# Patient Record
Sex: Female | Born: 1940 | Race: White | Hispanic: No | Marital: Single | Smoking: Former smoker
Health system: Southern US, Community
[De-identification: ages and names within clinical notes are randomized; demographics above are authoritative.]

## PROBLEM LIST (undated history)

## (undated) DIAGNOSIS — I10 Essential (primary) hypertension: Secondary | ICD-10-CM

## (undated) DIAGNOSIS — E039 Hypothyroidism, unspecified: Secondary | ICD-10-CM

## (undated) DIAGNOSIS — J449 Chronic obstructive pulmonary disease, unspecified: Secondary | ICD-10-CM

## (undated) DIAGNOSIS — E119 Type 2 diabetes mellitus without complications: Secondary | ICD-10-CM

## (undated) DIAGNOSIS — I480 Paroxysmal atrial fibrillation: Secondary | ICD-10-CM

## (undated) DIAGNOSIS — I5032 Chronic diastolic (congestive) heart failure: Secondary | ICD-10-CM

## (undated) DIAGNOSIS — E785 Hyperlipidemia, unspecified: Secondary | ICD-10-CM

## (undated) DIAGNOSIS — I272 Pulmonary hypertension, unspecified: Secondary | ICD-10-CM

## (undated) HISTORY — PX: CHOLECYSTECTOMY: SHX55

## (undated) HISTORY — PX: APPENDECTOMY: SHX54

---

## 2006-04-21 ENCOUNTER — Inpatient Hospital Stay: Payer: Self-pay | Admitting: Internal Medicine

## 2006-04-21 ENCOUNTER — Other Ambulatory Visit: Payer: Self-pay

## 2006-04-22 ENCOUNTER — Other Ambulatory Visit: Payer: Self-pay

## 2007-07-22 ENCOUNTER — Other Ambulatory Visit: Payer: Self-pay

## 2007-07-22 ENCOUNTER — Inpatient Hospital Stay: Payer: Self-pay | Admitting: Internal Medicine

## 2007-08-03 ENCOUNTER — Ambulatory Visit: Payer: Self-pay | Admitting: Family Medicine

## 2010-09-10 ENCOUNTER — Emergency Department: Payer: Self-pay | Admitting: Emergency Medicine

## 2011-11-03 ENCOUNTER — Inpatient Hospital Stay: Payer: Self-pay | Admitting: Specialist

## 2011-11-03 LAB — T4, FREE: Free Thyroxine: 1.24 ng/dL (ref 0.76–1.46)

## 2011-11-03 LAB — COMPREHENSIVE METABOLIC PANEL
Albumin: 3 g/dL — ABNORMAL LOW (ref 3.4–5.0)
Anion Gap: 7 (ref 7–16)
Calcium, Total: 8.2 mg/dL — ABNORMAL LOW (ref 8.5–10.1)
Co2: 33 mmol/L — ABNORMAL HIGH (ref 21–32)
Creatinine: 0.86 mg/dL (ref 0.60–1.30)
EGFR (African American): >60
Glucose: 122 mg/dL — ABNORMAL HIGH (ref 65–99)
SGOT(AST): 19 U/L (ref 15–37)
SGPT (ALT): 14 U/L

## 2011-11-03 LAB — CBC
MCH: 28.5 pg (ref 26.0–34.0)
MCHC: 31.6 g/dL — ABNORMAL LOW (ref 32.0–36.0)
RBC: 5.21 10*6/uL — ABNORMAL HIGH (ref 3.80–5.20)
RDW: 15 % — ABNORMAL HIGH (ref 11.5–14.5)
WBC: 5.8 10*3/uL (ref 3.6–11.0)

## 2011-11-03 LAB — PRO B NATRIURETIC PEPTIDE: B-Type Natriuretic Peptide: 2663 pg/mL — ABNORMAL HIGH (ref 0–125)

## 2011-11-03 LAB — TROPONIN I: Troponin-I: <0.02 ng/mL

## 2011-11-03 LAB — TSH: Thyroid Stimulating Horm: 0.209 u[IU]/mL — ABNORMAL LOW

## 2011-11-04 LAB — BASIC METABOLIC PANEL
Anion Gap: 4 — ABNORMAL LOW (ref 7–16)
Calcium, Total: 8.3 mg/dL — ABNORMAL LOW (ref 8.5–10.1)
EGFR (African American): >60
EGFR (Non-African Amer.): >60
Glucose: 136 mg/dL — ABNORMAL HIGH (ref 65–99)
Osmolality: 284 (ref 275–301)
Potassium: 4.3 mmol/L (ref 3.5–5.1)
Sodium: 142 mmol/L (ref 136–145)

## 2011-11-04 LAB — CK TOTAL AND CKMB (NOT AT ARMC)
CK, Total: 33 U/L (ref 21–215)
CK, Total: 41 U/L (ref 21–215)
CK-MB: 1.5 ng/mL (ref 0.5–3.6)
CK-MB: 1.5 ng/mL (ref 0.5–3.6)

## 2011-11-04 LAB — TROPONIN I: Troponin-I: <0.02 ng/mL

## 2011-11-05 LAB — BASIC METABOLIC PANEL
BUN: 15 mg/dL (ref 7–18)
Calcium, Total: 8.4 mg/dL — ABNORMAL LOW (ref 8.5–10.1)
Chloride: 98 mmol/L (ref 98–107)
Co2: 39 mmol/L — ABNORMAL HIGH (ref 21–32)
EGFR (Non-African Amer.): >60

## 2011-11-09 LAB — CULTURE, BLOOD (SINGLE)

## 2012-09-28 ENCOUNTER — Ambulatory Visit: Payer: Self-pay | Admitting: Family Medicine

## 2013-07-24 ENCOUNTER — Inpatient Hospital Stay: Payer: Self-pay | Admitting: Orthopedic Surgery

## 2013-07-24 LAB — CBC
HCT: 38.6 % (ref 35.0–47.0)
HGB: 11.9 g/dL — ABNORMAL LOW (ref 12.0–16.0)
MCH: 26.6 pg (ref 26.0–34.0)
MCHC: 31 g/dL — ABNORMAL LOW (ref 32.0–36.0)
MCV: 86 fL (ref 80–100)
Platelet: 274 10*3/uL (ref 150–440)
RBC: 4.49 10*6/uL (ref 3.80–5.20)
RDW: 14.1 % (ref 11.5–14.5)
WBC: 10.3 10*3/uL (ref 3.6–11.0)

## 2013-07-24 LAB — COMPREHENSIVE METABOLIC PANEL
ALT: 15 U/L (ref 12–78)
ANION GAP: 2 — AB (ref 7–16)
AST: 22 U/L (ref 15–37)
Albumin: 2.8 g/dL — ABNORMAL LOW (ref 3.4–5.0)
Alkaline Phosphatase: 69 U/L
BILIRUBIN TOTAL: 0.6 mg/dL (ref 0.2–1.0)
BUN: 8 mg/dL (ref 7–18)
CHLORIDE: 104 mmol/L (ref 98–107)
CO2: 34 mmol/L — AB (ref 21–32)
Calcium, Total: 8.3 mg/dL — ABNORMAL LOW (ref 8.5–10.1)
Creatinine: 0.76 mg/dL (ref 0.60–1.30)
EGFR (Non-African Amer.): >60
GLUCOSE: 138 mg/dL — AB (ref 65–99)
OSMOLALITY: 280 (ref 275–301)
Potassium: 3.7 mmol/L (ref 3.5–5.1)
Sodium: 140 mmol/L (ref 136–145)
TOTAL PROTEIN: 6.3 g/dL — AB (ref 6.4–8.2)

## 2013-07-24 LAB — URINALYSIS, COMPLETE
Bacteria: NONE SEEN
Bilirubin,UR: NEGATIVE
Blood: NEGATIVE
Glucose,UR: NEGATIVE mg/dL (ref 0–75)
Hyaline Cast: 6
LEUKOCYTE ESTERASE: NEGATIVE
Nitrite: NEGATIVE
Ph: 6 (ref 4.5–8.0)
Protein: 30
RBC,UR: <1 /HPF (ref 0–5)
SPECIFIC GRAVITY: 1.01 (ref 1.003–1.030)
Squamous Epithelial: NONE SEEN
WBC UR: <1 /HPF (ref 0–5)

## 2013-07-24 LAB — PROTIME-INR
INR: 1.2
PROTHROMBIN TIME: 15.1 s — AB (ref 11.5–14.7)

## 2013-07-24 LAB — APTT: ACTIVATED PTT: 28.1 s (ref 23.6–35.9)

## 2013-07-26 LAB — CBC WITH DIFFERENTIAL/PLATELET
BASOS ABS: 0 10*3/uL (ref 0.0–0.1)
Basophil %: 0.1 %
EOS ABS: 0 10*3/uL (ref 0.0–0.7)
EOS PCT: 0 %
HCT: 26.4 % — AB (ref 35.0–47.0)
HGB: 8.1 g/dL — AB (ref 12.0–16.0)
LYMPHS PCT: 9.5 %
Lymphocyte #: 0.5 10*3/uL — ABNORMAL LOW (ref 1.0–3.6)
MCH: 26.6 pg (ref 26.0–34.0)
MCHC: 30.7 g/dL — ABNORMAL LOW (ref 32.0–36.0)
MCV: 87 fL (ref 80–100)
MONO ABS: 0.3 x10 3/mm (ref 0.2–0.9)
Monocyte %: 4.7 %
NEUTROS ABS: 4.9 10*3/uL (ref 1.4–6.5)
NEUTROS PCT: 85.7 %
Platelet: 167 10*3/uL (ref 150–440)
RBC: 3.05 10*6/uL — ABNORMAL LOW (ref 3.80–5.20)
RDW: 13.9 % (ref 11.5–14.5)
WBC: 5.7 10*3/uL (ref 3.6–11.0)

## 2013-07-26 LAB — HEMOGLOBIN: HGB: 8.9 g/dL — ABNORMAL LOW (ref 12.0–16.0)

## 2013-07-26 LAB — BASIC METABOLIC PANEL
Anion Gap: 3 — ABNORMAL LOW (ref 7–16)
BUN: 11 mg/dL (ref 7–18)
CO2: 30 mmol/L (ref 21–32)
Calcium, Total: 7.3 mg/dL — ABNORMAL LOW (ref 8.5–10.1)
Chloride: 112 mmol/L — ABNORMAL HIGH (ref 98–107)
Creatinine: 0.59 mg/dL — ABNORMAL LOW (ref 0.60–1.30)
EGFR (African American): >60
EGFR (Non-African Amer.): >60
GLUCOSE: 158 mg/dL — AB (ref 65–99)
OSMOLALITY: 291 (ref 275–301)
Potassium: 4.9 mmol/L (ref 3.5–5.1)
Sodium: 145 mmol/L (ref 136–145)

## 2013-07-27 LAB — BASIC METABOLIC PANEL
Anion Gap: 1 — ABNORMAL LOW (ref 7–16)
BUN: 15 mg/dL (ref 7–18)
CALCIUM: 7.4 mg/dL — AB (ref 8.5–10.1)
CHLORIDE: 114 mmol/L — AB (ref 98–107)
Co2: 29 mmol/L (ref 21–32)
Creatinine: 0.59 mg/dL — ABNORMAL LOW (ref 0.60–1.30)
EGFR (Non-African Amer.): >60
Glucose: 89 mg/dL (ref 65–99)
OSMOLALITY: 287 (ref 275–301)
POTASSIUM: 4.8 mmol/L (ref 3.5–5.1)
SODIUM: 144 mmol/L (ref 136–145)

## 2013-07-27 LAB — CBC WITH DIFFERENTIAL/PLATELET
BASOS ABS: 0 10*3/uL (ref 0.0–0.1)
Basophil %: 0.2 %
EOS PCT: 0.1 %
Eosinophil #: 0 10*3/uL (ref 0.0–0.7)
HCT: 24.7 % — AB (ref 35.0–47.0)
HGB: 7.9 g/dL — ABNORMAL LOW (ref 12.0–16.0)
LYMPHS PCT: 16.9 %
Lymphocyte #: 1 10*3/uL (ref 1.0–3.6)
MCH: 27.2 pg (ref 26.0–34.0)
MCHC: 31.9 g/dL — AB (ref 32.0–36.0)
MCV: 85 fL (ref 80–100)
Monocyte #: 0.5 x10 3/mm (ref 0.2–0.9)
Monocyte %: 8 %
Neutrophil #: 4.5 10*3/uL (ref 1.4–6.5)
Neutrophil %: 74.8 %
PLATELETS: 159 10*3/uL (ref 150–440)
RBC: 2.9 10*6/uL — ABNORMAL LOW (ref 3.80–5.20)
RDW: 14.2 % (ref 11.5–14.5)
WBC: 6 10*3/uL (ref 3.6–11.0)

## 2013-07-28 LAB — CBC WITH DIFFERENTIAL/PLATELET
BASOS PCT: 0.2 %
Basophil #: 0 10*3/uL (ref 0.0–0.1)
EOS PCT: 0.1 %
Eosinophil #: 0 10*3/uL (ref 0.0–0.7)
HCT: 29.3 % — ABNORMAL LOW (ref 35.0–47.0)
HGB: 9.4 g/dL — ABNORMAL LOW (ref 12.0–16.0)
LYMPHS PCT: 14.5 %
Lymphocyte #: 0.9 10*3/uL — ABNORMAL LOW (ref 1.0–3.6)
MCH: 27.7 pg (ref 26.0–34.0)
MCHC: 32.1 g/dL (ref 32.0–36.0)
MCV: 86 fL (ref 80–100)
MONOS PCT: 8.9 %
Monocyte #: 0.5 x10 3/mm (ref 0.2–0.9)
NEUTROS PCT: 76.3 %
Neutrophil #: 4.7 10*3/uL (ref 1.4–6.5)
Platelet: 180 10*3/uL (ref 150–440)
RBC: 3.39 10*6/uL — ABNORMAL LOW (ref 3.80–5.20)
RDW: 14.3 % (ref 11.5–14.5)
WBC: 6.1 10*3/uL (ref 3.6–11.0)

## 2013-07-29 LAB — EXPECTORATED SPUTUM ASSESSMENT W REFEX TO RESP CULTURE

## 2013-12-07 ENCOUNTER — Ambulatory Visit: Payer: Self-pay | Admitting: Family Medicine

## 2014-01-23 ENCOUNTER — Ambulatory Visit: Payer: Self-pay | Admitting: Otolaryngology

## 2014-01-23 LAB — BASIC METABOLIC PANEL
Anion Gap: 3 — ABNORMAL LOW (ref 7–16)
BUN: 13 mg/dL (ref 7–18)
CALCIUM: 8.8 mg/dL (ref 8.5–10.1)
CHLORIDE: 105 mmol/L (ref 98–107)
CO2: 33 mmol/L — AB (ref 21–32)
CREATININE: 0.64 mg/dL (ref 0.60–1.30)
EGFR (African American): >60
Glucose: 146 mg/dL — ABNORMAL HIGH (ref 65–99)
Osmolality: 284 (ref 275–301)
Potassium: 3.7 mmol/L (ref 3.5–5.1)
SODIUM: 141 mmol/L (ref 136–145)

## 2014-02-05 ENCOUNTER — Ambulatory Visit: Payer: Self-pay | Admitting: Otolaryngology

## 2014-02-05 LAB — CBC WITH DIFFERENTIAL/PLATELET
Basophil #: 0 10*3/uL (ref 0.0–0.1)
Basophil %: 0.4 %
EOS ABS: 0.1 10*3/uL (ref 0.0–0.7)
EOS PCT: 0.7 %
HCT: 33.5 % — ABNORMAL LOW (ref 35.0–47.0)
HGB: 10.7 g/dL — ABNORMAL LOW (ref 12.0–16.0)
LYMPHS ABS: 2.1 10*3/uL (ref 1.0–3.6)
LYMPHS PCT: 23.8 %
MCH: 27.9 pg (ref 26.0–34.0)
MCHC: 32 g/dL (ref 32.0–36.0)
MCV: 87 fL (ref 80–100)
Monocyte #: 0.5 x10 3/mm (ref 0.2–0.9)
Monocyte %: 6 %
Neutrophil #: 6.1 10*3/uL (ref 1.4–6.5)
Neutrophil %: 69.1 %
Platelet: 203 10*3/uL (ref 150–440)
RBC: 3.85 10*6/uL (ref 3.80–5.20)
RDW: 12.5 % (ref 11.5–14.5)
WBC: 8.8 10*3/uL (ref 3.6–11.0)

## 2014-02-05 LAB — BASIC METABOLIC PANEL
ANION GAP: 4 — AB (ref 7–16)
BUN: 18 mg/dL (ref 7–18)
CALCIUM: 7.9 mg/dL — AB (ref 8.5–10.1)
Chloride: 113 mmol/L — ABNORMAL HIGH (ref 98–107)
Co2: 29 mmol/L (ref 21–32)
Creatinine: 0.77 mg/dL (ref 0.60–1.30)
EGFR (Non-African Amer.): >60
Glucose: 157 mg/dL — ABNORMAL HIGH (ref 65–99)
Osmolality: 296 (ref 275–301)
Potassium: 4.4 mmol/L (ref 3.5–5.1)
Sodium: 146 mmol/L — ABNORMAL HIGH (ref 136–145)

## 2014-02-06 LAB — CBC WITH DIFFERENTIAL/PLATELET
Basophil #: 0 10*3/uL (ref 0.0–0.1)
Basophil %: 0.6 %
Eosinophil #: 0 10*3/uL (ref 0.0–0.7)
Eosinophil %: 0.5 %
HCT: 30 % — ABNORMAL LOW (ref 35.0–47.0)
HGB: 9.8 g/dL — ABNORMAL LOW (ref 12.0–16.0)
Lymphocyte #: 1.4 10*3/uL (ref 1.0–3.6)
Lymphocyte %: 18.1 %
MCH: 28.3 pg (ref 26.0–34.0)
MCHC: 32.6 g/dL (ref 32.0–36.0)
MCV: 87 fL (ref 80–100)
Monocyte #: 0.5 x10 3/mm (ref 0.2–0.9)
Monocyte %: 7.1 %
Neutrophil #: 5.5 10*3/uL (ref 1.4–6.5)
Neutrophil %: 73.7 %
Platelet: 196 10*3/uL (ref 150–440)
RBC: 3.45 10*6/uL — ABNORMAL LOW (ref 3.80–5.20)
RDW: 12.2 % (ref 11.5–14.5)
WBC: 7.5 10*3/uL (ref 3.6–11.0)

## 2014-02-06 LAB — CALCIUM: CALCIUM: 7.1 mg/dL — AB (ref 8.5–10.1)

## 2014-04-03 LAB — PATHOLOGY REPORT

## 2014-10-13 NOTE — Discharge Summary (Signed)
PATIENT NAME:  Sheila Hayes, Sheila Hayes MR#:  [id number] DATE OF BIRTH:  1941-04-07  DATE OF ADMISSION:  07/24/2013 DATE OF DISCHARGE: 07/31/2013   ADMITTING DIAGNOSIS: Right supracondylar femur fracture.   DISCHARGE DIAGNOSIS: Right supracondylar femur fracture  OPERATION: On 07/25/2013, she underwent retrograde nail fixation of the right supracondylar distal femur fracture.   SURGEON: Murlean HarkShalini Ramasunder, M.D.   ANESTHESIA: Spinal, and then general anesthesia.   COMPLICATIONS: None.   IMPLANTS: Biomet Trauma Phoenix retrograde nail 10.5 mm x 340 mm.   DISPOSITION: The patient was intubated during the last 1 hour of the case due to her extensive emphysema. She was unable to be immediately extubated and was transferred to the CCU for postoperative care.   HISTORY: Sheila Hayes is a 74 year old female, who on 07/24/2013, fell while walking out of the office for her rental home. She had severe pain. She presented to [facility] the same day where x-rays confirmed a right supracondylar femur fracture. Orthopedics and medicine were consulted. Medicine cleared the patient for surgery.   PHYSICAL EXAMINATION:  GENERAL: Well-developed in no acute distress.  HEENT: Pink conjunctivae. Hearing intact to voice, dry oral mucosa. Poor dentition.  NECK: Supple.  RESPIRATORY: Normal respiratory effort. Clear breath sounds.  CARDIOVASCULAR: Regular rate and rhythm. Lower extremity edema present.  ABDOMEN: No tenderness. No flank tenderness. Soft. Hyperactive bowel sounds.  GENITOURINARY: Clear yellow urine draining.  LYMPHATICS: Negative neck.  EXTREMITIES: Negative cyanosis and clubbing. Positive edema. Right knee with effusion and deformity of the distal femur. An abrasion on the anterior lateral proximal tibia.  SKIN: Normal to palpation. No rashes. Skin turgor poor. Abrasion on the proximal tibia.  NEUROLOGIC: Follows commands. Sensation intact to SPN, DPN, tibial nerve intact. DP bounding. PT +1.   PSYCHIATRIC: Alert and oriented to time, place, person. Good insight.   HOSPITAL COURSE: After initial admission on 07/24/2013, the patient was cleared by medicine and had surgery on 07/25/2013. She was unable to be extubated and was transported to the CCU for recovery. On postoperative day 1, 07/26/2013, she was hypotensive and in the CCU intubated. She was given 2 L normal saline. T-max 99.0. Hemoglobin 8.1 and hematocrit 26.4. Physical therapy was on hold due to patient's critical condition. On postoperative day 2,  07/27/2013, she continued to be in the CCU, hypotensive and intubated. She was given 1 unit of PRBC. Hemoglobin 8.9 and hematocrit 27.4. She had a low urine output. On postoperative day 3, 07/28/2013, she was extubated the previous day. She was on 4 Ls nasal cannula, but still in the CCU. Hemoglobin and hematocrit 9.4 and 29.3. Urine output was improved. On postoperative day 4, 07/29/2013, she was transferred to the floor. She was on 3 L nasal cannula. T-max 99.0. She had a bowel movement. Oxygen saturation was 91% to 99%. Physical therapy was resumed, although she did not tolerate this. On 07/30/2013, postoperative day 5, she was on 2.5 L nasal cannula. She continued physical therapy and had no acute events and she had slow progress with physical therapy. On postoperative day 6, 07/31/2013, she is stable and ready for discharge. Medicine has cleared her for discharge.   CONDITION AT DISCHARGE: Stable.   DISPOSITION: The patient was sent to rehab.   DISCHARGE INSTRUCTIONS: The patient will follow up [facility] orthopedics in 2 weeks for staple removal. She will do physical therapy and it should be nonweightbearing on the right lower extremity. She will have a diabetic diet. TED hose knee-high left lower extremity. She has  a postoperative hinged knee brace, which to be locked in extension at all times. This may be taken off for dressing changes. Her dressing can be changed once daily  on as-needed basis. She also has Polar Care and that should be on the knee at all times. Please see discharge instructions for a complete list of discharge medications.   ____________________________ Shunda Rabadi M. Haskel Khan, NP amb:aw D: 07/31/2013 08:33:57 ET T: 07/31/2013 08:49:21 ET JOB#: [id number]  cc: Lance Huaracha M. Haskel Khan, NP, <Dictator> Burt Ek Makale Pindell FNP ELECTRONICALLY SIGNED 08/21/2013 8:36

## 2014-10-13 NOTE — H&P (Signed)
Subjective/Chief Complaint Right leg pain   History of Present Illness Patient was walking out of the office for her rental home and slipped.  Felt severe pain before she landed on ground.  Presented to ED with severe pain.  Denies other injury.  Denies loss of consciousness.  Denies numbness or tingling.  Smokes 1/2 ppd   Past Med/Surgical Hx:  Diabetes:   Emphysema:   Tonsillectomy and Adenoidectomy:   Cholecystectomy:   Cataract Extraction:   Appendectomy:   ALLERGIES:  No Known Allergies:   HOME MEDICATIONS: Medication Instructions Status  aspirin 81 mg oral tablet 1 tab(s) orally once a day Active  Advair Diskus  inhaled  Active   Family and Social History:  Family History Non-Contributory   Social History lives alone with first floor set up.  Independent ambulator without walker or cane.   Place of Living Home   Review of Systems:  Subjective/Chief Complaint right leg pain   Fever/Chills No   Cough No   Sputum No   Abdominal Pain No   Diarrhea No   Constipation No   Nausea/Vomiting No   SOB/DOE No   Chest Pain No   Dysuria No   Medications/Allergies Reviewed Medications/Allergies reviewed   Physical Exam:  GEN well developed, no acute distress   HEENT pink conjunctivae, hearing intact to voice, dry oral mucosa, poor dentition   NECK supple   RESP normal resp effort  clear BS   CARD regular rate  no murmur  LE edema present   ABD denies tenderness  denies Flank Tenderness  soft  hyperactive BS   GU clear yellow urine draining   LYMPH negative neck   EXTR negative cyanosis/clubbing, positive edema, right knee with effusion, deformity distal femur. Abrasion antero-lateral proximal tibia   SKIN normal to palpation, No rashes, skin turgor poor, abrasion prox tibia   NEURO follows commands, Sensation SPN, DPN, tib nerve intact.  DP bounding.  PT 1+.   PSYCH alert, A+O to time, place, person, good insight   Lab Results:  Hepatic:   02-Feb-15 11:52   Bilirubin, Total 0.6  Alkaline Phosphatase 69 (45-117 NOTE: New Reference Range 05/12/13)  SGPT (ALT) 15  SGOT (AST) 22  Total Protein, Serum  6.3  Albumin, Serum  2.8  Routine BB:  02-Feb-15 11:52   ABO Group + Rh Type -  Antibody Screen -    12:13   ABO Group + Rh Type A Negative  Antibody Screen NEGATIVE (Result(s) reported on 24 Jul 2013 at 01:32PM.)  Routine Chem:  02-Feb-15 11:52   Glucose, Serum  138  BUN 8  Creatinine (comp) 0.76  Sodium, Serum 140  Potassium, Serum 3.7  Chloride, Serum 104  CO2, Serum  34  Calcium (Total), Serum  8.3  Osmolality (calc) 280  eGFR (African American) >60  eGFR (Non-African American) >60 (eGFR values <22mL/min/1.73 m2 may be an indication of chronic kidney disease (CKD). Calculated eGFR is useful in patients with stable renal function. The eGFR calculation will not be reliable in acutely ill patients when serum creatinine is changing rapidly. It is not useful in  patients on dialysis. The eGFR calculation may not be applicable to patients at the low and high extremes of body sizes, pregnant women, and vegetarians.)  Anion Gap  2  Result Comment TYPE AND SCREEN - NO SPECIMEN RECEIVED. REORDERED. MPG  Result(s) reported on 24 Jul 2013 at 12:10PM.  Routine UA:  02-Feb-15 11:52   Color (UA) Yellow  Clarity (  UA) Clear  Glucose (UA) Negative  Bilirubin (UA) Negative  Ketones (UA) Trace  Specific Gravity (UA) 1.010  Blood (UA) Negative  pH (UA) 6.0  Protein (UA) 30 mg/dL  Nitrite (UA) Negative  Leukocyte Esterase (UA) Negative (Result(s) reported on 24 Jul 2013 at 01:14PM.)  RBC (UA) <1 /HPF  WBC (UA) <1 /HPF  Bacteria (UA) NONE SEEN  Epithelial Cells (UA) NONE SEEN  Hyaline Cast (UA) 6 /LPF (Result(s) reported on 24 Jul 2013 at 01:14PM.)  Routine Coag:  02-Feb-15 11:52   Prothrombin  15.1  INR 1.2 (INR reference interval applies to patients on anticoagulant therapy. A single INR therapeutic range for  coumarins is not optimal for all indications; however, the suggested range for most indications is 2.0 - 3.0. Exceptions to the INR Reference Range may include: Prosthetic heart valves, acute myocardial infarction, prevention of myocardial infarction, and combinations of aspirin and anticoagulant. The need for a higher or lower target INR must be assessed individually. Reference: The Pharmacology and Management of the Vitamin K  antagonists: the seventh ACCP Conference on Antithrombotic and Thrombolytic Therapy. TYYPE.9611 Sept:126 (3suppl): [id number]. A HCT value >55% may artifactually increase the PT.  In one study,  the increase was an average of 25%. Reference:  "Effect on Routine and Special Coagulation Testing Values of Citrate Anticoagulant Adjustment in Patients with High HCT Values." American Journal of Clinical Pathology 2006;126:400-405.)  Activated PTT (APTT) 28.1 (A HCT value >55% may artifactually increase the APTT. In one study, the increase was an average of 19%. Reference: "Effect on Routine and Special Coagulation Testing Values of Citrate Anticoagulant Adjustment in Patients with High HCT Values." American Journal of Clinical Pathology 2006;126:400-405.)  Routine Hem:  02-Feb-15 11:52   WBC (CBC) 10.3  RBC (CBC) 4.49  Hemoglobin (CBC)  11.9  Hematocrit (CBC) 38.6  Platelet Count (CBC) 274 (Result(s) reported on 24 Jul 2013 at 12:27PM.)  MCV 86  MCH 26.6  MCHC  31.0  RDW 14.1   Radiology Results:  XRay:    02-Feb-15 10:50, Knee Right Complete  Knee Right Complete  REASON FOR EXAM:    fall, dislocation or fracture  COMMENTS:       PROCEDURE: DXR - DXR KNEE RT COMP WITH OBLIQUES  - Jul 24 2013 10:50AM     CLINICAL DATA:  Status post fall.  Knee pain.    EXAM:  RIGHT KNEE - COMPLETE 4+ VIEW    COMPARISON:  None.    FINDINGS:  Markedly comminuted fracture of the distal femur noted, mainly  involving the distal diaphysis and metaphysis. No definite  extension  of fracture lines to the weight-bearing articular surfaces of thea  femoral condyles is identified. Thefracture line does appear to  extend to the anterior cortex of the distal femur, in the  patellofemoral compartment.    Chronic degenerative changes are seen in the knee with loss of joint  space in the medial compartment.     IMPRESSION:  Markedly comminuted distal femur fracture involving the distal  diaphysis and metaphysis. Fracture line does appear to extend to the  articular surface in the patellofemoral compartment.      Electronically Signed    By: Misty Stanley M.D.    On: 07/24/2013 11:27         Verified By: ERIC A. MANSELL, M.D.,    02-Feb-15 11:26, Chest Portable Single View  Chest Portable Single View  REASON FOR EXAM:    preop  COMMENTS:  PROCEDURE: DXR - DXR PORTABLE CHEST SINGLE VIEW  - Jul 24 2013 11:26AM     CLINICAL DATA:  Right lower leg pain after falling.    EXAM:  PORTABLE CHEST - 1 VIEW    COMPARISON:  11/03/2011.    FINDINGS:  1107hr. The positioning is more lordotic. Allowing for this, the  heart size and mediastinal contours are stable. The heart is mildly  enlarged. The lungs are clear. There is no pleural effusion or  pneumothorax. No acute osseous findings are seen.     IMPRESSION:  No active cardiopulmonary process.      Electronically Signed    By: Camie Patience M.D.    On: 07/24/2013 11:34         Verified By: Vivia Ewing, M.D.,    02-Feb-15 11:26, Femur Right  Femur Right  REASON FOR EXAM:    fall, twisted knee, non weight bearing  COMMENTS:       PROCEDURE: DXR - DXR FEMUR RIGHT  - Jul 24 2013 11:26AM     CLINICAL DATA:  Fall with pain.    EXAM:  RIGHT FEMUR - 2 VIEW    COMPARISON:  None.    FINDINGS:  Two view exam of the right femur shows a markedly comminuted distal  femur fracture involving the distal diaphysis with extension into  the metaphysis. There is apex posterior angulation.  Bones are  diffusely demineralized. Degenerative changes are noted at the knee.     IMPRESSION:  Comminuted distal femur fracture with apex posterior angulation.      Electronically Signed    By: Misty Stanley M.D.    On: 07/24/2013 11:32         Verified By: ERIC A. MANSELL, M.D.,    Assessment/Admission Diagnosis Distal femur fracture, severely comminuted and displaced.   Plan CT scan knee pending. Based on whether there is an articular component, will decide on IM nail vs. plate. Risks of surgery explained in detail to patient. NWB NPO after midnight. Plan for surgery tomorrow.   Electronic Signatures: Dawayne Patricia (MD)  (Signed [phone] 14:15)  Authored: CHIEF COMPLAINT and HISTORY, PAST MEDICAL/SURGIAL HISTORY, ALLERGIES, HOME MEDICATIONS, FAMILY AND SOCIAL HISTORY, REVIEW OF SYSTEMS, PHYSICAL EXAM, LABS, Radiology, ASSESSMENT AND PLAN   Last Updated: 02-Feb-15 14:15 by Dawayne Patricia (MD)

## 2014-10-13 NOTE — Op Note (Signed)
PATIENT NAME:  Sheila ModenaWHITFIELD, Harvest F MR#:  [id number] DATE OF BIRTH:  Nov 07, 1940  DATE OF PROCEDURE:  07/25/2013   PREOPERATIVE DIAGNOSIS: Right distal femur fracture, supracondylar, severely comminuted.   POSTOPERATIVE DIAGNOSIS:  Right distal femur fracture, supracondylar, severely comminuted.  PROCEDURE PERFORMED: Retrograde nail fixation, right supracondylar distal femur fracture.   SURGEON: Murlean HarkShalini Havana Baldwin, M.D.   ASSISTANT: None.   ANESTHESIA: Spinal anesthesia followed by general anesthesia.  SURGICAL FINDINGS: The patient is found to have severely osteoporotic bone. Supracondylar fracture was very severely comminuted. Lateral cortex was extensively comminuted with minimal bone integrity. 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   COMPLICATIONS: None.  IMPLANTS USED: Biomet Trauma Phoenix retrograde nail 10.5 mm x 340 mm.   DISPOSITION: The patient was intubated during the last 1 hour of the case.  Due to her extensive emphysema, she was unable to be immediately extubated and was therefore returned to the PCU for postoperative care.   INDICATIONS FOR PROCEDURE: Ms. Sheila Hayes is a 74 year old female. She lives independently. However, she states that she does not use any assistive device for walking. Per discussion with her family, they state that she was advised to use a walker, in the past, however, has not done so.  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 She states that she felt her leg break before she even fell to the ground. She was brought to the Emergency Room where she was admitted to the orthopedic service for care. Severely comminuted supracondylar distal femur fracture was noted. Decision was made to proceed with surgical intervention.  The patient was identified in the preoperative holding area. Spinal anesthesia was administered. Right lower extremity was marked as the operative site. The patient was brought  in the operating room and placed on the table in supine position. The right lower extremity was prepared and draped in the usual sterile fashion. With fluoroscopic assistance, the fracture was reduced. There is extensive comminution. Using a radiolucent triangle and a towel bump, adequate alignment of the fracture was able to be achieved. An anterior incision was made. Deep dissection is carried down using a standard medial parapatellar approach. The patellar tendon was retracted medially. This was protected throughout the case using a soft tissue protector. With fluoroscopic guidance, starting pin was inserted in the midline of the distal femur in line with Blumensaat's line. This was then overdrilled with an opening reamer using a soft tissue protector. Ball-tipped guidewire was inserted past the fracture line. A blocking screw was inserted in a distal fragment anterior to expected placement of the nail in order to prevent the distal fragment from falling into extension. The patient was found to have minimal lateral bone available. Hope was to be able to remove this blocking screw as there was minimal cortical fixation after nail placement was carried out and locked.  The femur was sequentially reamed to 12 mm. Reaming was carried out across the fracture site under direct fluoroscopic visualization to ensure that the canal is being centrally reamed given the severe instability of the fracture. At this time the femoral canal was measured to be 355 mm and, therefore, a decision was made to use a 340 mm nail. A 340 mm x 10.5 mm Phoenix retrograde nail was inserted. Using the distal screw insertion guide, 4 distal interlocking screws were placed in the distal fragment of the fracture to ensure as much stability as possible. With these 4 screws in place,  the blocking screw was able to be removed. Fracture alignment remained preserved with blocking screw removed. Decision was made to remove the blocking screw given  the lack of adequate cortical bite.   Attention was now turned to the proximal aspect of the femur. Using a standard perfect circle technique, an anterior to posterior proximal femoral interlocking screw was placed. This was confirmed on orthogonal imaging. At this time, final films were taken throughout the lower extremity. Adequate alignment was found. Leg lengths were clinically equal. Nail was well seated inside the distal femur and not prominent on palpation or on motion. All incisions were copiously irrigated. The retinaculum was approximated using 0 Vicryl suture. Subcutaneous tissue was closed using 2-0 Vicryl suture.  Skin was closed using staples. The patient was placed in a hinged knee brace locked in extension. She will be kept in extension for 2 weeks and then gentle range of motion in the brace will be started. The patient will be nonweightbearing.    ____________________________ Murlean Hark, MD sr:ce D: 07/28/2013 15:47:46 ET T: 07/28/2013 18:35:17 ET JOB#: [id number]  cc: Murlean Hark, MD, <Dictator> Murlean Hark MD ELECTRONICALLY SIGNED 08/24/2013 8:49

## 2014-10-13 NOTE — Op Note (Signed)
PATIENT NAME:  Sheila Hayes, Sheila Hayes MR#:  [id number] DATE OF BIRTH:  December 02, 1940  DATE OF PROCEDURE:  02/05/2014  PREOPERATIVE DIAGNOSES: 1.  Multinodular goiter. 2.  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   POSTOPERATIVE DIAGNOSES:  1.  Multinodular goiter. 2.  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   PROCEDURE PERFORMED: Total thyroidectomy with recurrent laryngeal nerve monitoring throughout the procedure.   SURGEON: Vernie Murders, MD  ASSISTANT: Linus Salmons, MD   ANESTHESIA: General.   COMPLICATIONS: None.   TOTAL ESTIMATED BLOOD LOSS: 750 mL.   DESCRIPTION OF PROCEDURE: The patient was given general anesthesia by oral endotracheal intubation. Several electrodes were placed on the oral endotracheal tube for monitoring of the recurrent laryngeal nerve. The electrodes were placed under direct visualization to give good contact on the vocal cords. Once she was intubated, a shoulder roll was placed and her neck was extended. She had an extremely large gland that was pushing out anteriorly in the neck, very wide. The left gland crossed midline and was approximately 7 inches in size on the CT scan. There were multiple nodules throughout and calcified areas throughout. Her neck was marked and then 1% Xylocaine with epinephrine 1:100,000 was infiltrated in the subcutaneous tissues; approximately 8 mL was used. She was prepped and draped in sterile fashion.   Incision was created through the skin and subcutaneous and down through the platysmal layer. There were 2 large anterior jugular veins that were crossclamped and tied near the midline. Strap muscles were divided in the midline and elevated and there was 1 large mass that looked like in the middle of the neck, however, this was part of the left thyroid gland. The strap muscles were pulled up, but I could not get around the gland well because  it was so large. The strap muscles were already stretched. The left strap muscles were divided to provide better visualization around the bottom of the large nodule. With finger dissection the capsule of the thyroid gland was freed up inferiorly as it went below the clavicle and then laterally and superiorly. Almost the entire left gland was this large nodule. There was very little normal gland left superiorly. The strap muscle was pulled on the right side to get around the front edge of the thyroid gland and we were able to deliver the entire large mass out the neck wound still with its attachments into the bottom of the neck. Inferiorly the vessels were cut with the Harmonic scalpel. This freed up the mass at the bottom of the trachea. Blunt dissection was used and the recurrent laryngeal nerve was found on the left side. Directly overlying it was a large parathyroid gland that appeared to be about 1 gram in size. I did not see another parathyroid gland on this side. The nerve was verified by stimulation and was tracked superiorly. Once this was freed up and I could see it lying in the bed, Berry's ligament was cut across and then the remaining vascular attachments superiorly and a small amount of normal thyroid gland was removed. The vessels were crossclamped and tied. There was a very large vessel coming in medially overlying the larynx and this was crossclamped and tied as well.   The right side was then visualized. There were several nodules that were overlying this and freed up. The recurrent laryngeal nerve was then found as the gland was rotated medially. It was deep in the bed,  appeared to be a parathyroid inferiorly, that was saved on this side as well to save both inferior parathyroid glands. The recurrent laryngeal nerve was traced up into the larynx and Berry's ligament and vessels were cut above this and the remaining attachments to the trachea were freed up from the gland. The gland and large mass  of nodules was tagged in the right superior pole for orientation in pathology.    The wound was copiously irrigated. On both sides there were small areas of bleeding that were cauterized. There was no significant bleeding then noted on either side. The trachea was now lying in the midline as it was no longer being pushed way off to the right side. Then two 7 mm Jackson-Pratt drains were placed, one on each side, crisscrossing in the midline. These were sutured to the skin with 3-0 nylon suture. The strap muscles were then repaired on the right side and then they were sutured in the midline to help close the deep layer. This was all done with interrupted 4-0 Vicryls. The platysmal layer was then closed with interrupted 4-0 Vicryls as well. The subcu was then closed with 4-0 Vicryls and the skin edges were held in apposition with a 6-0 nylon suture. Drains were placed to low continuous bulb suction. There was no significant bleeding anymore. There was some significant bleeding during the procedure while the large mass was being delivered and there were still multiple blood supply attachments. This total amount of blood loss was 750 mL, but the wound was nice and dry at the end of the procedure. She had 2 parathyroids that were left intact and the recurrent laryngeal nerves were stimulated at the end of case and had good function bilaterally.   The patient tolerated the procedure well. She was awakened and taken to the recovery room in satisfactory condition. There were no operative complications. This was an extremely large gland with a very large wound to be able to deliver it whole and have good visualization.  ____________________________ Sheila CopaPaul H. Jaquavius Hudler, MD phj:sb D: 02/05/2014 10:36:54 ET T: 02/05/2014 11:31:22 ET JOB#: [id number]  cc: Sheila CopaPaul H. Kella Splinter, MD, <Dictator> Sheila CopaPAUL H Ghazal Pevey MD ELECTRONICALLY SIGNED 02/08/2014 12:10

## 2014-10-13 NOTE — Consult Note (Signed)
PATIENT NAME:  Sheila Hayes, Sheila Hayes MR#:  [id number] DATE OF BIRTH:  [birth year]  DATE OF CONSULTATION:  07/24/2013  REFERRING PHYSICIAN:  Murlean HarkShalini Ramasunder, MD CONSULTING PHYSICIAN:  Shaune PollackQing Christne Platts, MD  PRIMARY CARE PHYSICIAN: Dr. Wallene Huharew.  REASON FOR CONSULTATION: Medical management.   HISTORY OF PRESENT ILLNESS: This is a 74 year old Caucasian female with a history of hypertension, obesity, CHF, diabetes, COPD, who was sent to ED due to fall today. The patient fell by accident at home and then developed right hip severe pain, unable to stand or move right lower extremity. The patient was diagnosed with right hip fracture. The patient denies any headache or dizziness. Denies any other symptoms. Denies any syncope, loss of consciousness.   PAST MEDICAL HISTORY: Hypertension, diabetes, congestive heart failure, obesity, COPD.   PAST SURGICAL HISTORY: Appendectomy, cholecystectomy, tonsillectomy,   FAMILY HISTORY: Both parents died of a heart attack.   REVIEW OF SYSTEMS:    CONSTITUTIONAL: The patient denies any fever or chills. No headache or dizziness. No weakness.  EYES: No double vision or blurry vision.  EARS, NOSE, THROAT: No postnasal drip, slurred speech, or dysphagia.  CARDIOVASCULAR: No chest pain, palpitations, orthopnea, or nocturnal dyspnea. No leg edema.  PULMONARY: No cough, sputum, shortness of breath or hemoptysis.  GASTROINTESTINAL: No abdominal pain, nausea, vomiting or diarrhea. No melena or bloody stool.  GENITOURINARY: No dysuria, hematuria or incontinence.  SKIN: No rash or jaundice.  NEUROLOGIC: No syncope, loss of consciousness or seizure.  HEMATOLOGIC: No easy bruising or bleeding.  ENDOCRINE: No polyuria, polydipsia, heat or cold tolerance.  MUSCULOSKELETAL: No edema, but has right hip pain.   ALLERGIES: No.  HOME MEDICATIONS:  1.  Aspirin 81 mg p.o. daily.  2.  Advair Diskus inhaled.   PHYSICAL EXAMINATION: VITAL SIGNS: Temperature 98.1, blood pressure 164/74,  pulse 99, O2 saturation 90% on oxygen.  GENERAL: The patient is alert, awake, mildly confused, possibly due to pain medication just given.  HEENT: Pupils round, equal, reactive to light and accommodation.  NECK: Supple. No JVD or carotid bruit. No lymphadenopathy. No thyromegaly.  CARDIOVASCULAR: S1, S2, regular rate and rhythm. No murmurs or gallops.  PULMONARY: Bilateral air entry. No wheezing or rales. No use of accessory muscles to breathe.  ABDOMEN: Soft. No distention or tenderness. No organomegaly. Bowel sounds present.  EXTREMITIES: No edema, clubbing or cyanosis. No calf tenderness. Right lower extremity deformity, on fixation.  SKIN: No rash or jaundice.  NEUROLOGIC: The patient is alert, awake, confused, unable to examine at this time.   LABORATORY AND RADIOLOGICAL DATA: CT of the right knee without contrast showed severe comminuted fracture of the distal femoral diaphysis.   Glucose 138, BUN 8, creatinine 0.76. Electrolytes are normal. WBC 10.3, hemoglobin 11.9, platelets 274. INR 1.2. Urinalysis is negative.   Chest x-ray: No acute cardiopulmonary process.   Right femur x-ray shows distal femur fracture.   IMPRESSIONS: 1.  Right hip fracture.  2.  Hypertension.  3.  Diabetes.  4.  Chronic obstructive pulmonary disease, emphysema.  5.  History of congestive heart failure.  RECOMMENDATIONS:  1.  The patient has moderate risk for hip fracture.  2.  We will recommend to hold aspirin for hip surgery, and then DVT prophylaxis after surgery.  3.  The patient has a history of COPD, emphysema. Needs nebulizer and spirometry.  4.  For diabetes, need to hold metformin and start sliding scale.  5.  Give IV fluid support.  6.  Follow up CBC.  7.  I discussed the patient's condition and plan of treatment with the patient.  CODE STATUS: The patient is a full code.   TIME SPENT: About 52 minutes.   ____________________________ Shaune Pollack, MD qc:jcm D: 07/24/2013 17:37:33  ET T: 07/24/2013 18:55:18 ET JOB#: [id number]  cc: Shaune Pollack, MD, <Dictator> Shaune Pollack MD ELECTRONICALLY SIGNED 07/26/2013 15:12

## 2014-10-14 NOTE — Consult Note (Signed)
Brief Consult Note: Diagnosis: SOB, likely combination of COPD/CHF and possible infectious process.   Comments: Patient with SOB, coughing, wheezing, PND, increased pedal edema. Had "cold" prior to symptom onset and h/o smoking. She has improved with diurectics, antibiotics. She is on Coreg, ACE-I, Lasix at this time. There has not been any CP or acute EKG changes and will await echo results and make further recommendations.  Electronic Signatures: Radene KneeKhan, Shaukat Ali (MD)   (Signed 16-May-13 09:00)  Co-Signer: Brief Consult Note Maia PlanManzi, Kaenan Jake A (PA-C)   (Signed 15-May-13 12:25)  Authored: Brief Consult Note  Last Updated: 16-May-13 09:00 by Radene KneeKhan, Shaukat Ali (MD)

## 2014-10-14 NOTE — Discharge Summary (Signed)
PATIENT NAME:  Sheila Hayes, Sheila Hayes MR#:  [id number] DATE OF BIRTH:  1941/03/13  DATE OF ADMISSION:  11/03/2011 DATE OF DISCHARGE:  11/05/2011  HISTORY AND PHYSICAL: For a detailed note, please take a look at the History and Physical done on admission by Dr. Enedina Finner.   DISCHARGE DIAGNOSES:  1. Acute respiratory failure likely secondary to a combination of chronic obstructive pulmonary disease and congestive heart failure.  2. Chronic obstructive pulmonary disease exacerbation secondary to ongoing tobacco abuse and acute bronchitis. 3. Congestive heart failure, acute on chronic diastolic dysfunction.  4. Diabetes.  5. Obesity.  6. Hypertension.  7. Medical noncompliance.   DIET: The patient is being discharged on a low-sodium, American Diabetic Association low-fat diet.   ACTIVITY: As tolerated.   FOLLOWUP:  1. Follow up with Dr. Adrian Blackwater in the next 1 to 2 weeks.  2. Follow up with Dr. Elizabeth Sauer in the next 1 to 2 weeks.    DISCHARGE MEDICATIONS:  1. Aspirin 81 mg daily.  2. Coreg 3.125 mg p.o. b.i.d.  3. Lisinopril 5 mg daily.  4. Lasix 20 mg b.i.d.  5. Potassium 20 mEq daily.  6. Prednisone taper starting at 50 mg, down to 10 mg over the next 5 days.  7. Levaquin 5 mg daily x5 days.  8. Metformin 500 mg b.i.d.  CONSULTANT: Dr. Adrian Blackwater from Cardiology.   PERTINENT HOSPITAL LABORATORY, DIAGNOSTIC AND RADIOLOGICAL DATA: Chest x-ray done on admission showing heterogeneous left lower lobe opacities, may represent infection. Small left pleural effusion.  Two-dimensional echocardiogram showing dilated left atrium, rest of the chambers normal size, mild MR and mild pulmonary hypertension. Ejection fraction 55%.   HOSPITAL COURSE: The patient is a 74 year old female with medical problems as mentioned above, presented to the hospital on 11/03/2011 secondary to acute respiratory failure and shortness of breath.   1. Acute respiratory failure: This was acute hypoxic  hypercarbic respiratory failure. This was likely secondary to a combination of underlying chronic obstructive pulmonary disease and congestive heart failure. The patient was started on IV Solu-Medrol, empiric antibiotics, also started on IV diuretics. After receiving two days of IV diuretic therapy and IV steroids, the patient's clinical symptoms have improved. The patient is about 8 liters negative since admission. She was ambulated on room air and did need home oxygen and was discharged on that.  2. Chronic obstructive pulmonary disease exacerbation: This was also the cause of the patient's respiratory failure. The patient was treated with IV steroids, around-the-clock nebulizer treatments, and empiric Levaquin. She currently is being discharged on a prednisone taper and Levaquin for a few days as stated. She was strongly advised to quit smoking and was maintained on a nicotine patch while she was here.  3. Congestive heart failure: This was likely acute on chronic diastolic dysfunction. The patient's echocardiogram showed an ejection fraction of 55%. She has underlying smoking and chronic obstructive pulmonary disease, therefore, has mild pulmonary hypertension on the echocardiogram too. She was diuresed well with IV Lasix. She is about 8 liters negative since admission. She clinically feels much better. Presently she is being discharged on p.o. beta blockers, ACE inhibitors, and low-dose diuretics and potassium supplements as mentioned.  4. Diabetes: The patient was noncompliant with her meds. Hemoglobin A1c was as high as 6.5. She was started on some metformin and placed on sliding scale insulin. For now, she is being discharged on just metformin with further titrations to be done as an outpatient.  5. Chronic obstructive pulmonary  disease: As mentioned, the patient did have a mild exacerbation due to acute bronchitis which was treated with IV steroids and empiric antibiotics. She did qualify for home  oxygen, which is being arranged for her.   CODE STATUS: The patient is a FULL CODE.  DISPOSITION: She is being discharged home with Home Health nursing and physical therapy services.   TIME SPENT: 40 minutes.  ____________________________ Rolly PancakeVivek J. Cherlynn KaiserSainani, MD vjs:cbb D: 11/05/2011 14:42:56 ET T: 11/06/2011 10:22:45 ET JOB#: [id number]  cc: Rolly PancakeVivek J. Cherlynn KaiserSainani, MD, <Dictator> Laurier NancyShaukat A. Khan, MD Duanne Limerickeanna C. Jones, MD Houston SirenVIVEK J Mishawn Didion MD ELECTRONICALLY SIGNED 11/12/2011 15:12

## 2014-10-14 NOTE — Consult Note (Signed)
PATIENT NAME:  Sheila ModenaWHITFIELD, Ala F MR#:  [id number] DATE OF BIRTH:  [birth year]  DATE OF CONSULTATION:  11/04/2011  REFERRING PHYSICIAN:  Enedina FinnerSona Patel, MD   CONSULTING PHYSICIAN:  Adrian BlackwaterShaukat Khan, MD/Isel Skufca Eloy EndA. Alley Neils, PA-C  PRIMARY CARE PHYSICIAN: Elizabeth Sauereanna Jones, MD   REASON FOR CONSULTATION: Shortness of breath, possible congestive heart failure.   HISTORY OF PRESENT ILLNESS: Ms. Velora MediateSarah Broden is an obese 74 year old white female who presented to the Emergency Department with increased shortness of breath. Prior to her admission, she had complained of cough and cold that she had "just got over." She did not have any associated fevers but had shortness of breath with minimal exertion, productive cough with yellow phlegm, wheezing and chest congestion. She does not have any exertional chest pain or chest pressure, however, she does have some discomfort and increased pressure sensation after eating or drinking something and denies any history of gastroesophageal reflux disease. She has one-pillow orthopnea, and over the last couple of weeks she has had paroxysmal nocturnal dyspnea. She has also noticed increased pedal edema of her legs and lower extremities over the last week. She denies any associated palpitations.   Of note, the patient was admitted in 2007 with congestive heart failure.   PAST MEDICAL HISTORY:  1. Diabetes mellitus, type 2, diet controlled.  2. History of congestive heart failure in 2007.  3. Emphysema with ongoing tobacco abuse.  4. History of goiter.   PAST SURGICAL HISTORY:  1. Appendectomy. 2. Cholecystectomy. 3. Tonsillectomy.   ALLERGIES: No known drug allergies.   MEDICATIONS: No home medications.  SOCIAL HISTORY: The patient smokes 1/2 pack per day and is trying to quit. She denies any  alcohol or illicit drug use. She drinks about four cups of coffee per day.   FAMILY HISTORY: Both her parents died of heart attack, hypertension in her brothers, diabetes mellitus in  her brother.   REVIEW OF SYSTEMS: The patient complains of fatigue, weakness. Denies any blurry vision. Symptoms positive for coughing, wheezing, shortness of breath. No chest pain, no nausea, vomiting, diarrhea, or abdominal pain.   PHYSICAL EXAMINATION:  GENERAL: This is an obese female who is not in any acute distress. She is on nasal cannula. Currently she is alert and oriented x3.   VITAL SIGNS: Temperature is 98.7 degrees Fahrenheit, heart rate 74, respiratory rate 18, blood pressure  130/74, oxygen saturation is 96% on 4 L/min nasal cannula.   HEENT: Head: Atraumatic, normocephalic. Eyes: Pupils are round and equal, bilaterally reactive to light. No scleral icterus. Conjunctivae are pale pink. Ears and nose: Normal to external inspection. Mouth: Moist mucous membranes.   NECK: Large goiter present. Trachea is midline.   NECK: Supple.   LUNGS: Decreased breath sounds in the bases, scattered wheezing. No audible crackles heard.   CARDIOVASCULAR: Regular rate and rhythm. No murmurs, rubs, or gallops appreciated.   ABDOMEN: Nondistended. Bowel sounds are present in all four quadrants. There is no rebound, tenderness, guarding or peritoneal signs.   EXTREMITIES: 1+ pedal edema bilaterally.    ASSESSMENT/PLAN:  1. Respiratory failure: Likely secondary to obstructive pulmonary disease/emphysema and congestive heart failure. The patient denies any chest pain at this time, and EKG was with no acute abnormalities. She is improving with diuresis. She is also on carvedilol and ACE inhibitor. Echocardiogram has been ordered, and we will make further recommendations based on those results.  2. Chronic obstructive pulmonary disease exacerbation: The patient has had recent cold and is being treated with empiric antibiotics for  possible infectious process. She had been weaned from BiPAP to nasal cannula.  3. Bilateral lower extremity edema with mild erythema: The patient is already on antibiotics.   4. Goiter with subclinical hyperthyroidism: Being followed by hospitalist.  5. Diabetes mellitus, type 2: The patient is on sliding scale insulin.  6. We will continue to follow this patient with you.   Thank you very much for this consultation and allowing [place] to participate in this patient's care.   ____________________________ Verta Ellen, PA-C mam:cbb D: 11/04/2011 12:37:52 ET T: 11/04/2011 13:11:48 ET JOB#: [id number]  cc: Verta Ellen, PA-C, <Dictator> Duanne Limerick, MD Mazi Brailsford A Lillyahna Hemberger PA ELECTRONICALLY SIGNED 11/05/2011 8:05

## 2014-10-14 NOTE — H&P (Signed)
PATIENT NAME:  Sheila Hayes, Sheila Hayes MR#:  [id number] DATE OF BIRTH:  09/09/1940  DATE OF ADMISSION:  11/03/2011  PRIMARY CARE PHYSICIAN: Dr. Elizabeth Sauereanna Jones  CHIEF COMPLAINT: Increasing shortness of breath for 1 to 2 days and bilateral lower extremity swelling for about a week.   HISTORY OF PRESENT ILLNESS: [name] is a pleasant 74 year old Caucasian female with past medical history of diabetes and emphysema, not on medication, comes to the Emergency Room with complaints of increasing shortness of breath for last couple of days along with bilateral lower extremity swelling for about a week. Patient denies any chest pain. She came in via EMS with sats 94% on 5 liters nasal cannula oxygen. She has got bilateral lower extremity edema and work-up in the ER is suggestive of congestive heart failure. Patient had congestive heart failure one time in the remote past. Patient currently is not on any medications. She is being admitted for further evaluation and management on respiratory failure secondary to suspected congestive heart failure with mild chronic obstructive pulmonary disease exacerbation.   PAST MEDICAL HISTORY:  1. Congestive heart failure in 2007.  2. Type 2 diabetes, diet controlled, not on any medication.  3. Emphysema with ongoing tobacco abuse.   PAST SURGICAL HISTORY:  1. Appendectomy. 2. Cholecystectomy.  3. Tonsillectomy.   ALLERGIES: No known drug allergies.   MEDICATIONS: None.   FAMILY HISTORY: Both parents died of heart attack.    REVIEW OF SYSTEMS: CONSTITUTIONAL: Positive for fatigue, weakness. No fever. EYES: No blurred or double vision. No glaucoma. ENT: No tinnitus, ear pain, hearing loss or epistaxis. RESPIRATORY: Positive for productive cough, wheeze and hemoptysis. CARDIOVASCULAR: Positive for increasing shortness of breath along with chronic obstructive pulmonary disease. GASTROINTESTINAL: No nausea, vomiting, diarrhea, abdominal pain or gastroesophageal reflux  disease. GENITOURINARY: No dysuria, hematuria. ENDOCRINE: No polyuria, nocturia, or thyroid problems. HEMATOLOGY: No anemia or easy bruising. SKIN: No acne, rash. MUSCULOSKELETAL: Positive for arthritis. NEUROLOGIC: No cerebrovascular accident, transient ischemic attack. PSYCH: No anxiety or depression. All other systems reviewed and negative.   PHYSICAL EXAMINATION:  GENERAL: Patient is morbidly obese, awake, alert, oriented x3, currently on BiPAP.   VITAL SIGNS: Temperature 98, pulse 99, blood pressure 159/85, sats 94% on current BiPAP setting.   HEENT: Atraumatic, normocephalic. Pupils are equal, round, and reactive to light and accommodation. Extraocular movements intact. Oral mucosa is dry.   NECK: Supple. No JVD. No carotid bruit.   RESPIRATORY: Decreased breath sounds in the bases. Patient does have some scattered wheezing. No use of accessory muscles or respiratory distress. Decreased breath sounds in both the bases. No audible crackles heard.   CARDIOVASCULAR: Both the heart sounds are normal. Rhythm is regular, rate is mild tachycardic. No murmur heard. PMI not lateralized. Chest nontender.   EXTREMITIES: Patient has pitting edema bilaterally in both the lower extremities with some mild erythema in her feet.    SKIN: Warm and dry. Patient does have erythematous patches in both her bilateral feet. It is somewhat mild some warm to touch.   NEUROLOGICAL: Grossly intact cranial nerves II through XII. No motor or sensory deficits.   PSYCH: Patient is awake.   LABORATORY, DIAGNOSTIC AND RADIOLOGICAL DATA: pCO2 74. Please the pCO2 is a venous sample. Likely an error. Chest x-ray consistent with heterogenous left lower opacity may represent infection. There is small left pleural effusion. Underlying parenchymal abnormality is not excluded. Prominence of superior mediastinum may be related to patient's rotation to the left. B-type natriuretic peptide 2663. Cardiac enzymes,  first set,  negative. CBC within normal limits. Glucose 122, BUN 7. Rest of the electrolytes normal. CO2 33, serum calcium 8.2.    Albumin 3.0. Troponin is less than 0.02. TSH 0.209. Free thyroxine 1.24.   ASSESSMENT: 74 year old Ms. Helvey who presents with:  1. Acute hypoxic hypercarbic respiratory failure due to chronic obstructive pulmonary disease flare and congestive heart failure, new onset. Patient received IV Lasix 40 mg and one dose of IV Solu-Medrol along with a dose of IV Levaquin. Will continue diuresis with Lasix 20 mg t.i.d. and change it to p.o. as and when appropriate. Cycle cardiac enzymes x3. Check echo to assess LV function. Will start patient on lisinopril and Coreg. Cardiology consultation with Dr. Adrian Blackwater for new onset congestive heart failure. Importance of low sodium diet was explained to patient. Place patient on 2 grams sodium, ADA 1800 calorie diet.  2. Acute bronchitis. Patient had some cough mild productive with no fever, however, chest x-ray consistent with some heterogenous opacities in the left lower lobe, could be infection versus edema from congestive heart failure. Will continue IV Levaquin.  3. Chronic obstructive pulmonary disease exacerbation. Will start patient on IV Solu-Medrol, SVNs and oral inhalers. Continue empiric IV Levaquin. Follow up blood cultures and sputum cultures.  4. Bilateral lower extremity erythema from patient's shoe irritation due to swollen legs versus cellulitis. Empiric Levaquin is already started, cover any infection/inflammation in the lower extremity.  5. Ongoing tobacco abuse. Counseled cessation for about three minutes.  6. Questionable subclinical hyperthyroidism Free T3 and T4 are pending.  7. Deep vein thrombosis prophylaxis with heparin.  8. Type 2 diabetes. Will start patient on sliding scale insulin. Will also start patient on some p.o. metformin and check hemoglobin A1c.  9. Will continue noninvasive BiPAP for now and then wean to  nasal cannula oxygen as patient tolerates.  10. Physical therapy for ambulation and safety. Patient has been feeling unsteady at home.  11. Care management for discharge planning.  12. Further work-up according to patient's clinical course. Hospital admission plan was discussed with patient and family members who is agreeable to it.     TIME SPENT: 50 minutes.    ____________________________ Wylie Hail Allena Katz, MD sap:cms D: 11/03/2011 18:41:26 ET T: 11/04/2011 05:45:42 ET JOB#: [id number]  cc: Willisha Sligar A. Allena Katz, MD, <Dictator> Duanne Limerick, MD Willow Ora MD ELECTRONICALLY SIGNED 11/07/2011 16:55

## 2015-01-07 ENCOUNTER — Other Ambulatory Visit: Payer: Self-pay | Admitting: Family Medicine

## 2015-01-07 DIAGNOSIS — Z780 Asymptomatic menopausal state: Secondary | ICD-10-CM

## 2015-04-01 ENCOUNTER — Other Ambulatory Visit: Payer: Self-pay | Admitting: Family Medicine

## 2015-04-01 DIAGNOSIS — R1084 Generalized abdominal pain: Secondary | ICD-10-CM

## 2015-04-03 ENCOUNTER — Ambulatory Visit
Admission: RE | Admit: 2015-04-03 | Discharge: 2015-04-03 | Disposition: A | Discharge status: Home / Self Care | Payer: Medicare Other | Source: Ambulatory Visit | Attending: Family Medicine | Admitting: Family Medicine

## 2015-04-03 DIAGNOSIS — R109 Unspecified abdominal pain: Secondary | ICD-10-CM | POA: Insufficient documentation

## 2015-04-03 DIAGNOSIS — N281 Cyst of kidney, acquired: Secondary | ICD-10-CM | POA: Diagnosis not present

## 2015-04-03 DIAGNOSIS — R1084 Generalized abdominal pain: Secondary | ICD-10-CM

## 2015-04-03 DIAGNOSIS — K5730 Diverticulosis of large intestine without perforation or abscess without bleeding: Secondary | ICD-10-CM | POA: Insufficient documentation

## 2015-04-03 HISTORY — DX: Type 2 diabetes mellitus without complications: E11.9

## 2015-04-03 HISTORY — DX: Essential (primary) hypertension: I10

## 2015-04-03 MED ORDER — IOHEXOL 300 MG/ML  SOLN
100.0000 mL | Freq: Once | INTRAMUSCULAR | Status: AC | PRN
  Administered 2015-04-03: 100 mL via INTRAVENOUS

## 2016-01-15 ENCOUNTER — Other Ambulatory Visit: Payer: Self-pay | Admitting: Family Medicine

## 2016-01-15 DIAGNOSIS — Z780 Asymptomatic menopausal state: Secondary | ICD-10-CM

## 2016-03-26 ENCOUNTER — Other Ambulatory Visit: Payer: Self-pay | Admitting: Family Medicine

## 2016-03-26 DIAGNOSIS — N281 Cyst of kidney, acquired: Secondary | ICD-10-CM

## 2016-04-06 ENCOUNTER — Ambulatory Visit
Admission: RE | Admit: 2016-04-06 | Discharge: 2016-04-06 | Disposition: A | Discharge status: Home / Self Care | Payer: Medicare Other | Source: Ambulatory Visit | Attending: Family Medicine | Admitting: Family Medicine

## 2016-04-06 DIAGNOSIS — N281 Cyst of kidney, acquired: Secondary | ICD-10-CM | POA: Diagnosis present

## 2016-05-13 ENCOUNTER — Inpatient Hospital Stay: Payer: Medicare Other

## 2016-05-13 ENCOUNTER — Inpatient Hospital Stay
Admission: EM | Admit: 2016-05-13 | Discharge: 2016-05-20 | DRG: 208 | Disposition: A | Discharge status: Skilled Nursing Facility | Payer: Medicare Other | Attending: Internal Medicine | Admitting: Internal Medicine

## 2016-05-13 ENCOUNTER — Encounter: Payer: Self-pay | Admitting: Emergency Medicine

## 2016-05-13 ENCOUNTER — Inpatient Hospital Stay
Admit: 2016-05-13 | Discharge: 2016-05-13 | Disposition: A | Discharge status: Home / Self Care | Payer: Medicare Other | Attending: Critical Care Medicine | Admitting: Critical Care Medicine

## 2016-05-13 ENCOUNTER — Emergency Department: Payer: Medicare Other

## 2016-05-13 DIAGNOSIS — R609 Edema, unspecified: Secondary | ICD-10-CM

## 2016-05-13 DIAGNOSIS — N179 Acute kidney failure, unspecified: Secondary | ICD-10-CM | POA: Diagnosis present

## 2016-05-13 DIAGNOSIS — E119 Type 2 diabetes mellitus without complications: Secondary | ICD-10-CM | POA: Diagnosis present

## 2016-05-13 DIAGNOSIS — J440 Chronic obstructive pulmonary disease with acute lower respiratory infection: Secondary | ICD-10-CM | POA: Diagnosis present

## 2016-05-13 DIAGNOSIS — Z6836 Body mass index (BMI) 36.0-36.9, adult: Secondary | ICD-10-CM

## 2016-05-13 DIAGNOSIS — J9621 Acute and chronic respiratory failure with hypoxia: Secondary | ICD-10-CM | POA: Diagnosis present

## 2016-05-13 DIAGNOSIS — J189 Pneumonia, unspecified organism: Principal | ICD-10-CM | POA: Diagnosis present

## 2016-05-13 DIAGNOSIS — J9602 Acute respiratory failure with hypercapnia: Secondary | ICD-10-CM

## 2016-05-13 DIAGNOSIS — E039 Hypothyroidism, unspecified: Secondary | ICD-10-CM | POA: Diagnosis present

## 2016-05-13 DIAGNOSIS — I959 Hypotension, unspecified: Secondary | ICD-10-CM | POA: Diagnosis present

## 2016-05-13 DIAGNOSIS — Z9911 Dependence on respirator [ventilator] status: Secondary | ICD-10-CM | POA: Diagnosis not present

## 2016-05-13 DIAGNOSIS — Z4659 Encounter for fitting and adjustment of other gastrointestinal appliance and device: Secondary | ICD-10-CM

## 2016-05-13 DIAGNOSIS — J9622 Acute and chronic respiratory failure with hypercapnia: Secondary | ICD-10-CM | POA: Diagnosis present

## 2016-05-13 DIAGNOSIS — I272 Pulmonary hypertension, unspecified: Secondary | ICD-10-CM | POA: Diagnosis present

## 2016-05-13 DIAGNOSIS — J441 Chronic obstructive pulmonary disease with (acute) exacerbation: Secondary | ICD-10-CM | POA: Diagnosis present

## 2016-05-13 DIAGNOSIS — I5033 Acute on chronic diastolic (congestive) heart failure: Secondary | ICD-10-CM | POA: Diagnosis present

## 2016-05-13 DIAGNOSIS — R0600 Dyspnea, unspecified: Secondary | ICD-10-CM

## 2016-05-13 DIAGNOSIS — E785 Hyperlipidemia, unspecified: Secondary | ICD-10-CM | POA: Diagnosis present

## 2016-05-13 DIAGNOSIS — J810 Acute pulmonary edema: Secondary | ICD-10-CM | POA: Diagnosis not present

## 2016-05-13 DIAGNOSIS — F329 Major depressive disorder, single episode, unspecified: Secondary | ICD-10-CM | POA: Diagnosis present

## 2016-05-13 DIAGNOSIS — E669 Obesity, unspecified: Secondary | ICD-10-CM | POA: Diagnosis present

## 2016-05-13 DIAGNOSIS — I071 Rheumatic tricuspid insufficiency: Secondary | ICD-10-CM | POA: Diagnosis present

## 2016-05-13 DIAGNOSIS — J9601 Acute respiratory failure with hypoxia: Secondary | ICD-10-CM

## 2016-05-13 DIAGNOSIS — J9600 Acute respiratory failure, unspecified whether with hypoxia or hypercapnia: Secondary | ICD-10-CM

## 2016-05-13 DIAGNOSIS — I110 Hypertensive heart disease with heart failure: Secondary | ICD-10-CM | POA: Diagnosis present

## 2016-05-13 DIAGNOSIS — M6281 Muscle weakness (generalized): Secondary | ICD-10-CM

## 2016-05-13 DIAGNOSIS — R262 Difficulty in walking, not elsewhere classified: Secondary | ICD-10-CM

## 2016-05-13 HISTORY — DX: Chronic diastolic (congestive) heart failure: I50.32

## 2016-05-13 HISTORY — DX: Hyperlipidemia, unspecified: E78.5

## 2016-05-13 HISTORY — DX: Hypothyroidism, unspecified: E03.9

## 2016-05-13 HISTORY — DX: Pulmonary hypertension, unspecified: I27.20

## 2016-05-13 HISTORY — DX: Chronic obstructive pulmonary disease, unspecified: J44.9

## 2016-05-13 LAB — BLOOD GAS, ARTERIAL
ALLENS TEST (PASS/FAIL): POSITIVE — AB
Acid-base deficit: 0.2 mmol/L (ref 0.0–2.0)
Acid-base deficit: 2.1 mmol/L — ABNORMAL HIGH (ref 0.0–2.0)
Bicarbonate: 29.5 mmol/L — ABNORMAL HIGH (ref 20.0–28.0)
Bicarbonate: 26.6 mmol/L (ref 20.0–28.0)
FIO2: 0.35
FIO2: 0.35
Mechanical Rate: 12
O2 SAT: 87.7 %
O2 SAT: 86.1 %
PATIENT TEMPERATURE: 37
PCO2 ART: 72 mmHg — AB (ref 32.0–48.0)
PCO2 ART: 62 mmHg — AB (ref 32.0–48.0)
PEEP/CPAP: 5 cmH2O
PEEP: 5 cmH2O
PH ART: 7.22 — AB (ref 7.350–7.450)
PO2 ART: 61 mmHg — AB (ref 83.0–108.0)
Patient temperature: 37
RATE: 16 resp/min
RATE: 12 resp/min
VT: 550 mL
VT: 550 mL
pH, Arterial: 7.24 — ABNORMAL LOW (ref 7.350–7.450)
pO2, Arterial: 65 mmHg — ABNORMAL LOW (ref 83.0–108.0)

## 2016-05-13 LAB — COMPREHENSIVE METABOLIC PANEL
ALBUMIN: 3.8 g/dL (ref 3.5–5.0)
ALK PHOS: 83 U/L (ref 38–126)
ALT: 34 U/L (ref 14–54)
AST: 33 U/L (ref 15–41)
Anion gap: 8 (ref 5–15)
BUN: 43 mg/dL — ABNORMAL HIGH (ref 6–20)
CALCIUM: 8.8 mg/dL — AB (ref 8.9–10.3)
CO2: 31 mmol/L (ref 22–32)
CREATININE: 1.4 mg/dL — AB (ref 0.44–1.00)
Chloride: 98 mmol/L — ABNORMAL LOW (ref 101–111)
GFR calc Af Amer: 42 mL/min — ABNORMAL LOW (ref >60)
GFR calc non Af Amer: 36 mL/min — ABNORMAL LOW (ref >60)
GLUCOSE: 215 mg/dL — AB (ref 65–99)
Potassium: 5 mmol/L (ref 3.5–5.1)
SODIUM: 137 mmol/L (ref 135–145)
Total Bilirubin: 0.9 mg/dL (ref 0.3–1.2)
Total Protein: 7 g/dL (ref 6.5–8.1)

## 2016-05-13 LAB — MRSA PCR SCREENING: MRSA BY PCR: POSITIVE — AB

## 2016-05-13 LAB — CBC WITH DIFFERENTIAL/PLATELET
Basophils Absolute: 0.1 10*3/uL (ref 0–0.1)
Basophils Relative: 1 %
EOS ABS: 0 10*3/uL (ref 0–0.7)
Eosinophils Relative: 0 %
HEMATOCRIT: 47.3 % — AB (ref 35.0–47.0)
HEMOGLOBIN: 14.8 g/dL (ref 12.0–16.0)
LYMPHS ABS: 3.7 10*3/uL — AB (ref 1.0–3.6)
LYMPHS PCT: 29 %
MCH: 28.3 pg (ref 26.0–34.0)
MCHC: 31.3 g/dL — AB (ref 32.0–36.0)
MCV: 90.4 fL (ref 80.0–100.0)
MONOS PCT: 7 %
Monocytes Absolute: 0.9 10*3/uL (ref 0.2–0.9)
NEUTROS PCT: 63 %
Neutro Abs: 8.3 10*3/uL — ABNORMAL HIGH (ref 1.4–6.5)
Platelets: 324 10*3/uL (ref 150–440)
RBC: 5.23 MIL/uL — AB (ref 3.80–5.20)
RDW: 14.7 % — ABNORMAL HIGH (ref 11.5–14.5)
WBC: 13 10*3/uL — AB (ref 3.6–11.0)

## 2016-05-13 LAB — TROPONIN I
Troponin I: <0.03 ng/mL (ref <0.03)
Troponin I: <0.03 ng/mL (ref <0.03)

## 2016-05-13 LAB — ECHOCARDIOGRAM COMPLETE
Height: 67 in
WEIGHTICAEL: 4800 [oz_av]

## 2016-05-13 LAB — BRAIN NATRIURETIC PEPTIDE: B Natriuretic Peptide: 713 pg/mL — ABNORMAL HIGH (ref 0.0–100.0)

## 2016-05-13 LAB — GLUCOSE, CAPILLARY
GLUCOSE-CAPILLARY: 204 mg/dL — AB (ref 65–99)
Glucose-Capillary: 166 mg/dL — ABNORMAL HIGH (ref 65–99)
Glucose-Capillary: 260 mg/dL — ABNORMAL HIGH (ref 65–99)

## 2016-05-13 LAB — PROCALCITONIN: Procalcitonin: 0.12 ng/mL

## 2016-05-13 MED ORDER — FENTANYL CITRATE (PF) 100 MCG/2ML IJ SOLN
50.0000 ug | Freq: Once | INTRAMUSCULAR | Status: AC
  Administered 2016-05-13: 50 ug via INTRAVENOUS
  Filled 2016-05-13: qty 2

## 2016-05-13 MED ORDER — PROPOFOL 1000 MG/100ML IV EMUL
5.0000 ug/kg/min | Freq: Once | INTRAVENOUS | Status: AC
  Administered 2016-05-13: 50 ug/kg/min via INTRAVENOUS

## 2016-05-13 MED ORDER — IPRATROPIUM-ALBUTEROL 0.5-2.5 (3) MG/3ML IN SOLN
3.0000 mL | Freq: Once | RESPIRATORY_TRACT | Status: AC
  Administered 2016-05-13: 3 mL via RESPIRATORY_TRACT

## 2016-05-13 MED ORDER — HEPARIN SODIUM (PORCINE) 5000 UNIT/ML IJ SOLN
5000.0000 [IU] | Freq: Three times a day (TID) | INTRAMUSCULAR | Status: DC
  Administered 2016-05-13: 5000 [IU] via SUBCUTANEOUS
  Filled 2016-05-13: qty 1

## 2016-05-13 MED ORDER — SODIUM CHLORIDE 0.9 % IV SOLN: 250.0000 mL | INTRAVENOUS | Status: DC | PRN

## 2016-05-13 MED ORDER — FUROSEMIDE 10 MG/ML IJ SOLN
40.0000 mg | Freq: Once | INTRAMUSCULAR | Status: DC
  Filled 2016-05-13: qty 4

## 2016-05-13 MED ORDER — METHYLPREDNISOLONE SODIUM SUCC 40 MG IJ SOLR
40.0000 mg | Freq: Two times a day (BID) | INTRAMUSCULAR | Status: DC
  Administered 2016-05-13: 40 mg via INTRAVENOUS
  Filled 2016-05-13: qty 1

## 2016-05-13 MED ORDER — SODIUM CHLORIDE 0.9 % IV BOLUS (SEPSIS)
250.0000 mL | Freq: Once | INTRAVENOUS | Status: AC
  Administered 2016-05-13: 250 mL via INTRAVENOUS

## 2016-05-13 MED ORDER — MIDAZOLAM HCL 5 MG/5ML IJ SOLN: 5.0000 mg | Freq: Once | INTRAMUSCULAR | Status: AC

## 2016-05-13 MED ORDER — ASPIRIN 300 MG RE SUPP
300.0000 mg | RECTAL | Status: AC
  Administered 2016-05-13: 300 mg via RECTAL
  Filled 2016-05-13: qty 1

## 2016-05-13 MED ORDER — MIDAZOLAM HCL 5 MG/5ML IJ SOLN
5.0000 mg | Freq: Once | INTRAMUSCULAR | Status: AC
  Administered 2016-05-13: 5 mg via INTRAVENOUS

## 2016-05-13 MED ORDER — IPRATROPIUM-ALBUTEROL 0.5-2.5 (3) MG/3ML IN SOLN
3.0000 mL | RESPIRATORY_TRACT | Status: DC
  Administered 2016-05-13 – 2016-05-20 (×42): 3 mL via RESPIRATORY_TRACT
  Filled 2016-05-13 (×45): qty 3

## 2016-05-13 MED ORDER — MIDAZOLAM HCL 2 MG/2ML IJ SOLN: 1.0000 mg | INTRAMUSCULAR | Status: DC | PRN

## 2016-05-13 MED ORDER — FAMOTIDINE IN NACL 20-0.9 MG/50ML-% IV SOLN
20.0000 mg | Freq: Two times a day (BID) | INTRAVENOUS | Status: DC
  Administered 2016-05-13 – 2016-05-16 (×8): 20 mg via INTRAVENOUS
  Filled 2016-05-13 (×8): qty 50

## 2016-05-13 MED ORDER — FUROSEMIDE 10 MG/ML IJ SOLN
40.0000 mg | Freq: Once | INTRAMUSCULAR | Status: AC
  Administered 2016-05-13: 40 mg via INTRAVENOUS
  Filled 2016-05-13: qty 4

## 2016-05-13 MED ORDER — FENTANYL CITRATE (PF) 100 MCG/2ML IJ SOLN
50.0000 ug | Freq: Once | INTRAMUSCULAR | Status: AC
  Administered 2016-05-13: 50 ug via INTRAVENOUS

## 2016-05-13 MED ORDER — IPRATROPIUM-ALBUTEROL 0.5-2.5 (3) MG/3ML IN SOLN
3.0000 mL | RESPIRATORY_TRACT | Status: DC | PRN
  Administered 2016-05-17: 3 mL via RESPIRATORY_TRACT

## 2016-05-13 MED ORDER — DEXTROSE 5 % IV SOLN: 500.0000 mg | INTRAVENOUS | Status: DC

## 2016-05-13 MED ORDER — FENTANYL 2500MCG IN NS 250ML (10MCG/ML) PREMIX INFUSION
25.0000 ug/h | INTRAVENOUS | Status: DC
  Administered 2016-05-13: 250 ug/h via INTRAVENOUS
  Administered 2016-05-14 – 2016-05-16 (×3): 100 ug/h via INTRAVENOUS
  Filled 2016-05-13 (×4): qty 250

## 2016-05-13 MED ORDER — DEXTROSE 5 % IV SOLN
500.0000 mg | INTRAVENOUS | Status: DC
  Administered 2016-05-14 – 2016-05-15 (×2): 500 mg via INTRAVENOUS
  Filled 2016-05-13 (×3): qty 500

## 2016-05-13 MED ORDER — DEXTROSE 5 % IV SOLN
500.0000 mg | Freq: Once | INTRAVENOUS | Status: AC
  Administered 2016-05-13: 500 mg via INTRAVENOUS
  Filled 2016-05-13: qty 500

## 2016-05-13 MED ORDER — DEXTROSE 5 % IV SOLN: 1.0000 g | Freq: Once | INTRAVENOUS | Status: DC

## 2016-05-13 MED ORDER — CEFTRIAXONE SODIUM-DEXTROSE 1-3.74 GM-% IV SOLR
1.0000 g | Freq: Once | INTRAVENOUS | Status: AC
  Administered 2016-05-13: 1 g via INTRAVENOUS
  Filled 2016-05-13: qty 50

## 2016-05-13 MED ORDER — ASPIRIN 81 MG PO CHEW: 324.0000 mg | CHEWABLE_TABLET | ORAL | Status: AC

## 2016-05-13 MED ORDER — CHLORHEXIDINE GLUCONATE CLOTH 2 % EX PADS
6.0000 | MEDICATED_PAD | Freq: Every day | CUTANEOUS | Status: AC
  Administered 2016-05-14 – 2016-05-18 (×5): 6 via TOPICAL

## 2016-05-13 MED ORDER — MUPIROCIN 2 % EX OINT
1.0000 "application " | TOPICAL_OINTMENT | Freq: Two times a day (BID) | CUTANEOUS | Status: AC
  Administered 2016-05-13 – 2016-05-18 (×10): 1 via NASAL
  Filled 2016-05-13: qty 22

## 2016-05-13 MED ORDER — CITALOPRAM HYDROBROMIDE 10 MG/5ML PO SOLN: 20.0000 mg | Freq: Every day | ORAL | Status: DC

## 2016-05-13 MED ORDER — CHLORHEXIDINE GLUCONATE 0.12% ORAL RINSE (MEDLINE KIT)
15.0000 mL | Freq: Two times a day (BID) | OROMUCOSAL | Status: DC
  Administered 2016-05-13 – 2016-05-17 (×8): 15 mL via OROMUCOSAL

## 2016-05-13 MED ORDER — ORAL CARE MOUTH RINSE
15.0000 mL | Freq: Four times a day (QID) | OROMUCOSAL | Status: DC
  Administered 2016-05-13 – 2016-05-17 (×15): 15 mL via OROMUCOSAL

## 2016-05-13 MED ORDER — METHYLPREDNISOLONE SODIUM SUCC 125 MG IJ SOLR
125.0000 mg | Freq: Once | INTRAMUSCULAR | Status: AC
  Administered 2016-05-13: 125 mg via INTRAVENOUS
  Filled 2016-05-13: qty 2

## 2016-05-13 MED ORDER — FENTANYL BOLUS VIA INFUSION
25.0000 ug | INTRAVENOUS | Status: DC | PRN
  Filled 2016-05-13: qty 25

## 2016-05-13 MED ORDER — CITALOPRAM HYDROBROMIDE 20 MG PO TABS
20.0000 mg | ORAL_TABLET | Freq: Every day | ORAL | Status: DC
  Administered 2016-05-13 – 2016-05-20 (×8): 20 mg via ORAL
  Filled 2016-05-13 (×8): qty 1

## 2016-05-13 MED ORDER — ENOXAPARIN SODIUM 40 MG/0.4ML ~~LOC~~ SOLN
40.0000 mg | Freq: Two times a day (BID) | SUBCUTANEOUS | Status: DC
  Administered 2016-05-13 – 2016-05-15 (×4): 40 mg via SUBCUTANEOUS
  Filled 2016-05-13 (×4): qty 0.4

## 2016-05-13 MED ORDER — FUROSEMIDE 10 MG/ML IJ SOLN
20.0000 mg | Freq: Two times a day (BID) | INTRAMUSCULAR | Status: DC
  Administered 2016-05-14 – 2016-05-19 (×12): 20 mg via INTRAVENOUS
  Filled 2016-05-13 (×12): qty 2

## 2016-05-13 MED ORDER — INSULIN ASPART 100 UNIT/ML ~~LOC~~ SOLN
0.0000 [IU] | SUBCUTANEOUS | Status: DC
  Administered 2016-05-13: 7 [IU] via SUBCUTANEOUS
  Administered 2016-05-13: 11 [IU] via SUBCUTANEOUS
  Administered 2016-05-14 (×2): 4 [IU] via SUBCUTANEOUS
  Administered 2016-05-14: 3 [IU] via SUBCUTANEOUS
  Administered 2016-05-14 (×2): 7 [IU] via SUBCUTANEOUS
  Administered 2016-05-14 – 2016-05-15 (×2): 4 [IU] via SUBCUTANEOUS
  Administered 2016-05-15 (×2): 7 [IU] via SUBCUTANEOUS
  Administered 2016-05-15: 11 [IU] via SUBCUTANEOUS
  Administered 2016-05-15: 4 [IU] via SUBCUTANEOUS
  Administered 2016-05-16: 7 [IU] via SUBCUTANEOUS
  Administered 2016-05-16 (×4): 11 [IU] via SUBCUTANEOUS
  Administered 2016-05-16: 7 [IU] via SUBCUTANEOUS
  Administered 2016-05-17 (×2): 11 [IU] via SUBCUTANEOUS
  Administered 2016-05-17: 4 [IU] via SUBCUTANEOUS
  Administered 2016-05-17: 11 [IU] via SUBCUTANEOUS
  Administered 2016-05-17: 4 [IU] via SUBCUTANEOUS
  Administered 2016-05-17: 11 [IU] via SUBCUTANEOUS
  Administered 2016-05-18: 4 [IU] via SUBCUTANEOUS
  Administered 2016-05-18: 3 [IU] via SUBCUTANEOUS
  Administered 2016-05-18: 4 [IU] via SUBCUTANEOUS
  Administered 2016-05-18: 3 [IU] via SUBCUTANEOUS
  Administered 2016-05-18 (×2): 4 [IU] via SUBCUTANEOUS
  Administered 2016-05-19 (×3): 3 [IU] via SUBCUTANEOUS
  Administered 2016-05-19: 7 [IU] via SUBCUTANEOUS
  Administered 2016-05-19 – 2016-05-20 (×2): 3 [IU] via SUBCUTANEOUS
  Filled 2016-05-13 (×2): qty 4
  Filled 2016-05-13: qty 7
  Filled 2016-05-13: qty 4
  Filled 2016-05-13: qty 7
  Filled 2016-05-13: qty 11
  Filled 2016-05-13: qty 7
  Filled 2016-05-13: qty 3
  Filled 2016-05-13: qty 4
  Filled 2016-05-13: qty 11
  Filled 2016-05-13 (×2): qty 4
  Filled 2016-05-13: qty 11
  Filled 2016-05-13: qty 7
  Filled 2016-05-13: qty 3
  Filled 2016-05-13: qty 11
  Filled 2016-05-13: qty 3
  Filled 2016-05-13: qty 11
  Filled 2016-05-13: qty 4
  Filled 2016-05-13: qty 11
  Filled 2016-05-13: qty 7
  Filled 2016-05-13: qty 3
  Filled 2016-05-13: qty 4
  Filled 2016-05-13: qty 11
  Filled 2016-05-13 (×2): qty 3
  Filled 2016-05-13: qty 7
  Filled 2016-05-13: qty 11
  Filled 2016-05-13: qty 4
  Filled 2016-05-13: qty 11
  Filled 2016-05-13: qty 3
  Filled 2016-05-13: qty 4
  Filled 2016-05-13: qty 7
  Filled 2016-05-13: qty 11
  Filled 2016-05-13: qty 3
  Filled 2016-05-13: qty 4

## 2016-05-13 NOTE — Progress Notes (Signed)
Initial Nutrition Assessment  DOCUMENTATION CODES:   Morbid obesity  INTERVENTION:  -If unable to extubate within 24-48 hours, recommend initiation of Adult Tube Feeding Protocol   NUTRITION DIAGNOSIS:   Inadequate oral intake related to acute illness as evidenced by NPO status.  GOAL:   Patient will meet greater than or equal to 90% of their needs  MONITOR:   Labs, Weight trends, Vent status, TF tolerance  REASON FOR ASSESSMENT:   Ventilator    ASSESSMENT:     75 yo female admitted with acute on chronic respiratory failure secondary to AECOPD vs pulmonary edema   Pt currently sedated on vent Labs: reviewed Meds: solumedrol, ss novolog  Diet Order:  Diet NPO time specified  Skin:  Reviewed, no issues  Last BM:  no documented BM  Height:   Ht Readings from Last 1 Encounters:  05/13/16 5\' 7"  (1.702 m)    Weight:   Wt Readings from Last 1 Encounters:  05/13/16 300 lb (136.1 kg)   BMI:  Body mass index is 46.99 kg/m.  Estimated Nutritional Needs:   Kcal:  [id number] kcals  Protein:  >/= 122 g  Fluid:  >/= 1.5 L  EDUCATION NEEDS:   No education needs identified at this time  Romelle StarcherCate Nayzeth Altman MS, RD, LDN [phone] Pager  [phone] Weekend/On-Call Pager

## 2016-05-13 NOTE — ED Notes (Signed)
Pt tolerating tube comfortably at this time.  Continue to monitor.

## 2016-05-13 NOTE — ED Notes (Signed)
Report to BuckmanFelicia, RN for ccu 7

## 2016-05-13 NOTE — ED Provider Notes (Signed)
[facility] Emergency Department Provider Note  ____________________________________________  Time seen: Approximately 9:27 AM  I have reviewed the triage vital signs and the nursing notes.  Level 5 caveat:  Portions of the history and physical were unable to be obtained due to obtunded   HISTORY  Chief Complaint Shortness of Breath   HPI ANDREIKA VANDAGRIFF is a 75 y.o. female with a history of COPD, CHF (EF 50% on ECHO in 2015), hypertension and diabetes who presents for evaluation of shortness of breath. Patient arrives obtunded and unable to provide any history. Per EMS she was alert and oriented when they arrived at the PCPs office with end tidal in the 60s and lower extremity edema. Patient's mental status deteriorated in route to the ED. Patient unable to provide history.   PCP note from today says: "Patient complains of dyspnea. Symptoms began 3 weeks ago. She does have swelling of the lower extremities. Has had shortness of breath along with wheezing. She has not been using her albuterol inhaler. She has oxygen at home, but has not been using it this week. It is unclear if she is using symbicort, but has been using spiriva. She has had a non productive cough along with wheezing. She has not had weight gain, but has had lower extremity edema. She does not weight herself daily. Symptoms have been unchanged since that time.The cough is nonproductive and is aggravated by walking or minimal activity. Associated symptoms include: shortness of breath and wheezing. Patient does not have new pets. Patient does not have a history of asthma. . Patient has not traveled recently. Patient does have a history of smoking in the past. Patient has not had a previous chest x-ray. "    Past Medical History:  Diagnosis Date  . Diabetes mellitus without complication (HCC)   . Hypertension     There are no active problems to display for this patient.   History reviewed. No  pertinent surgical history.  Prior to Admission medications   Not on File    Allergies Patient has no known allergies.  No family history on file.  Social History Social History  Substance Use Topics  . Smoking status: Not on file  . Smokeless tobacco: Not on file  . Alcohol use Not on file    Review of Systems  Constitutional: Negative for fever. Eyes: Negative for visual changes. ENT: Negative for sore throat. Neck: No neck pain  Cardiovascular: Negative for chest pain. Respiratory: + shortness of breath, wheezing and cough Gastrointestinal: Negative for abdominal pain, vomiting or diarrhea. Genitourinary: Negative for dysuria. Musculoskeletal: Negative for back pain. + bl leg swelling Skin: Negative for rash. Neurological: Negative for headaches, weakness or numbness. Psych: No SI or HI  ____________________________________________   PHYSICAL EXAM:  VITAL SIGNS: ED Triage Vitals  Enc Vitals Group     BP 05/13/16 0908 (!) 167/92     Pulse Rate 05/13/16 0908 96     Resp --      Temp 05/13/16 0908 97.8 F (36.6 C)     Temp Source 05/13/16 0908 Oral     SpO2 05/13/16 0908 96 %     Weight 05/13/16 0909 300 lb (136.1 kg)     Height 05/13/16 0909 5\' 7"  (1.702 m)     Head Circumference --      Peak Flow --      Pain Score --      Pain Loc --      Pain  Edu? --      Excl. in GC? --     Constitutional: Obtunded, not answering questions HEENT:      Head: Normocephalic and atraumatic.         Eyes: Conjunctivae are normal. Sclera is non-icteric. EOMI. PERRL      Mouth/Throat: Mucous membranes are moist.       Neck: Supple with no signs of meningismus. Cardiovascular: Regular rate and rhythm. No murmurs, gallops, or rubs. 2+ symmetrical distal pulses are present in all extremities. No JVD. Respiratory: Hypoxic, tachypneic, severely diminished breath sounds b/l.  Gastrointestinal: Soft, non tender, Musculoskeletal: 1+ pitting edema b/l LE Neurologic: Opens  eyes spontaneously, mumbling, does not follow commands, localizes to pain Skin: Skin is warm, dry and intact. No rash noted.   ____________________________________________   LABS (all labs ordered are listed, but only abnormal results are displayed)  Labs Reviewed  CBC WITH DIFFERENTIAL/PLATELET - Abnormal; Notable for the following:       Result Value   WBC 13.0 (*)    RBC 5.23 (*)    HCT 47.3 (*)    MCHC 31.3 (*)    RDW 14.7 (*)    Neutro Abs 8.3 (*)    Lymphs Abs 3.7 (*)    All other components within normal limits  COMPREHENSIVE METABOLIC PANEL - Abnormal; Notable for the following:    Chloride 98 (*)    Glucose, Bld 215 (*)    BUN 43 (*)    Creatinine, Ser 1.40 (*)    Calcium 8.8 (*)    GFR calc non Af Amer 36 (*)    GFR calc Af Amer 42 (*)    All other components within normal limits  BLOOD GAS, ARTERIAL - Abnormal; Notable for the following:    pH, Arterial 7.22 (*)    pCO2 arterial 72 (*)    pO2, Arterial 65 (*)    Bicarbonate 29.5 (*)    All other components within normal limits  TROPONIN I  BRAIN NATRIURETIC PEPTIDE   ____________________________________________  EKG   ED ECG REPORT I, Nita Sicklearolina Jazmarie Biever, the attending physician, personally viewed and interpreted this ECG.  Normal sinus rhythm, rate of 77, right bundle branch block, normal QTc interval, right axis deviation, no ST elevations or depressions. RBBB is new when compared to EKG from 2015 ____________________________________________  RADIOLOGY  CXR:  Right mainstem intubation with left lower lobe atelectasis. Recommend retracting endotracheal tube 3-4 cm.  ____________________________________________   PROCEDURES  Procedure(s) performed:yes Procedure Name: Intubation Date/Time: 05/13/2016 10:00 AM Performed by: Nita SickleVERONESE, [place] Oxygen Delivery Method: Ambu bag Preoxygenation: Pre-oxygenation with 100% oxygen Intubation Type: Rapid sequence Ventilation: Mask ventilation without  difficulty Laryngoscope Size: Glidescope and 4 Tube type: ETT. Tube size: 7.5 mm Number of attempts: 1 Airway Equipment and Method: Video-laryngoscopy Placement Confirmation: ETT inserted through vocal cords under direct vision,  Positive ETCO2 and Breath sounds checked- equal and bilateral Secured at: 23 cm Tube secured with: ETT holder Difficulty Due To: Difficult Airway- due to large tongue       CRITICAL CARE: Yes  CRITICAL CARE Performed by: Nita Sicklearolina Jamas [place]  ?  Total critical care time: 40 min  Critical care time was exclusive of separately billable procedures and treating other patients.  Critical care was necessary to treat or prevent imminent or life-threatening deterioration.  Critical care was time spent personally by me on the following activities: development of treatment plan with patient and/or surrogate as well as nursing, discussions with consultants, evaluation of patient's  response to treatment, examination of patient, obtaining history from patient or surrogate, ordering and performing treatments and interventions, ordering and review of laboratory studies, ordering and review of radiographic studies, pulse oximetry and re-evaluation of patient's condition.  ____________________________________________   INITIAL IMPRESSION / ASSESSMENT AND PLAN / ED COURSE  75 y.o. female with a history of COPD, CHF (EF 50% on ECHO in 2015), hypertension and diabetes who presents for evaluation of shortness of breath x 3 weeks associated with wheezing, cough, b/l LE edema, and medication non compliance. Patient arrives obtunded to the emergency department and was intubated with succinylcholine and etomidate for airway protection and hypoxic/hypercarbic respiratory failure. Please see procedure note above. Patient was started on continuous albuterol and ipratropium, she was given IV Solu-Medrol, IV ceftriaxone and azithromycin for CA-PNA. Patient was started on propofol for  sedation. BP became soft in the 90s on propofol so lasix was withheld for now. EKG with new RBBB when compared to 2015, but no ischemic changes. Patient will be admitted to ICU.  Clinical Course as of May 13 1004  Wed May 13, 2016  [id number] CXR showing R main intubation. ETT tube was pulled back 3 cm from 23 to 20 at the lip with + b/l breath sounds.   [CV]    Clinical Course User Index [CV] Nita Sicklearolina Mauriah Mcmillen, MD    Pertinent labs & imaging results that were available during my care of the patient were reviewed by me and considered in my medical decision making (see chart for details).    ____________________________________________   FINAL CLINICAL IMPRESSION(S) / ED DIAGNOSES  Final diagnoses:  Acute respiratory failure with hypoxia and hypercapnia (HCC)  COPD exacerbation (HCC)      NEW MEDICATIONS STARTED DURING THIS VISIT:  New Prescriptions   No medications on file     Note:  This document was prepared using Dragon voice recognition software and may include unintentional dictation errors.    Nita Sicklearolina Aprel Egelhoff, MD 05/13/16 [id number]

## 2016-05-13 NOTE — Progress Notes (Signed)
ET tube was withdrawn from 22cm to 20 cm at the lip, per Dr Don PerkingVeronese request. Bil Breath Sounds ausculted with  saturation and tidal volumes remaining stable, will continue to monitor.

## 2016-05-13 NOTE — ED Notes (Signed)
Dr. Don PerkingVeronese at bedside, RT

## 2016-05-13 NOTE — Progress Notes (Signed)
Pt blood pressure dropped after receiving Lasix, NP aware, was expected. Changed propofol drip to fentanyl in order to give diuretic. Family member came in to assist with admission information. Pt has not been taking her medications except for the thyroid pill. Pt does not wear home O2 or take home inhalers. Has a walker and cane but does not use them and fell recently. Has been sick at home and missed MD appointment last Friday but came in to be seen this AM and ended up in ED intubated. Foley patent and draining after lasix received.

## 2016-05-13 NOTE — Progress Notes (Signed)
Spoke with NP after giving patient NS bolus for low blood pressure. Slight improvement with blood pressure. MAP is now 58. Per NP notify if MAP drops below 55. Will continue to monitor and assess. Also per NP hold 0200 lasix order if the systolic blood pressure is below 90.

## 2016-05-13 NOTE — ED Notes (Signed)
ICU MD at bedside

## 2016-05-13 NOTE — Progress Notes (Signed)
Saturations are 100% on 50% FIO2. Decreased to 35% FIO2 and will continue to monitor.

## 2016-05-13 NOTE — H&P (Signed)
PULMONARY / CRITICAL CARE MEDICINE   Name: Sheila ModenaSarah F Chalupa MRN: [id number] DOB: 09-01-1940    ADMISSION DATE:  05/13/2016 CONSULTATION DATE:  05/13/2016  REFERRING MD:  Dr. Don PerkingVeronese  CHIEF COMPLAINT:  Shortness of Breath  HISTORY OF PRESENT ILLNESS:   This is a 75 yo female with PMH of Hypertension, Type II Diabetes Mellitus, COPD, Chronic diastolic CHF (EF 47%50% in 2015), Hypothyroidism, Depression, Hyperlipidemia, Moderate tricuspid valve insufficiency, Tachycardia, and Pulmonary hypertension.  [facility] ER [facility]/22 obtunded and unable to provide history per ER notes.  The pt went to her PCP today 11/22 with complaints of dyspnea, nonproductive cough, and wheezing onset 3 weeks ago she also had bilateral lower extremity edema although she denied weight gain. At PCP she stated she had not been using her albuterol inhaler or home O2 (x1 week).  Her PCP called EMS due to pts respiratory status, upon EMS arrival she was alert and oriented with an end tidal in the 60's, however her mental status continued to deteriorate en route to the ED and she became obtunded requiring intubation in the ER.  PCCM contacted to admit pt due to acute on chronic hypercapnic hypoxic respiratory failure secondary to AECOPD vs Pulmonary Edema requiring mechanical ventilation.    PAST MEDICAL HISTORY :  She  has a past medical history of Diabetes mellitus without complication (HCC) and Hypertension.  PAST SURGICAL HISTORY: She  has no past surgical history on file.  No Known Allergies  No current facility-administered medications on file prior to encounter.    No current outpatient prescriptions on file prior to encounter.    FAMILY HISTORY:  Her has no family status information on file.    SOCIAL HISTORY: She    REVIEW OF SYSTEMS:   Unable to assess pt currently intubated  SUBJECTIVE:  Unable to assess pt currently intubated  VITAL SIGNS: BP (!) 87/58   Pulse 84   Temp 97.8 F (36.6  C) (Oral)   Resp 20   Ht 5\' 7"  (1.702 m)   Wt 300 lb (136.1 kg)   SpO2 100%   BMI 46.99 kg/m   HEMODYNAMICS:    VENTILATOR SETTINGS: Vent Mode: AC FiO2 (%):  [50 %] 50 % Set Rate:  [12 bmp] 12 bmp Vt Set:  [550 mL] 550 mL PEEP:  [5 cmH20] 5 cmH20  INTAKE / OUTPUT: No intake/output data recorded.  PHYSICAL EXAMINATION: General:  Acutely ill appearing obese female Neuro:  Sedated unable to follow commands, PERRL HEENT:  Supple, no JVD Cardiovascular:  s1s2, rrr, no M/R/G, 3+ pitting bilateral lower extremity edema Lungs:  Inspiratory wheezes and diminished throughout, even, non labored intubated Abdomen:  Obese, hypoactive BS x4, soft Musculoskeletal:  Normal tone  Skin: scattered ecchymosis no rashes or lesions   LABS:  BMET  Recent Labs Lab 05/13/16 0916  NA 137  K 5.0  CL 98*  CO2 31  BUN 43*  CREATININE 1.40*  GLUCOSE 215*    Electrolytes  Recent Labs Lab 05/13/16 0916  CALCIUM 8.8*    CBC  Recent Labs Lab 05/13/16 0916  WBC 13.0*  HGB 14.8  HCT 47.3*  PLT 324    Coag's No results for input(s): APTT, INR in the last 168 hours.  Sepsis Markers No results for input(s): LATICACIDVEN, PROCALCITON, O2SATVEN in the last 168 hours.  ABG  Recent Labs Lab 05/13/16 0923  PHART 7.22*  PCO2ART 72*  PO2ART 65*    Liver Enzymes  Recent Labs  Lab 05/13/16 0916  AST 33  ALT 34  ALKPHOS 83  BILITOT 0.9  ALBUMIN 3.8    Cardiac Enzymes  Recent Labs Lab 05/13/16 0916  TROPONINI <0.03    Glucose No results for input(s): GLUCAP in the last 168 hours.  Imaging Dg Chest Portable 1 View  Result Date: 05/13/2016 CLINICAL DATA:  Intubation EXAM: PORTABLE CHEST 1 VIEW COMPARISON:  01/23/2014 FINDINGS: Endotracheal tube is within the right mainstem bronchus and should be retracted 3-4 cm. NG tube enters the stomach. Left lower lobe atelectasis. Mild cardiomegaly and vascular congestion. No visible effusions. IMPRESSION: Right mainstem  intubation with left lower lobe atelectasis. Recommend retracting endotracheal tube 3-4 cm. These results were called by telephone at the time of interpretation on 05/13/2016 at 9:44 am to Dr. Nita SickleAROLINA VERONESE , who verbally acknowledged these results. Electronically Signed   By: Charlett NoseKevin  Dover M.D.   On: 05/13/2016 09:46    STUDIES:  None  CULTURES: Blood 11/22>> Sputum 11/22>>  ANTIBIOTICS: Azithromycin 11/22>> Ceftriaxone 11/22>>x1 dose  SIGNIFICANT EVENTS: 11/22-Pt admitted to ICU due to acute on chronic hypercapnic hypoxic respiratory failure secondary to AECOPD vs. Pulmonary Edema requiring mechanical ventilation  LINES/TUBES: None  ASSESSMENT / PLAN:  PULMONARY A: Acute on chronic hypercapnic hypoxic respiratory failure secondary to AECOPD vs. Pulmonary Edema Hx: Pulmonary Hypertension and Chronic Diastolic CHF P:   Full vent support wean as tolerated VAP bundle IV steroids IV lasix as blood pressure tolerates Continue bronchodilators Hold outpatient symbicort and spiriva Repeat ABG today 11/22 CXR in am   CARDIOVASCULAR A: Hypotension Hx: Chronic diastolic CHF (EF 82%50% in 2015), Hyperlipidemia, and Hypertension P:  Maintain map >65 Levophed as needed to maintain map goal Hold outpatient metoprolol Continue aspirin Continuous telemetry monitoring Trend troponin's Echo pending  RENAL A:   Acute renal failure likely secondary to hypotension P:   Trend BMP's Replace electrolytes as indicated Monitor uop  GASTROINTESTINAL A:   No acute issues P:   Keep NPO for now Pepcid for PUD prophylaxis  HEMATOLOGIC A:   No acute issues  P:  Subq heparin for VTE prophylaxis Monitor for s/sx of bleeding Trend CBC's Transfuse for hgb <7  INFECTIOUS A:   Mild leukocytosis P:   Trend WBC's and monitor fever curve Trend PCT's Continue abx as listed above Follow cultures  ENDOCRINE A:   Type II DM Hx: Hypothyroidism  P:   SSI  Hyper/hypoglycemic  protocol  NEUROLOGIC A:   Hx: Depression P:   RASS goal: 0 to -1 Fentanyl gtt, prn fentanyl and versed to maintain RASS goal Prn Fentanyl for pain management Continue outpatient Citalopram  WUA daily Lights on during the day     FAMILY  - Updates: No family at bedside to update 05/13/2016  - Inter-disciplinary family meet or Palliative Care meeting due by: 05/20/2016

## 2016-05-13 NOTE — ED Notes (Signed)
amidate 30mg  given, succinylcholine 150mg  given.  7.5 tube, +color change, 22 at the lip

## 2016-05-13 NOTE — ED Notes (Signed)
Propofol decreased to 3940mcg/kg due to bp.  Versed given to help relax pt.

## 2016-05-13 NOTE — ED Notes (Signed)
Pants, shirt, blue jacket, cell phone and eye glasses placed in pt belonging bag at pt bedside.

## 2016-05-13 NOTE — Progress Notes (Signed)
Patient intubated with a #7.5 oral ET tube by Dr Don PerkingVeronese using the Glidescope. Tube was seen passing through the cords, there was positive color change on the Co2 detector, Bilateral breath sounds were auscultated, and condensation was present in the tube with manual ventilation. Tube was secured with 22cm being at the lower lip and patient was placed on the Trilogy ventilator at the following settings: Mode: AC Vt: 550 RR: 12 FIO2: 50% PEEP =5 cmh20 Patients saturation is 100% and she is tol well at this time, will continue to monitor.

## 2016-05-13 NOTE — ED Triage Notes (Signed)
Arrived via EMS , reports going to MD office for edema in legs and abd and increased sob.  EMS states that pt was a&o and walking to stretcher then on arrival pt became less alert and mumbling.

## 2016-05-13 NOTE — Progress Notes (Signed)
*  PRELIMINARY RESULTS* Echocardiogram 2D Echocardiogram has been performed.  Sheila Hayes, Sheila Hayes 05/13/2016, 3:10 PM

## 2016-05-13 NOTE — Progress Notes (Signed)
Patient transported from ER to ICU room 7 on the trilogy transport ventilator with no complications noted. Patient was placed on the SERO-I vent once in ICU at same settings of AC of 12, 550, 35%, and +5 PEEP. Patient tolerating well and will continue to monitor.

## 2016-05-13 NOTE — Progress Notes (Signed)
Spoke with NP on the floor about patient's low blood pressure, systolic blood pressure in 80's and MAP less than 65. Patient is resting in bed comfortably, intubated, heart rate is at baseline for patient. Received new order, will continue to monitor the patient and assess during new intervention and post intervention.

## 2016-05-14 ENCOUNTER — Inpatient Hospital Stay: Payer: Medicare Other

## 2016-05-14 ENCOUNTER — Encounter: Payer: Self-pay | Admitting: *Deleted

## 2016-05-14 DIAGNOSIS — J810 Acute pulmonary edema: Secondary | ICD-10-CM

## 2016-05-14 DIAGNOSIS — Z9911 Dependence on respirator [ventilator] status: Secondary | ICD-10-CM

## 2016-05-14 DIAGNOSIS — J441 Chronic obstructive pulmonary disease with (acute) exacerbation: Secondary | ICD-10-CM

## 2016-05-14 LAB — BASIC METABOLIC PANEL
Anion gap: 7 (ref 5–15)
BUN: 38 mg/dL — AB (ref 6–20)
CALCIUM: 7.5 mg/dL — AB (ref 8.9–10.3)
CO2: 29 mmol/L (ref 22–32)
CREATININE: 1.34 mg/dL — AB (ref 0.44–1.00)
Chloride: 102 mmol/L (ref 101–111)
GFR calc Af Amer: 44 mL/min — ABNORMAL LOW (ref >60)
GFR, EST NON AFRICAN AMERICAN: 38 mL/min — AB (ref >60)
GLUCOSE: 224 mg/dL — AB (ref 65–99)
Potassium: 3.9 mmol/L (ref 3.5–5.1)
Sodium: 138 mmol/L (ref 135–145)

## 2016-05-14 LAB — BLOOD GAS, ARTERIAL
Acid-Base Excess: 2.1 mmol/L — ABNORMAL HIGH (ref 0.0–2.0)
Allens test (pass/fail): POSITIVE — AB
Bicarbonate: 30.3 mmol/L — ABNORMAL HIGH (ref 20.0–28.0)
DRAWN BY: 382351
FIO2: 35
MECHVT: 500 mL
O2 SAT: 89.8 %
PEEP: 5 cmH2O
PH ART: 7.29 — AB (ref 7.350–7.450)
Patient temperature: 37
RATE: 15 resp/min
pCO2 arterial: 63 mmHg — ABNORMAL HIGH (ref 32.0–48.0)
pO2, Arterial: 65 mmHg — ABNORMAL LOW (ref 83.0–108.0)

## 2016-05-14 LAB — GLUCOSE, CAPILLARY
GLUCOSE-CAPILLARY: 115 mg/dL — AB (ref 65–99)
GLUCOSE-CAPILLARY: 123 mg/dL — AB (ref 65–99)
GLUCOSE-CAPILLARY: 209 mg/dL — AB (ref 65–99)
Glucose-Capillary: 195 mg/dL — ABNORMAL HIGH (ref 65–99)
Glucose-Capillary: 200 mg/dL — ABNORMAL HIGH (ref 65–99)
Glucose-Capillary: 224 mg/dL — ABNORMAL HIGH (ref 65–99)
Glucose-Capillary: 156 mg/dL — ABNORMAL HIGH (ref 65–99)

## 2016-05-14 LAB — CBC
HCT: 39.8 % (ref 35.0–47.0)
Hemoglobin: 12.6 g/dL (ref 12.0–16.0)
MCH: 28.5 pg (ref 26.0–34.0)
MCHC: 31.8 g/dL — ABNORMAL LOW (ref 32.0–36.0)
MCV: 89.6 fL (ref 80.0–100.0)
PLATELETS: 200 10*3/uL (ref 150–440)
RBC: 4.44 MIL/uL (ref 3.80–5.20)
RDW: 14.3 % (ref 11.5–14.5)
WBC: 7.2 10*3/uL (ref 3.6–11.0)

## 2016-05-14 LAB — TROPONIN I: Troponin I: <0.03 ng/mL (ref <0.03)

## 2016-05-14 LAB — PHOSPHORUS: PHOSPHORUS: 3.5 mg/dL (ref 2.5–4.6)

## 2016-05-14 LAB — PROCALCITONIN: Procalcitonin: 0.27 ng/mL

## 2016-05-14 LAB — MAGNESIUM: MAGNESIUM: 1.7 mg/dL (ref 1.7–2.4)

## 2016-05-14 MED ORDER — METHYLPREDNISOLONE SODIUM SUCC 40 MG IJ SOLR
40.0000 mg | INTRAMUSCULAR | Status: DC
  Administered 2016-05-14: 40 mg via INTRAVENOUS
  Filled 2016-05-14: qty 1

## 2016-05-14 NOTE — Progress Notes (Signed)
DURING ASSESSMENT, PATIENT'S ARMS WERE NOTICED SHAKING. PATIENT DENIED FEELING COOL. NP ON DUTY WAS NOTIFIED. ALSO, OG/NG TUBE WAS 52 AT THE LIPS. BUT IT WAS PREVIOUSLY CHARTED THAT IT WAS 55 AT THE LIPS. STAT X-RAY WAS ORDERED TO VERIFY PLACEMENT.

## 2016-05-14 NOTE — Progress Notes (Signed)
PULMONARY / CRITICAL CARE MEDICINE   Name: Sheila ModenaSarah F Hayes MRN: [id number] DOB: [birth year]    ADMISSION DATE:  05/13/2016   Discussion: 75 yo Female with Hx of COPD, Pulmonary HTN, DM,HTN and CHF presented with acute on chronic  hypercapnic/hypoxic respiratory failure secondary to CHF vs AECOPD.  Patient is intubated and mechanically ventilated.    ASSESSMENT / PLAN:  PULMONARY A: Acute on chronic hypercapnic hypoxic respiratory failure secondary to AECOPD vs. Pulmonary Edema. Hx: Pulmonary Hypertension and Chronic Diastolic CHF PRVC/18/500/5/35% CXR image reviewed 11/23; pulm edema; ETT slightly low but not main stem.  P:   Weaning trial today.  IV steroids-- decrease.  IV lasix as blood pressure tolerates Continue bronchodilators Routine ABG  Routine CXRC  CARDIOVASCULAR A: Hypotension, improved.  Pulmonary edema, with elevated BNP.  P:  Maintain MAP>60 Gentle diuresis.  Hold outpatient metoprolol Continue aspirin Continuous telemetry monitoring  RENAL A:   Acute renal failure likely secondary to hypotension P:   Trend BMP's Replace electrolytes as indicated Monitor closely I/O Renal function improving  GASTROINTESTINAL A:   No acute issues P:   Keep NPO for now Pepcid for PUD prophylaxis  HEMATOLOGIC A:   No acute issues  P:  Subq heparin for VTE prophylaxis Monitor for s/sx of bleeding Trend CBC's Transfuse for hgb <7  INFECTIOUS A:   Mild leukocytosis P:   Trend WBC's and monitor fever curve Trend PCT's Continue abx as listed above Follow cultures  CULTURES: Blood 11/22>>NTD Sputum 11/22>>negative thus far.  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   ANTIBIOTICS: Azithromycin 11/22>> Ceftriaxone 11/22>>x1 dose  ENDOCRINE A:   Type II DM Hx: Hypothyroidism  P:   SSI  Hyper/hypoglycemic protocol  NEUROLOGIC A:   Hx: Depression P:   RASS goal: 0 to -1 Fentanyl gtt, prn fentanyl and versed to maintain RASS goal WUA daily Lights on  during the day   LINES/TUBES: None  SIGNIFICANT EVENTS: 11/22-Pt admitted to ICU due to acute on chronic hypercapnic hypoxic respiratory failure secondary to AECOPD vs. Pulmonary Edema requiring mechanical ventilation   STUDIES:  05/13/16 ECHO>>The estimated ejection fraction was in the range of 55% to 65%.  --------------------------------------------------------  SUBJECTIVE:  Patient remains  Lightly sedated and on mechanical ventilation. Has diuresed well.. Afebrile overnight. Had an uneventful night.  VITAL SIGNS: BP (!) 92/43   Pulse 86   Temp 99.1 F (37.3 C) (Axillary)   Resp 18   Ht 5\' 7"  (1.702 m)   Wt 118.8 kg (261 lb 14.5 oz)   SpO2 92%   BMI 41.02 kg/m   HEMODYNAMICS:    VENTILATOR SETTINGS: Vent Mode: PRVC FiO2 (%):  [35 %-50 %] 35 % Set Rate:  [12 bmp-18 bmp] 18 bmp Vt Set:  [500 mL-550 mL] 500 mL PEEP:  [5 cmH20] 5 cmH20 Plateau Pressure:  [10 cmH20-20 cmH20] 20 cmH20  INTAKE / OUTPUT: I/O last 3 completed shifts: In: 554.5 [I.V.:254.5; IV Piggyback:300] Out: 1240 [Urine:1240]  PHYSICAL EXAMINATION: General:  Acutely ill appearing obese female Neuro:  Sedated nods appropriately, PERRL HEENT:  Supple, no JVD Cardiovascular:  S1S2, RRR, no M/R/G, 3+ pitting bilateral lower extremity edema Lungs:  Scattered Rhonchi, no wheezes, noted Abdomen:  Obese, hypoactive BS x4, soft Musculoskeletal:  Normal tone  Skin: scattered ecchymosis no rashes or lesions   LABS:  BMET  Recent Labs Lab 05/13/16 0916 05/14/16 0350  NA 137 138  K 5.0 3.9  CL 98* 102  CO2 31 29  BUN 43* 38*  CREATININE 1.40*  1.34*  GLUCOSE 215* 224*    Electrolytes  Recent Labs Lab 05/13/16 0916 05/14/16 0350  CALCIUM 8.8* 7.5*  MG  --  1.7  PHOS  --  3.5    CBC  Recent Labs Lab 05/13/16 0916 05/14/16 0350  WBC 13.0* 7.2  HGB 14.8 12.6  HCT 47.3* 39.8  PLT 324 200    Coag's No results for input(s): APTT, INR in the last 168 hours.  Sepsis  Markers  Recent Labs Lab 05/13/16 0916 05/14/16 0350  PROCALCITON 0.12 0.27    ABG  Recent Labs Lab 05/13/16 0923 05/13/16 1407 05/14/16 0418  PHART 7.22* 7.24* 7.29*  PCO2ART 72* 62* 63*  PO2ART 65* 61* 65*    Liver Enzymes  Recent Labs Lab 05/13/16 0916  AST 33  ALT 34  ALKPHOS 83  BILITOT 0.9  ALBUMIN 3.8    Cardiac Enzymes  Recent Labs Lab 05/13/16 1512 05/13/16 2146 05/14/16 0350  TROPONINI <0.03 <0.03 <0.03    Glucose  Recent Labs Lab 05/13/16 1318 05/13/16 1702 05/13/16 2009 05/13/16 2358 05/14/16 0356  GLUCAP 166* 204* 260* 209* 195*    Imaging Dg Chest Portable 1 View  Result Date: 05/13/2016 CLINICAL DATA:  Intubation EXAM: PORTABLE CHEST 1 VIEW COMPARISON:  01/23/2014 FINDINGS: Endotracheal tube is within the right mainstem bronchus and should be retracted 3-4 cm. NG tube enters the stomach. Left lower lobe atelectasis. Mild cardiomegaly and vascular congestion. No visible effusions. IMPRESSION: Right mainstem intubation with left lower lobe atelectasis. Recommend retracting endotracheal tube 3-4 cm. These results were called by telephone at the time of interpretation on 05/13/2016 at 9:44 am to Dr. Nita SickleAROLINA VERONESE , who verbally acknowledged these results. Electronically Signed   By: Charlett NoseKevin  Dover M.D.   On: 05/13/2016 09:46    Deep Nicholos Johnsamachandran, M.D.  05/14/2016  Critical Care Attestation.  I have personally obtained a history, examined the patient, evaluated laboratory and imaging results, formulated the assessment and plan and placed orders. The Patient requires high complexity decision making for assessment and support, frequent evaluation and titration of therapies, application of advanced monitoring technologies and extensive interpretation of multiple databases. The patient has critical illness that could lead imminently to failure of 1 or more organ systems and requires the highest level of physician preparedness to intervene.   Critical Care Time devoted to patient care services described in this note is 35 minutes and is exclusive of time spent in procedures.

## 2016-05-14 NOTE — Progress Notes (Signed)
Patient comfortable with no display of pain on minimal fentanyl dose via infusion.  Denies pain.  Follows commands.  NSR per cardiac monitor.  No respiratory distress on vent.  Failed vent wean trial this morning by dropping o2 sats into 70's.  Sisters and niece called to check on patient during shift.

## 2016-05-15 ENCOUNTER — Inpatient Hospital Stay: Payer: Medicare Other

## 2016-05-15 DIAGNOSIS — J9601 Acute respiratory failure with hypoxia: Secondary | ICD-10-CM

## 2016-05-15 LAB — BLOOD GAS, ARTERIAL
ACID-BASE EXCESS: 6.3 mmol/L — AB (ref 0.0–2.0)
ALLENS TEST (PASS/FAIL): POSITIVE — AB
Bicarbonate: 33.1 mmol/L — ABNORMAL HIGH (ref 20.0–28.0)
FIO2: 35
O2 SAT: 84.2 %
PCO2 ART: 56 mmHg — AB (ref 32.0–48.0)
PEEP: 5 cmH2O
PH ART: 7.38 (ref 7.350–7.450)
Patient temperature: 37
RATE: 18 resp/min
VT: 500 mL
pO2, Arterial: 50 mmHg — ABNORMAL LOW (ref 83.0–108.0)

## 2016-05-15 LAB — BASIC METABOLIC PANEL
Anion gap: 9 (ref 5–15)
BUN: 34 mg/dL — AB (ref 6–20)
CALCIUM: 7.8 mg/dL — AB (ref 8.9–10.3)
CO2: 30 mmol/L (ref 22–32)
Chloride: 101 mmol/L (ref 101–111)
Creatinine, Ser: 1.21 mg/dL — ABNORMAL HIGH (ref 0.44–1.00)
GFR calc Af Amer: 50 mL/min — ABNORMAL LOW (ref >60)
GFR, EST NON AFRICAN AMERICAN: 43 mL/min — AB (ref >60)
GLUCOSE: 189 mg/dL — AB (ref 65–99)
Potassium: 3.8 mmol/L (ref 3.5–5.1)
Sodium: 140 mmol/L (ref 135–145)

## 2016-05-15 LAB — GLUCOSE, CAPILLARY
GLUCOSE-CAPILLARY: 174 mg/dL — AB (ref 65–99)
GLUCOSE-CAPILLARY: 200 mg/dL — AB (ref 65–99)
GLUCOSE-CAPILLARY: 259 mg/dL — AB (ref 65–99)
Glucose-Capillary: 201 mg/dL — ABNORMAL HIGH (ref 65–99)
Glucose-Capillary: 226 mg/dL — ABNORMAL HIGH (ref 65–99)
Glucose-Capillary: 235 mg/dL — ABNORMAL HIGH (ref 65–99)

## 2016-05-15 LAB — CBC
HEMATOCRIT: 40 % (ref 35.0–47.0)
Hemoglobin: 12.7 g/dL (ref 12.0–16.0)
MCH: 28.2 pg (ref 26.0–34.0)
MCHC: 31.9 g/dL — ABNORMAL LOW (ref 32.0–36.0)
MCV: 88.5 fL (ref 80.0–100.0)
PLATELETS: 203 10*3/uL (ref 150–440)
RBC: 4.51 MIL/uL (ref 3.80–5.20)
RDW: 14.6 % — AB (ref 11.5–14.5)
WBC: 12.5 10*3/uL — ABNORMAL HIGH (ref 3.6–11.0)

## 2016-05-15 LAB — PROCALCITONIN: Procalcitonin: 0.14 ng/mL

## 2016-05-15 MED ORDER — METHYLPREDNISOLONE SODIUM SUCC 40 MG IJ SOLR
40.0000 mg | Freq: Two times a day (BID) | INTRAMUSCULAR | Status: DC
  Administered 2016-05-15 – 2016-05-19 (×9): 40 mg via INTRAVENOUS
  Filled 2016-05-15 (×9): qty 1

## 2016-05-15 MED ORDER — DEXTROSE 5 % IV SOLN
500.0000 mg | INTRAVENOUS | Status: AC
  Administered 2016-05-16: 500 mg via INTRAVENOUS
  Filled 2016-05-15: qty 500

## 2016-05-15 MED ORDER — VITAL HIGH PROTEIN PO LIQD
1000.0000 mL | ORAL | Status: DC
  Administered 2016-05-15 – 2016-05-16 (×3): 1000 mL

## 2016-05-15 MED ORDER — SENNOSIDES 8.8 MG/5ML PO SYRP
5.0000 mL | ORAL_SOLUTION | Freq: Two times a day (BID) | ORAL | Status: DC
  Administered 2016-05-15 – 2016-05-16 (×2): 5 mL
  Filled 2016-05-15 (×4): qty 5

## 2016-05-15 MED ORDER — DOCUSATE SODIUM 50 MG/5ML PO LIQD
100.0000 mg | Freq: Two times a day (BID) | ORAL | Status: DC
  Administered 2016-05-15 – 2016-05-16 (×2): 100 mg
  Filled 2016-05-15 (×4): qty 10

## 2016-05-15 MED ORDER — ENOXAPARIN SODIUM 40 MG/0.4ML ~~LOC~~ SOLN
40.0000 mg | SUBCUTANEOUS | Status: DC
  Administered 2016-05-16 – 2016-05-17 (×2): 40 mg via SUBCUTANEOUS
  Filled 2016-05-15 (×3): qty 0.4

## 2016-05-15 MED ORDER — INSULIN ASPART 100 UNIT/ML ~~LOC~~ SOLN
3.0000 [IU] | SUBCUTANEOUS | Status: DC
  Administered 2016-05-15 – 2016-05-18 (×16): 3 [IU] via SUBCUTANEOUS
  Filled 2016-05-15 (×13): qty 3
  Filled 2016-05-15: qty 7
  Filled 2016-05-15 (×2): qty 3

## 2016-05-15 MED ORDER — LEVOTHYROXINE SODIUM 100 MCG IV SOLR
65.0000 ug | Freq: Every day | INTRAVENOUS | Status: DC
  Administered 2016-05-15 – 2016-05-16 (×2): 65 ug via INTRAVENOUS
  Filled 2016-05-15 (×3): qty 5

## 2016-05-15 NOTE — Progress Notes (Signed)
Pt failed weaning trial early this am, Fentanyl restarted at 50 % dose, and pt rested well, but became agitated when mouth care or turning occurred, so dose increased to hour ( 100 mcg) . Urine output about 1L , though lower extreamities remain very swollen andLEFT foot seems more swollen than RT, awaiting ultrsound of leg. No family has been in to visit, though there have been a few phone calls from relatives. Resting comfortably at this time

## 2016-05-15 NOTE — Progress Notes (Signed)
Nutrition Follow-up  DOCUMENTATION CODES:   Morbid obesity  INTERVENTION:  -Received verbal order from MD Ramachandran to start TF. Recommend starting Vital High Protein at goal of 65 ml/hr providing 1560 kcals, 137 g of protein and 1310 mL of free water. PEPuP initiated. Continue to assess   NUTRITION DIAGNOSIS:   Inadequate oral intake related to acute illness as evidenced by NPO status.  GOAL:   Provide needs based on ASPEN/SCCM guidelines  MONITOR:   Labs, Weight trends, Vent status, TF tolerance  REASON FOR ASSESSMENT:   Ventilator    ASSESSMENT:     75 yo female admitted with acute on chronic respiratory failure secondary to AECOPD vs pulmonary edema  Pt remains on vent support, NPO day 3 OG tube in stomach Labs: reviewed Meds: fentanyl/versed, solumedrol, ss novolog, lasix  Diet Order:  Diet NPO time specified  Skin:  Reviewed, no issues  Last BM:  no documented BM  Height:   Ht Readings from Last 1 Encounters:  05/13/16 5\' 7"  (1.702 m)    Weight:   Wt Readings from Last 1 Encounters:  05/15/16 245 lb 13 oz (111.5 kg)    BMI:  Body mass index is 38.5 kg/m.  Estimated Nutritional Needs:   Kcal:  [id number] kcals  Protein:  >/= 122 g  Fluid:  >/= 1.5 L  EDUCATION NEEDS:   No education needs identified at this time  Romelle StarcherCate Tasheem Elms MS, RD, LDN [phone] Pager  [phone] Weekend/On-Call Pager

## 2016-05-15 NOTE — Progress Notes (Signed)
PULMONARY / CRITICAL CARE MEDICINE   Name: Sheila Hayes MRN: [id number] DOB: 03-16-1941    ADMISSION DATE:  05/13/2016   Discussion: 75 yo Female with Hx of COPD, Pulmonary HTN, DM,HTN and CHF presented with acute on chronic  hypercapnic/hypoxic respiratory failure secondary to CHF vs AECOPD.  Patient is intubated and mechanically ventilated.  ASSESSMENT / PLAN:  PULMONARY A: Acute on chronic hypercapnic hypoxic respiratory failure secondary to AECOPD vs. Pulmonary Edema. Hx: Pulmonary Hypertension and Chronic Diastolic CHF PRVC/18/500/5/35% CXR image reviewed 11/23; pulm edema; ETT slightly low but not main stem.  P:   Weaning trial today IV steroids-- decreased IV lasix as blood pressure tolerates Continue bronchodilators Routine ABG Routine CXR  CARDIOVASCULAR A: Hypotension-resolved Sinus tachycardia Pulmonary edema, with elevated BNP P:  Maintain MAP>60 Gentle diuresis.  Hold outpatient metoprolol Continue aspirin Continuous telemetry monitoring  RENAL A:   Acute renal failure likely secondary to hypotension-improving P:   Trend BMP's Replace electrolytes as indicated Monitor closely I/O  GASTROINTESTINAL A:   No acute issues P:   Start tube feedings today  Pepcid for PUD prophylaxis  HEMATOLOGIC A:   No acute issues  P:  Subq heparin for VTE prophylaxis Monitor for s/sx of bleeding Trend CBC's Transfuse for hgb <7  INFECTIOUS A:   Mild leukocytosis P:   Trend WBC's and monitor fever curve Trend PCT's Continue abx as listed above Follow cultures  CULTURES: Blood 11/22>>NTD Sputum 11/22>>negative thus far.  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  ANTIBIOTICS: Azithromycin 11/22>> Ceftriaxone 11/22>>x1 dose  ENDOCRINE A:   Type II DM Hx: Hypothyroidism  P:   SSI  Hyper/hypoglycemic protocol Start synthroid   NEUROLOGIC A:   Hx: Depression P:   RASS goal: 0 to -1 Fentanyl gtt, prn fentanyl and versed to maintain RASS goal WUA  daily Lights on during the day   LINES/TUBES: None  SIGNIFICANT EVENTS: 11/22-Pt admitted to ICU due to acute on chronic hypercapnic hypoxic respiratory failure secondary to AECOPD vs. Pulmonary Edema requiring mechanical ventilation  STUDIES:  05/13/16 ECHO>>The estimated ejection fraction was in the range of 55% to 65%.  --------------------------------------------------------  SUBJECTIVE:  Patient remains lightly sedated and on mechanical ventilation. Failed spontaneous breathing trial this am became tachypneic immediately and O2 sats decreased, therefore placed back on full vent support  VITAL SIGNS: BP (!) 123/56   Pulse 99   Temp 98.9 F (37.2 C)   Resp 18   Ht 5\' 7"  (1.702 m)   Wt 245 lb 13 oz (111.5 kg)   SpO2 90%   BMI 38.50 kg/m   HEMODYNAMICS:    VENTILATOR SETTINGS: Vent Mode: PRVC FiO2 (%):  [35 %-50 %] 35 % Set Rate:  [18 bmp] 18 bmp Vt Set:  [500 mL] 500 mL PEEP:  [5 cmH20] 5 cmH20 Plateau Pressure:  [13 cmH20-19 cmH20] 18 cmH20  INTAKE / OUTPUT: I/O last 3 completed shifts: In: 1571.9 [I.V.:631.9; NG/GT:40; IV Piggyback:900] Out: 2710 [Urine:2710]  PHYSICAL EXAMINATION: General:  Acutely ill appearing obese female Neuro:  Sedated follows commands, PERRL HEENT:  Supple, no JVD Cardiovascular:  Sinus tach, s1s2, RRR, no M/R/G, 3+ pitting left lower extremity edema, 2+ generalized edema Lungs:  Scattered rhonchi and diminished throughout, no wheezes noted Abdomen:  Obese, hypoactive BS x4, soft Musculoskeletal: moves all extremities, normal tone Skin: scattered ecchymosis no rashes or lesions   LABS:  BMET  Recent Labs Lab 05/13/16 0916 05/14/16 0350 05/15/16 0530  NA 137 138 140  K 5.0 3.9 3.8  CL 98* 102 101  CO2 31 29 30   BUN 43* 38* 34*  CREATININE 1.40* 1.34* 1.21*  GLUCOSE 215* 224* 189*    Electrolytes  Recent Labs Lab 05/13/16 0916 05/14/16 0350 05/15/16 0530  CALCIUM 8.8* 7.5* 7.8*  MG  --  1.7  --   PHOS  --  3.5   --     CBC  Recent Labs Lab 05/13/16 0916 05/14/16 0350 05/15/16 0530  WBC 13.0* 7.2 12.5*  HGB 14.8 12.6 12.7  HCT 47.3* 39.8 40.0  PLT 324 200 203    Coag's No results for input(s): APTT, INR in the last 168 hours.  Sepsis Markers  Recent Labs Lab 05/13/16 0916 05/14/16 0350 05/15/16 0530  PROCALCITON 0.12 0.27 0.14    ABG  Recent Labs Lab 05/13/16 1407 05/14/16 0418 05/15/16 0511  PHART 7.24* 7.29* 7.38  PCO2ART 62* 63* 56*  PO2ART 61* 65* 50*    Liver Enzymes  Recent Labs Lab 05/13/16 0916  AST 33  ALT 34  ALKPHOS 83  BILITOT 0.9  ALBUMIN 3.8    Cardiac Enzymes  Recent Labs Lab 05/13/16 1512 05/13/16 2146 05/14/16 0350  TROPONINI <0.03 <0.03 <0.03    Glucose  Recent Labs Lab 05/14/16 1205 05/14/16 1614 05/14/16 1949 05/14/16 2324 05/15/16 0401 05/15/16 0827  GLUCAP 224* 156* 123* 115* 174* 200*    Imaging Dg Chest 1 View  Result Date: 05/15/2016 CLINICAL DATA:  Dyspnea. EXAM: CHEST 1 VIEW COMPARISON:  05/14/2016 FINDINGS: Endotracheal tube tip measures 4 cm above the carina. Enteric tube tip is off the field of view but below the left hemidiaphragm. Cardiac enlargement with pulmonary vascular congestion. Left perihilar and basilar infiltration is increasing since previous study and could represent asymmetrical edema, pneumonia, or layering of pleural effusion. Small left pleural effusion is present. No pneumothorax. IMPRESSION: Increasing infiltration on the left may indicate asymmetric edema, pneumonia, or layering of left pleural effusion. Cardiac enlargement with pulmonary vascular congestion. Appliances in satisfactory position. Electronically Signed   By: Burman NievesWilliam  Stevens M.D.   On: 05/15/2016 06:11   Dg Abd 1 View  Result Date: 05/14/2016 CLINICAL DATA:  Evaluate OG tube placement. EXAM: ABDOMEN - 1 VIEW COMPARISON:  None. FINDINGS: Evaluation of the OG tube is limited due to poor penetration. However, it terminates in  the left abdomen. The side port is not clearly visualized but the tip appears to be in the left upper quadrant. Left pleural effusion and underlying atelectasis. IMPRESSION: Poor visualization of the OG tube. The side port is not well seen. The distal tip is in the left upper quadrant of the abdomen. Electronically Signed   By: Gerome Samavid  Williams III M.D   On: 05/14/2016 20:48   Update: No family at bedside to update at this time 05/15/2106  Sonda Rumbleana Blakeney, AGNP  Pulmonary/Critical Care Pager [phone] (please enter 7 digits) PCCM Consult Pager [phone] (please enter 7 digits)   Patient seen and examined with NP, agree with assessment and plan as amended above.  Continue acute respiratory failure, with decreased air entry bilaterally. Failed weaning trial today, will continue to wean as tolerated.   Wells Guiles-Deep Nate Perri, M.D.  05/15/2016   Critical Care Attestation.  I have personally obtained a history, examined the patient, evaluated laboratory and imaging results, formulated the assessment and plan and placed orders. The Patient requires high complexity decision making for assessment and support, frequent evaluation and titration of therapies, application of advanced monitoring technologies and extensive interpretation of multiple databases. The patient  has critical illness that could lead imminently to failure of 1 or more organ systems and requires the highest level of physician preparedness to intervene.  Critical Care Time devoted to patient care services described in this note is 35 minutes and is exclusive of time spent in procedures.

## 2016-05-16 ENCOUNTER — Inpatient Hospital Stay: Payer: Medicare Other

## 2016-05-16 DIAGNOSIS — J189 Pneumonia, unspecified organism: Principal | ICD-10-CM

## 2016-05-16 DIAGNOSIS — J9602 Acute respiratory failure with hypercapnia: Secondary | ICD-10-CM

## 2016-05-16 LAB — BASIC METABOLIC PANEL
Anion gap: 7 (ref 5–15)
BUN: 28 mg/dL — AB (ref 6–20)
CALCIUM: 7.7 mg/dL — AB (ref 8.9–10.3)
CO2: 32 mmol/L (ref 22–32)
CREATININE: 0.89 mg/dL (ref 0.44–1.00)
Chloride: 101 mmol/L (ref 101–111)
GFR calc Af Amer: >60 mL/min (ref >60)
GLUCOSE: 255 mg/dL — AB (ref 65–99)
Potassium: 3.9 mmol/L (ref 3.5–5.1)
SODIUM: 140 mmol/L (ref 135–145)

## 2016-05-16 LAB — EXPECTORATED SPUTUM ASSESSMENT W GRAM STAIN, RFLX TO RESP C

## 2016-05-16 LAB — GLUCOSE, CAPILLARY
GLUCOSE-CAPILLARY: 264 mg/dL — AB (ref 65–99)
GLUCOSE-CAPILLARY: 287 mg/dL — AB (ref 65–99)
Glucose-Capillary: 198 mg/dL — ABNORMAL HIGH (ref 65–99)
Glucose-Capillary: 249 mg/dL — ABNORMAL HIGH (ref 65–99)
Glucose-Capillary: 265 mg/dL — ABNORMAL HIGH (ref 65–99)
Glucose-Capillary: 288 mg/dL — ABNORMAL HIGH (ref 65–99)

## 2016-05-16 LAB — CBC WITH DIFFERENTIAL/PLATELET
Basophils Absolute: 0 10*3/uL (ref 0–0.1)
Basophils Relative: 0 %
EOS ABS: 0 10*3/uL (ref 0–0.7)
EOS PCT: 0 %
HCT: 41 % (ref 35.0–47.0)
Hemoglobin: 13.1 g/dL (ref 12.0–16.0)
LYMPHS ABS: 0.3 10*3/uL — AB (ref 1.0–3.6)
LYMPHS PCT: 4 %
MCH: 28.7 pg (ref 26.0–34.0)
MCHC: 32 g/dL (ref 32.0–36.0)
MCV: 89.8 fL (ref 80.0–100.0)
MONO ABS: 0.1 10*3/uL — AB (ref 0.2–0.9)
Monocytes Relative: 2 %
Neutro Abs: 7.8 10*3/uL — ABNORMAL HIGH (ref 1.4–6.5)
Neutrophils Relative %: 94 %
PLATELETS: 196 10*3/uL (ref 150–440)
RBC: 4.56 MIL/uL (ref 3.80–5.20)
RDW: 14.5 % (ref 11.5–14.5)
WBC: 8.2 10*3/uL (ref 3.6–11.0)

## 2016-05-16 LAB — MAGNESIUM: Magnesium: 1.7 mg/dL (ref 1.7–2.4)

## 2016-05-16 LAB — PHOSPHORUS: PHOSPHORUS: 2.3 mg/dL — AB (ref 2.5–4.6)

## 2016-05-16 LAB — EXPECTORATED SPUTUM ASSESSMENT W REFEX TO RESP CULTURE

## 2016-05-16 MED ORDER — VANCOMYCIN HCL IN DEXTROSE 1-5 GM/200ML-% IV SOLN
1000.0000 mg | Freq: Once | INTRAVENOUS | Status: AC
  Administered 2016-05-16: 1000 mg via INTRAVENOUS
  Filled 2016-05-16: qty 200

## 2016-05-16 MED ORDER — VANCOMYCIN HCL IN DEXTROSE 1-5 GM/200ML-% IV SOLN
1000.0000 mg | Freq: Two times a day (BID) | INTRAVENOUS | Status: DC
  Administered 2016-05-16 – 2016-05-20 (×8): 1000 mg via INTRAVENOUS
  Filled 2016-05-16 (×10): qty 200

## 2016-05-16 NOTE — Progress Notes (Signed)
Pharmacy Antibiotic Note  Sheila Hayes is a 75 y.o. female admitted on 05/13/2016 with COPD exacerbation. Now bing treated for PNA.  Pharmacy has been consulted for Vancomycin dosing.  Patient also receiving Azithromycin.  Plan: DW: 81kg   CrCl: 3970ml/min   KE: 0.063  T1/2: 11  Ke: 0.063   Vd: 57  Will start the patient on Vancomycin 1gm IV every 12 hours with 6 hours stack dosing.  Trough ordered prior to 5th dose. Calculated trough at Css 16.    Height: 5\' 7"  (170.2 cm) Weight: 245 lb 6 oz (111.3 kg) IBW/kg (Calculated) : 61.6  Temp (24hrs), Avg:98.7 F (37.1 C), Min:97.8 F (36.6 C), Max:99.3 F (37.4 C)   Recent Labs Lab 05/13/16 0916 05/14/16 0350 05/15/16 0530 05/16/16 0455  WBC 13.0* 7.2 12.5* 8.2  CREATININE 1.40* 1.34* 1.21* 0.89    Estimated Creatinine Clearance: 71.4 mL/min (by C-G formula based on SCr of 0.89 mg/dL).    No Known Allergies  Antimicrobials this admission: 11/23 azithromycin >>  11/25 Vancomycin  >>  Dose adjustments this admission:   Microbiology results:  11/22 MRSA PCR: positive  Thank you for allowing pharmacy to be a part of this patient's care.  Sheila Hayes, PharmD Clinical Pharmacist  05/16/2016 12:32 PM

## 2016-05-16 NOTE — Progress Notes (Signed)
Pt failed weaning trial today as sats decreased rapidly.  Vent setting restored.Remains comfortable on 100 cgs of Fentanyl , OG tube in place, Tube feeds in place, foley catheter in place, and goof diuresis noted. Legs remain edematous. No family in to see, x 1 phone call and family member updated

## 2016-05-16 NOTE — Progress Notes (Signed)
PULMONARY / CRITICAL CARE MEDICINE   Name: Sheila Hayes MRN: [id number] DOB: September 12, 1940    ADMISSION DATE:  05/13/2016   Discussion: 75 yo Female with Hx of COPD, Pulmonary HTN, DM,HTN and CHF presented with acute on chronic  hypercapnic/hypoxic respiratory failure secondary to  Pneumonia/AECOPD.  Patient is intubated and mechanically ventilated.    ASSESSMENT / PLAN:  PULMONARY A: Acute on chronic hypercapnic hypoxic respiratory failure secondary to AECOPD vs. Pulmonary Edema. Hx: Pulmonary Hypertension and Chronic Diastolic CHF PRVC/18/500/5/35% CXR image reviewed 11/23; pulm edema; ETT slightly low but not main stem.  P:   Weaning trial today.  IV steroids-- decrease.  IV lasix as blood pressure tolerates Continue bronchodilators Routine ABG  Routine CXR  CARDIOVASCULAR A: Hypotension, improved.  Pulmonary edema, with elevated BNP.  P:  Maintain MAP>60 Gentle diuresis.  Hold outpatient metoprolol Continue aspirin Continuous telemetry monitoring  RENAL A:   Acute renal failure likely secondary to hypotension P:   Trend BMP's Replace electrolytes as indicated Monitor closely I/O Renal function improving  GASTROINTESTINAL A:   No acute issues P:   Keep NPO for now Pepcid for PUD prophylaxis  HEMATOLOGIC A:   No acute issues  P:  Subq heparin for VTE prophylaxis Monitor for s/sx of bleeding Trend CBC's Transfuse for hgb <7  INFECTIOUS A:   Mild leukocytosis P:   Trend WBC's and monitor fever curve Trend PCT's Continue abx as listed above Follow cultures  CULTURES: Blood 11/22>>NTD Sputum 11/22>>ordered.  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   ANTIBIOTICS: Azithromycin 11/22>> Ceftriaxone 11/22>>x1 dose Vancomycin 11/25>>  ENDOCRINE A:   Type II DM Hx: Hypothyroidism  P:   SSI  Hyper/hypoglycemic protocol  NEUROLOGIC A:   Hx: Depression P:   RASS goal: 0 to -1 Fentanyl gtt, prn fentanyl and versed to maintain RASS  goal   LINES/TUBES: None  SIGNIFICANT EVENTS: 11/22-Pt admitted to ICU due to acute on chronic hypercapnic hypoxic respiratory failure secondary to AECOPD vs. Pulmonary Edema requiring mechanical ventilation   STUDIES:  05/13/16 ECHO>>The estimated ejection fraction was in the range of 55% to 65%.  --------------------------------------------------------  SUBJECTIVE:  Patient remains  Lightly sedated and on mechanical ventilation. Has diuresed well.. Afebrile overnight. Had an uneventful night.  VITAL SIGNS: BP 136/75   Pulse 95   Temp 98.6 F (37 C)   Resp 12   Ht 5\' 7"  (1.702 m)   Wt 111.3 kg (245 lb 6 oz)   SpO2 95%   BMI 38.43 kg/m   HEMODYNAMICS:    VENTILATOR SETTINGS: Vent Mode: PRVC FiO2 (%):  [35 %] 35 % Set Rate:  [18 bmp] 18 bmp Vt Set:  [500 mL] 500 mL PEEP:  [5 cmH20] 5 cmH20 Plateau Pressure:  [16 cmH20] 16 cmH20  INTAKE / OUTPUT: I/O last 3 completed shifts: In: 1773.4 [I.V.:297.9; NG/GT:1325.5; IV Piggyback:150] Out: 4115 [Urine:4115]  PHYSICAL EXAMINATION: General:  Acutely ill appearing obese female Neuro:  Sedated nods appropriately, PERRL HEENT:  Supple, no JVD Cardiovascular:  S1S2, RRR, no M/R/G, 3+ pitting bilateral lower extremity edema Lungs:  Scattered Rhonchi, no wheezes, noted Abdomen:  Obese, hypoactive BS x4, soft Musculoskeletal:  Normal tone  Skin: scattered ecchymosis no rashes or lesions   LABS:  BMET  Recent Labs Lab 05/14/16 0350 05/15/16 0530 05/16/16 0455  NA 138 140 140  K 3.9 3.8 3.9  CL 102 101 101  CO2 29 30 32  BUN 38* 34* 28*  CREATININE 1.34* 1.21* 0.89  GLUCOSE 224* 189*  255*    Electrolytes  Recent Labs Lab 05/14/16 0350 05/15/16 0530 05/16/16 0455  CALCIUM 7.5* 7.8* 7.7*  MG 1.7  --  1.7  PHOS 3.5  --  2.3*    CBC  Recent Labs Lab 05/14/16 0350 05/15/16 0530 05/16/16 0455  WBC 7.2 12.5* 8.2  HGB 12.6 12.7 13.1  HCT 39.8 40.0 41.0  PLT 200 203 196    Coag's No results for  input(s): APTT, INR in the last 168 hours.  Sepsis Markers  Recent Labs Lab 05/13/16 0916 05/14/16 0350 05/15/16 0530  PROCALCITON 0.12 0.27 0.14    ABG  Recent Labs Lab 05/13/16 1407 05/14/16 0418 05/15/16 0511  PHART 7.24* 7.29* 7.38  PCO2ART 62* 63* 56*  PO2ART 61* 65* 50*    Liver Enzymes  Recent Labs Lab 05/13/16 0916  AST 33  ALT 34  ALKPHOS 83  BILITOT 0.9  ALBUMIN 3.8    Cardiac Enzymes  Recent Labs Lab 05/13/16 1512 05/13/16 2146 05/14/16 0350  TROPONINI <0.03 <0.03 <0.03    Glucose  Recent Labs Lab 05/15/16 1624 05/15/16 1928 05/15/16 2351 05/16/16 0402 05/16/16 0754 05/16/16 1133  GLUCAP 235* 259* 226* 264* 249* 288*    Imaging Dg Chest Port 1 View  Result Date: 05/16/2016 CLINICAL DATA:  Acute respiratory failure EXAM: PORTABLE CHEST 1 VIEW COMPARISON:  05/15/2016 FINDINGS: Cardiac shadow is mildly enlarged but stable. An endotracheal tube and nasogastric catheter are again seen and stable. Right lung remains clear. Persistent left effusion and left retrocardiac consolidation is seen. No bony abnormality is noted. IMPRESSION: Persistent left basilar changes.  No new acute abnormality is noted. Electronically Signed   By: Alcide CleverMark  Lukens M.D.   On: 05/16/2016 07:20    Deep Nicholos Johnsamachandran, M.D.  05/16/2016  Critical Care Attestation.  I have personally obtained a history, examined the patient, evaluated laboratory and imaging results, formulated the assessment and plan and placed orders. The Patient requires high complexity decision making for assessment and support, frequent evaluation and titration of therapies, application of advanced monitoring technologies and extensive interpretation of multiple databases. The patient has critical illness that could lead imminently to failure of 1 or more organ systems and requires the highest level of physician preparedness to intervene.  Critical Care Time devoted to patient care services described  in this note is 35 minutes and is exclusive of time spent in procedures.

## 2016-05-17 ENCOUNTER — Inpatient Hospital Stay: Payer: Medicare Other

## 2016-05-17 LAB — CBC
HCT: 45.9 % (ref 35.0–47.0)
HEMOGLOBIN: 14.6 g/dL (ref 12.0–16.0)
MCH: 28.1 pg (ref 26.0–34.0)
MCHC: 31.9 g/dL — ABNORMAL LOW (ref 32.0–36.0)
MCV: 88.1 fL (ref 80.0–100.0)
Platelets: 205 10*3/uL (ref 150–440)
RBC: 5.21 MIL/uL — AB (ref 3.80–5.20)
RDW: 14.5 % (ref 11.5–14.5)
WBC: 9.8 10*3/uL (ref 3.6–11.0)

## 2016-05-17 LAB — BLOOD GAS, ARTERIAL
Acid-Base Excess: 17.2 mmol/L — ABNORMAL HIGH (ref 0.0–2.0)
Allens test (pass/fail): POSITIVE — AB
BICARBONATE: 47.4 mmol/L — AB (ref 20.0–28.0)
FIO2: 0.35
Mode: POSITIVE
O2 SAT: 92.1 %
PATIENT TEMPERATURE: 37
PEEP/CPAP: 5 cmH2O
PH ART: 7.37 (ref 7.350–7.450)
PRESSURE SUPPORT: 5 cmH2O
pCO2 arterial: 82 mmHg (ref 32.0–48.0)
pO2, Arterial: 66 mmHg — ABNORMAL LOW (ref 83.0–108.0)

## 2016-05-17 LAB — BASIC METABOLIC PANEL
Anion gap: 6 (ref 5–15)
BUN: 28 mg/dL — AB (ref 6–20)
CHLORIDE: 95 mmol/L — AB (ref 101–111)
CO2: 39 mmol/L — ABNORMAL HIGH (ref 22–32)
Calcium: 8.3 mg/dL — ABNORMAL LOW (ref 8.9–10.3)
Creatinine, Ser: 0.93 mg/dL (ref 0.44–1.00)
GFR calc Af Amer: >60 mL/min (ref >60)
GFR calc non Af Amer: 59 mL/min — ABNORMAL LOW (ref >60)
GLUCOSE: 245 mg/dL — AB (ref 65–99)
POTASSIUM: 4.1 mmol/L (ref 3.5–5.1)
Sodium: 140 mmol/L (ref 135–145)

## 2016-05-17 LAB — GLUCOSE, CAPILLARY
GLUCOSE-CAPILLARY: 172 mg/dL — AB (ref 65–99)
GLUCOSE-CAPILLARY: 263 mg/dL — AB (ref 65–99)
Glucose-Capillary: 265 mg/dL — ABNORMAL HIGH (ref 65–99)
Glucose-Capillary: 155 mg/dL — ABNORMAL HIGH (ref 65–99)
Glucose-Capillary: 256 mg/dL — ABNORMAL HIGH (ref 65–99)
Glucose-Capillary: 252 mg/dL — ABNORMAL HIGH (ref 65–99)

## 2016-05-17 LAB — MAGNESIUM: MAGNESIUM: 1.7 mg/dL (ref 1.7–2.4)

## 2016-05-17 LAB — PHOSPHORUS: Phosphorus: 2.2 mg/dL — ABNORMAL LOW (ref 2.5–4.6)

## 2016-05-17 MED ORDER — SENNA 8.6 MG PO TABS
1.0000 | ORAL_TABLET | Freq: Every day | ORAL | Status: DC
  Administered 2016-05-17 – 2016-05-19 (×3): 8.6 mg via ORAL
  Filled 2016-05-17 (×3): qty 1

## 2016-05-17 MED ORDER — ORAL CARE MOUTH RINSE
15.0000 mL | Freq: Two times a day (BID) | OROMUCOSAL | Status: DC
  Administered 2016-05-18 – 2016-05-20 (×5): 15 mL via OROMUCOSAL

## 2016-05-17 MED ORDER — METOPROLOL TARTRATE 25 MG PO TABS
12.5000 mg | ORAL_TABLET | Freq: Two times a day (BID) | ORAL | Status: DC
  Administered 2016-05-17 – 2016-05-20 (×6): 12.5 mg via ORAL
  Filled 2016-05-17 (×7): qty 1

## 2016-05-17 MED ORDER — FAMOTIDINE 20 MG PO TABS
20.0000 mg | ORAL_TABLET | Freq: Every day | ORAL | Status: DC
  Administered 2016-05-17 – 2016-05-20 (×4): 20 mg via ORAL
  Filled 2016-05-17 (×4): qty 1

## 2016-05-17 MED ORDER — LEVOTHYROXINE SODIUM 25 MCG PO TABS
137.0000 ug | ORAL_TABLET | Freq: Every day | ORAL | Status: DC
  Administered 2016-05-18 – 2016-05-20 (×3): 137 ug via ORAL
  Filled 2016-05-17 (×3): qty 1

## 2016-05-17 MED ORDER — ACETAMINOPHEN 325 MG PO TABS
650.0000 mg | ORAL_TABLET | ORAL | Status: DC | PRN
  Administered 2016-05-17: 650 mg via ORAL
  Filled 2016-05-17: qty 2

## 2016-05-17 MED ORDER — DOCUSATE SODIUM 50 MG PO CAPS
50.0000 mg | ORAL_CAPSULE | Freq: Every day | ORAL | Status: DC
  Filled 2016-05-17: qty 1

## 2016-05-17 MED ORDER — FUROSEMIDE 10 MG/ML IJ SOLN
40.0000 mg | Freq: Once | INTRAMUSCULAR | Status: AC
  Administered 2016-05-17: 40 mg via INTRAVENOUS
  Filled 2016-05-17: qty 4

## 2016-05-17 MED ORDER — DOCUSATE SODIUM 100 MG PO CAPS
100.0000 mg | ORAL_CAPSULE | Freq: Every day | ORAL | Status: DC
  Administered 2016-05-17 – 2016-05-19 (×3): 100 mg via ORAL
  Filled 2016-05-17 (×3): qty 1

## 2016-05-17 NOTE — Progress Notes (Signed)
PULMONARY / CRITICAL CARE MEDICINE   Name: Melina ModenaSarah F Brinley MRN: [id number] DOB: Apr 29, 1941    ADMISSION DATE:  05/13/2016   Discussion: 75 yo Female with Hx of COPD, Pulmonary HTN, DM,HTN and CHF presented with acute on chronic  hypercapnic/hypoxic respiratory failure secondary to  Pneumonia/AECOPD.  Patient is intubated and mechanically ventilated.   ASSESSMENT / PLAN:  PULMONARY A: Acute on chronic hypercapnic hypoxic respiratory failure secondary to AECOPD vs. Pulmonary Edema. Hx: Pulmonary Hypertension and Chronic Diastolic CHF PRVC/18/500/5/35% CXR image reviewed 11/26; Increased LLL atelectasis vs. Effusion.    Weaning trial today.  IV steroids-- decrease.  IV lasix as blood pressure tolerates Continue bronchodilators Routine ABG  Routine CXR  CARDIOVASCULAR A: Hypotension, improved.  Pulmonary edema, with elevated BNP.  P:  Maintain MAP>60 Gentle diuresis.  Hold outpatient metoprolol Continue aspirin Continuous telemetry monitoring  RENAL A:   Acute renal failure likely secondary to hypotension P:   Trend BMP's Replace electrolytes as indicated Monitor closely I/O Renal function improving  GASTROINTESTINAL A:   No acute issues P:   Pepcid for PUD prophylaxis  HEMATOLOGIC A:   No acute issues  P:  Subq heparin for VTE prophylaxis Monitor for s/sx of bleeding Trend CBC's Transfuse for hgb <7  INFECTIOUS A:   Mild leukocytosis P:   Trend WBC's and monitor fever curve Trend PCT's Continue abx as listed above Follow cultures  CULTURES: Blood 11/22>>pending Sputum 11/22>> pending.   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   ANTIBIOTICS: Azithromycin 11/22>> Ceftriaxone 11/22>>x1 dose Vancomycin 11/25>>  ENDOCRINE A:   Type II DM Hx: Hypothyroidism  P:   SSI  Hyper/hypoglycemic protocol  NEUROLOGIC A:   Hx: Depression P:   RASS goal: 0 to -1 Fentanyl gtt, prn fentanyl and versed to maintain RASS  goal   LINES/TUBES: None  SIGNIFICANT EVENTS: 11/22-Pt admitted to ICU due to acute on chronic hypercapnic hypoxic respiratory failure secondary to AECOPD vs. Pulmonary Edema requiring mechanical ventilation   STUDIES:  05/13/16 ECHO>>The estimated ejection fraction was in the range of 55% to 65%.  --------------------------------------------------------  SUBJECTIVE:  Patient remains  Lightly sedated and on mechanical ventilation. Has diuresed well.. Afebrile overnight. Had an uneventful night.  VITAL SIGNS: BP 117/68 (BP Location: Right Arm)   Pulse 86   Temp 98.6 F (37 C) (Axillary)   Resp 18   Ht 5\' 7"  (1.702 m)   Wt 112.3 kg (247 lb 9.2 oz)   SpO2 95%   BMI 38.78 kg/m   HEMODYNAMICS:    VENTILATOR SETTINGS: Vent Mode: PSV FiO2 (%):  [35 %-50 %] 35 % Set Rate:  [18 bmp] 18 bmp Vt Set:  [500 mL] 500 mL PEEP:  [5 cmH20] 5 cmH20 Pressure Support:  [5 cmH20] 5 cmH20  INTAKE / OUTPUT: I/O last 3 completed shifts: In: 3137.5 [I.V.:332.3; Other:60; NG/GT:2345.2; IV Piggyback:400] Out: 5780 [Urine:5780]  PHYSICAL EXAMINATION: General:  Acutely ill appearing obese female Neuro:  Sedated nods appropriately, PERRL HEENT:  Supple, no JVD Cardiovascular:  S1S2, RRR, no M/R/G, 3+ pitting bilateral lower extremity edema Lungs:  Scattered Rhonchi, no wheezes, noted Abdomen:  Obese, hypoactive BS x4, soft Musculoskeletal:  Normal tone  Skin: scattered ecchymosis no rashes or lesions   LABS:  BMET  Recent Labs Lab 05/15/16 0530 05/16/16 0455 05/17/16 0637  NA 140 140 140  K 3.8 3.9 4.1  CL 101 101 95*  CO2 30 32 39*  BUN 34* 28* 28*  CREATININE 1.21* 0.89 0.93  GLUCOSE 189* 255* 245*  Electrolytes  Recent Labs Lab 05/14/16 0350 05/15/16 0530 05/16/16 0455 05/17/16 0637  CALCIUM 7.5* 7.8* 7.7* 8.3*  MG 1.7  --  1.7 1.7  PHOS 3.5  --  2.3* 2.2*    CBC  Recent Labs Lab 05/15/16 0530 05/16/16 0455 05/17/16 0637  WBC 12.5* 8.2 9.8  HGB  12.7 13.1 14.6  HCT 40.0 41.0 45.9  PLT 203 196 205    Coag's No results for input(s): APTT, INR in the last 168 hours.  Sepsis Markers  Recent Labs Lab 05/13/16 0916 05/14/16 0350 05/15/16 0530  PROCALCITON 0.12 0.27 0.14    ABG  Recent Labs Lab 05/13/16 1407 05/14/16 0418 05/15/16 0511  PHART 7.24* 7.29* 7.38  PCO2ART 62* 63* 56*  PO2ART 61* 65* 50*    Liver Enzymes  Recent Labs Lab 05/13/16 0916  AST 33  ALT 34  ALKPHOS 83  BILITOT 0.9  ALBUMIN 3.8    Cardiac Enzymes  Recent Labs Lab 05/13/16 1512 05/13/16 2146 05/14/16 0350  TROPONINI <0.03 <0.03 <0.03    Glucose  Recent Labs Lab 05/16/16 1133 05/16/16 1558 05/16/16 1909 05/16/16 2359 05/17/16 0400 05/17/16 0730  GLUCAP 288* 287* 265* 198* 265* 263*    Imaging Dg Chest 1 View  Result Date: 05/17/2016 CLINICAL DATA:  Dyspnea EXAM: CHEST 1 VIEW COMPARISON:  05/16/2016 FINDINGS: Cardiac shadow is mildly enlarged but stable. Endotracheal tube and nasogastric catheter are seen in satisfactory position. The right lung is clear. Left retrocardiac consolidation is noted with pleural effusion as well. The overall appearance is stable from the prior exam. IMPRESSION: No change in left basilar infiltrate and effusion. Electronically Signed   By: Alcide CleverMark  Lukens M.D.   On: 05/17/2016 08:06   [place] Venous Img Lower Unilateral Left  Result Date: 05/16/2016 CLINICAL DATA:  75 year old female with a history of edema EXAM: LEFT LOWER EXTREMITY VENOUS DOPPLER ULTRASOUND TECHNIQUE: Gray-scale sonography with graded compression, as well as color Doppler and duplex ultrasound were performed to evaluate the lower extremity deep venous systems from the level of the common femoral vein and including the common femoral, femoral, profunda femoral, popliteal and calf veins including the posterior tibial, peroneal and gastrocnemius veins when visible. The superficial great saphenous vein was also interrogated. Spectral  Doppler was utilized to evaluate flow at rest and with distal augmentation maneuvers in the common femoral, femoral and popliteal veins. COMPARISON:  None. FINDINGS: Contralateral Common Femoral Vein: Respiratory phasicity is normal and symmetric with the symptomatic side. No evidence of thrombus. Normal compressibility. Common Femoral Vein: No evidence of thrombus. Normal compressibility, respiratory phasicity and response to augmentation. Saphenofemoral Junction: No evidence of thrombus. Normal compressibility and flow on color Doppler imaging. Profunda Femoral Vein: No evidence of thrombus. Normal compressibility and flow on color Doppler imaging. Femoral Vein: No evidence of thrombus. Normal compressibility, respiratory phasicity and response to augmentation. Popliteal Vein: No evidence of thrombus. Normal compressibility, respiratory phasicity and response to augmentation. Calf Veins: No evidence of thrombus. Normal compressibility and flow on color Doppler imaging. Superficial Great Saphenous Vein: No evidence of thrombus. Normal compressibility and flow on color Doppler imaging. Other Findings: Anechoic fluid structure in the left popliteal region measures 6.0 cm x 1.8 cm x 4.7 cm, most compatible with Baker's cyst. IMPRESSION: Sonographic survey of the left lower extremity negative for DVT. Left-sided Baker's cyst. Signed, Yvone NeuJaime S. Loreta AveWagner, DO [facility] Electronically Signed   By: Gilmer MorJaime  Wagner D.O.   On: 05/16/2016 12:15  Wells Guiles, M.D.  05/17/2016  Critical Care Attestation.  I have personally obtained a history, examined the patient, evaluated laboratory and imaging results, formulated the assessment and plan and placed orders. The Patient requires high complexity decision making for assessment and support, frequent evaluation and titration of therapies, application of advanced monitoring technologies and extensive  interpretation of multiple databases. The patient has critical illness that could lead imminently to failure of 1 or more organ systems and requires the highest level of physician preparedness to intervene.  Critical Care Time devoted to patient care services described in this note is 35 minutes and is exclusive of time spent in procedures.

## 2016-05-17 NOTE — Progress Notes (Signed)
Patient is placed on high fowlers position, cuff deflated, suctioned orallya nd endotracheally and then extubated to Bipap 14/7 with 35% fio2

## 2016-05-17 NOTE — Progress Notes (Signed)
eLink Physician-Brief Progress Note Patient Name: Sheila ModenaSarah F Ryall DOB: 07-31-1940 MRN: [id number]   Date of Service  05/17/2016  HPI/Events of Note  headache  eICU Interventions  lft wnl tylena     Intervention Category Minor Interventions: Routine modifications to care plan (e.g. PRN medications for pain, fever)  Nelda BucksFEINSTEIN,Jenai Scaletta J. 05/17/2016, 11:21 PM

## 2016-05-17 NOTE — Progress Notes (Signed)
Wake up assessment not performed. Patient left on lower dose of Fentanyl during spontaneous breathing trial to help with comfort. Patient extubated, fentanyl turned off. Will continue to monitor patient.

## 2016-05-18 LAB — BASIC METABOLIC PANEL
Anion gap: 5 (ref 5–15)
BUN: 25 mg/dL — AB (ref 6–20)
CO2: 43 mmol/L — ABNORMAL HIGH (ref 22–32)
CREATININE: 0.75 mg/dL (ref 0.44–1.00)
Calcium: 8.5 mg/dL — ABNORMAL LOW (ref 8.9–10.3)
Chloride: 88 mmol/L — ABNORMAL LOW (ref 101–111)
GFR calc Af Amer: >60 mL/min (ref >60)
Glucose, Bld: 170 mg/dL — ABNORMAL HIGH (ref 65–99)
Potassium: 4.7 mmol/L (ref 3.5–5.1)
SODIUM: 136 mmol/L (ref 135–145)

## 2016-05-18 LAB — CBC
HCT: 46.8 % (ref 35.0–47.0)
Hemoglobin: 14.8 g/dL (ref 12.0–16.0)
MCH: 28.5 pg (ref 26.0–34.0)
MCHC: 31.6 g/dL — AB (ref 32.0–36.0)
MCV: 90 fL (ref 80.0–100.0)
PLATELETS: 205 10*3/uL (ref 150–440)
RBC: 5.2 MIL/uL (ref 3.80–5.20)
RDW: 14.6 % — AB (ref 11.5–14.5)
WBC: 9.5 10*3/uL (ref 3.6–11.0)

## 2016-05-18 LAB — GLUCOSE, CAPILLARY
GLUCOSE-CAPILLARY: 160 mg/dL — AB (ref 65–99)
GLUCOSE-CAPILLARY: 120 mg/dL — AB (ref 65–99)
Glucose-Capillary: 147 mg/dL — ABNORMAL HIGH (ref 65–99)
Glucose-Capillary: 169 mg/dL — ABNORMAL HIGH (ref 65–99)
Glucose-Capillary: 155 mg/dL — ABNORMAL HIGH (ref 65–99)
Glucose-Capillary: 143 mg/dL — ABNORMAL HIGH (ref 65–99)

## 2016-05-18 LAB — BLOOD GAS, ARTERIAL
Acid-Base Excess: 17.3 mmol/L — ABNORMAL HIGH (ref 0.0–2.0)
BICARBONATE: 45.2 mmol/L — AB (ref 20.0–28.0)
FIO2: 0.36
O2 SAT: 99.7 %
PATIENT TEMPERATURE: 37
PO2 ART: 189 mmHg — AB (ref 83.0–108.0)
pCO2 arterial: 65 mmHg — ABNORMAL HIGH (ref 32.0–48.0)
pH, Arterial: 7.45 (ref 7.350–7.450)

## 2016-05-18 LAB — VANCOMYCIN, TROUGH: Vancomycin Tr: 15 ug/mL (ref 15–20)

## 2016-05-18 LAB — CULTURE, BLOOD (ROUTINE X 2): CULTURE: NO GROWTH

## 2016-05-18 MED ORDER — ENOXAPARIN SODIUM 40 MG/0.4ML ~~LOC~~ SOLN
40.0000 mg | Freq: Two times a day (BID) | SUBCUTANEOUS | Status: DC
  Administered 2016-05-18 – 2016-05-19 (×3): 40 mg via SUBCUTANEOUS
  Filled 2016-05-18 (×3): qty 0.4

## 2016-05-18 MED ORDER — INSULIN GLARGINE 100 UNIT/ML ~~LOC~~ SOLN
10.0000 [IU] | Freq: Every day | SUBCUTANEOUS | Status: DC
  Administered 2016-05-18 – 2016-05-20 (×3): 10 [IU] via SUBCUTANEOUS
  Filled 2016-05-18 (×4): qty 0.1

## 2016-05-18 NOTE — Progress Notes (Signed)
Dr. Belia HemanKasa called, Pt is with Acute COPD exacerbation require bipap. Now improving and stable for hospitalist service from tomorrow.

## 2016-05-18 NOTE — Progress Notes (Signed)
Nutrition Follow-up  DOCUMENTATION CODES:   Morbid obesity  INTERVENTION:  -Pt may benefit from downgrading diet to Dysphagia III or chopped meats only due to pt being edentulous; pt currently able to make selections at meal periods and can choose appropriate foods. Will continue current diet order as is at present   NUTRITION DIAGNOSIS:   Inadequate oral intake related to acute illness as evidenced by NPO status.  Improving as diet advanced post extubation, pt tolerating with good appetite  GOAL:   Patient will meet greater than or equal to 90% of their needs   MONITOR:   PO intake, Labs, Weight trends  REASON FOR ASSESSMENT:   Ventilator    ASSESSMENT:     75 yo female admitted with acute on chronic respiratory failure secondary to AECOPD vs pulmonary edema  Extubated 11/26; currently on 4L [place]. Diet advanced to Heart Healthy/Carb Modified. Pt with good appetite, eating meals well. Pt is edentulous and does not wear dentures  Labs: reviewed Meds: reviewed  Diet Order:  Diet heart healthy/carb modified Room service appropriate? Yes; Fluid consistency: Thin  Skin:  Reviewed, no issues  Last BM:  no documented BM  Height:   Ht Readings from Last 1 Encounters:  05/18/16 5\' 7"  (1.702 m)    Weight:   Wt Readings from Last 1 Encounters:  05/18/16 287 lb 4.2 oz (130.3 kg)    BMI:  Body mass index is 44.99 kg/m.  Estimated Nutritional Needs:   Kcal:  1700-2000 kcals  Protein:  90-110 g  Fluid:  >/= 1.7 L  EDUCATION NEEDS:   No education needs identified at this time  Romelle StarcherCate Raven Furnas MS, RD, LDN [phone] Pager  [phone] Weekend/On-Call Pager

## 2016-05-18 NOTE — Progress Notes (Signed)
75 y/o F with AECOPD on Lovenox 40 mg daily for DVT prophylaxis. Will increase Lovenox to 40 mg bid due to weight > 100 kg, BMI > 40, and CrCl > 30 ml/min.   Luisa HartScott Seairra Otani, PharmD Clinical Pharmacist

## 2016-05-18 NOTE — Progress Notes (Signed)
Pharmacy Antibiotic Note  Sheila ModenaSarah F Hayes is a 75 y.o. female admitted on 05/13/2016 with COPD exacerbation. Now being treated for PNA.  Pharmacy has been consulted for Vancomycin dosing.  Patient also received azithromycin 500 mg IV x 4 doses and ceftriaxone x 1 dose  Plan: Per rounds, continue vancomycin 1gm IV every 12 hours. 11/27 0745 VT = 15 - will monitor for change in renal function to determine when/if additional trough should be drawn.  Height: 5\' 7"  (170.2 cm) Weight: 287 lb 4.2 oz (130.3 kg) IBW/kg (Calculated) : 61.6  Temp (24hrs), Avg:98.3 F (36.8 C), Min:98.1 F (36.7 C), Max:98.5 F (36.9 C)   Recent Labs Lab 05/14/16 0350 05/15/16 0530 05/16/16 0455 05/17/16 0637 05/18/16 0745  WBC 7.2 12.5* 8.2 9.8 9.5  CREATININE 1.34* 1.21* 0.89 0.93 0.75  VANCOTROUGH  --   --   --   --  15    Estimated Creatinine Clearance: 86.8 mL/min (by C-G formula based on SCr of 0.75 mg/dL).    No Known Allergies  Antimicrobials this admission: 11/23 azithromycin >> 11/25 11/25 Vancomycin  >>  Dose adjustments this admission:  Microbiology results: 11/25 SpCx: no growth x 1 day 11/22 BCx: NG Final 11/22 MRSA PCR: positive  Thank you for allowing pharmacy to be a part of this patient's care.  Horris LatinoHolly Gilliam, PharmD Pharmacy Resident 05/18/2016 3:13 PM

## 2016-05-18 NOTE — Progress Notes (Signed)
Palliative Medicine consult noted. Due to high referral volume, there may be a delay seeing this patient. Please call the Palliative Medicine Team office at [phone] if recommendations are needed in the interim.  Thank you for inviting us to see this patient.  Margret ChanceMelanie G. Artemisia Auvil, RN, BSN, [facility] 05/18/2016 3:11 PM Cell [phone] 8:00-4:00 Monday-Friday Office [phone]

## 2016-05-18 NOTE — Progress Notes (Signed)
Inpatient Diabetes Program Recommendations  AACE/ADA: New Consensus Statement on Inpatient Glycemic Control (2015)  Target Ranges:  Prepandial:   less than 140 mg/dL      Peak postprandial:   less than 180 mg/dL (1-2 hours)      Critically ill patients:  140 - 180 mg/dL   Results for Sheila Hayes, Sheila Hayes (MRN [id number]) as of 05/18/2016 08:24  Ref. Range 05/16/2016 23:59 05/17/2016 04:00 05/17/2016 07:30 05/17/2016 11:28 05/17/2016 15:56 05/17/2016 19:51  Glucose-Capillary Latest Ref Range: 65 - 99 mg/dL 132198 (H) 440265 (H) 102263 (H) 155 (H) 256 (H) 252 (H)   Results for Sheila Hayes, Sheila Hayes (MRN [id number]) as of 05/18/2016 08:24  Ref. Range 05/17/2016 23:57 05/18/2016 04:10 05/18/2016 07:36  Glucose-Capillary Latest Ref Range: 65 - 99 mg/dL 347172 (H) 425147 (H) 956169 (H)    Admit with: SOB  History: DM, CHF  Home DM Meds: Metformin 500 mg BID  Current Insulin Orders: Novolog Resistant Correction Scale/ SSI (0-20 units) Q4 hours      Novolog 3 units Q4 hours (tube feed coverage)      MD- Note patient Extubated yesterday and Tube feeds discontinued.  Please consider the following in-hospital insulin adjustments:  1. Discontinue Novolog 3 units Q4 hours (tube feed coverage)  2. Change Novolog Resistant Correction Scale/ SSI (0-20 units) to TID AC + HS (currently ordered Q4 hours)      --Will follow patient during hospitalization--  Ambrose FinlandJeannine Johnston Merton Wadlow RN, MSN, CDE Diabetes Coordinator Inpatient Glycemic Control Team Team Pager: [phone] (8a-5p)

## 2016-05-18 NOTE — Care Management (Addendum)
O2 is ACUTE. Spoke with patient's niece Sheila Hayes [phone]. She is supposed to use a cane or walker but doesn't. Dr. Greggory StallionGeorge with [facility] prescribes medications but "she doesn't take them- including insulin". Patient "fibs to Dr. Greggory StallionGeorge". She does not feel that patient has dementia- she forgets but no dementia. Patient "don't even bathe". Sheila Hayes is patient's niece and stays with patient. She won't even take a bath.  Patient also has a shower chair. She has medicaid and medicare so her meds are covered. Patient is able to drive. 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 RNCM will follow- patient may benefit [facility].

## 2016-05-19 LAB — CULTURE, RESPIRATORY W GRAM STAIN: Culture: NO GROWTH

## 2016-05-19 LAB — GLUCOSE, CAPILLARY
GLUCOSE-CAPILLARY: 128 mg/dL — AB (ref 65–99)
GLUCOSE-CAPILLARY: 137 mg/dL — AB (ref 65–99)
GLUCOSE-CAPILLARY: 136 mg/dL — AB (ref 65–99)
GLUCOSE-CAPILLARY: 212 mg/dL — AB (ref 65–99)
GLUCOSE-CAPILLARY: 144 mg/dL — AB (ref 65–99)
Glucose-Capillary: 134 mg/dL — ABNORMAL HIGH (ref 65–99)

## 2016-05-19 MED ORDER — BUDESONIDE 0.5 MG/2ML IN SUSP
0.5000 mg | Freq: Two times a day (BID) | RESPIRATORY_TRACT | Status: DC
  Administered 2016-05-19 – 2016-05-20 (×2): 0.5 mg via RESPIRATORY_TRACT
  Filled 2016-05-19 (×2): qty 2

## 2016-05-19 MED ORDER — LEVOFLOXACIN 750 MG PO TABS
750.0000 mg | ORAL_TABLET | Freq: Every day | ORAL | Status: DC
  Administered 2016-05-19 – 2016-05-20 (×2): 750 mg via ORAL
  Filled 2016-05-19 (×2): qty 1

## 2016-05-19 MED ORDER — MOMETASONE FURO-FORMOTEROL FUM 100-5 MCG/ACT IN AERO
2.0000 | INHALATION_SPRAY | Freq: Two times a day (BID) | RESPIRATORY_TRACT | Status: DC
  Administered 2016-05-19 – 2016-05-20 (×3): 2 via RESPIRATORY_TRACT
  Filled 2016-05-19: qty 8.8

## 2016-05-19 MED ORDER — SENNA 8.6 MG PO TABS
1.0000 | ORAL_TABLET | Freq: Two times a day (BID) | ORAL | Status: DC
  Administered 2016-05-19 – 2016-05-20 (×2): 8.6 mg via ORAL
  Filled 2016-05-19 (×2): qty 1

## 2016-05-19 MED ORDER — METHYLPREDNISOLONE SODIUM SUCC 40 MG IJ SOLR
40.0000 mg | Freq: Every day | INTRAMUSCULAR | Status: DC
  Administered 2016-05-20: 08:00:00 40 mg via INTRAVENOUS
  Filled 2016-05-19: qty 1

## 2016-05-19 MED ORDER — TIOTROPIUM BROMIDE MONOHYDRATE 18 MCG IN CAPS
18.0000 ug | ORAL_CAPSULE | Freq: Every day | RESPIRATORY_TRACT | Status: DC
  Administered 2016-05-19 – 2016-05-20 (×2): 18 ug via RESPIRATORY_TRACT
  Filled 2016-05-19: qty 5

## 2016-05-19 MED ORDER — SODIUM CHLORIDE 0.9 % IV SOLN
Freq: Once | INTRAVENOUS | Status: AC
  Administered 2016-05-19: 17:00:00 via INTRAVENOUS

## 2016-05-19 MED ORDER — DOCUSATE SODIUM 100 MG PO CAPS
100.0000 mg | ORAL_CAPSULE | Freq: Two times a day (BID) | ORAL | Status: DC
  Administered 2016-05-19 – 2016-05-20 (×2): 100 mg via ORAL
  Filled 2016-05-19 (×2): qty 1

## 2016-05-19 MED ORDER — ENOXAPARIN SODIUM 40 MG/0.4ML ~~LOC~~ SOLN
40.0000 mg | SUBCUTANEOUS | Status: DC
  Administered 2016-05-20: 40 mg via SUBCUTANEOUS
  Filled 2016-05-19: qty 0.4

## 2016-05-19 NOTE — Progress Notes (Signed)
[facility] [place] [facility]   PATIENT NAME: Sheila MediateSarah Casalino    MR#:  [id number]  DATE OF BIRTH:  1940-10-10  SUBJECTIVE:    Patient reports less short of 7 breath and some mild wheezing   REVIEW OF SYSTEMS:    Review of Systems  Constitutional: Positive for malaise/fatigue. Negative for chills and fever.  HENT: Negative.  Negative for ear discharge, ear pain, hearing loss, nosebleeds and sore throat.   Eyes: Negative.  Negative for blurred vision and pain.  Respiratory: Positive for cough and shortness of breath. Negative for hemoptysis and wheezing.   Cardiovascular: Negative.  Negative for chest pain, palpitations and leg swelling.  Gastrointestinal: Negative.  Negative for abdominal pain, blood in stool, diarrhea, nausea and vomiting.  Genitourinary: Negative.  Negative for dysuria.  Musculoskeletal: Negative.  Negative for back pain.  Skin: Negative.   Neurological: Negative for dizziness, tremors, speech change, focal weakness, seizures and headaches.  Endo/Heme/Allergies: Negative.  Does not bruise/bleed easily.  Psychiatric/Behavioral: Negative.  Negative for depression, hallucinations and suicidal ideas.    Tolerating Diet: yes      DRUG ALLERGIES:  No Known Allergies  VITALS:  Blood pressure 99/62, pulse 61, temperature 98.5 F (36.9 C), temperature source Oral, resp. rate 20, height 5\' 7"  (1.702 m), weight 130.3 kg (287 lb 4.2 oz), SpO2 97 %.  PHYSICAL EXAMINATION:   Physical Exam  Constitutional: She is oriented to person, place, and time and well-developed, well-nourished, and in no distress. No distress.  HENT:  Head: Normocephalic.  Eyes: No scleral icterus.  Neck: Normal range of motion. Neck supple. No JVD present. No tracheal deviation present.  Cardiovascular: Normal rate, regular rhythm and normal heart sounds.  Exam reveals no gallop and no friction rub.   No murmur heard. Pulmonary/Chest: Effort normal. No respiratory  distress. She has wheezes. She has no rales. She exhibits no tenderness.  Abdominal: Soft. Bowel sounds are normal. She exhibits no distension and no mass. There is no tenderness. There is no rebound and no guarding.  Musculoskeletal: Normal range of motion. She exhibits no edema.  Neurological: She is alert and oriented to person, place, and time.  Skin: Skin is warm. No rash noted. No erythema.  Psychiatric: Affect and judgment normal.      LABORATORY PANEL:   CBC  Recent Labs Lab 05/18/16 0745  WBC 9.5  HGB 14.8  HCT 46.8  PLT 205   ------------------------------------------------------------------------------------------------------------------  Chemistries   Recent Labs Lab 05/13/16 0916  05/17/16 0637 05/18/16 0745  NA 137  < > 140 136  K 5.0  < > 4.1 4.7  CL 98*  < > 95* 88*  CO2 31  < > 39* 43*  GLUCOSE 215*  < > 245* 170*  BUN 43*  < > 28* 25*  CREATININE 1.40*  < > 0.93 0.75  CALCIUM 8.8*  < > 8.3* 8.5*  MG  --   < > 1.7  --   AST 33  --   --   --   ALT 34  --   --   --   ALKPHOS 83  --   --   --   BILITOT 0.9  --   --   --   < > = values in this interval not displayed. ------------------------------------------------------------------------------------------------------------------  Cardiac Enzymes  Recent Labs Lab 05/13/16 1512 05/13/16 2146 05/14/16 0350  TROPONINI <0.03 <0.03 <0.03   ------------------------------------------------------------------------------------------------------------------  RADIOLOGY:  No results found.   ASSESSMENT  AND PLAN:   75 year old female with a history of COPD not on oxygen and pulmonary hypertension who presented to the ICU with acute on chronic hypercapnic/hypoxic respiratory failure due to COPD exacerbation.  1. Acute on chronic hypercapnic/hypoxic respiratory failure in the setting of COPD exacerbation/CAP and Acute on chronic diastolic heart failure: Patient was initially intubated and extubated.  She is transitioned to nasal cannula well. Continue to taper steroids Continue inhalers and nebulizer treatments Continue vancomycin,add Levaquin Continue Lasix  2. Acute on chronic diastolic heart failure with preserved ejection fraction/pulmonary hypertension: Continue IV Lasix BMP for a.m. Consider transitioning to oral tomorrow  3. CAP: MRSA PCR is positive  Continue vancomycin and add Levaquin,  4. Diabetes: Continue sliding scale and send  5. Hypothyroid: Continue Synthroid  6. Hypertension on metoprolol    Management plans discussed with the patient and she is in agreement.  CODE STATUS: full  TOTAL TIME TAKING CARE OF THIS PATIENT: 33 minutes.     POSSIBLE D/C 1-2 days, DEPENDING ON CLINICAL CONDITION.   Blue Ruggerio M.D on 05/19/2016 at 12:10 PM  Between 7am to 6pm - Pager - [phone] After 6pm go to [email or web address] - password [email or web address]  [facility]  Office  [phone]  CC: Primary care physician; Rayetta HumphreyGeorge, Sionne A, MD  Note: This dictation was prepared with Dragon dictation along with smaller phrase technology. Any transcriptional errors that result from this process are unintentional.

## 2016-05-19 NOTE — Progress Notes (Signed)
Pharmacy Antibiotic Note  Sheila Hayes is a 75 y.o. female admitted on 05/13/2016 with COPD exacerbation. Now being treated for PNA.  Pharmacy has been consulted for Vancomycin and levofloxacin dosing.  Patient also received azithromycin 500 mg IV x 4 doses and ceftriaxone x 1 dose  Plan: Per rounds, continue vancomycin 1gm IV every 12 hours for 5 days total. Stop date entered for 11/29 11/27 0745 VT = 15   Patient will also be initiated on levofloxacin 750 mg PO q 24 hours today  Height: 5\' 7"  (170.2 cm) Weight: 287 lb 4.2 oz (130.3 kg) IBW/kg (Calculated) : 61.6  Temp (24hrs), Avg:98.6 F (37 C), Min:98.4 F (36.9 C), Max:98.8 F (37.1 C)   Recent Labs Lab 05/14/16 0350 05/15/16 0530 05/16/16 0455 05/17/16 0637 05/18/16 0745  WBC 7.2 12.5* 8.2 9.8 9.5  CREATININE 1.34* 1.21* 0.89 0.93 0.75  VANCOTROUGH  --   --   --   --  15    Estimated Creatinine Clearance: 86.8 mL/min (by C-G formula based on SCr of 0.75 mg/dL).    No Known Allergies  Antimicrobials this admission: 11/23 azithromycin >> 11/25 11/25 Vancomycin  >> 11/29 11/28 Levofloxacin >>   Dose adjustments this admission:   Microbiology results: 11/25 SpCx: no growth x 2 days 11/22 BCx: NG Final 11/22 MRSA PCR: positive  Thank you for allowing pharmacy to be a part of this patient's care.  Horris LatinoHolly Auther Lyerly, PharmD Pharmacy Resident 05/19/2016 1:28 PM

## 2016-05-19 NOTE — Progress Notes (Signed)
Report called to Selena BattenKim, RN on 1C.

## 2016-05-19 NOTE — Evaluation (Signed)
Physical Therapy Evaluation Patient Details Name: Sheila ModenaSarah F Hayes MRN: [id number] DOB: 1940-07-22 Today's Date: 05/19/2016   History of Present Illness  presented to ER from PCP secondary to worsening respiratory status, wheezing and LE edema, requiring intubation (11/22-11/25) upon arrival.  Admitted for acute/chronic hypercapnic hypoxic respiratory failure related to PNA/COPD exacerbation.  Clinical Impression  Upon evaluation, patient alert and oriented to basic information; notably fatigued with minimal effort.  Bilat UE/LEs generally weak and deconditioned due to acute illness, but functional for evaluation.  Currently requiring min assist for bed mobility, close sup for unsupported sitting.  Moderate reports of dizziness with transition to upright; noted with positive orthostatics (sitting BP 83/47, HR 75; supine BP 100/53, HR 68).  Unsafe/unable to further progress mobility/OOB as result.  RN informed/aware. Minimal/no SOB with evaluation components; O2 >94% on 3L supplemental O2 throughout. Would benefit from skilled PT to address above deficits and promote optimal return to PLOF; recommend transition to STR upon discharge from acute hospitalization.     Follow Up Recommendations SNF    Equipment Recommendations  Rolling walker with 5" wheels    Recommendations for Other Services       Precautions / Restrictions Precautions Precautions: Fall Precaution Comments: contact isolation Restrictions Weight Bearing Restrictions: No      Mobility  Bed Mobility Overal bed mobility: Needs Assistance Bed Mobility: Supine to Sit;Sit to Supine     Supine to sit: Min guard;Min assist Sit to supine: Supervision   General bed mobility comments: heavy use of bedrails to assist with truncal elevation  Transfers                 General transfer comment: unsafe/unable due to symptomatic orthostasis  Ambulation/Gait             General Gait Details: unsafe/unable due  to symptomatic orthostasis  Stairs            Wheelchair Mobility    Modified Rankin (Stroke Patients Only)       Balance Overall balance assessment: Needs assistance Sitting-balance support: No upper extremity supported;Feet supported Sitting balance-[name] Scale: Good         Standing balance comment: unsafe/unable due to symptomatic orthostasis                             Pertinent Vitals/Pain Pain Assessment: No/denies pain    Home Living Family/patient expects to be discharged to:: Private residence Living Arrangements: Alone   Type of Home: Apartment       Home Layout: One level Home Equipment: Walker - 4 wheels;Cane - single point      Prior Function Level of Independence: Independent with assistive device(s)         Comments: Mod indep with 4WRW for ADLs, household and limited community mobility; + driving; denies fall history.     Hand Dominance        Extremity/Trunk Assessment   Upper Extremity Assessment: Generalized weakness (grossly 3+/5 throughout)           Lower Extremity Assessment: Generalized weakness (grossly 3+/5 throughout)         Communication   Communication: No difficulties  Cognition Arousal/Alertness: Awake/alert Behavior During Therapy: Flat affect;WFL for tasks assessed/performed Overall Cognitive Status: Within Functional Limits for tasks assessed                      General Comments      Exercises Other  Exercises Other Exercises: Rolling bilat for bedpan use, sup; heavy use of bedrails.  Unable to fully clear buttocks with bridging; dep for peri-care   Assessment/Plan    PT Assessment Patient needs continued PT services  PT Problem List Decreased strength;Decreased range of motion;Decreased activity tolerance;Decreased balance;Decreased mobility;Decreased coordination;Decreased knowledge of use of DME;Decreased safety awareness;Decreased knowledge of precautions;Cardiopulmonary  status limiting activity;Obesity          PT Treatment Interventions DME instruction;Gait training;Stair training;Functional mobility training;Therapeutic activities;Therapeutic exercise;Balance training;Patient/family education    PT Goals (Current goals can be found in the Care Plan section)  Acute Rehab PT Goals Patient Stated Goal: to try to get up PT Goal Formulation: With patient Time For Goal Achievement: 06/02/16 Potential to Achieve Goals: Good Additional Goals Additional Goal #1: Assess and establish goals for OOB activities as appropriate.    Frequency Min 2X/week   Barriers to discharge Decreased caregiver support      Co-evaluation               End of Session Equipment Utilized During Treatment: Oxygen Activity Tolerance: Treatment limited secondary to medical complications (Comment) (symptomatic orthostasis) Patient left: in bed;with call bell/phone within reach;with bed alarm set Nurse Communication:  (response to upright)         Time: [id number] PT Time Calculation (min) (ACUTE ONLY): 22 min   Charges:   PT Evaluation $PT Eval Moderate Complexity: 1 Procedure PT Treatments $Therapeutic Activity: 8-22 mins   PT G Codes:        Ludie Pavlik H. Manson PasseyBrown, PT, DPT, NCS 05/19/16, 4:01 PM [phone]

## 2016-05-19 NOTE — Progress Notes (Signed)
Pt is alert, oriented to self and time but remains confused as to why she is here. Pt has been reoriented multiple times.  She remains normal sinus with a bundle branch block on the cardiac monitor.  Pt was able to sleep well last night.  Pt wore bipap, 35%, while she slept.  Now on nasal canula at 3L, sats are in the 90s.  Pt has had good urine output.  Pt has remained afebrile.

## 2016-05-19 NOTE — Progress Notes (Signed)
Family Meeting Note  Advance Directive:no  Today a meeting took place with the Patient.   The following clinical team members were present during this meeting:MD  The following were discussed:Patient's diagnosis: COPD exacerbation PNA   Patient's progosis: Unable to determine and Goals for treatment: Full Code  Additional follow-up to be provided: consider advance directives and outpatient palliative care consult  Time spent during discussion:16 minutes  Curlie Macken, MD

## 2016-05-19 NOTE — Progress Notes (Signed)
Anticoagulation monitoring(Lovenox):  75yo  F ordered Lovenox 40 mg Q12h  Filed Weights   05/18/16 0505 05/19/16 0415 05/19/16 1300  Weight: 287 lb 4.2 oz (130.3 kg) 287 lb 4.2 oz (130.3 kg) 230 lb (104.3 kg)   BMI 36.1   Lab Results  Component Value Date   CREATININE 0.75 05/18/2016   CREATININE 0.93 05/17/2016   CREATININE 0.89 05/16/2016   Estimated Creatinine Clearance: 76.7 mL/min (by C-G formula based on SCr of 0.75 mg/dL). Hemoglobin & Hematocrit     Component Value Date/Time   HGB 14.8 05/18/2016 0745   HGB 9.8 (L) 02/06/2014 0508   HCT 46.8 05/18/2016 0745   HCT 30.0 (L) 02/06/2014 [id number]     Per Protocol for Patient with estCrcl > 30 ml/min and BMI < 40, will transition to Lovenox 40 mg Q24h      Bari MantisKristin Dawson Albers PharmD Clinical Pharmacist 05/19/2016

## 2016-05-19 NOTE — Progress Notes (Signed)
NS bolus of 500 ml should have been entered as 250 ml. 250 ml of NS 0.9% was administered.

## 2016-05-19 NOTE — Progress Notes (Signed)
Patient moved to room 126 by bed with Chasity, NT.  Patient on o2 at 4L for transport.  Alert with no distress noted when leaving ICU.

## 2016-05-19 NOTE — Progress Notes (Signed)
PULMONARY / CRITICAL CARE MEDICINE   Name: Melina ModenaSarah F Bowdoin MRN: [id number] DOB: 1940-07-14    ADMISSION DATE:  05/13/2016   Discussion: 75 yo Female with Hx of COPD, Pulmonary HTN, DM,HTN and CHF presented with acute on chronic  hypercapnic/hypoxic respiratory failure secondary to  Pneumonia/AECOPD.  Extubated, resp status improved   ASSESSMENT / PLAN:  PULMONARY A: Acute on chronic hypercapnic hypoxic respiratory failure secondary to AECOPD vs. Pulmonary Edema. Hx: Pulmonary Hypertension and Chronic Diastolic CHF PRVC/18/500/5/35% CXR image reviewed 11/26; Increased LLL atelectasis vs. Effusion.    IV steroids-- decrease.  IV lasix as blood pressure tolerates Continue bronchodilators Routine ABG  Routine CXR  CARDIOVASCULAR A: Hypotension, improved.  Pulmonary edema, with elevated BNP.  P:  Maintain MAP>60 Gentle diuresis.  Hold outpatient metoprolol Continue aspirin Continuous telemetry monitoring  RENAL A:   Acute renal failure likely secondary to hypotension P:   Trend BMP's Replace electrolytes as indicated Monitor closely I/O Renal function improving  GASTROINTESTINAL A:   No acute issues P:   Pepcid for PUD prophylaxis  HEMATOLOGIC A:   No acute issues  P:  Subq heparin for VTE prophylaxis Monitor for s/sx of bleeding Trend CBC's Transfuse for hgb <7  INFECTIOUS A:   Mild leukocytosis P:   Trend WBC's and monitor fever curve Trend PCT's Continue abx as listed above Follow cultures  CULTURES: Blood 11/22>>pending Sputum 11/22>> pending.   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   ANTIBIOTICS: Azithromycin 11/22>> Ceftriaxone 11/22>>x1 dose Vancomycin 11/25>>  ENDOCRINE A:   Type II DM Hx: Hypothyroidism  P:   SSI  Hyper/hypoglycemic protocol  NEUROLOGIC A:   Hx: Depression P:   RASS goal: 0 to -1 Fentanyl gtt, prn fentanyl and versed to maintain RASS goal   LINES/TUBES: None  SIGNIFICANT EVENTS: 11/22-Pt admitted to ICU due  to acute on chronic hypercapnic hypoxic respiratory failure secondary to AECOPD vs. Pulmonary Edema requiring mechanical ventilation   STUDIES:  05/13/16 ECHO>>The estimated ejection fraction was in the range of 55% to 65%.  --------------------------------------------------------  SUBJECTIVE:  Alert and awake, follows commands resp status improved   VITAL SIGNS: BP 106/65   Pulse 78   Temp 98.8 F (37.1 C) (Axillary)   Resp 18   Ht 5\' 7"  (1.702 m)   Wt 287 lb 4.2 oz (130.3 kg)   SpO2 91%   BMI 44.99 kg/m   HEMODYNAMICS:    VENTILATOR SETTINGS: FiO2 (%):  [35 %-92 %] 35 %  INTAKE / OUTPUT: I/O last 3 completed shifts: In: 2040 [I.V.:1640; IV Piggyback:400] Out: 1450 [Urine:1450]  PHYSICAL EXAMINATION: General:   obese female Neuro:  PERRL HEENT:  Supple, no JVD Cardiovascular:  S1S2, RRR, no M/R/G, 3+ pitting bilateral lower extremity edema Lungs:  Scattered Rhonchi, no wheezes, noted Abdomen:  Obese, hypoactive BS x4, soft Musculoskeletal:  Normal tone  Skin: scattered ecchymosis no rashes or lesions   LABS:  BMET  Recent Labs Lab 05/16/16 0455 05/17/16 0637 05/18/16 0745  NA 140 140 136  K 3.9 4.1 4.7  CL 101 95* 88*  CO2 32 39* 43*  BUN 28* 28* 25*  CREATININE 0.89 0.93 0.75  GLUCOSE 255* 245* 170*    Electrolytes  Recent Labs Lab 05/14/16 0350  05/16/16 0455 05/17/16 0637 05/18/16 0745  CALCIUM 7.5*  < > 7.7* 8.3* 8.5*  MG 1.7  --  1.7 1.7  --   PHOS 3.5  --  2.3* 2.2*  --   < > = values in this interval  not displayed.  CBC  Recent Labs Lab 05/16/16 0455 05/17/16 0637 05/18/16 0745  WBC 8.2 9.8 9.5  HGB 13.1 14.6 14.8  HCT 41.0 45.9 46.8  PLT 196 205 205    Coag's No results for input(s): APTT, INR in the last 168 hours.  Sepsis Markers  Recent Labs Lab 05/13/16 0916 05/14/16 0350 05/15/16 0530  PROCALCITON 0.12 0.27 0.14    ABG  Recent Labs Lab 05/15/16 0511 05/17/16 0854 05/18/16 0518  PHART 7.38 7.37  7.45  PCO2ART 56* 82* 65*  PO2ART 50* 66* 189*    Liver Enzymes  Recent Labs Lab 05/13/16 0916  AST 33  ALT 34  ALKPHOS 83  BILITOT 0.9  ALBUMIN 3.8    Cardiac Enzymes  Recent Labs Lab 05/13/16 1512 05/13/16 2146 05/14/16 0350  TROPONINI <0.03 <0.03 <0.03    Glucose  Recent Labs Lab 05/18/16 1619 05/18/16 1959 05/18/16 2324 05/19/16 0323 05/19/16 0733 05/19/16 0917  GLUCAP 155* 143* 120* 128* 137* 136*   I have personally obtained a history, examined the patient, evaluated Pertinent laboratory and RadioGraphic/imaging results, and  formulated the assessment and plan   The Patient requires high complexity decision making for assessment and support, frequent evaluation and titration of therapies.  Ok to transfer to gen med floor  Eleonore Shippee Santiago Gladavid Yarisbel Miranda, M.D.  [facility]  Medical Director [facility] Medical Director [facility]

## 2016-05-20 LAB — GLUCOSE, CAPILLARY
GLUCOSE-CAPILLARY: 102 mg/dL — AB (ref 65–99)
GLUCOSE-CAPILLARY: 144 mg/dL — AB (ref 65–99)
GLUCOSE-CAPILLARY: 82 mg/dL (ref 65–99)
GLUCOSE-CAPILLARY: 91 mg/dL (ref 65–99)
Glucose-Capillary: 137 mg/dL — ABNORMAL HIGH (ref 65–99)

## 2016-05-20 MED ORDER — METOPROLOL TARTRATE 25 MG PO TABS: 12.5000 mg | ORAL_TABLET | Freq: Two times a day (BID) | ORAL | 0 refills | Status: DC

## 2016-05-20 MED ORDER — PREDNISONE 10 MG PO TABS: ORAL_TABLET | ORAL | 0 refills | Status: DC

## 2016-05-20 MED ORDER — GUAIFENESIN-DM 100-10 MG/5ML PO SYRP: 5.0000 mL | ORAL_SOLUTION | ORAL | 0 refills | Status: DC | PRN

## 2016-05-20 MED ORDER — LEVOFLOXACIN 750 MG PO TABS: 750.0000 mg | ORAL_TABLET | Freq: Every day | ORAL | 0 refills | Status: DC

## 2016-05-20 NOTE — Clinical Social Work Placement (Addendum)
   CLINICAL SOCIAL WORK PLACEMENT  NOTE  Date:  05/20/2016  Patient Details  Name: Sheila Hayes MRN: [id number] Date of Birth: Nov 15, 1940  Clinical Social Work is seeking post-discharge placement for this patient at the Skilled  Nursing Facility level of care (*CSW will initial, date and re-position this form in  chart as items are completed):  Yes   Patient/family provided with [place] Clinical Social Work Department's list of facilities offering this level of care within the geographic area requested by the patient (or if unable, by the patient's family).  Yes   Patient/family informed of their freedom to choose among providers that offer the needed level of care, that participate in Medicare, Medicaid or managed care program needed by the patient, have an available bed and are willing to accept the patient.  Yes   Patient/family informed of [place]'s ownership interest in [facility] and [facility], as well as of the fact that they are under no obligation to receive care at these facilities.  PASRR submitted to EDS on       PASRR number received on       Existing PASRR number confirmed on 05/20/16     FL2 transmitted to all facilities in geographic area requested by pt/family on 05/20/16     FL2 transmitted to all facilities within larger geographic area on       Patient informed that his/her managed care company has contracts with or will negotiate with certain facilities, including the following:            Patient/family informed of bed offers received.  05/20/2016   Patient chooses bed at     [facility]  Physician recommends and patient chooses bed at     SNF Patient to be transferred to   on  .  05/20/2016   Patient to be transferred to facility by      EMS  Patient family notified on   of transfer.  Yes niece  Name of family member notified:       [name]  PHYSICIAN Please sign FL2     Additional Comment:     _______________________________________________ Raye Sorrowoble, Bradleigh Sonnen N, LCSW 05/20/2016, 12:13 PM

## 2016-05-20 NOTE — Progress Notes (Signed)
EMS here to transport patient-no distress noted.

## 2016-05-20 NOTE — Progress Notes (Signed)
Patient is medically stable for discharge. She will be [facility]. Family: Sheila Hayes contacted and made aware of discharge/ bed choice.  Patient will go by EMS. NO other needs at this time.  Patient is going to room 604 . RN to call report to Konrad DoloresKim Hicks [phone].  LCSW prepared and completed all dc paperwork.  Will sign off.  Deretha EmoryHannah Dontavis Tschantz LCSW, MSW Clinical Social Work: Optician, dispensingystem Wide Float Coverage for :  [phone]

## 2016-05-20 NOTE — NC FL2 (Signed)
[place] MEDICAID FL2 LEVEL OF CARE SCREENING TOOL     IDENTIFICATION  Patient Name: Sheila ModenaSarah F Dlugosz Birthdate: 04-29-1941 Sex: female Admission Date (Current Location): 05/13/2016  [place]   Facility and Address:  [facility], [address], [place], [place] [address]      Provider Number: [id number]  Attending Physician Name and Address:  Shaune PollackQing Bunnie Rehberg, MD  Relative Name and Phone Number:       Current Level of Care: Hospital Recommended Level of Care: Skilled Nursing Facility Prior Approval Number:    Date Approved/Denied:   PASRR Number: [id number]  Discharge Plan: SNF    Current Diagnoses: Patient Active Problem List   Diagnosis Date Noted  . COPD exacerbation (HCC) 05/13/2016    Orientation RESPIRATION BLADDER Height & Weight     Self, Place  Normal Continent Weight: 230 lb 2.6 oz (104.4 kg) Height:  5\' 7"  (170.2 cm)  BEHAVIORAL SYMPTOMS/MOOD NEUROLOGICAL BOWEL NUTRITION STATUS      Continent Diet (regular)  AMBULATORY STATUS COMMUNICATION OF NEEDS Skin   Extensive Assist Verbally Normal                       Personal Care Assistance Level of Assistance  Bathing, Feeding, Dressing Bathing Assistance: Limited assistance Feeding assistance: Limited assistance Dressing Assistance: Limited assistance     Functional Limitations Info  Sight, Hearing, Speech Sight Info: Adequate Hearing Info: Adequate Speech Info: Adequate    SPECIAL CARE FACTORS FREQUENCY  PT (By licensed PT), OT (By licensed OT)     PT Frequency: 5x OT Frequency: 5x            Contractures      Additional Factors Info  Code Status, Allergies, Psychotropic, Insulin Sliding Scale, Isolation Precautions Code Status Info: Full Code Allergies Info: NKA Psychotropic Info: Buspar, Celexa Insulin Sliding Scale Info: see MAR Isolation Precautions Info: on contact: MRSA     Current Medications (05/20/2016):  This is  the current hospital active medication list Current Facility-Administered Medications  Medication Dose Route Frequency Provider Last Rate Last Dose  . 0.9 %  sodium chloride infusion  250 mL Intravenous PRN Eugenie Norrieana G Blakeney, NP 20 mL/hr at 05/14/16 0000 250 mL at 05/14/16 0000  . acetaminophen (TYLENOL) tablet 650 mg  650 mg Oral Q4H PRN Nelda Bucksaniel J Feinstein, MD   650 mg at 05/17/16 2330  . budesonide (PULMICORT) nebulizer solution 0.5 mg  0.5 mg Nebulization BID Erin FullingKurian Kasa, MD   0.5 mg at 05/20/16 0804  . citalopram (CELEXA) tablet 20 mg  20 mg Oral Daily Eugenie Norrieana G Blakeney, NP   20 mg at 05/20/16 0823  . docusate sodium (COLACE) capsule 100 mg  100 mg Oral BID Erin FullingKurian Kasa, MD   100 mg at 05/20/16 [id number]  . enoxaparin (LOVENOX) injection 40 mg  40 mg Subcutaneous Q24H Shane CrutchPradeep Ramachandran, MD   40 mg at 05/20/16 0823  . famotidine (PEPCID) tablet 20 mg  20 mg Oral Daily Shane CrutchPradeep Ramachandran, MD   20 mg at 05/20/16 0823  . insulin aspart (novoLOG) injection 0-20 Units  0-20 Units Subcutaneous Q4H Eugenie Norrieana G Blakeney, NP   3 Units at 05/20/16 1202  . insulin glargine (LANTUS) injection 10 Units  10 Units Subcutaneous Daily Erin FullingKurian Kasa, MD   10 Units at 05/20/16 [phone]  . ipratropium-albuterol (DUONEB) 0.5-2.5 (3) MG/3ML nebulizer solution 3 mL  3 mL Nebulization Q4H Eugenie Norrieana G Blakeney, NP   3 mL  at 05/20/16 1123  . ipratropium-albuterol (DUONEB) 0.5-2.5 (3) MG/3ML nebulizer solution 3 mL  3 mL Nebulization Q4H PRN Eugenie Norrieana G Blakeney, NP   3 mL at 05/17/16 0727  . levofloxacin (LEVAQUIN) tablet 750 mg  750 mg Oral Daily Rolm BaptiseHolly N Gilliam, RPH   750 mg at 05/20/16 [id number]  . levothyroxine (SYNTHROID, LEVOTHROID) tablet 137 mcg  137 mcg Oral QAC breakfast Shane CrutchPradeep Ramachandran, MD   137 mcg at 05/20/16 0625  . MEDLINE mouth rinse  15 mL Mouth Rinse BID Shane CrutchPradeep Ramachandran, MD   15 mL at 05/20/16 0845  . methylPREDNISolone sodium succinate (SOLU-MEDROL) 40 mg/mL injection 40 mg  40 mg Intravenous Daily Erin FullingKurian Kasa, MD   40 mg  at 05/20/16 0823  . metoprolol tartrate (LOPRESSOR) tablet 12.5 mg  12.5 mg Oral BID Lewie LoronMagadalene S Tukov, NP   12.5 mg at 05/20/16 0823  . mometasone-formoterol (DULERA) 100-5 MCG/ACT inhaler 2 puff  2 puff Inhalation BID Erin FullingKurian Kasa, MD   2 puff at 05/20/16 [id number]  . senna (SENOKOT) tablet 8.6 mg  1 tablet Oral BID Erin FullingKurian Kasa, MD   8.6 mg at 05/20/16 [id number]  . tiotropium (SPIRIVA) inhalation capsule 18 mcg  18 mcg Inhalation Daily Erin FullingKurian Kasa, MD   18 mcg at 05/20/16 [id number]  . vancomycin (VANCOCIN) IVPB 1000 mg/200 mL premix  1,000 mg Intravenous Q12H Erin FullingKurian Kasa, MD   1,000 mg at 05/20/16 [id number]     Discharge Medications: Please see discharge summary for a list of discharge medications.  Relevant Imaging Results:  Relevant Lab Results:   Additional Information SSN: [id number]  Raye SorrowCoble, Hannah N, [place]

## 2016-05-20 NOTE — Progress Notes (Signed)
Report called to Konrad DoloresKim Hicks at [facility]-saline lock removed-EMS notified for transport-niece aware of transfer.

## 2016-05-20 NOTE — Progress Notes (Addendum)
LCSW attempted to contact patient's niece Inetta Fermoina [phone]) to discuss disposition and recommendations for SNF.  UNable to reach was unable to leave a message due to mailbox full. Will continue to assist with disposition and planning.  Deretha EmoryHannah Atziri Zubiate LCSW, MSW Clinical Social Work: Optician, dispensingystem Wide Float Coverage for :  [phone]

## 2016-05-20 NOTE — Clinical Social Work Note (Signed)
Clinical Social Work Assessment  Patient Details  Name: Sheila ModenaSarah F Redd MRN: [id number] Date of Birth: 1941-03-31  Date of referral:  05/20/16               Reason for consult:  Facility Placement                Permission sought to share information with:  Case Manager, Magazine features editoracility Contact Representative, Family Supports Permission granted to share information::  Yes, Verbal Permission Granted  Name::        Agency::  SNF HUB  Relationship::  Neice Ambulance personTina  Contact Information:     Housing/Transportation Living arrangements for the past 2 months:  Single Family Home Source of Information:  Medical Team, Case Manager, Other (Comment Required) (neice Quarry managerTina) Patient Interpreter Needed:  None Criminal Activity/Legal Involvement Pertinent to Current Situation/Hospitalization:  No - Comment as needed Significant Relationships:  Other Family Members Lives with:  Self (has a great Neice whom lives there temporarly) Do you feel safe going back to the place where you live?  No Need for family participation in patient care:  Yes (Comment) (patient a little confused)  Care giving concerns:  Per niece, patient lives home alone, but has a great niece who lives with her for a short time with two children.  Reports patient is non compliant with most medications and is concerned with her confusion and memory issues. She reports she acts differently on medications and patient does have an infection which could be contributing to the confusion. Overall she is pleasant, however at this time niece feels she is not at baseline and agrees with recommendations for ST SNF.   Social Worker assessment / plan:  LCSW completed assessment and completed SNF work up. Will present bed offers with niece once available.   Employment status:  Retired Health and safety inspectornsurance information:  [employer] PT Recommendations:  Skilled Nursing Facility Information / Referral to community resources:  Skilled Nursing Facility  Patient/Family's  Response to care:  Agreeable to plan Patient/Family's Understanding of and Emotional Response to Diagnosis, Current Treatment, and Prognosis:  Family and patient unable to explain reason for confusion, but active in treatment and understanding of recommendation of SNF due to not being safe to DC home or at baseline.  Emotional Assessment Appearance:  Appears stated age Attitude/Demeanor/Rapport:  Other (cooperative, confused) Affect (typically observed):  Accepting, Adaptable Orientation:  Oriented to Self, Oriented to Place Alcohol / Substance use:  Not Applicable Psych involvement (Current and /or in the community):  No (Comment)  Discharge Needs  Concerns to be addressed:  No discharge needs identified Readmission within the last 30 days:  No Current discharge risk:  None Barriers to Discharge:  No Barriers Identified, Continued Medical Work up   Raye SorrowCoble, Charlotta Lapaglia N, LCSW 05/20/2016, 12:15 PM

## 2016-05-20 NOTE — Discharge Instructions (Signed)
Heart healthy and ADA diet. Fall precaution. Palliative care at SNF.

## 2016-05-20 NOTE — Discharge Summary (Signed)
[employer] - [place] at [facility]   PATIENT NAME: Sheila Hayes    MR#:  [id number]  DATE OF BIRTH:  December 21, 1940  DATE OF ADMISSION:  05/13/2016   ADMITTING PHYSICIAN: Shane CrutchPradeep Ramachandran, MD  DATE OF DISCHARGE: 05/20/2016 PRIMARY CARE PHYSICIAN: Rayetta HumphreyGeorge, Sionne A, MD   ADMISSION DIAGNOSIS:  COPD exacerbation (HCC) [J44.1] Acute respiratory failure with hypoxia and hypercapnia (HCC) [J96.01, J96.02] DISCHARGE DIAGNOSIS:  Active Problems:   COPD exacerbation (HCC) Acute on chronic hypercapnic/hypoxic respiratory failure CAP and Acute on chronic diastolic heart failure SECONDARY DIAGNOSIS:   Past Medical History:  Diagnosis Date  . Chronic diastolic congestive heart failure (HCC)   . COPD (chronic obstructive pulmonary disease) (HCC)   . Diabetes mellitus without complication (HCC)   . Hyperlipidemia   . Hypertension   . Hypothyroidism   . Pulmonary hypertension    HOSPITAL COURSE:   75 year old female with a history of COPD not on oxygen and pulmonary hypertension who presented to the ICU with acute on chronic hypercapnic/hypoxic respiratory failure due to COPD exacerbation.  1. Acute on chronic hypercapnic/hypoxic respiratory failure in the setting of COPD exacerbation/CAP and Acute on chronic diastolic heart failure: Patient was initially intubated and extubated. She is transitioned to nasal cannula 3 L. Continue to taper steroids Continue inhalers and nebulizer treatments She is treated with vancomycin,added Levaquin, continue po levaquin. Continue Lasix  2. Acute on chronic diastolic heart failure with preserved ejection fraction/pulmonary hypertension: Treated with IV Lasix, discontinued due to low BP.  3. CAP: MRSA PCR is positive  On vancomycin and added Levaquin.  4. Diabetes: Continue sliding scale.  5. Hypothyroid: Continue Synthroid  6. Hypertension on metoprolol, but decreased to 12.5 mg bid due to low side BP.  DISCHARGE  CONDITIONS:  Stable, discharge to SNF today. CONSULTS OBTAINED:   DRUG ALLERGIES:  No Known Allergies DISCHARGE MEDICATIONS:     Medication List    TAKE these medications   albuterol 108 (90 Base) MCG/ACT inhaler Commonly known as:  PROVENTIL HFA;VENTOLIN HFA Inhale 2 puffs into the lungs every 6 (six) hours as needed for wheezing or shortness of breath.   aspirin EC 81 MG tablet Take 81 mg by mouth daily.   budesonide-formoterol 160-4.5 MCG/ACT inhaler Commonly known as:  SYMBICORT Inhale into the lungs 2 (two) times daily.   calcium carbonate 1500 (600 Ca) MG Tabs tablet Commonly known as:  OSCAL Take by mouth 2 (two) times daily with a meal.   citalopram 20 MG tablet Commonly known as:  CELEXA Take 20 mg by mouth daily.   docusate calcium 240 MG capsule Commonly known as:  SURFAK Take 240 mg by mouth at bedtime.   gabapentin 100 MG capsule Commonly known as:  NEURONTIN Take 200 mg by mouth 2 (two) times daily.   guaiFENesin-dextromethorphan 100-10 MG/5ML syrup Commonly known as:  ROBITUSSIN DM Take 5 mLs by mouth every 4 (four) hours as needed for cough.   levofloxacin 750 MG tablet Commonly known as:  LEVAQUIN Take 1 tablet (750 mg total) by mouth daily. Start taking on:  05/21/2016   levothyroxine 137 MCG tablet Commonly known as:  SYNTHROID, LEVOTHROID Take 137 mcg by mouth daily before breakfast.   metFORMIN 500 MG tablet Commonly known as:  GLUCOPHAGE Take by mouth 2 (two) times daily with a meal.   metoprolol tartrate 25 MG tablet Commonly known as:  LOPRESSOR Take 0.5 tablets (12.5 mg total) by mouth 2 (two) times daily. What changed:  how much  to take   omeprazole 20 MG capsule Commonly known as:  PRILOSEC Take 20 mg by mouth daily.   predniSONE 10 MG tablet Commonly known as:  DELTASONE 40 mg po daily for 2 days, 20 mg po daily for 2 days, 10 mg po daily for 3 days.   rosuvastatin 40 MG tablet Commonly known as:  CRESTOR Take 40 mg by  mouth daily.   tiotropium 18 MCG inhalation capsule Commonly known as:  SPIRIVA Place 18 mcg into inhaler and inhale daily.        DISCHARGE INSTRUCTIONS:  See AVS. OXYGEN:  Home Oxygen:yes, 3 L [place] DISCHARGE LOCATION:    If you experience worsening of your admission symptoms, develop shortness of breath, life threatening emergency, suicidal or homicidal thoughts you must seek medical attention immediately by calling 911 or calling your MD immediately  if symptoms less severe.  You Must read complete instructions/literature along with all the possible adverse reactions/side effects for all the Medicines you take and that have been prescribed to you. Take any new Medicines after you have completely understood and accpet all the possible adverse reactions/side effects.   Please note  You were cared for by a hospitalist during your hospital stay. If you have any questions about your discharge medications or the care you received while you were in the hospital after you are discharged, you can call the unit and asked to speak with the hospitalist on call if the hospitalist that took care of you is not available. Once you are discharged, your primary care physician will handle any further medical issues. Please note that NO REFILLS for any discharge medications will be authorized once you are discharged, as it is imperative that you return to your primary care physician (or establish a relationship with a primary care physician if you do not have one) for your aftercare needs so that they can reassess your need for medications and monitor your lab values.    On the day of Discharge:  VITAL SIGNS:  Blood pressure (!) 113/49, pulse 79, temperature 98.2 F (36.8 C), temperature source Oral, resp. rate 18, height 5\' 7"  (1.702 m), weight 230 lb 2.6 oz (104.4 kg), SpO2 (!) 89 %. PHYSICAL EXAMINATION:  GENERAL:  75 y.o.-year-old patient lying in the bed with no acute distress.  EYES: Pupils  equal, round, reactive to light and accommodation. No scleral icterus. Extraocular muscles intact.  HEENT: Head atraumatic, normocephalic. Oropharynx and nasopharynx clear.  NECK:  Supple, no jugular venous distention. No thyroid enlargement, no tenderness.  LUNGS: Normal breath sounds bilaterally, no wheezing, rales,rhonchi or crepitation. No use of accessory muscles of respiration.  CARDIOVASCULAR: S1, S2 normal. No murmurs, rubs, or gallops.  ABDOMEN: Soft, non-tender, non-distended. Bowel sounds present. No organomegaly or mass.  EXTREMITIES: No pedal edema, cyanosis, or clubbing.  NEUROLOGIC: Cranial nerves II through XII are intact. Muscle strength 5/5 in all extremities. Sensation intact. Gait not checked.  PSYCHIATRIC: The patient is alert and oriented x 3.  SKIN: No obvious rash, lesion, or ulcer.  DATA REVIEW:   CBC  Recent Labs Lab 05/18/16 0745  WBC 9.5  HGB 14.8  HCT 46.8  PLT 205    Chemistries   Recent Labs Lab 05/17/16 0637 05/18/16 0745  NA 140 136  K 4.1 4.7  CL 95* 88*  CO2 39* 43*  GLUCOSE 245* 170*  BUN 28* 25*  CREATININE 0.93 0.75  CALCIUM 8.3* 8.5*  MG 1.7  --  Microbiology Results  Results for orders placed or performed during the hospital encounter of 05/13/16  Culture, blood (routine x 2)     Status: None   Collection Time: 05/13/16  9:16 AM  Result Value Ref Range Status   Specimen Description BLOOD R AC  Final   Special Requests   Final    BOTTLES DRAWN AEROBIC AND ANAEROBIC AER 11ML ANA 15ML   Culture NO GROWTH 5 DAYS  Final   Report Status 05/18/2016 FINAL  Final  MRSA PCR Screening     Status: Abnormal   Collection Time: 05/13/16 12:13 PM  Result Value Ref Range Status   MRSA by PCR POSITIVE (A) NEGATIVE Final    Comment:        The GeneXpert MRSA Assay (FDA approved for NASAL specimens only), is one component of a comprehensive MRSA colonization surveillance program. It is not intended to diagnose MRSA infection nor to  guide or monitor treatment for MRSA infections. RESULT CALLED TO, READ BACK BY AND VERIFIED WITH: FELICIA PREUDHOMME ON 05/13/16 AT 1456 MNS   Culture, expectorated sputum-assessment     Status: None   Collection Time: 05/16/16  2:00 PM  Result Value Ref Range Status   Specimen Description TRACHEAL ASPIRATE  Final   Special Requests NONE  Final   Sputum evaluation THIS SPECIMEN IS ACCEPTABLE FOR SPUTUM CULTURE  Final   Report Status 05/16/2016 FINAL  Final  Culture, respiratory (NON-Expectorated)     Status: None   Collection Time: 05/16/16  2:00 PM  Result Value Ref Range Status   Specimen Description TRACHEAL ASPIRATE  Final   Special Requests NONE Reflexed from [id number]  Final   Gram Stain   Final    DEGENERATED CELLULAR MATERIAL PRESENT NO ORGANISMS SEEN    Culture   Final    NO GROWTH 2 DAYS [facility]formed [facility]    Report Status 05/19/2016 FINAL  Final    RADIOLOGY:  No results found.   Management plans discussed with the patient, family and they are in agreement.  CODE STATUS:     Code Status Orders        Start     Ordered   05/13/16 1117  Full code  Continuous     05/13/16 1117    Code Status History    Date Active Date Inactive Code Status Order ID Comments User Context   This patient has a current code status but no historical code status.    Advance Directive Documentation   Flowsheet Row Most Recent Value  Type of Advance Directive  Healthcare Power of Attorney  Pre-existing out of facility DNR order (yellow form or pink MOST form)  No data  "MOST" Form in Place?  No data      TOTAL TIME TAKING CARE OF THIS PATIENT: 36 minutes.    Shaune Pollackhen, Kirra Verga M.D on 05/20/2016 at 2:15 PM  Between 7am to 6pm - Pager - [phone]  After 6pm go to [email or web address] - Social research officer, governmentpassword [email or web address]  [facility] [place] [facility]  Office  [phone]  CC: Primary care physician; Rayetta HumphreyGeorge, Sionne A, MD   Note: This dictation was prepared with Dragon  dictation along with smaller phrase technology. Any transcriptional errors that result from this process are unintentional.

## 2016-05-20 NOTE — Care Management Important Message (Signed)
Important Message  Patient Details  Name: Melina ModenaSarah F Moret MRN: [id number] Date of Birth: 03/14/1941   Medicare Important Message Given:  Yes    Gwenette GreetBrenda S Starling Jessie, RN 05/20/2016, 10:55 AM

## 2016-05-20 NOTE — Progress Notes (Signed)
SATURATION QUALIFICATIONS: (This note is used to comply with regulatory documentation for home oxygen)    Patient Saturations on Room Air while Ambulating = 99%    Please briefly explain why patient needs home oxygen:

## 2016-07-24 ENCOUNTER — Emergency Department
Admission: EM | Admit: 2016-07-24 | Discharge: 2016-07-24 | Disposition: A | Discharge status: Home / Self Care | Payer: Medicare Other | Attending: Emergency Medicine | Admitting: Emergency Medicine

## 2016-07-24 ENCOUNTER — Encounter: Payer: Self-pay | Admitting: Emergency Medicine

## 2016-07-24 ENCOUNTER — Emergency Department: Payer: Medicare Other

## 2016-07-24 DIAGNOSIS — Z7984 Long term (current) use of oral hypoglycemic drugs: Secondary | ICD-10-CM | POA: Diagnosis not present

## 2016-07-24 DIAGNOSIS — F17200 Nicotine dependence, unspecified, uncomplicated: Secondary | ICD-10-CM | POA: Insufficient documentation

## 2016-07-24 DIAGNOSIS — I5032 Chronic diastolic (congestive) heart failure: Secondary | ICD-10-CM | POA: Insufficient documentation

## 2016-07-24 DIAGNOSIS — E119 Type 2 diabetes mellitus without complications: Secondary | ICD-10-CM | POA: Diagnosis not present

## 2016-07-24 DIAGNOSIS — R609 Edema, unspecified: Secondary | ICD-10-CM | POA: Insufficient documentation

## 2016-07-24 DIAGNOSIS — I110 Hypertensive heart disease with heart failure: Secondary | ICD-10-CM | POA: Insufficient documentation

## 2016-07-24 DIAGNOSIS — Z79899 Other long term (current) drug therapy: Secondary | ICD-10-CM | POA: Insufficient documentation

## 2016-07-24 DIAGNOSIS — J449 Chronic obstructive pulmonary disease, unspecified: Secondary | ICD-10-CM | POA: Insufficient documentation

## 2016-07-24 DIAGNOSIS — E039 Hypothyroidism, unspecified: Secondary | ICD-10-CM | POA: Diagnosis not present

## 2016-07-24 DIAGNOSIS — Z7982 Long term (current) use of aspirin: Secondary | ICD-10-CM | POA: Diagnosis not present

## 2016-07-24 DIAGNOSIS — R079 Chest pain, unspecified: Secondary | ICD-10-CM | POA: Diagnosis present

## 2016-07-24 LAB — BASIC METABOLIC PANEL
ANION GAP: 5 (ref 5–15)
BUN: 14 mg/dL (ref 6–20)
CALCIUM: 9.4 mg/dL (ref 8.9–10.3)
CO2: 37 mmol/L — ABNORMAL HIGH (ref 22–32)
Chloride: 95 mmol/L — ABNORMAL LOW (ref 101–111)
Creatinine, Ser: 0.74 mg/dL (ref 0.44–1.00)
GFR calc Af Amer: >60 mL/min (ref >60)
GLUCOSE: 178 mg/dL — AB (ref 65–99)
Potassium: 4.4 mmol/L (ref 3.5–5.1)
Sodium: 137 mmol/L (ref 135–145)

## 2016-07-24 LAB — CBC
HCT: 38 % (ref 35.0–47.0)
HEMOGLOBIN: 12.2 g/dL (ref 12.0–16.0)
MCH: 27.7 pg (ref 26.0–34.0)
MCHC: 32.2 g/dL (ref 32.0–36.0)
MCV: 86.2 fL (ref 80.0–100.0)
Platelets: 164 10*3/uL (ref 150–440)
RBC: 4.41 MIL/uL (ref 3.80–5.20)
RDW: 15 % — AB (ref 11.5–14.5)
WBC: 6.2 10*3/uL (ref 3.6–11.0)

## 2016-07-24 LAB — TROPONIN I: Troponin I: <0.03 ng/mL (ref <0.03)

## 2016-07-24 LAB — BRAIN NATRIURETIC PEPTIDE: B NATRIURETIC PEPTIDE 5: 50 pg/mL (ref 0.0–100.0)

## 2016-07-24 MED ORDER — NITROGLYCERIN 0.4 MG SL SUBL
0.4000 mg | SUBLINGUAL_TABLET | Freq: Once | SUBLINGUAL | Status: AC
  Administered 2016-07-24: 0.4 mg via SUBLINGUAL
  Filled 2016-07-24: qty 1

## 2016-07-24 NOTE — ED Triage Notes (Signed)
Patient from [facility] via [facility]. Patient reports chest pain starting one week ago. Denies SOB, dizziness or nausea/vomiting. Patient reports today pain started radiating to her shoulder blades. 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

## 2016-07-24 NOTE — ED Notes (Signed)
Family transporting patient back to [facility]. Report called and discharge instructions given to Lafonda MossesDiana at [facility] and discharge instructions review with family.

## 2016-07-24 NOTE — ED Provider Notes (Signed)
[facility] Emergency Department Provider Note        Time seen: ----------------------------------------- 7:30 PM on 07/24/2016 -----------------------------------------    I have reviewed the triage vital signs and the nursing notes.   HISTORY  Chief Complaint Chest Pain    HPI Sheila Hayes is a 76 y.o. female who presents to the ER being brought from [facility] via EMS. Patient reports chest pain starting one week ago. Pain is in the right side of her chest radiating into the right shoulder blade. She has not had dizziness, shortness of breath or nausea. Patient has a history of sudden cardiac arrest last year. She denies any other complaints at this time.   Past Medical History:  Diagnosis Date  . Chronic diastolic congestive heart failure (HCC)   . COPD (chronic obstructive pulmonary disease) (HCC)   . Diabetes mellitus without complication (HCC)   . Hyperlipidemia   . Hypertension   . Hypothyroidism   . Pulmonary hypertension     Patient Active Problem List   Diagnosis Date Noted  . COPD exacerbation (HCC) 05/13/2016    No past surgical history on file.  Allergies Patient has no known allergies.  Social History Social History  Substance Use Topics  . Smoking status: Current Every Day Smoker  . Smokeless tobacco: Never Used  . Alcohol use Not on file    Review of Systems Constitutional: Negative for fever. Cardiovascular:Positive for chest pain Respiratory: Negative for shortness of breath. Gastrointestinal: Negative for abdominal pain, vomiting and diarrhea. Genitourinary: Negative for dysuria. Musculoskeletal: Negative for back pain. Skin: Negative for rash. Neurological: Negative for headaches, focal weakness or numbness.  10-point ROS otherwise negative.  ____________________________________________   PHYSICAL EXAM:  VITAL SIGNS: ED Triage Vitals  Enc Vitals Group     BP      Pulse      Resp       Temp      Temp src      SpO2      Weight      Height      Head Circumference      Peak Flow      Pain Score      Pain Loc      Pain Edu?      Excl. in GC?     Constitutional: Alert and oriented. Well appearing and in no distress. Eyes: Conjunctivae are normal. PERRL. Normal extraocular movements. ENT   Head: Normocephalic and atraumatic.   Nose: No congestion/rhinnorhea.   Mouth/Throat: Mucous membranes are moist.   Neck: No stridor. Cardiovascular: Normal rate, regular rhythm. No murmurs, rubs, or gallops. Respiratory: Normal respiratory effort without tachypnea nor retractions. Breath sounds are clear and equal bilaterally. No wheezes/rales/rhonchi. Gastrointestinal: Soft and nontender. Normal bowel sounds Musculoskeletal: Nontender with normal range of motion in all extremities. No lower extremity tenderness nor edema. Neurologic:  Normal speech and language. No gross focal neurologic deficits are appreciated.  Skin:  Skin is warm, dry and intact. No rash noted. Psychiatric: Mood and affect are normal. Speech and behavior are normal.  ____________________________________________  EKG: Interpreted by me. Sinus rhythm rate of 63 bpm, normal PR interval, wide QRS, normal QT, right bundle branch block.  ____________________________________________  ED COURSE:  Pertinent labs & imaging results that were available during my care of the patient were reviewed by me and considered in my medical decision making (see chart for details). Patient presents to the ER for chest pain of uncertain etiology.  We will assess with labs and imaging.   Procedures ____________________________________________   LABS (pertinent positives/negatives)  Labs Reviewed  BASIC METABOLIC PANEL - Abnormal; Notable for the following:       Result Value   Chloride 95 (*)    CO2 37 (*)    Glucose, Bld 178 (*)    All other components within normal limits  CBC - Abnormal; Notable for  the following:    RDW 15.0 (*)    All other components within normal limits  TROPONIN I  BRAIN NATRIURETIC PEPTIDE  TROPONIN I    RADIOLOGY Images were viewed by me  Chest x-ray IMPRESSION: Cardiomegaly and aortic atherosclerosis without overt pulmonary edema. ____________________________________________  FINAL ASSESSMENT AND PLAN  Chest pain  Plan: Patient with labs and imaging as dictated above. No clear etiology for patient's right-sided chest pain at this time. Serial troponins are negative. I will advise Lasix daily for peripheral edema. She is stable for outpatient follow-up with her cardiologist.   Emily Filbert, MD   Note: This note was generated in part or whole with voice recognition software. Voice recognition is usually quite accurate but there are transcription errors that can and very often do occur. I apologize for any typographical errors that were not detected and corrected.     Emily Filbert, MD 07/24/16 2236

## 2016-07-27 ENCOUNTER — Telehealth: Payer: Self-pay

## 2016-07-27 NOTE — Telephone Encounter (Signed)
Lmov for patient for patient was seen in ED on 07/24/16  Seen for CP  Will try again at a later time

## 2016-08-06 NOTE — Telephone Encounter (Signed)
Lmov for patient for patient was seen in ED on 07/24/16  Seen for CP  Will try again at a later time

## 2016-08-11 NOTE — Telephone Encounter (Signed)
Sent letter

## 2016-09-13 ENCOUNTER — Encounter: Payer: Self-pay | Admitting: Emergency Medicine

## 2016-09-13 ENCOUNTER — Emergency Department: Payer: Medicare Other

## 2016-09-13 ENCOUNTER — Emergency Department
Admission: EM | Admit: 2016-09-13 | Discharge: 2016-09-14 | Disposition: A | Discharge status: Home / Self Care | Payer: Medicare Other | Attending: Emergency Medicine | Admitting: Emergency Medicine

## 2016-09-13 DIAGNOSIS — E039 Hypothyroidism, unspecified: Secondary | ICD-10-CM | POA: Diagnosis not present

## 2016-09-13 DIAGNOSIS — Z87891 Personal history of nicotine dependence: Secondary | ICD-10-CM | POA: Insufficient documentation

## 2016-09-13 DIAGNOSIS — Y999 Unspecified external cause status: Secondary | ICD-10-CM | POA: Insufficient documentation

## 2016-09-13 DIAGNOSIS — W19XXXA Unspecified fall, initial encounter: Secondary | ICD-10-CM

## 2016-09-13 DIAGNOSIS — J449 Chronic obstructive pulmonary disease, unspecified: Secondary | ICD-10-CM | POA: Diagnosis not present

## 2016-09-13 DIAGNOSIS — Z79899 Other long term (current) drug therapy: Secondary | ICD-10-CM | POA: Insufficient documentation

## 2016-09-13 DIAGNOSIS — Z7982 Long term (current) use of aspirin: Secondary | ICD-10-CM | POA: Diagnosis not present

## 2016-09-13 DIAGNOSIS — T148XXA Other injury of unspecified body region, initial encounter: Secondary | ICD-10-CM

## 2016-09-13 DIAGNOSIS — S51811A Laceration without foreign body of right forearm, initial encounter: Secondary | ICD-10-CM | POA: Diagnosis not present

## 2016-09-13 DIAGNOSIS — I110 Hypertensive heart disease with heart failure: Secondary | ICD-10-CM | POA: Insufficient documentation

## 2016-09-13 DIAGNOSIS — S0990XA Unspecified injury of head, initial encounter: Secondary | ICD-10-CM | POA: Insufficient documentation

## 2016-09-13 DIAGNOSIS — S51011A Laceration without foreign body of right elbow, initial encounter: Secondary | ICD-10-CM | POA: Diagnosis not present

## 2016-09-13 DIAGNOSIS — S51812A Laceration without foreign body of left forearm, initial encounter: Secondary | ICD-10-CM | POA: Diagnosis not present

## 2016-09-13 DIAGNOSIS — E119 Type 2 diabetes mellitus without complications: Secondary | ICD-10-CM | POA: Insufficient documentation

## 2016-09-13 DIAGNOSIS — S60212A Contusion of left wrist, initial encounter: Secondary | ICD-10-CM | POA: Diagnosis not present

## 2016-09-13 DIAGNOSIS — T07XXXA Unspecified multiple injuries, initial encounter: Secondary | ICD-10-CM

## 2016-09-13 DIAGNOSIS — W06XXXA Fall from bed, initial encounter: Secondary | ICD-10-CM | POA: Diagnosis not present

## 2016-09-13 DIAGNOSIS — Z7984 Long term (current) use of oral hypoglycemic drugs: Secondary | ICD-10-CM | POA: Insufficient documentation

## 2016-09-13 DIAGNOSIS — Y9384 Activity, sleeping: Secondary | ICD-10-CM | POA: Insufficient documentation

## 2016-09-13 DIAGNOSIS — S59911A Unspecified injury of right forearm, initial encounter: Secondary | ICD-10-CM | POA: Diagnosis present

## 2016-09-13 DIAGNOSIS — I5032 Chronic diastolic (congestive) heart failure: Secondary | ICD-10-CM | POA: Diagnosis not present

## 2016-09-13 DIAGNOSIS — Y929 Unspecified place or not applicable: Secondary | ICD-10-CM | POA: Diagnosis not present

## 2016-09-13 MED ORDER — BACITRACIN ZINC 500 UNIT/GM EX OINT
TOPICAL_OINTMENT | Freq: Two times a day (BID) | CUTANEOUS | Status: DC
  Administered 2016-09-14: 1 via TOPICAL
  Filled 2016-09-13: qty 0.9

## 2016-09-13 NOTE — ED Triage Notes (Signed)
Pt [facility] via [facility]; pt was sleeping in bed when she tried to roll over and fell off the bed onto the floor; pt with guaze dressings to bilateral forearms placed by EMS for skin tears; bruising to back of left hand; pt denies hitting her head, loss of consciousness or any other pain than location of skin tears; pt is awake and alert, oriented x 3; talking in complete coherent sentences;

## 2016-09-14 DIAGNOSIS — S51811A Laceration without foreign body of right forearm, initial encounter: Secondary | ICD-10-CM | POA: Diagnosis not present

## 2016-09-14 NOTE — ED Notes (Signed)
MD notified patient has had her skin tear wounds cleaned, bacitracin applied, and is bandaged up.

## 2016-09-14 NOTE — ED Notes (Signed)
EMS called to take [facility] back to [facility]

## 2016-09-14 NOTE — ED Notes (Signed)
Patient has no transportation home, her "floor aid" at [facility] was going to come get her at the end of her shift tonight (11pm) but it is too late and she has gone home.

## 2016-09-14 NOTE — ED Provider Notes (Signed)
[facility] Emergency Department Provider Note   ____________________________________________   First MD Initiated Contact with Patient 09/13/16 2303     (approximate)  I have reviewed the triage vital signs and the nursing notes.   HISTORY  Chief Complaint Fall    HPI Sheila Hayes is a 76 y.o. female who comes into the hospital today because she fell out of bed. The patient reports that she simply rolled out of bed. She denies hitting her head but has some cuts on her arms. The patient denies any pain. She denies passing out. She hasn't rolled out of bed since she was a child but she reports that she simply just rolled and fell out of bed. The patient has no chest pain, no shortness of breath, no abdominal pain, no nausea no vomiting, no headache, no dizziness, no weakness. She wears home O2 chronically. The patient has no other complaints and she denies pain at this time. She is here today for evaluation for her fall.   Past Medical History:  Diagnosis Date  . Chronic diastolic congestive heart failure (HCC)   . COPD (chronic obstructive pulmonary disease) (HCC)   . Diabetes mellitus without complication (HCC)   . Hyperlipidemia   . Hypertension   . Hypothyroidism   . Pulmonary hypertension     Patient Active Problem List   Diagnosis Date Noted  . COPD exacerbation (HCC) 05/13/2016    Past Surgical History:  Procedure Laterality Date  . APPENDECTOMY    . CHOLECYSTECTOMY      Prior to Admission medications   Medication Sig Start Date End Date Taking? Authorizing Provider  albuterol (PROVENTIL HFA;VENTOLIN HFA) 108 (90 Base) MCG/ACT inhaler Inhale 2 puffs into the lungs every 6 (six) hours as needed for wheezing or shortness of breath.   Yes Historical Provider, MD  aspirin EC 81 MG tablet Take 81 mg by mouth daily.   Yes Historical Provider, MD  calcium carbonate (OSCAL) 1500 (600 Ca) MG TABS tablet Take by mouth 2 (two) times daily  with a meal.   Yes Historical Provider, MD  citalopram (CELEXA) 20 MG tablet Take 20 mg by mouth daily.   Yes Historical Provider, MD  docusate sodium (COLACE) 100 MG capsule Take 200 mg by mouth at bedtime.   Yes Historical Provider, MD  furosemide (LASIX) 20 MG tablet Take 20 mg by mouth daily.   Yes Historical Provider, MD  gabapentin (NEURONTIN) 100 MG capsule Take 200 mg by mouth 2 (two) times daily.   Yes Historical Provider, MD  levothyroxine (SYNTHROID, LEVOTHROID) 137 MCG tablet Take 100 mcg by mouth daily before breakfast.    Yes Historical Provider, MD  metFORMIN (GLUCOPHAGE) 500 MG tablet Take 1,000 mg by mouth 2 (two) times daily with a meal.    Yes Historical Provider, MD  metoprolol tartrate (LOPRESSOR) 25 MG tablet Take 0.5 tablets (12.5 mg total) by mouth 2 (two) times daily. 05/20/16  Yes Shaune Pollack, MD  nystatin (NYSTATIN) powder Apply topically 2 (two) times daily as needed. For fungal growth   Yes Historical Provider, MD  omeprazole (PRILOSEC) 20 MG capsule Take 20 mg by mouth daily.   Yes Historical Provider, MD  potassium chloride (K-DUR) 10 MEQ tablet Take 10 mEq by mouth daily.   Yes Historical Provider, MD  rosuvastatin (CRESTOR) 40 MG tablet Take 40 mg by mouth daily.   Yes Historical Provider, MD  tiotropium (SPIRIVA) 18 MCG inhalation capsule Place 18 mcg into inhaler and inhale  daily.   Yes Historical Provider, MD    Allergies Patient has no known allergies.  History reviewed. No pertinent family history.  Social History Social History  Substance Use Topics  . Smoking status: Former Games developermoker  . Smokeless tobacco: Never Used  . Alcohol use No    Review of Systems Constitutional: No fever/chills Eyes: No visual changes. ENT: No sore throat. Cardiovascular: Denies chest pain. Respiratory: Denies shortness of breath. Gastrointestinal: No abdominal pain.  No nausea, no vomiting.  No diarrhea.  No constipation. Genitourinary: Negative for  dysuria. Musculoskeletal: Negative for back pain. Skin: Bruises and skin tears Neurological: Negative for headaches, focal weakness or numbness.  10-point ROS otherwise negative.  ____________________________________________   PHYSICAL EXAM:  VITAL SIGNS: ED Triage Vitals  Enc Vitals Group     BP 09/13/16 2142 129/65     Pulse Rate 09/13/16 2142 71     Resp 09/13/16 2142 20     Temp 09/13/16 2142 98.8 F (37.1 C)     Temp Source 09/13/16 2142 Oral     SpO2 09/13/16 2134 90 %     Weight 09/13/16 2143 236 lb (107 kg)     Height 09/13/16 2143 5\' 7"  (1.702 m)     Head Circumference --      Peak Flow --      Pain Score 09/13/16 2143 4     Pain Loc --      Pain Edu? --      Excl. in GC? --     Constitutional: Alert and oriented. Well appearing and in no acute distress. Eyes: Conjunctivae are normal. PERRL. EOMI. Head: Atraumatic. Nose: No congestion/rhinnorhea. Mouth/Throat: Mucous membranes are moist.  Oropharynx non-erythematous. Neck: No cervical spine tenderness to palpation. Cardiovascular: Normal rate, regular rhythm. Grossly normal heart sounds.  Good peripheral circulation. Respiratory: Normal respiratory effort.  No retractions. Lungs CTAB. Gastrointestinal: Soft and nontender. No distention. Positive bowel sounds Musculoskeletal: No lower extremity tenderness nor edema.   Neurologic:  Normal speech and language.  Skin:  Skin is warm, dry skin tear to right elbow with contusion, skin tear to left forearm with contusion, contusion over left wrist. Psychiatric: Mood and affect are normal.   ____________________________________________   LABS (all labs ordered are listed, but only abnormal results are displayed)  Labs Reviewed - No data to display ____________________________________________  EKG  none ____________________________________________  RADIOLOGY  CT head and cervical  spine  ____________________________________________   PROCEDURES  Procedure(s) performed: None  Procedures  Critical Care performed: No  ____________________________________________   INITIAL IMPRESSION / ASSESSMENT AND PLAN / ED COURSE  Pertinent labs & imaging results that were available during my care of the patient were reviewed by me and considered in my medical decision making (see chart for details).  This is a 76 year old female who comes into the hospital today because she rolled out of bed. The patient denies any pain. She has some skin tears which we will clean with bacitracin and covered and some nonadhesive dressing. Although she denies hitting her head and passing out I still did a CT scan of her head and cervical spine to evaluate for other intracranial hemorrhage. The patient's CT scan is negative for acute traumatic intracranial injury. The patient will be discharged home to follow-up with her primary care physician.  Clinical Course as of Sep 15 19  Sun Sep 13, 2016  2354 1. No evidence of traumatic intracranial injury or fracture. 2. No evidence of acute fracture or subluxation along  the cervical spine. 3. Mild cortical volume loss and scattered small vessel ischemic microangiopathy. 4. Mild degenerative change along the cervical spine. 5. Mild emphysema at the lung apices. 6. Calcification at the carotid bifurcations bilaterally. Carotid ultrasound is recommended for further evaluation, when and as deemed clinically appropriate.   CT Head Wo Contrast [AW]    Clinical Course User Index [AW] Rebecka Apley, MD     ____________________________________________   FINAL CLINICAL IMPRESSION(S) / ED DIAGNOSES  Final diagnoses:  Multiple contusions  Multiple skin tears  Fall, initial encounter      NEW MEDICATIONS STARTED DURING THIS VISIT:  New Prescriptions   No medications on file     Note:  This document was prepared using Dragon  voice recognition software and may include unintentional dictation errors.    Rebecka Apley, MD 09/14/16 [phone]

## 2016-09-14 NOTE — ED Notes (Signed)
[facility] [address]

## 2016-11-14 ENCOUNTER — Emergency Department: Payer: Medicare Other

## 2016-11-14 ENCOUNTER — Emergency Department
Admission: EM | Admit: 2016-11-14 | Discharge: 2016-11-14 | Disposition: A | Discharge status: Home / Self Care | Payer: Medicare Other | Attending: Emergency Medicine | Admitting: Emergency Medicine

## 2016-11-14 DIAGNOSIS — J449 Chronic obstructive pulmonary disease, unspecified: Secondary | ICD-10-CM | POA: Diagnosis not present

## 2016-11-14 DIAGNOSIS — S0083XA Contusion of other part of head, initial encounter: Secondary | ICD-10-CM | POA: Insufficient documentation

## 2016-11-14 DIAGNOSIS — N390 Urinary tract infection, site not specified: Secondary | ICD-10-CM | POA: Insufficient documentation

## 2016-11-14 DIAGNOSIS — Y939 Activity, unspecified: Secondary | ICD-10-CM | POA: Diagnosis not present

## 2016-11-14 DIAGNOSIS — I110 Hypertensive heart disease with heart failure: Secondary | ICD-10-CM | POA: Insufficient documentation

## 2016-11-14 DIAGNOSIS — E119 Type 2 diabetes mellitus without complications: Secondary | ICD-10-CM | POA: Insufficient documentation

## 2016-11-14 DIAGNOSIS — Z7984 Long term (current) use of oral hypoglycemic drugs: Secondary | ICD-10-CM | POA: Diagnosis not present

## 2016-11-14 DIAGNOSIS — Z7982 Long term (current) use of aspirin: Secondary | ICD-10-CM | POA: Insufficient documentation

## 2016-11-14 DIAGNOSIS — Y92129 Unspecified place in nursing home as the place of occurrence of the external cause: Secondary | ICD-10-CM | POA: Insufficient documentation

## 2016-11-14 DIAGNOSIS — W06XXXA Fall from bed, initial encounter: Secondary | ICD-10-CM | POA: Diagnosis not present

## 2016-11-14 DIAGNOSIS — W19XXXA Unspecified fall, initial encounter: Secondary | ICD-10-CM

## 2016-11-14 DIAGNOSIS — I5032 Chronic diastolic (congestive) heart failure: Secondary | ICD-10-CM | POA: Diagnosis not present

## 2016-11-14 DIAGNOSIS — Z87891 Personal history of nicotine dependence: Secondary | ICD-10-CM | POA: Insufficient documentation

## 2016-11-14 DIAGNOSIS — E039 Hypothyroidism, unspecified: Secondary | ICD-10-CM | POA: Diagnosis not present

## 2016-11-14 DIAGNOSIS — S0990XA Unspecified injury of head, initial encounter: Secondary | ICD-10-CM | POA: Diagnosis present

## 2016-11-14 DIAGNOSIS — Y999 Unspecified external cause status: Secondary | ICD-10-CM | POA: Diagnosis not present

## 2016-11-14 LAB — URINALYSIS, COMPLETE (UACMP) WITH MICROSCOPIC
Bilirubin Urine: NEGATIVE
Glucose, UA: NEGATIVE mg/dL
KETONES UR: NEGATIVE mg/dL
Nitrite: POSITIVE — AB
PROTEIN: 30 mg/dL — AB
Specific Gravity, Urine: 1.019 (ref 1.005–1.030)
pH: 5 (ref 5.0–8.0)

## 2016-11-14 LAB — CBC
HEMATOCRIT: 39 % (ref 35.0–47.0)
HEMOGLOBIN: 12.9 g/dL (ref 12.0–16.0)
MCH: 29.2 pg (ref 26.0–34.0)
MCHC: 33 g/dL (ref 32.0–36.0)
MCV: 88.3 fL (ref 80.0–100.0)
Platelets: 183 10*3/uL (ref 150–440)
RBC: 4.41 MIL/uL (ref 3.80–5.20)
RDW: 12.4 % (ref 11.5–14.5)
WBC: 5.9 10*3/uL (ref 3.6–11.0)

## 2016-11-14 LAB — BASIC METABOLIC PANEL
Anion gap: 7 (ref 5–15)
BUN: 19 mg/dL (ref 6–20)
CHLORIDE: 95 mmol/L — AB (ref 101–111)
CO2: 37 mmol/L — AB (ref 22–32)
CREATININE: 0.79 mg/dL (ref 0.44–1.00)
Calcium: 8.9 mg/dL (ref 8.9–10.3)
GFR calc Af Amer: >60 mL/min (ref >60)
GFR calc non Af Amer: >60 mL/min (ref >60)
Glucose, Bld: 215 mg/dL — ABNORMAL HIGH (ref 65–99)
Potassium: 3.7 mmol/L (ref 3.5–5.1)
Sodium: 139 mmol/L (ref 135–145)

## 2016-11-14 MED ORDER — DEXTROSE 5 % IV SOLN
1.0000 g | Freq: Once | INTRAVENOUS | Status: AC
  Administered 2016-11-14: 1 g via INTRAVENOUS
  Filled 2016-11-14: qty 10

## 2016-11-14 MED ORDER — CEFTRIAXONE SODIUM IN DEXTROSE 20 MG/ML IV SOLN
1.0000 g | Freq: Once | INTRAVENOUS | Status: DC
  Filled 2016-11-14: qty 50

## 2016-11-14 MED ORDER — CEPHALEXIN 500 MG PO CAPS: 500.0000 mg | ORAL_CAPSULE | Freq: Four times a day (QID) | ORAL | 0 refills | Status: AC

## 2016-11-14 NOTE — ED Notes (Signed)
[facility]. Pt did not have oxygen to take with her, encouraged her to use ambulance transport service but declined. States she will go "hours without oxygen to rest her nostrils" and states she does not use oxygen when she is showering.  Patient used oxygen canister from hospital up until it was time to get physically into the car.  Explained the risks of going without oxygen and instructed family and patient to call 911 if she became short of breath. Also instructed [place] [facility] [place].  [facility] [place], however, no answer to give them a report. Pt is alert and oriented and verbalized understanding of DC instructions. Patient able to ambulate in the room with minimal assistance as she normally uses a walker at [facility] Informed Dr. Darnelle CatalanMalinda of patient's oxygen issue and is agreeable with the plan if the patient and family are agreeable.

## 2016-11-14 NOTE — ED Triage Notes (Signed)
[facility] falling out of bed while sleeping and hit her head. She has an abrasion on her right cheek. Pt has a Hx of falling out of bed. She states having no neck or back pain. No LOC. Pt alert and oriented. Pt states she takes 81mg  of ASA. VS per EMS include BP- 152/90 HR-90 O2-96%. Pt has a blood sugar of 224.

## 2016-11-14 NOTE — ED Provider Notes (Signed)
[facility] Emergency Department Provider Note   ____________________________________________   First MD Initiated Contact with Patient 11/14/16 [phone]     (approximate)  I have reviewed the triage vital signs and the nursing notes.   HISTORY  Chief Complaint Fall    HPI Melina ModenaSarah F Tsutsui is a 76 y.o. female who comes into the hospital today after falling out of bed. The patient is from a nursing facility and she rolled out of bed. The patient has no complaints and EMS state they're unsure if she hit anything. The patient has a history of COPD and is on 2 L. The patient has rolled out of bed before recently. The patient denies any loss of consciousness and denies any current pain. She denies chest pain or shortness of breath. She has had some trouble with her urination. She reports that it burns when she please and she has some lower abdominal discomfort. The patient reports that she thinks she may have hit her head on the nightstand. She was waking up and when she rolled over she went to a bar and fell out of the bed. The patient denies any other pain and is here for evaluation.   Past Medical History:  Diagnosis Date  . Chronic diastolic congestive heart failure (HCC)   . COPD (chronic obstructive pulmonary disease) (HCC)   . Diabetes mellitus without complication (HCC)   . Hyperlipidemia   . Hypertension   . Hypothyroidism   . Pulmonary hypertension [facility](HCC)     Patient Active Problem List   Diagnosis Date Noted  . COPD exacerbation (HCC) 05/13/2016    Past Surgical History:  Procedure Laterality Date  . APPENDECTOMY    . CHOLECYSTECTOMY      Prior to Admission medications   Medication Sig Start Date End Date Taking? Authorizing Provider  albuterol (PROVENTIL HFA;VENTOLIN HFA) 108 (90 Base) MCG/ACT inhaler Inhale 2 puffs into the lungs every 6 (six) hours as needed for wheezing or shortness of breath.    [provider]  aspirin EC 81  MG tablet Take 81 mg by mouth daily.    [provider]  calcium carbonate (OSCAL) 1500 (600 Ca) MG TABS tablet Take by mouth 2 (two) times daily with a meal.    [provider]  cephALEXin (KEFLEX) 500 MG capsule Take 1 capsule (500 mg total) by mouth 4 (four) times daily. 11/14/16 11/24/16  Rebecka ApleyWebster, Marcey Persad P, MD  citalopram (CELEXA) 20 MG tablet Take 20 mg by mouth daily.    [provider]  docusate sodium (COLACE) 100 MG capsule Take 200 mg by mouth at bedtime.    [provider]  furosemide (LASIX) 20 MG tablet Take 20 mg by mouth daily.    [provider]  gabapentin (NEURONTIN) 100 MG capsule Take 200 mg by mouth 2 (two) times daily.    [provider]  levothyroxine (SYNTHROID, LEVOTHROID) 137 MCG tablet Take 100 mcg by mouth daily before breakfast.     [provider]  metFORMIN (GLUCOPHAGE) 500 MG tablet Take 1,000 mg by mouth 2 (two) times daily with a meal.     [provider]  metoprolol tartrate (LOPRESSOR) 25 MG tablet Take 0.5 tablets (12.5 mg total) by mouth 2 (two) times daily. 05/20/16   Shaune Pollackhen, Qing, MD  nystatin (NYSTATIN) powder Apply topically 2 (two) times daily as needed. For fungal growth    [provider]  omeprazole (PRILOSEC) 20 MG capsule Take 20 mg by mouth daily.  [provider]  potassium chloride (K-DUR) 10 MEQ tablet Take 10 mEq by mouth daily.    [provider]  rosuvastatin (CRESTOR) 40 MG tablet Take 40 mg by mouth daily.    [provider]  tiotropium (SPIRIVA) 18 MCG inhalation capsule Place 18 mcg into inhaler and inhale daily.    [provider]    Allergies Patient has no known allergies.  History reviewed. No pertinent family history.  Social History Social History  Substance Use Topics  . Smoking status: Former Games developer  . Smokeless tobacco: Never Used  . Alcohol use No    Review of Systems  Constitutional: No  fever/chills Eyes: No visual changes. ENT: No sore throat. Cardiovascular: Denies chest pain. Respiratory: Denies shortness of breath. Gastrointestinal: No abdominal pain.  No nausea, no vomiting.  No diarrhea.  No constipation. Genitourinary:  dysuria. Musculoskeletal: Negative for back pain. Skin: Negative for rash. Neurological: Negative for headaches, focal weakness or numbness.   ____________________________________________   PHYSICAL EXAM:  VITAL SIGNS: ED Triage Vitals  Enc Vitals Group     BP 11/14/16 0559 (!) 117/57     Pulse Rate 11/14/16 0559 73     Resp 11/14/16 0559 18     Temp 11/14/16 0559 97.7 F (36.5 C)     Temp Source 11/14/16 0559 Oral     SpO2 11/14/16 0559 92 %     Weight 11/14/16 0600 245 lb (111.1 kg)     Height 11/14/16 0600 5\' 7"  (1.702 m)     Head Circumference --      Peak Flow --      Pain Score --      Pain Loc --      Pain Edu? --      Excl. in GC? --     Constitutional: Alert and oriented. Well appearing and in no acute distress. Eyes: Conjunctivae are normal. PERRL. EOMI. Head:Small contusion to right face Nose: No congestion/rhinnorhea. Mouth/Throat: Mucous membranes are moist.  Oropharynx non-erythematous. Cardiovascular: Normal rate, regular rhythm. Grossly normal heart sounds.  Good peripheral circulation. Respiratory: Normal respiratory effort.  No retractions. Lungs CTAB. Gastrointestinal: Soft and nontender. No distention. Positive bowel sounds Musculoskeletal: No lower extremity tenderness nor edema.   Neurologic:  Normal speech and language.  Skin:  Skin is warm, dry and intact. Marland Kitchen Psychiatric: Mood and affect are normal.   ____________________________________________   LABS (all labs ordered are listed, but only abnormal results are displayed)  Labs Reviewed  BASIC METABOLIC PANEL - Abnormal; Notable for the following:       Result Value   Chloride 95 (*)    CO2 37 (*)    Glucose, Bld 215 (*)    All other  components within normal limits  URINALYSIS, COMPLETE (UACMP) WITH MICROSCOPIC - Abnormal; Notable for the following:    Color, Urine YELLOW (*)    APPearance CLOUDY (*)    Hgb urine dipstick SMALL (*)    Protein, ur 30 (*)    Nitrite POSITIVE (*)    Leukocytes, UA LARGE (*)    Bacteria, UA MANY (*)    Squamous Epithelial / LPF TOO NUMEROUS TO COUNT (*)    Non Squamous Epithelial 0-5 (*)    All other components within normal limits  CBC   ____________________________________________  EKG  none ____________________________________________  RADIOLOGY  CT head and cervical spine ____________________________________________   PROCEDURES  Procedure(s) performed: None  Procedures  Critical Care performed: No  ____________________________________________   INITIAL  IMPRESSION / ASSESSMENT AND PLAN / ED COURSE  Pertinent labs & imaging results that were available during my care of the patient were reviewed by me and considered in my medical decision making (see chart for details).  This is a 76 year old female who comes into the hospital today because she rolled out of bed. The patient did not have any loss of consciousness but does state she has some dysuria and some lower abdominal pain with urination. We'll check some blood work and some urine. I will also send the patient for CT scan of her head and cervical spine. The patient to be reassessed.  Clinical Course as of Nov 14 699  Sat Nov 14, 2016  1610 No acute intracranial abnormalities.  Straightening of usual cervical lordosis with slight anterior subluxations at C4-5 and C5-6. Alignment is unchanged since previous study suggesting chronic degenerative process. Degenerative changes in the cervical spine. No acute displaced fractures identified.   CT Head Wo Contrast [AW]    Clinical Course User Index [AW] Rebecka Apley, MD   It appears that the patient does have a urinary tract infection. She will  receive a dose of ceftriaxone. Her CT scan is unremarkable. We will get the patient up and walk her around and if she still feels comfortable she will be discharged to home.  ____________________________________________   FINAL CLINICAL IMPRESSION(S) / ED DIAGNOSES  Final diagnoses:  Fall, initial encounter  Lower urinary tract infectious disease  Contusion of face, initial encounter      NEW MEDICATIONS STARTED DURING THIS VISIT:  New Prescriptions   CEPHALEXIN (KEFLEX) 500 MG CAPSULE    Take 1 capsule (500 mg total) by mouth 4 (four) times daily.     Note:  This document was prepared using Dragon voice recognition software and may include unintentional dictation errors.    Rebecka Apley, MD 11/14/16 [phone]

## 2016-11-14 NOTE — ED Notes (Signed)
Pt placed on bedpan for urine sample. 

## 2016-11-14 NOTE — ED Notes (Signed)
Patient denies pain and is resting comfortably.  

## 2017-10-09 ENCOUNTER — Emergency Department: Payer: Medicare Other

## 2017-10-09 ENCOUNTER — Observation Stay
Admission: EM | Admit: 2017-10-09 | Discharge: 2017-10-11 | Disposition: A | Discharge status: Home Health | Payer: Medicare Other | Attending: Internal Medicine | Admitting: Internal Medicine

## 2017-10-09 ENCOUNTER — Other Ambulatory Visit: Payer: Self-pay

## 2017-10-09 DIAGNOSIS — S0003XA Contusion of scalp, initial encounter: Secondary | ICD-10-CM | POA: Insufficient documentation

## 2017-10-09 DIAGNOSIS — E039 Hypothyroidism, unspecified: Secondary | ICD-10-CM | POA: Insufficient documentation

## 2017-10-09 DIAGNOSIS — I110 Hypertensive heart disease with heart failure: Secondary | ICD-10-CM | POA: Diagnosis not present

## 2017-10-09 DIAGNOSIS — J9621 Acute and chronic respiratory failure with hypoxia: Secondary | ICD-10-CM | POA: Insufficient documentation

## 2017-10-09 DIAGNOSIS — I5032 Chronic diastolic (congestive) heart failure: Secondary | ICD-10-CM | POA: Diagnosis not present

## 2017-10-09 DIAGNOSIS — I272 Pulmonary hypertension, unspecified: Secondary | ICD-10-CM | POA: Insufficient documentation

## 2017-10-09 DIAGNOSIS — R55 Syncope and collapse: Secondary | ICD-10-CM | POA: Diagnosis present

## 2017-10-09 DIAGNOSIS — E119 Type 2 diabetes mellitus without complications: Secondary | ICD-10-CM

## 2017-10-09 DIAGNOSIS — E785 Hyperlipidemia, unspecified: Secondary | ICD-10-CM | POA: Insufficient documentation

## 2017-10-09 DIAGNOSIS — Z7982 Long term (current) use of aspirin: Secondary | ICD-10-CM | POA: Diagnosis not present

## 2017-10-09 DIAGNOSIS — J441 Chronic obstructive pulmonary disease with (acute) exacerbation: Secondary | ICD-10-CM | POA: Diagnosis present

## 2017-10-09 DIAGNOSIS — W19XXXA Unspecified fall, initial encounter: Secondary | ICD-10-CM | POA: Diagnosis not present

## 2017-10-09 DIAGNOSIS — Z87891 Personal history of nicotine dependence: Secondary | ICD-10-CM | POA: Insufficient documentation

## 2017-10-09 DIAGNOSIS — Z79899 Other long term (current) drug therapy: Secondary | ICD-10-CM | POA: Insufficient documentation

## 2017-10-09 DIAGNOSIS — I10 Essential (primary) hypertension: Secondary | ICD-10-CM | POA: Diagnosis present

## 2017-10-09 DIAGNOSIS — Z7984 Long term (current) use of oral hypoglycemic drugs: Secondary | ICD-10-CM | POA: Insufficient documentation

## 2017-10-09 DIAGNOSIS — M47812 Spondylosis without myelopathy or radiculopathy, cervical region: Secondary | ICD-10-CM | POA: Insufficient documentation

## 2017-10-09 LAB — URINALYSIS, COMPLETE (UACMP) WITH MICROSCOPIC
BILIRUBIN URINE: NEGATIVE
Glucose, UA: NEGATIVE mg/dL
Ketones, ur: NEGATIVE mg/dL
Nitrite: POSITIVE — AB
PH: 6 (ref 5.0–8.0)
Protein, ur: 30 mg/dL — AB
SPECIFIC GRAVITY, URINE: 1.006 (ref 1.005–1.030)

## 2017-10-09 LAB — CBC
HCT: 38.2 % (ref 35.0–47.0)
Hemoglobin: 12.1 g/dL (ref 12.0–16.0)
MCH: 28.2 pg (ref 26.0–34.0)
MCHC: 31.5 g/dL — AB (ref 32.0–36.0)
MCV: 89.3 fL (ref 80.0–100.0)
PLATELETS: 259 10*3/uL (ref 150–440)
RBC: 4.28 MIL/uL (ref 3.80–5.20)
RDW: 15.3 % — ABNORMAL HIGH (ref 11.5–14.5)
WBC: 9 10*3/uL (ref 3.6–11.0)

## 2017-10-09 LAB — BASIC METABOLIC PANEL WITH GFR
Anion gap: 13 (ref 5–15)
BUN: 15 mg/dL (ref 6–20)
CO2: 38 mmol/L — ABNORMAL HIGH (ref 22–32)
Calcium: 8.9 mg/dL (ref 8.9–10.3)
Chloride: 86 mmol/L — ABNORMAL LOW (ref 101–111)
Creatinine, Ser: 0.99 mg/dL (ref 0.44–1.00)
GFR calc Af Amer: >60 mL/min
GFR calc non Af Amer: 54 mL/min — ABNORMAL LOW
Glucose, Bld: 189 mg/dL — ABNORMAL HIGH (ref 65–99)
Potassium: 3.7 mmol/L (ref 3.5–5.1)
Sodium: 137 mmol/L (ref 135–145)

## 2017-10-09 LAB — GLUCOSE, CAPILLARY: Glucose-Capillary: 207 mg/dL — ABNORMAL HIGH (ref 65–99)

## 2017-10-09 LAB — TROPONIN I: Troponin I: <0.03 ng/mL

## 2017-10-09 LAB — MRSA PCR SCREENING: MRSA BY PCR: POSITIVE — AB

## 2017-10-09 MED ORDER — AZITHROMYCIN 500 MG PO TABS
500.0000 mg | ORAL_TABLET | Freq: Every day | ORAL | Status: DC
  Administered 2017-10-10 – 2017-10-11 (×2): 500 mg via ORAL
  Filled 2017-10-09 (×2): qty 1

## 2017-10-09 MED ORDER — INSULIN ASPART 100 UNIT/ML ~~LOC~~ SOLN
0.0000 [IU] | Freq: Every day | SUBCUTANEOUS | Status: DC
  Administered 2017-10-09 – 2017-10-10 (×2): 2 [IU] via SUBCUTANEOUS
  Filled 2017-10-09 (×2): qty 1

## 2017-10-09 MED ORDER — ROSUVASTATIN CALCIUM 10 MG PO TABS
40.0000 mg | ORAL_TABLET | Freq: Every day | ORAL | Status: DC
  Administered 2017-10-10 – 2017-10-11 (×2): 40 mg via ORAL
  Filled 2017-10-09 (×2): qty 4

## 2017-10-09 MED ORDER — ONDANSETRON HCL 4 MG PO TABS: 4.0000 mg | ORAL_TABLET | Freq: Four times a day (QID) | ORAL | Status: DC | PRN

## 2017-10-09 MED ORDER — IPRATROPIUM-ALBUTEROL 0.5-2.5 (3) MG/3ML IN SOLN
3.0000 mL | Freq: Once | RESPIRATORY_TRACT | Status: AC
Start: 2017-10-09 — End: 2017-10-09
  Administered 2017-10-09: 3 mL via RESPIRATORY_TRACT
  Filled 2017-10-09: qty 3

## 2017-10-09 MED ORDER — ENOXAPARIN SODIUM 40 MG/0.4ML ~~LOC~~ SOLN
40.0000 mg | SUBCUTANEOUS | Status: DC
  Administered 2017-10-09 – 2017-10-10 (×2): 40 mg via SUBCUTANEOUS
  Filled 2017-10-09 (×2): qty 0.4

## 2017-10-09 MED ORDER — METHYLPREDNISOLONE SODIUM SUCC 125 MG IJ SOLR
60.0000 mg | Freq: Four times a day (QID) | INTRAMUSCULAR | Status: DC
  Administered 2017-10-10 – 2017-10-11 (×6): 60 mg via INTRAVENOUS
  Filled 2017-10-09 (×6): qty 2

## 2017-10-09 MED ORDER — GABAPENTIN 100 MG PO CAPS
200.0000 mg | ORAL_CAPSULE | Freq: Two times a day (BID) | ORAL | Status: DC
  Administered 2017-10-09 – 2017-10-11 (×4): 200 mg via ORAL
  Filled 2017-10-09 (×4): qty 2

## 2017-10-09 MED ORDER — CITALOPRAM HYDROBROMIDE 20 MG PO TABS: 20.0000 mg | ORAL_TABLET | Freq: Every day | ORAL | Status: DC

## 2017-10-09 MED ORDER — CHLORHEXIDINE GLUCONATE CLOTH 2 % EX PADS: 6.0000 | MEDICATED_PAD | Freq: Every day | CUTANEOUS | Status: DC

## 2017-10-09 MED ORDER — METHYLPREDNISOLONE SODIUM SUCC 125 MG IJ SOLR
125.0000 mg | INTRAMUSCULAR | Status: AC
Start: 2017-10-09 — End: 2017-10-09
  Administered 2017-10-09: 125 mg via INTRAVENOUS
  Filled 2017-10-09: qty 2

## 2017-10-09 MED ORDER — PANTOPRAZOLE SODIUM 40 MG PO TBEC
40.0000 mg | DELAYED_RELEASE_TABLET | Freq: Every day | ORAL | Status: DC
  Administered 2017-10-10 – 2017-10-11 (×2): 40 mg via ORAL
  Filled 2017-10-09 (×2): qty 1

## 2017-10-09 MED ORDER — INSULIN ASPART 100 UNIT/ML ~~LOC~~ SOLN
0.0000 [IU] | Freq: Three times a day (TID) | SUBCUTANEOUS | Status: DC
  Administered 2017-10-10: 3 [IU] via SUBCUTANEOUS
  Administered 2017-10-10: 7 [IU] via SUBCUTANEOUS
  Administered 2017-10-10: 3 [IU] via SUBCUTANEOUS
  Administered 2017-10-11: 5 [IU] via SUBCUTANEOUS
  Administered 2017-10-11: 3 [IU] via SUBCUTANEOUS
  Filled 2017-10-09 (×5): qty 1

## 2017-10-09 MED ORDER — ASPIRIN EC 81 MG PO TBEC
81.0000 mg | DELAYED_RELEASE_TABLET | Freq: Every day | ORAL | Status: DC
  Administered 2017-10-10 – 2017-10-11 (×2): 81 mg via ORAL
  Filled 2017-10-09 (×2): qty 1

## 2017-10-09 MED ORDER — MUPIROCIN 2 % EX OINT
1.0000 | TOPICAL_OINTMENT | Freq: Two times a day (BID) | CUTANEOUS | Status: DC
Start: 2017-10-10 — End: 2017-10-11
  Administered 2017-10-10 (×2): 1 via NASAL
  Filled 2017-10-09: qty 22

## 2017-10-09 MED ORDER — ACETAMINOPHEN 325 MG PO TABS: 650.0000 mg | ORAL_TABLET | Freq: Four times a day (QID) | ORAL | Status: DC | PRN

## 2017-10-09 MED ORDER — IPRATROPIUM-ALBUTEROL 0.5-2.5 (3) MG/3ML IN SOLN: 3.0000 mL | RESPIRATORY_TRACT | Status: DC | PRN

## 2017-10-09 MED ORDER — METOPROLOL TARTRATE 25 MG PO TABS
12.5000 mg | ORAL_TABLET | Freq: Two times a day (BID) | ORAL | Status: DC
  Administered 2017-10-10 – 2017-10-11 (×3): 12.5 mg via ORAL
  Filled 2017-10-09 (×3): qty 1

## 2017-10-09 MED ORDER — TIOTROPIUM BROMIDE MONOHYDRATE 18 MCG IN CAPS
18.0000 ug | ORAL_CAPSULE | Freq: Every day | RESPIRATORY_TRACT | Status: DC
  Administered 2017-10-10 – 2017-10-11 (×2): 18 ug via RESPIRATORY_TRACT
  Filled 2017-10-09: qty 5

## 2017-10-09 MED ORDER — LEVOTHYROXINE SODIUM 100 MCG PO TABS
100.0000 ug | ORAL_TABLET | Freq: Every day | ORAL | Status: DC
  Administered 2017-10-10: 100 ug via ORAL
  Filled 2017-10-09: qty 1

## 2017-10-09 MED ORDER — ACETAMINOPHEN 650 MG RE SUPP: 650.0000 mg | Freq: Four times a day (QID) | RECTAL | Status: DC | PRN

## 2017-10-09 MED ORDER — ONDANSETRON HCL 4 MG/2ML IJ SOLN: 4.0000 mg | Freq: Four times a day (QID) | INTRAMUSCULAR | Status: DC | PRN

## 2017-10-09 NOTE — H&P (Signed)
[facility] - [place] at [facility]   PATIENT NAME: Sheila Hayes    MR#:  [id number]  DATE OF BIRTH:  1940/07/27  DATE OF ADMISSION:  10/09/2017  PRIMARY CARE PHYSICIAN: Patient, No Pcp Per   REQUESTING/REFERRING PHYSICIAN: Fanny Bien, MD  CHIEF COMPLAINT:   Chief Complaint  Patient presents with  . Fall  . Near Syncope    HISTORY OF PRESENT ILLNESS:  Sheila Hayes  is a 77 y.o. female who presents with fall at home during a near syncopal event.  Patient had her head and has a hematoma, but has no other sequela from her fall.  She states she did not lose consciousness entirely.  When EMS found her she was hypoxic.  She came to the ED for evaluation was found here to be satting in the 70s to 80s on her normal 2 L of home oxygen.  Other work-up was largely within normal limits.  She was treated for COPD exacerbation, her oxygen was increased to 4 L with improvement in her O2 sats.  Hospitalist were called for admission  PAST MEDICAL HISTORY:   Past Medical History:  Diagnosis Date  . Chronic diastolic congestive heart failure (HCC)   . COPD (chronic obstructive pulmonary disease) (HCC)   . Diabetes mellitus without complication (HCC)   . Hyperlipidemia   . Hypertension   . Hypothyroidism   . Pulmonary hypertension (HCC)      PAST SURGICAL HISTORY:   Past Surgical History:  Procedure Laterality Date  . APPENDECTOMY    . CHOLECYSTECTOMY       SOCIAL HISTORY:   Social History   Tobacco Use  . Smoking status: Former Games developer  . Smokeless tobacco: Never Used  Substance Use Topics  . Alcohol use: No     FAMILY HISTORY:   Family History  Problem Relation Age of Onset  . Heart attack Mother   . Heart attack Father   . Diabetes Sister   . Hypertension Sister   . Hypertension Brother   . Diabetes Brother   . Lung cancer Brother   . Colon cancer Brother      DRUG ALLERGIES:  No Known Allergies  MEDICATIONS AT HOME:   Prior to  Admission medications   Medication Sig Start Date End Date Taking? Authorizing Provider  albuterol (PROVENTIL HFA;VENTOLIN HFA) 108 (90 Base) MCG/ACT inhaler Inhale 2 puffs into the lungs every 6 (six) hours as needed for wheezing or shortness of breath.    [provider]  aspirin EC 81 MG tablet Take 81 mg by mouth daily.    [provider]  calcium carbonate (OSCAL) 1500 (600 Ca) MG TABS tablet Take by mouth 2 (two) times daily with a meal.    [provider]  citalopram (CELEXA) 20 MG tablet Take 20 mg by mouth daily.    [provider]  docusate sodium (COLACE) 100 MG capsule Take 200 mg by mouth at bedtime.    [provider]  furosemide (LASIX) 20 MG tablet Take 20 mg by mouth daily.    [provider]  gabapentin (NEURONTIN) 100 MG capsule Take 200 mg by mouth 2 (two) times daily.    [provider]  levothyroxine (SYNTHROID, LEVOTHROID) 137 MCG tablet Take 100 mcg by mouth daily before breakfast.     [provider]  metFORMIN (GLUCOPHAGE) 500 MG tablet Take 1,000 mg by mouth 2 (two) times daily with a meal.     [provider]  metoprolol  tartrate (LOPRESSOR) 25 MG tablet Take 0.5 tablets (12.5 mg total) by mouth 2 (two) times daily. 05/20/16   Shaune Pollack, MD  nystatin (NYSTATIN) powder Apply topically 2 (two) times daily as needed. For fungal growth    [provider]  omeprazole (PRILOSEC) 20 MG capsule Take 20 mg by mouth daily.    [provider]  potassium chloride (K-DUR) 10 MEQ tablet Take 10 mEq by mouth daily.    [provider]  rosuvastatin (CRESTOR) 40 MG tablet Take 40 mg by mouth daily.    [provider]  tiotropium (SPIRIVA) 18 MCG inhalation capsule Place 18 mcg into inhaler and inhale daily.    [provider]    REVIEW OF SYSTEMS:  Review of Systems  Constitutional: Negative for chills, fever, malaise/fatigue and weight loss.  HENT: Negative  for ear pain, hearing loss and tinnitus.   Eyes: Negative for blurred vision, double vision, pain and redness.  Respiratory: Positive for shortness of breath and wheezing. Negative for cough and hemoptysis.   Cardiovascular: Negative for chest pain, palpitations, orthopnea and leg swelling.  Gastrointestinal: Negative for abdominal pain, constipation, diarrhea, nausea and vomiting.  Genitourinary: Negative for dysuria, frequency and hematuria.  Musculoskeletal: Positive for falls. Negative for back pain, joint pain and neck pain.  Skin:       No acne, rash, or lesions  Neurological: Negative for dizziness, tremors, focal weakness and weakness.  Endo/Heme/Allergies: Negative for polydipsia. Does not bruise/bleed easily.  Psychiatric/Behavioral: Negative for depression. The patient is not nervous/anxious and does not have insomnia.      VITAL SIGNS:   Vitals:   10/09/17 1924 10/09/17 1926 10/09/17 1930 10/09/17 1958  BP: 128/64  124/76   Pulse: 91 89 89   Resp: 16 17 17  (!) 21  Temp:      TempSrc:      SpO2: (!) 74% 90% 95%   Weight:       Wt Readings from Last 3 Encounters:  10/09/17 111.1 kg (245 lb)  11/14/16 111.1 kg (245 lb)  09/13/16 107 kg (236 lb)    PHYSICAL EXAMINATION:  Physical Exam  Vitals reviewed. Constitutional: She is oriented to person, place, and time. She appears well-developed and well-nourished. No distress.  HENT:  Head: Normocephalic and atraumatic.  Mouth/Throat: Oropharynx is clear and moist.  Eyes: Pupils are equal, round, and reactive to light. Conjunctivae and EOM are normal. No scleral icterus.  Neck: Normal range of motion. Neck supple. No JVD present. No thyromegaly present.  Cardiovascular: Normal rate, regular rhythm and intact distal pulses. Exam reveals no gallop and no friction rub.  No murmur heard. Respiratory: She is in respiratory distress (mild). She has wheezes (very mild, end expiratory). She has no rales.  GI: Soft. Bowel sounds  are normal. She exhibits no distension. There is no tenderness.  Musculoskeletal: Normal range of motion. She exhibits no edema.  No arthritis, no gout  Lymphadenopathy:    She has no cervical adenopathy.  Neurological: She is alert and oriented to person, place, and time. No cranial nerve deficit.  No dysarthria, no aphasia  Skin: Skin is warm and dry. No rash noted. No erythema.  Psychiatric: She has a normal mood and affect. Her behavior is normal. Judgment and thought content normal.    LABORATORY PANEL:   CBC Recent Labs  Lab 10/09/17 1925  WBC 9.0  HGB 12.1  HCT 38.2  PLT 259   ------------------------------------------------------------------------------------------------------------------  Chemistries  Recent Labs  Lab  10/09/17 1925  NA 137  K 3.7  CL 86*  CO2 38*  GLUCOSE 189*  BUN 15  CREATININE 0.99  CALCIUM 8.9   ------------------------------------------------------------------------------------------------------------------  Cardiac Enzymes Recent Labs  Lab 10/09/17 1925  TROPONINI <0.03   ------------------------------------------------------------------------------------------------------------------  RADIOLOGY:  Dg Chest 2 View  Result Date: 10/09/2017 CLINICAL DATA:  Patient coming [facility] from [place] for unwitnessed fall. Patient reports dizziness prior to fall. Patient has large hematoma to top left head. Patient c/o headache. EXAM: CHEST - 2 VIEW COMPARISON:  07/24/2016 FINDINGS: Heart size is accentuated by the portable technique. There are no focal consolidations or pleural effusions. No pulmonary edema. No pneumothorax. No acute fracture. IMPRESSION: No evidence for acute  abnormality. Electronically Signed   By: Norva Pavlov M.D.   On: 10/09/2017 19:58   Ct Head Wo Contrast  Result Date: 10/09/2017 CLINICAL DATA:  Un witnessed fall with scalp hematoma, initial encounter EXAM: CT HEAD WITHOUT CONTRAST CT CERVICAL SPINE WITHOUT  CONTRAST TECHNIQUE: Multidetector CT imaging of the head and cervical spine was performed following the standard protocol without intravenous contrast. Multiplanar CT image reconstructions of the cervical spine were also generated. COMPARISON:  11/14/2016 FINDINGS: CT HEAD FINDINGS Brain: Mild atrophic changes are noted. No findings to suggest acute hemorrhage, acute infarction or space-occupying mass lesion are noted. Vascular: No hyperdense vessel or unexpected calcification. Skull: Normal. Negative for fracture or focal lesion. Sinuses/Orbits: No acute finding. Other: Left scalp hematoma is noted in the frontal region near the vertex. This is consistent with the given clinical history. CT CERVICAL SPINE FINDINGS Alignment: Mild straightening of the normal cervical lordosis is noted stable from the previous exam. Skull base and vertebrae: 7 cervical segments are well visualized. Some motion artifact is identified. Multilevel facet hypertrophic changes are noted. No findings to suggest acute fracture or acute facet abnormality are seen. 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 Soft tissues and spinal canal: No prevertebral fluid or swelling. No visible canal hematoma. Upper chest: Negative. Other: None IMPRESSION: CT of the head: Chronic atrophic changes. No acute intracranial abnormality is noted. Left scalp hematoma consistent with the history. CT of the cervical spine: Degenerative change without acute abnormality. Electronically Signed   By: Alcide Clever M.D.   On: 10/09/2017 20:10   Ct Cervical Spine Wo Contrast  Result Date: 10/09/2017 CLINICAL DATA:  Un witnessed fall with scalp hematoma, initial encounter EXAM: CT HEAD WITHOUT CONTRAST CT CERVICAL SPINE WITHOUT CONTRAST TECHNIQUE: Multidetector CT imaging of the head and cervical spine was performed following the standard protocol without intravenous contrast. Multiplanar CT image reconstructions of the cervical spine were also generated.  COMPARISON:  11/14/2016 FINDINGS: CT HEAD FINDINGS Brain: Mild atrophic changes are noted. No findings to suggest acute hemorrhage, acute infarction or space-occupying mass lesion are noted. Vascular: No hyperdense vessel or unexpected calcification. Skull: Normal. Negative for fracture or focal lesion. Sinuses/Orbits: No acute finding. Other: Left scalp hematoma is noted in the frontal region near the vertex. This is consistent with the given clinical history. CT CERVICAL SPINE FINDINGS Alignment: Mild straightening of the normal cervical lordosis is noted stable from the previous exam. Skull base and vertebrae: 7 cervical segments are well visualized. Some motion artifact is identified. Multilevel facet hypertrophic changes are noted. No findings to suggest acute fracture or acute facet abnormality are seen. 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 Soft tissues and spinal canal: No prevertebral fluid or swelling. No visible canal hematoma. Upper chest: Negative. Other: None IMPRESSION:  CT of the head: Chronic atrophic changes. No acute intracranial abnormality is noted. Left scalp hematoma consistent with the history. CT of the cervical spine: Degenerative change without acute abnormality. Electronically Signed   By: Alcide CleverMark  Lukens M.D.   On: 10/09/2017 20:10    EKG:   Orders placed or performed during the hospital encounter of 10/09/17  . ED EKG  . ED EKG  . EKG 12-Lead  . EKG 12-Lead    IMPRESSION AND PLAN:  Principal Problem:   COPD exacerbation (HCC) -IV steroids, duo nebs, azithromycin, other supportive treatment PRN Active Problems:   Near syncope -likely related to her hypoxia which is secondary to her COPD exacerbation.  Initial cardiac work-up in the ED is largely within normal limits/unchanged.  We will check at least one more troponin tonight, and repeat her echocardiogram in the morning.  She has not had an echocardiogram for several years, and as long as her second troponin  remains normal we will not check any further values after that.   HTN (hypertension) -hold home antihypertensives for now as the patient's blood pressure is normotensive and borderline low.  Will encourage p.o. intake, but avoid IV hydration for now given the patient's history of CHF   Diabetes (HCC) -sliding scale insulin with corresponding glucose checks   Chronic diastolic CHF (congestive heart failure) (HCC) -no active sign of exacerbation of CHF at this time, continue home meds, hold diuretic for now though as above   HLD (hyperlipidemia) -Home dose antilipid   Hypothyroidism -home dose thyroid replacement  Chart review performed and case discussed with ED provider. Labs, imaging and/or ECG reviewed by provider and discussed with patient/family. Management plans discussed with the patient and/or family.  DVT PROPHYLAXIS: SubQ lovenox  GI PROPHYLAXIS: PPI  ADMISSION STATUS: Observation  CODE STATUS: Full Code Status History    Date Active Date Inactive Code Status Order ID Comments User Context   05/13/2016 1117 05/20/2016 2022 Full Code [id number]  Eugenie NorrieBlakeney, Dana G, NP ED      TOTAL TIME TAKING CARE OF THIS PATIENT: 40 minutes.   Delma Drone FIELDING 10/09/2017, 9:01 PM  [facility]  Office  [phone]  CC: Primary care physician; Patient, No Pcp Per  Note:  This document was prepared using Dragon voice recognition software and may include unintentional dictation errors.

## 2017-10-09 NOTE — ED Notes (Signed)
Transport to floor 1A-rm 150.AS

## 2017-10-09 NOTE — ED Notes (Signed)
Patient assisted to toilet while on Oxygen via [place] at 4L. Patient's oxygen saturation decreased to 78% on 4L. Patient placed back in bed, and oxygen increased to 6L . MD Willis at bedside. Patient's oxygen saturation increased to 94% on 6L. RN will continue to monitor.

## 2017-10-09 NOTE — ED Triage Notes (Signed)
[facility]. Patient reports dizziness prior to fall. Patient has large hematoma to top left head. Patient c/o headache.

## 2017-10-09 NOTE — ED Notes (Signed)
MD Decreased patient's oxygen saturation to 2L [place] to assess patient at baseline. Patient's oxygen saturation decreased to 89% on 2L. MD ordered patient's oxygen saturation to be increased to 4L [place]. Patient's oxygen saturation increased to 92% on 4L. RN will continue to monitor.

## 2017-10-09 NOTE — ED Provider Notes (Signed)
[facility] Emergency Department Provider Note   ____________________________________________   First MD Initiated Contact with Patient 10/09/17 [phone]     (approximate)  I have reviewed the triage vital signs and the nursing notes.   HISTORY  Chief Complaint Fall and Near Syncope    HPI Sheila Hayes is a 77 y.o. female with a history of COPD, hypertension  Patient reports that she ate dinner tonight at the dining hall.  She got up and began walking shortly thereafter began feeling very lightheaded.  She said she then fell onto her left side without loss of consciousness.  She struck her head and has bruising and some tenderness with about a 8 out of 10 soreness over the left side of the scalp.  Denies other symptoms, denies recent illness.  Some slight wheezing.  No nausea or vomiting.  No neck pain no numbness tingling or weakness.  No chest pain.   Past Medical History:  Diagnosis Date  . Chronic diastolic congestive heart failure (HCC)   . COPD (chronic obstructive pulmonary disease) (HCC)   . Diabetes mellitus without complication (HCC)   . Hyperlipidemia   . Hypertension   . Hypothyroidism   . Pulmonary hypertension [facility])     Patient Active Problem List   Diagnosis Date Noted  . Near syncope 10/09/2017  . HTN (hypertension) 10/09/2017  . Diabetes (HCC) 10/09/2017  . HLD (hyperlipidemia) 10/09/2017  . Chronic diastolic CHF (congestive heart failure) (HCC) 10/09/2017  . Hypothyroidism 10/09/2017  . COPD exacerbation (HCC) 05/13/2016    Past Surgical History:  Procedure Laterality Date  . APPENDECTOMY    . CHOLECYSTECTOMY      Prior to Admission medications   Medication Sig Start Date End Date Taking? Authorizing Provider  albuterol (PROVENTIL HFA;VENTOLIN HFA) 108 (90 Base) MCG/ACT inhaler Inhale 2 puffs into the lungs every 6 (six) hours as needed for wheezing or shortness of breath.    [provider]  calcium  carbonate (OSCAL) 1500 (600 Ca) MG TABS tablet Take by mouth 2 (two) times daily with a meal.    [provider]  docusate sodium (COLACE) 100 MG capsule Take 200 mg by mouth at bedtime.    [provider]  furosemide (LASIX) 20 MG tablet Take 20 mg by mouth daily.    [provider]  gabapentin (NEURONTIN) 100 MG capsule Take 200 mg by mouth 2 (two) times daily.    [provider]  metFORMIN (GLUCOPHAGE) 500 MG tablet Take 1,000 mg by mouth 2 (two) times daily with a meal.     [provider]  metoprolol tartrate (LOPRESSOR) 25 MG tablet Take 0.5 tablets (12.5 mg total) by mouth 2 (two) times daily. 05/20/16   Shaune Pollack, MD  nystatin (NYSTATIN) powder Apply topically 2 (two) times daily as needed. For fungal growth    [provider]  omeprazole (PRILOSEC) 20 MG capsule Take 20 mg by mouth daily.    [provider]  potassium chloride (K-DUR) 10 MEQ tablet Take 10 mEq by mouth daily.    [provider]  rosuvastatin (CRESTOR) 40 MG tablet Take 40 mg by mouth daily.    [provider]  tiotropium (SPIRIVA) 18 MCG inhalation capsule Place 18 mcg into inhaler and inhale daily.    [provider]    Allergies Patient has no known allergies.  Family History  Problem Relation Age of Onset  . Heart attack Mother   . Heart attack Father   .  Diabetes Sister   . Hypertension Sister   . Hypertension Brother   . Diabetes Brother   . Lung cancer Brother   . Colon cancer Brother     Social History Social History   Tobacco Use  . Smoking status: Former Games developer  . Smokeless tobacco: Never Used  Substance Use Topics  . Alcohol use: No  . Drug use: No    Review of Systems Constitutional: No fever/chills Eyes: No visual changes. ENT: No sore throat. Cardiovascular: Denies chest pain. Respiratory: Denies shortness of breath yet says she feels slightly better with increased oxygen. Gastrointestinal: No  abdominal pain.  No nausea, no vomiting.  No diarrhea.  No constipation. Genitourinary: Negative for dysuria. Musculoskeletal: Negative for back pain. Skin: Negative for rash. Neurological: Negative for  focal weakness or numbness.    ____________________________________________   PHYSICAL EXAM:  VITAL SIGNS: ED Triage Vitals  Enc Vitals Group     BP 10/09/17 1923 128/64     Pulse Rate 10/09/17 1923 94     Resp 10/09/17 1923 18     Temp 10/09/17 1923 97.8 F (36.6 C)     Temp Source 10/09/17 1923 Oral     SpO2 10/09/17 1923 (!) 77 %     Weight 10/09/17 1921 245 lb (111.1 kg)     Height --      Head Circumference --      Peak Flow --      Pain Score 10/09/17 1921 8     Pain Loc --      Pain Edu? --      Excl. in GC? --     Constitutional: Alert and oriented. Well appearing and in no acute distress. Eyes: Conjunctivae are normal. Head: Atraumatic except for a moderate sized hematoma over the left scalp without bleeding or laceration. Nose: No congestion/rhinnorhea. Mouth/Throat: Mucous membranes are moist. Neck: No stridor.   Cardiovascular: Normal rate, regular rhythm. Grossly normal heart sounds.  Good peripheral circulation. Respiratory: Normal respiratory effort.  There is moderate end expiratory wheezing throughout all lung fields.  Speaking in full clear sentences.  When oxygen is turned down to 2 L her oxygen saturation drops to about 88% at this time, was reportedly in the mid 70s.  After turning oxygen up to 4 L her saturation improved to 96% and she reports feeling improvement. Gastrointestinal: Soft and nontender. No distention. Musculoskeletal: No lower extremity tenderness nor edema. Neurologic:  Normal speech and language. No gross focal neurologic deficits are appreciated.  Skin:  Skin is warm, dry and intact. No rash noted. Psychiatric: Mood and affect are normal. Speech and behavior are normal.  ____________________________________________   LABS (all  labs ordered are listed, but only abnormal results are displayed)  Labs Reviewed  BASIC METABOLIC PANEL - Abnormal; Notable for the following components:      Result Value   Chloride 86 (*)    CO2 38 (*)    Glucose, Bld 189 (*)    GFR calc non Af Amer 54 (*)    All other components within normal limits  CBC - Abnormal; Notable for the following components:   MCHC 31.5 (*)    RDW 15.3 (*)    All other components within normal limits  TROPONIN I  URINALYSIS, COMPLETE (UACMP) WITH MICROSCOPIC   ____________________________________________  EKG  Reviewed and entered by me in [id number] cures 140 Acute disease 500 Normal sinus rhythm, right bundle branch block.  No evidence of acute ischemia  denoted.  There is some baseline wander of note the patient denies chest pain ____________________________________________  RADIOLOGY  Chest x-ray reviewed no acute findings  CT of the cervical spine and head show no acute traumatic injury except for left scalp hematoma ____________________________________________   PROCEDURES  Procedure(s) performed: None  Procedures  Critical Care performed: Yes, see critical care note(s)  CRITICAL CARE Performed by: Sharyn CreamerMark Sola Margolis   Total critical care time: 35 minutes  Critical care time was exclusive of separately billable procedures and treating other patients.  Critical care was necessary to treat or prevent imminent or life-threatening deterioration.  Critical care was time spent personally by me on the following activities: development of treatment plan with patient and/or surrogate as well as nursing, discussions with consultants, evaluation of patient's response to treatment, examination of patient, obtaining history from patient or surrogate, ordering and performing treatments and interventions, ordering and review of laboratory studies, ordering and review of radiographic studies, pulse oximetry and re-evaluation of patient's  condition.  Patient noted to have hypoxia on her baseline 2 L with saturation less than 80% on arrival.  Patient was felt to be at elevated risk for respiratory distress and collapse requiring urgent evaluation and treatment for underlying pulmonary disorder. ____________________________________________   INITIAL IMPRESSION / ASSESSMENT AND PLAN / ED COURSE  Pertinent labs & imaging results that were available during my care of the patient were reviewed by me and considered in my medical decision making (see chart for details).  Patient presents today after a near syncopal episode where she fell injuring her left scalp.  Overall no evidence of major traumatic injuries denoted, no evidence of injury to the chest abdomen neck or extremities.  CT of the head and neck reassuring.  However, the patient did report presyncopal symptoms and was found to be hypoxic.  End expiratory wheezing, known history of COPD.  No chest pain.  No pleuritic pain.  No history of pulmonary embolism.  Symptoms improving after multiple neb treatments, and we were able to reduce the patient's oxygen back to 3 L/min with saturation 95% after nebulizer therapy and Solu-Medrol.  She denies active infectious symptoms, no elevation of white count this is felt to likely represent COPD exacerbation without clear evidence support bacterial etiology.  815PM: Discussed with the patient, she does feel much improved but given her presentation with near syncope and low oxygen saturations and slight increased oxygen requirement discussed with the patient and she is agreeable to admission to hospital for further observation and care and to the hospitalist service.  Case discussed with Dr. Anne HahnWillis      ____________________________________________   FINAL CLINICAL IMPRESSION(S) / ED DIAGNOSES  Final diagnoses:  COPD exacerbation (HCC)  Hematoma of left parietal scalp, initial encounter      NEW MEDICATIONS STARTED DURING THIS  VISIT:  New Prescriptions   No medications on file     Note:  This document was prepared using Dragon voice recognition software and may include unintentional dictation errors.     Sharyn CreamerQuale, Autym Siess, MD 10/09/17 2116

## 2017-10-09 NOTE — ED Notes (Signed)
Patient on 2L [place] at baseline. Patient's oxygen saturation 77% on 2L. Patient's oxygen saturation increased to 4L [place]. Patient's oxygen increased to 4L [place]. Patient's oxygen saturation increased to 87% on 4L. Patient's oxygen increased to 5L [place]. Patient's oxyen saturation increased to 93% on 5L. RN will  Continue to monitor.

## 2017-10-09 NOTE — ED Notes (Signed)
ED Provider at bedside. 

## 2017-10-10 ENCOUNTER — Observation Stay
Admit: 2017-10-10 | Discharge: 2017-10-10 | Disposition: A | Discharge status: Home / Self Care | Payer: Medicare Other | Attending: Internal Medicine | Admitting: Internal Medicine

## 2017-10-10 DIAGNOSIS — J441 Chronic obstructive pulmonary disease with (acute) exacerbation: Secondary | ICD-10-CM | POA: Diagnosis not present

## 2017-10-10 LAB — BASIC METABOLIC PANEL
ANION GAP: 6 (ref 5–15)
BUN: 15 mg/dL (ref 6–20)
CHLORIDE: 89 mmol/L — AB (ref 101–111)
CO2: 43 mmol/L — ABNORMAL HIGH (ref 22–32)
Calcium: 8.8 mg/dL — ABNORMAL LOW (ref 8.9–10.3)
Creatinine, Ser: 0.88 mg/dL (ref 0.44–1.00)
GFR calc Af Amer: >60 mL/min (ref >60)
GFR calc non Af Amer: >60 mL/min (ref >60)
Glucose, Bld: 206 mg/dL — ABNORMAL HIGH (ref 65–99)
POTASSIUM: 4.2 mmol/L (ref 3.5–5.1)
SODIUM: 138 mmol/L (ref 135–145)

## 2017-10-10 LAB — CBC
HEMATOCRIT: 37.2 % (ref 35.0–47.0)
HEMOGLOBIN: 11.8 g/dL — AB (ref 12.0–16.0)
MCH: 27.7 pg (ref 26.0–34.0)
MCHC: 31.8 g/dL — ABNORMAL LOW (ref 32.0–36.0)
MCV: 87.1 fL (ref 80.0–100.0)
Platelets: 236 10*3/uL (ref 150–440)
RBC: 4.27 MIL/uL (ref 3.80–5.20)
RDW: 14.9 % — AB (ref 11.5–14.5)
WBC: 9.2 10*3/uL (ref 3.6–11.0)

## 2017-10-10 LAB — GLUCOSE, CAPILLARY
Glucose-Capillary: 205 mg/dL — ABNORMAL HIGH (ref 65–99)
Glucose-Capillary: 333 mg/dL — ABNORMAL HIGH (ref 65–99)
Glucose-Capillary: 212 mg/dL — ABNORMAL HIGH (ref 65–99)
Glucose-Capillary: 230 mg/dL — ABNORMAL HIGH (ref 65–99)

## 2017-10-10 LAB — ECHOCARDIOGRAM COMPLETE
HEIGHTINCHES: 67 in
Weight: 3920 oz

## 2017-10-10 LAB — TROPONIN I: Troponin I: <0.03 ng/mL (ref <0.03)

## 2017-10-10 MED ORDER — MOMETASONE FURO-FORMOTEROL FUM 100-5 MCG/ACT IN AERO
2.0000 | INHALATION_SPRAY | Freq: Two times a day (BID) | RESPIRATORY_TRACT | Status: DC
  Administered 2017-10-10 – 2017-10-11 (×3): 2 via RESPIRATORY_TRACT
  Filled 2017-10-10: qty 8.8

## 2017-10-10 MED ORDER — LEVOTHYROXINE SODIUM 25 MCG PO TABS
125.0000 ug | ORAL_TABLET | Freq: Every day | ORAL | Status: DC
  Administered 2017-10-11: 125 ug via ORAL
  Filled 2017-10-10: qty 1

## 2017-10-10 MED ORDER — POTASSIUM CHLORIDE ER 10 MEQ PO TBCR
10.0000 meq | EXTENDED_RELEASE_TABLET | Freq: Every day | ORAL | Status: DC
  Administered 2017-10-10 – 2017-10-11 (×2): 10 meq via ORAL
  Filled 2017-10-10 (×4): qty 1

## 2017-10-10 MED ORDER — PREDNISONE 10 MG (21) PO TBPK: ORAL_TABLET | ORAL | 0 refills | Status: DC

## 2017-10-10 MED ORDER — CITALOPRAM HYDROBROMIDE 20 MG PO TABS
40.0000 mg | ORAL_TABLET | Freq: Every day | ORAL | Status: DC
  Administered 2017-10-10 – 2017-10-11 (×2): 40 mg via ORAL
  Filled 2017-10-10 (×2): qty 2

## 2017-10-10 NOTE — Progress Notes (Signed)
Pt off unit for echo

## 2017-10-10 NOTE — Clinical Social Work Note (Signed)
Clinical Social Work Assessment  Patient Details  Name: Sheila Hayes MRN: [id number] Date of Birth: 02-10-1941  Date of referral:  10/10/17               Reason for consult:  Facility Placement                Permission sought to share information with:  Chartered certified accountant granted to share information::  Yes, Verbal Permission Granted  Name::        Agency::  [facility]  Relationship::     Contact Information:     Housing/Transportation Living arrangements for the past 2 months:  [place] of Information:  Patient, Medical Team, Other (Comment Required) Patient Interpreter Needed:  None Criminal Activity/Legal Involvement Pertinent to Current Situation/Hospitalization:  No - Comment as needed Significant Relationships:  Warehouse manager, Other Family Members, Siblings Lives with:  Facility Resident Do you feel safe going back to the place where you live?  Yes Need for family participation in patient care:  No (Coment)  Care giving concerns:  The CSW saw in chart review that the patient admitted from [facility] assessment / plan:  The CSW met with the patient and her niece at bedside to discuss discharge planning. The patient was alert and oriented and gave verbal consent to contact [facility] if needed. The patient has lived at the facility for the past 1 year and 4 months and would like to return when stable. The patient indicated that the facility would provide transportation. The CSW explained her role in care and advised that the patient would most likely discharge tomorrow pending medical clearance. The patient and her niece voiced that hey had no further questions.  CSW will continue to follow for discharge facilitation.  Employment status:  Retired Forensic scientist:  Information systems manager, Medicaid In [place] PT Recommendations:  Not assessed at this time Information / Referral to community resources:      Patient/Family's Response to care:  The patient and her family were pleasant and thanked the CSW.  Patient/Family's Understanding of and Emotional Response to Diagnosis, Current Treatment, and Prognosis:  The patient and her niece understand and agree with the discharge plan.  Emotional Assessment Appearance:  Appears stated age Attitude/Demeanor/Rapport:  Engaged, Gracious Affect (typically observed):  Pleasant, Stable Orientation:  Oriented to Self, Oriented to Place, Oriented to  Time, Oriented to Situation Alcohol / Substance use:  Never Used Psych involvement (Current and /or in the community):  No (Comment)  Discharge Needs  Concerns to be addressed:  Care Coordination, Discharge Planning Concerns Readmission within the last 30 days:  No Current discharge risk:  None Barriers to Discharge:  Continued Medical Work up   [employer], LCSW 10/10/2017, 11:59 AM

## 2017-10-10 NOTE — NC FL2 (Signed)
[place] MEDICAID FL2 LEVEL OF CARE SCREENING TOOL     IDENTIFICATION  Patient Name: Sheila Hayes Birthdate: 1940-11-06 Sex: female Admission Date (Current Location): 10/09/2017  [place]  [place] [id number] K Facility and Address:  [facility], [address], [place], [place] [address]      Provider Number: [id number]  Attending Physician Name and Address:  Milagros LollSudini, Srikar, MD  Relative Name and Phone Number:  Eustace Quailina Armenta (niece) [phone]    Current Level of Care: Hospital Recommended Level of Care: Assisted Living Facility Prior Approval Number:    Date Approved/Denied:   PASRR Number:    Discharge Plan: Domiciliary (Rest home)    Current Diagnoses: Patient Active Problem List   Diagnosis Date Noted  . Near syncope 10/09/2017  . HTN (hypertension) 10/09/2017  . Diabetes (HCC) 10/09/2017  . HLD (hyperlipidemia) 10/09/2017  . Chronic diastolic CHF (congestive heart failure) (HCC) 10/09/2017  . Hypothyroidism 10/09/2017  . COPD exacerbation (HCC) 05/13/2016    Orientation RESPIRATION BLADDER Height & Weight     Self, Time, Situation, Place  O2(2L o2) Continent Weight: 245 lb (111.1 kg) Height:  5\' 7"  (170.2 cm)  BEHAVIORAL SYMPTOMS/MOOD NEUROLOGICAL BOWEL NUTRITION STATUS      Continent Diet(Heart healthy/carb modified)  AMBULATORY STATUS COMMUNICATION OF NEEDS Skin   Independent Verbally Bruising(Hematoma on head)                       Personal Care Assistance Level of Assistance  Bathing, Feeding, Dressing Bathing Assistance: Independent Feeding assistance: Independent Dressing Assistance: Independent     Functional Limitations Info             SPECIAL CARE FACTORS FREQUENCY                       Contractures Contractures Info: Not present    Additional Factors Info  Code Status, Allergies, Psychotropic Code Status Info: Full Allergies Info: No known allergies Psychotropic  Info: Celexa         Current Medications (10/10/2017):  This is the current hospital active medication list Current Facility-Administered Medications  Medication Dose Route Frequency Provider Last Rate Last Dose  . acetaminophen (TYLENOL) tablet 650 mg  650 mg Oral Q6H PRN Oralia ManisWillis, David, MD       Or  . acetaminophen (TYLENOL) suppository 650 mg  650 mg Rectal Q6H PRN Oralia ManisWillis, David, MD      . aspirin EC tablet 81 mg  81 mg Oral Daily Oralia ManisWillis, David, MD   81 mg at 10/10/17 1022  . azithromycin (ZITHROMAX) tablet 500 mg  500 mg Oral Daily Oralia ManisWillis, David, MD   500 mg at 10/10/17 1023  . Chlorhexidine Gluconate Cloth 2 % PADS 6 each  6 each Topical Q0600 Oralia ManisWillis, David, MD      . citalopram (CELEXA) tablet 40 mg  40 mg Oral Daily Milagros LollSudini, Srikar, MD   40 mg at 10/10/17 1024  . enoxaparin (LOVENOX) injection 40 mg  40 mg Subcutaneous Q24H Oralia ManisWillis, David, MD   40 mg at 10/09/17 2258  . gabapentin (NEURONTIN) capsule 200 mg  200 mg Oral BID Oralia ManisWillis, David, MD   200 mg at 10/10/17 1023  . insulin aspart (novoLOG) injection 0-5 Units  0-5 Units Subcutaneous QHS Oralia ManisWillis, David, MD   2 Units at 10/09/17 2258  . insulin aspart (novoLOG) injection 0-9 Units  0-9 Units Subcutaneous TID WC Oralia ManisWillis, David, MD   3 Units at  10/10/17 0859  . ipratropium-albuterol (DUONEB) 0.5-2.5 (3) MG/3ML nebulizer solution 3 mL  3 mL Nebulization Q4H PRN Oralia Manis, MD      . levothyroxine (SYNTHROID, LEVOTHROID) tablet 125 mcg  125 mcg Oral QAC breakfast Sudini, Srikar, MD      . methylPREDNISolone sodium succinate (SOLU-MEDROL) 125 mg/2 mL injection 60 mg  60 mg Intravenous Q6H Oralia Manis, MD   60 mg at 10/10/17 0843  . metoprolol tartrate (LOPRESSOR) tablet 12.5 mg  12.5 mg Oral BID Oralia Manis, MD   12.5 mg at 10/10/17 1022  . mometasone-formoterol (DULERA) 100-5 MCG/ACT inhaler 2 puff  2 puff Inhalation BID Milagros Loll, MD   2 puff at 10/10/17 1024  . mupirocin ointment (BACTROBAN) 2 % 1 application  1 application  Nasal BID Oralia Manis, MD   1 application at 10/10/17 1027  . ondansetron (ZOFRAN) tablet 4 mg  4 mg Oral Q6H PRN Oralia Manis, MD       Or  . ondansetron [facility]) injection 4 mg  4 mg Intravenous Q6H PRN Oralia Manis, MD      . pantoprazole (PROTONIX) EC tablet 40 mg  40 mg Oral Daily Oralia Manis, MD   40 mg at 10/10/17 1020  . potassium chloride (K-DUR) CR tablet 10 mEq  10 mEq Oral Daily Milagros Loll, MD   10 mEq at 10/10/17 1023  . rosuvastatin (CRESTOR) tablet 40 mg  40 mg Oral Daily Oralia Manis, MD   40 mg at 10/10/17 1020  . tiotropium (SPIRIVA) inhalation capsule 18 mcg  18 mcg Inhalation Daily Oralia Manis, MD   18 mcg at 10/10/17 0845     Discharge Medications: Please see discharge summary for a list of discharge medications.  Relevant Imaging Results:  Relevant Lab Results:   Additional Information SS# [id number]  Judi Cong, LCSW

## 2017-10-10 NOTE — Care Management Obs Status (Signed)
MEDICARE OBSERVATION STATUS NOTIFICATION   Patient Details  Name: Sheila ModenaSarah F Val MRN: [id number] Date of Birth: 02-09-1941   Medicare Observation Status Notification Given:  Yes    Gwenette GreetBrenda S Onnika Siebel, RN 10/10/2017, 11:34 AM

## 2017-10-10 NOTE — Discharge Instructions (Signed)
Resume diet and activity as before ° ° °

## 2017-10-11 DIAGNOSIS — J441 Chronic obstructive pulmonary disease with (acute) exacerbation: Secondary | ICD-10-CM | POA: Diagnosis not present

## 2017-10-11 LAB — GLUCOSE, CAPILLARY
GLUCOSE-CAPILLARY: 293 mg/dL — AB (ref 65–99)
Glucose-Capillary: 235 mg/dL — ABNORMAL HIGH (ref 65–99)

## 2017-10-11 NOTE — Progress Notes (Signed)
[facility] - [place] [facility]   PATIENT NAME: Sheila MediateSarah Hayes    MR#:  [id number]  DATE OF BIRTH:  1940/11/02  SUBJECTIVE:  CHIEF COMPLAINT:   Chief Complaint  Patient presents with  . Fall  . Near Syncope   Still has SOB and wheezing  REVIEW OF SYSTEMS:    Review of Systems  Constitutional: Positive for malaise/fatigue. Negative for chills and fever.  HENT: Negative for sore throat.   Eyes: Negative for blurred vision, double vision and pain.  Respiratory: Positive for cough and wheezing. Negative for hemoptysis and shortness of breath.   Cardiovascular: Negative for chest pain, palpitations, orthopnea and leg swelling.  Gastrointestinal: Negative for abdominal pain, constipation, diarrhea, heartburn, nausea and vomiting.  Genitourinary: Negative for dysuria and hematuria.  Musculoskeletal: Negative for back pain and joint pain.  Skin: Negative for rash.  Neurological: Negative for sensory change, speech change, focal weakness and headaches.  Endo/Heme/Allergies: Does not bruise/bleed easily.  Psychiatric/Behavioral: Negative for depression. The patient is not nervous/anxious.     DRUG ALLERGIES:  No Known Allergies  VITALS:  Blood pressure 124/82, pulse 68, temperature 98.7 F (37.1 C), temperature source Oral, resp. rate 18, height 5\' 7"  (1.702 m), weight 111.1 kg (245 lb), SpO2 98 %.  PHYSICAL EXAMINATION:   Physical Exam  GENERAL:  77 y.o.-year-old patient lying in the bed with no acute distress.  EYES: Pupils equal, round, reactive to light and accommodation. No scleral icterus. Extraocular muscles intact.  HEENT: Head atraumatic, normocephalic. Oropharynx and nasopharynx clear.  NECK:  Supple, no jugular venous distention. No thyroid enlargement, no tenderness.  LUNGS: Bilateral wheezing and decreased air entry CARDIOVASCULAR: S1, S2 normal. No murmurs, rubs, or gallops.  ABDOMEN: Soft, nontender, nondistended. Bowel sounds present. No  organomegaly or mass.  EXTREMITIES: No cyanosis, clubbing or edema b/l.    NEUROLOGIC: Cranial nerves II through XII are intact. No focal Motor or sensory deficits b/l.   PSYCHIATRIC: The patient is alert and oriented x 3.  SKIN: No obvious rash, lesion, or ulcer.   LABORATORY PANEL:   CBC Recent Labs  Lab 10/10/17 0228  WBC 9.2  HGB 11.8*  HCT 37.2  PLT 236   ------------------------------------------------------------------------------------------------------------------ Chemistries  Recent Labs  Lab 10/10/17 0228  NA 138  K 4.2  CL 89*  CO2 43*  GLUCOSE 206*  BUN 15  CREATININE 0.88  CALCIUM 8.8*   ------------------------------------------------------------------------------------------------------------------  Cardiac Enzymes Recent Labs  Lab 10/10/17 0228  TROPONINI <0.03   ------------------------------------------------------------------------------------------------------------------  RADIOLOGY:  Dg Chest 2 View  Result Date: 10/09/2017 CLINICAL DATA:  [facility]itnessed [facility]. Patient reports dizziness prior to fall. Patient has large hematoma to top left head. Patient c/o headache. EXAM: CHEST - 2 VIEW COMPARISON:  07/24/2016 FINDINGS: Heart size is accentuated by the portable technique. There are no focal consolidations or pleural effusions. No pulmonary edema. No pneumothorax. No acute fracture. IMPRESSION: No evidence for acute  abnormality. Electronically Signed   By: Norva PavlovElizabeth  Brown M.D.   On: 10/09/2017 19:58   Ct Head Wo Contrast  Result Date: 10/09/2017 CLINICAL DATA:  Un witnessed fall with scalp hematoma, initial encounter EXAM: CT HEAD WITHOUT CONTRAST CT CERVICAL SPINE WITHOUT CONTRAST TECHNIQUE: Multidetector CT imaging of the head and cervical spine was performed following the standard protocol without intravenous contrast. Multiplanar CT image reconstructions of the cervical spine were also generated.  COMPARISON:  11/14/2016 FINDINGS: CT HEAD FINDINGS Brain: Mild atrophic changes are noted. No  findings to suggest acute hemorrhage, acute infarction or space-occupying mass lesion are noted. Vascular: No hyperdense vessel or unexpected calcification. Skull: Normal. Negative for fracture or focal lesion. Sinuses/Orbits: No acute finding. Other: Left scalp hematoma is noted in the frontal region near the vertex. This is consistent with the given clinical history. CT CERVICAL SPINE FINDINGS Alignment: Mild straightening of the normal cervical lordosis is noted stable from the previous exam. Skull base and vertebrae: 7 cervical segments are well visualized. Some motion artifact is identified. Multilevel facet hypertrophic changes are noted. No findings to suggest acute fracture or acute facet abnormality are seen. 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 Soft tissues and spinal canal: No prevertebral fluid or swelling. No visible canal hematoma. Upper chest: Negative. Other: None IMPRESSION: CT of the head: Chronic atrophic changes. No acute intracranial abnormality is noted. Left scalp hematoma consistent with the history. CT of the cervical spine: Degenerative change without acute abnormality. Electronically Signed   By: Alcide Clever M.D.   On: 10/09/2017 20:10   Ct Cervical Spine Wo Contrast  Result Date: 10/09/2017 CLINICAL DATA:  Un witnessed fall with scalp hematoma, initial encounter EXAM: CT HEAD WITHOUT CONTRAST CT CERVICAL SPINE WITHOUT CONTRAST TECHNIQUE: Multidetector CT imaging of the head and cervical spine was performed following the standard protocol without intravenous contrast. Multiplanar CT image reconstructions of the cervical spine were also generated. COMPARISON:  11/14/2016 FINDINGS: CT HEAD FINDINGS Brain: Mild atrophic changes are noted. No findings to suggest acute hemorrhage, acute infarction or space-occupying mass lesion are noted. Vascular: No hyperdense vessel or  unexpected calcification. Skull: Normal. Negative for fracture or focal lesion. Sinuses/Orbits: No acute finding. Other: Left scalp hematoma is noted in the frontal region near the vertex. This is consistent with the given clinical history. CT CERVICAL SPINE FINDINGS Alignment: Mild straightening of the normal cervical lordosis is noted stable from the previous exam. Skull base and vertebrae: 7 cervical segments are well visualized. Some motion artifact is identified. Multilevel facet hypertrophic changes are noted. No findings to suggest acute fracture or acute facet abnormality are seen. 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 Soft tissues and spinal canal: No prevertebral fluid or swelling. No visible canal hematoma. Upper chest: Negative. Other: None IMPRESSION: CT of the head: Chronic atrophic changes. No acute intracranial abnormality is noted. Left scalp hematoma consistent with the history. CT of the cervical spine: Degenerative change without acute abnormality. Electronically Signed   By: Alcide Clever M.D.   On: 10/09/2017 20:10     ASSESSMENT AND PLAN:   * near syncope due to hypoxia No arrhythmias on tele. Echo pending Troponin normal  * Acute COPD exacerbation with acute on chronic hypoxic resp failure  - on 4 L o2 On IV steroids and nebs. Wean O2 as tolerated  * HTN Cot[place]ue home meds  * Diabetes mellitus SSI  All the records are reviewed and case discussed with Care Management/Social Worker Management plans discussed with the patient, family and they are in agreement.  CODE STATUS: FULL CODE  DVT Prophylaxis: SCDs  TOTAL TIME TAKING CARE OF THIS PATIENT: 35 minutes.   POSSIBLE D/C IN 1-2 DAYS, DEPENDING ON CLINICAL CONDITION.  Molinda Bailiff Saturnino Liew M.D on 10/11/2017 at 12:13 PM  Between 7am to 6pm - Pager - [phone]  After 6pm go to [email or web address] - password [email or web address] [facility]  [facility]  Office  [phone]  CC: Primary care physician;  Patient, No Pcp Per  Note: This dictation was prepared  with Dragon dictation along with smaller phrase technology. Any transcriptional errors that result from this process are unintentional.

## 2017-10-11 NOTE — NC FL2 (Signed)
[place] MEDICAID FL2 LEVEL OF CARE SCREENING TOOL     IDENTIFICATION  Patient Name: Sheila Hayes Birthdate: September 25, 1940 Sex: female Admission Date (Current Location): 10/09/2017  [place] Number:  [place] [id number] K Facility and Address:  [facility], [address] [place], [place] [address]      Provider Number: [id number]  Attending Physician Name and Address:  Milagros Loll, MD  Relative Name and Phone Number:  Eustace Quail (niece) [phone]    Current Level of Care: Hospital Recommended Level of Care: Assisted Living Facility Prior Approval Number:    Date Approved/Denied:   PASRR Number:  [id number]  Discharge Plan: Domiciliary (Rest home)    Current Diagnoses: Patient Active Problem List   Diagnosis Date Noted  . Near syncope 10/09/2017  . HTN (hypertension) 10/09/2017  . Diabetes (HCC) 10/09/2017  . HLD (hyperlipidemia) 10/09/2017  . Chronic diastolic CHF (congestive heart failure) (HCC) 10/09/2017  . Hypothyroidism 10/09/2017  . COPD exacerbation (HCC) 05/13/2016    Orientation RESPIRATION BLADDER Height & Weight     Self, Time, Situation, Place  O2(2L o2) Continent Weight: 245 lb (111.1 kg) Height:  5\' 7"  (170.2 cm)  BEHAVIORAL SYMPTOMS/MOOD NEUROLOGICAL BOWEL NUTRITION STATUS      Continent Diet(Heart healthy/carb modified)  AMBULATORY STATUS COMMUNICATION OF NEEDS Skin   Independent Verbally Bruising(Hematoma on head)                       Personal Care Assistance Level of Assistance  Bathing, Feeding, Dressing Bathing Assistance: Independent Feeding assistance: Independent Dressing Assistance: Independent     Functional Limitations Info             SPECIAL CARE FACTORS FREQUENCY   PT home health 2-3                     Contractures Contractures Info: Not present    Additional Factors Info  Code Status, Allergies, Psychotropic Code Status Info: Full Allergies Info: No  known allergies Psychotropic Info: Celexa        Discharge Medications: Please see discharge summary for a list of discharge medications. Medication List    TAKE these medications   albuterol 108 (90 Base) MCG/ACT inhaler Commonly known as:  PROVENTIL HFA;VENTOLIN HFA Inhale 2 puffs into the lungs every 6 (six) hours as needed for wheezing or shortness of breath.   budesonide-formoterol 80-4.5 MCG/ACT inhaler Commonly known as:  SYMBICORT Inhale 2 puffs into the lungs 2 (two) times daily.   calcium carbonate 1500 (600 Ca) MG Tabs tablet Commonly known as:  OSCAL Take by mouth 2 (two) times daily with a meal.   citalopram 40 MG tablet Commonly known as:  CELEXA Take 40 mg by mouth daily.   docusate sodium 100 MG capsule Commonly known as:  COLACE Take 200 mg by mouth at bedtime.   furosemide 20 MG tablet Commonly known as:  LASIX Take 20 mg by mouth daily.   gabapentin 100 MG capsule Commonly known as:  NEURONTIN Take 200 mg by mouth 2 (two) times daily.   glipiZIDE 10 MG tablet Commonly known as:  GLUCOTROL Take 10 mg by mouth daily before breakfast.   levothyroxine 125 MCG tablet Commonly known as:  SYNTHROID, LEVOTHROID Take 125 mcg by mouth daily before breakfast.   metFORMIN 500 MG tablet Commonly known as:  GLUCOPHAGE Take 1,000 mg by mouth 2 (two) times daily with a meal.   metoprolol tartrate 25 MG tablet Commonly  known as:  LOPRESSOR Take 0.5 tablets (12.5 mg total) by mouth 2 (two) times daily.   nystatin powder Generic drug:  nystatin Apply topically 2 (two) times daily as needed. For fungal growth   omeprazole 20 MG capsule Commonly known as:  PRILOSEC Take 20 mg by mouth daily.   potassium chloride 10 MEQ tablet Commonly known as:  K-DUR Take 10 mEq by mouth daily.   predniSONE 10 MG (21) Tbpk tablet Commonly known as:  STERAPRED UNI-PAK 21 TAB 6 tabs day 1 and taper 10 mg a day - 6 days   rosuvastatin 40 MG  tablet Commonly known as:  CRESTOR Take 40 mg by mouth daily.   tiotropium 18 MCG inhalation capsule Commonly known as:  SPIRIVA Place 18 mcg into inhaler and inhale daily.  Relevant Imaging Results: Relevant Lab Results: Additional Information SS# [id number]  Sheila Hayes, Darleen CrockerBailey M, [place]

## 2017-10-11 NOTE — Progress Notes (Signed)
Patient is medically stable for D[facility]. RN will call report and [facility] will transport and bring portable oxygen tank. Clinical Child psychotherapistocial Worker (CSW) sent D/C summary and FL2 to [facility]. Per Misty StanleyLisa resident care coordinator at [facility] patient can return today. Patient is aware of above. CSW contacted patient's sister Elease Hashimotoatricia and made her aware of above. Please reconsult if future social work needs arise. CSW signing off.   Baker Hughes IncorporatedBailey Jaeden [place], LCSW [phone]

## 2017-10-11 NOTE — Care Management Note (Signed)
Case Management Note  Patient Details  Name: Melina ModenaSarah F Quaranta MRN: [id number] Date of Birth: [birth year]  Subjective/Objective:  Referral received from CSW. Patient [facility] today. Will need HHPT. [facility]. No further needs identified.                   Action/Plan: [facility]  For HHPT.  Expected Discharge Date:  10/11/17               Expected Discharge Plan:  Home w Home Health Services  In-House Referral:     Discharge planning Services  CM Consult  Post Acute Care Choice:  Home Health Choice offered to:  Patient  DME Arranged:    DME Agency:     HH Arranged:  PT HH Agency:  [facility]  Status of Service:  Completed, signed off  If discussed at Long Length of Stay Meetings, dates discussed:    Additional Comments:  Marily MemosLisa M Manahil Vanzile, RN 10/11/2017, 12:19 PM

## 2017-10-11 NOTE — Discharge Summary (Signed)
[facility] - [place] at [facility]   PATIENT NAME: Sheila MediateSarah Hayes    MR#:  [id number]  DATE OF BIRTH:  12-06-1940  DATE OF ADMISSION:  10/09/2017 ADMITTING PHYSICIAN: Oralia Manisavid Willis, MD  DATE OF DISCHARGE: 10/11/2017  PRIMARY CARE PHYSICIAN: Patient, No Pcp Per   ADMISSION DIAGNOSIS:  COPD exacerbation (HCC) [J44.1] Hematoma of left parietal scalp, initial encounter [S00.03XA]  DISCHARGE DIAGNOSIS:  Principal Problem:   COPD exacerbation (HCC) Active Problems:   Near syncope   HTN (hypertension)   Diabetes (HCC)   HLD (hyperlipidemia)   Chronic diastolic CHF (congestive heart failure) (HCC)   Hypothyroidism   SECONDARY DIAGNOSIS:   Past Medical History:  Diagnosis Date  . Chronic diastolic congestive heart failure (HCC)   . COPD (chronic obstructive pulmonary disease) (HCC)   . Diabetes mellitus without complication (HCC)   . Hyperlipidemia   . Hypertension   . Hypothyroidism   . Pulmonary hypertension (HCC)      ADMITTING HISTORY  HISTORY OF PRESENT ILLNESS:  Sheila Hayes  is a 77 y.o. female who presents with fall at home during a near syncopal event.  Patient had her head and has a hematoma, but has no other sequela from her fall.  She states she did not lose consciousness entirely.  When EMS found her she was hypoxic.  She came to the ED for evaluation was found here to be satting in the 70s to 80s on her normal 2 L of home oxygen.  Other work-up was largely within normal limits.  She was treated for COPD exacerbation, her oxygen was increased to 4 L with improvement in her O2 sats.  Hospitalist were called for admission  HOSPITAL COURSE:   * Near syncope due to hypoxia No arrhythmias on tele. Echo with normal EF. Troponin normal.  * Acute COPD exacerbation with acute on chronic hypoxic resp failure  - on 4 L o2 On IV steroids and nebs. Weaned O2 down to 2 L/min  * HTN Cotninue home meds  * Diabetes mellitus SSI  Stable for  discharge back to ALF.  CONSULTS OBTAINED:    DRUG ALLERGIES:  No Known Allergies  DISCHARGE MEDICATIONS:   Allergies as of 10/11/2017   No Known Allergies     Medication List    TAKE these medications   albuterol 108 (90 Base) MCG/ACT inhaler Commonly known as:  PROVENTIL HFA;VENTOLIN HFA Inhale 2 puffs into the lungs every 6 (six) hours as needed for wheezing or shortness of breath.   budesonide-formoterol 80-4.5 MCG/ACT inhaler Commonly known as:  SYMBICORT Inhale 2 puffs into the lungs 2 (two) times daily.   calcium carbonate 1500 (600 Ca) MG Tabs tablet Commonly known as:  OSCAL Take by mouth 2 (two) times daily with a meal.   citalopram 40 MG tablet Commonly known as:  CELEXA Take 40 mg by mouth daily.   docusate sodium 100 MG capsule Commonly known as:  COLACE Take 200 mg by mouth at bedtime.   furosemide 20 MG tablet Commonly known as:  LASIX Take 20 mg by mouth daily.   gabapentin 100 MG capsule Commonly known as:  NEURONTIN Take 200 mg by mouth 2 (two) times daily.   glipiZIDE 10 MG tablet Commonly known as:  GLUCOTROL Take 10 mg by mouth daily before breakfast.   levothyroxine 125 MCG tablet Commonly known as:  SYNTHROID, LEVOTHROID Take 125 mcg by mouth daily before breakfast.   metFORMIN 500 MG tablet Commonly known as:  GLUCOPHAGE Take  1,000 mg by mouth 2 (two) times daily with a meal.   metoprolol tartrate 25 MG tablet Commonly known as:  LOPRESSOR Take 0.5 tablets (12.5 mg total) by mouth 2 (two) times daily.   nystatin powder Generic drug:  nystatin Apply topically 2 (two) times daily as needed. For fungal growth   omeprazole 20 MG capsule Commonly known as:  PRILOSEC Take 20 mg by mouth daily.   potassium chloride 10 MEQ tablet Commonly known as:  K-DUR Take 10 mEq by mouth daily.   predniSONE 10 MG (21) Tbpk tablet Commonly known as:  STERAPRED UNI-PAK 21 TAB 6 tabs day 1 and taper 10 mg a day - 6 days   rosuvastatin 40 MG  tablet Commonly known as:  CRESTOR Take 40 mg by mouth daily.   tiotropium 18 MCG inhalation capsule Commonly known as:  SPIRIVA Place 18 mcg into inhaler and inhale daily.       Today   VITAL SIGNS:  Blood pressure 124/82, pulse 68, temperature 98.7 F (37.1 C), temperature source Oral, resp. rate 18, height 5\' 7"  (1.702 m), weight 111.1 kg (245 lb), SpO2 98 %.  I/O:    Intake/Output Summary (Last 24 hours) at 10/11/2017 1215 Last data filed at 10/11/2017 0335 Gross per 24 hour  Intake 120 ml  Output 2910 ml  Net -2790 ml    PHYSICAL EXAMINATION:  Physical Exam  GENERAL:  77 y.o.-year-old patient lying in the bed with no acute distress.  LUNGS: Normal breath sounds bilaterally, no wheezing, rales,rhonchi or crepitation. No use of accessory muscles of respiration.  CARDIOVASCULAR: S1, S2 normal. No murmurs, rubs, or gallops.  ABDOMEN: Soft, non-tender, non-distended. Bowel sounds present. No organomegaly or mass.  NEUROLOGIC: Moves all 4 extremities. PSYCHIATRIC: The patient is alert and oriented x 3.  SKIN: No obvious rash, lesion, or ulcer.   DATA REVIEW:   CBC Recent Labs  Lab 10/10/17 0228  WBC 9.2  HGB 11.8*  HCT 37.2  PLT 236    Chemistries  Recent Labs  Lab 10/10/17 0228  NA 138  K 4.2  CL 89*  CO2 43*  GLUCOSE 206*  BUN 15  CREATININE 0.88  CALCIUM 8.8*    Cardiac Enzymes Recent Labs  Lab 10/10/17 0228  TROPONINI <0.03    Microbiology Results  Results for orders placed or performed during the hospital encounter of 10/09/17  MRSA PCR Screening     Status: Abnormal   Collection Time: 10/09/17 10:20 PM  Result Value Ref Range Status   MRSA by PCR POSITIVE (A) NEGATIVE Final    Comment:        The GeneXpert MRSA Assay (FDA approved for NASAL specimens only), is one component of a comprehensive MRSA colonization surveillance program. It is not intended to diagnose MRSA infection nor to guide or monitor treatment for MRSA  infections. RESULT CALLED TO, READ BACK BY AND VERIFIED WITH: mary raymond at 2343 on 10/09/17 rww Performed at [facility], [address] [place] [place], [place] [address]     RADIOLOGY:  Dg Chest 2 View  Result Date: 10/09/2017 CLINICAL DATA:  Patient coming [facility] from [facility] for unwitnessed fall. Patient reports dizziness prior to fall. Patient has large hematoma to top left head. Patient c/o headache. EXAM: CHEST - 2 VIEW COMPARISON:  07/24/2016 FINDINGS: Heart size is accentuated by the portable technique. There are no focal consolidations or pleural effusions. No pulmonary edema. No pneumothorax. No acute fracture. IMPRESSION: No evidence for acute  abnormality.  Electronically Signed   By: Norva Pavlov M.D.   On: 10/09/2017 19:58   Ct Head Wo Contrast  Result Date: 10/09/2017 CLINICAL DATA:  Un witnessed fall with scalp hematoma, initial encounter EXAM: CT HEAD WITHOUT CONTRAST CT CERVICAL SPINE WITHOUT CONTRAST TECHNIQUE: Multidetector CT imaging of the head and cervical spine was performed following the standard protocol without intravenous contrast. Multiplanar CT image reconstructions of the cervical spine were also generated. COMPARISON:  11/14/2016 FINDINGS: CT HEAD FINDINGS Brain: Mild atrophic changes are noted. No findings to suggest acute hemorrhage, acute infarction or space-occupying mass lesion are noted. Vascular: No hyperdense vessel or unexpected calcification. Skull: Normal. Negative for fracture or focal lesion. Sinuses/Orbits: No acute finding. Other: Left scalp hematoma is noted in the frontal region near the vertex. This is consistent with the given clinical history. CT CERVICAL SPINE FINDINGS Alignment: Mild straightening of the normal cervical lordosis is noted stable from the previous exam. Skull base and vertebrae: 7 cervical segments are well visualized. Some motion artifact is identified. Multilevel facet hypertrophic changes are noted. No findings to  suggest acute fracture or acute facet abnormality are seen. 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 Soft tissues and spinal canal: No prevertebral fluid or swelling. No visible canal hematoma. Upper chest: Negative. Other: None IMPRESSION: CT of the head: Chronic atrophic changes. No acute intracranial abnormality is noted. Left scalp hematoma consistent with the history. CT of the cervical spine: Degenerative change without acute abnormality. Electronically Signed   By: Alcide Clever M.D.   On: 10/09/2017 20:10   Ct Cervical Spine Wo Contrast  Result Date: 10/09/2017 CLINICAL DATA:  Un witnessed fall with scalp hematoma, initial encounter EXAM: CT HEAD WITHOUT CONTRAST CT CERVICAL SPINE WITHOUT CONTRAST TECHNIQUE: Multidetector CT imaging of the head and cervical spine was performed following the standard protocol without intravenous contrast. Multiplanar CT image reconstructions of the cervical spine were also generated. COMPARISON:  11/14/2016 FINDINGS: CT HEAD FINDINGS Brain: Mild atrophic changes are noted. No findings to suggest acute hemorrhage, acute infarction or space-occupying mass lesion are noted. Vascular: No hyperdense vessel or unexpected calcification. Skull: Normal. Negative for fracture or focal lesion. Sinuses/Orbits: No acute finding. Other: Left scalp hematoma is noted in the frontal region near the vertex. This is consistent with the given clinical history. CT CERVICAL SPINE FINDINGS Alignment: Mild straightening of the normal cervical lordosis is noted stable from the previous exam. Skull base and vertebrae: 7 cervical segments are well visualized. Some motion artifact is identified. Multilevel facet hypertrophic changes are noted. No findings to suggest acute fracture or acute facet abnormality are seen. 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 Soft tissues and spinal canal: No prevertebral fluid or swelling. No visible canal hematoma. Upper chest: Negative.  Other: None IMPRESSION: CT of the head: Chronic atrophic changes. No acute intracranial abnormality is noted. Left scalp hematoma consistent with the history. CT of the cervical spine: Degenerative change without acute abnormality. Electronically Signed   By: Alcide Clever M.D.   On: 10/09/2017 20:10    Follow up with PCP in 1 week.  Management plans discussed with the patient, family and they are in agreement.  CODE STATUS:     Code Status Orders  (From admission, onward)        Start     Ordered   10/09/17 2206  Full code  Continuous     10/09/17 2205    Code Status History    Date Active Date Inactive Code  Status Order ID Comments User Context   05/13/2016 1117 05/20/2016 2022 Full Code [id number]  Eugenie Norrie, NP ED    Advance Directive Documentation     Most Recent Value  Type of Advance Directive  Healthcare Power of Attorney  Pre-existing out of facility DNR order (yellow form or pink MOST form)  -  "MOST" Form in Place?  -      TOTAL TIME TAKING CARE OF THIS PATIENT ON DAY OF DISCHARGE: more than 30 minutes.   Orie Fisherman M.D on 10/11/2017 at 12:15 PM  Between 7am to 6pm - Pager - [phone]  After 6pm go to [email or web address] - password [email or web address]  [facility]  Office  [phone]  CC: Primary care physician; Patient, No Pcp Per  Note: This dictation was prepared with Dragon dictation along with smaller phrase technology. Any transcriptional errors that result from this process are unintentional.

## 2017-10-11 NOTE — Progress Notes (Addendum)
[facility] is being [facility]. A&O x4. Report called. On 2L O2. Waiting for ride at this time. IV removed with cath intact.   [facility]. Home O2 was hooked up and patient was discharged with paperwork.

## 2017-10-11 NOTE — Progress Notes (Signed)
Clinical Child psychotherapistocial Worker (CSW) contacted [facility] resident care coordinator and made her aware of patient's admission. Per Misty StanleyLisa patient is from the ALF side and is on chronic 2 liters oxygen and is independent with her ADLs. Per Misty StanleyLisa patient can return to [facility] and requested home health PT and nursing [facility]. CSW made RN case manager aware of above. Per [facility]ales SalmonLisa [facility] can transport.   Baker Hughes IncorporatedBailey Mardel Grudzien, LCSW [phone]

## 2017-12-28 ENCOUNTER — Other Ambulatory Visit: Payer: Self-pay

## 2017-12-28 ENCOUNTER — Emergency Department: Payer: Medicare Other

## 2017-12-28 ENCOUNTER — Encounter: Payer: Self-pay | Admitting: Emergency Medicine

## 2017-12-28 ENCOUNTER — Emergency Department
Admission: EM | Admit: 2017-12-28 | Discharge: 2017-12-28 | Disposition: A | Discharge status: Home / Self Care | Payer: Medicare Other | Attending: Emergency Medicine | Admitting: Emergency Medicine

## 2017-12-28 DIAGNOSIS — E119 Type 2 diabetes mellitus without complications: Secondary | ICD-10-CM | POA: Insufficient documentation

## 2017-12-28 DIAGNOSIS — R202 Paresthesia of skin: Secondary | ICD-10-CM | POA: Diagnosis present

## 2017-12-28 DIAGNOSIS — I5032 Chronic diastolic (congestive) heart failure: Secondary | ICD-10-CM | POA: Insufficient documentation

## 2017-12-28 DIAGNOSIS — Z9049 Acquired absence of other specified parts of digestive tract: Secondary | ICD-10-CM | POA: Diagnosis not present

## 2017-12-28 DIAGNOSIS — I110 Hypertensive heart disease with heart failure: Secondary | ICD-10-CM | POA: Insufficient documentation

## 2017-12-28 DIAGNOSIS — J449 Chronic obstructive pulmonary disease, unspecified: Secondary | ICD-10-CM | POA: Diagnosis not present

## 2017-12-28 DIAGNOSIS — Z7984 Long term (current) use of oral hypoglycemic drugs: Secondary | ICD-10-CM | POA: Insufficient documentation

## 2017-12-28 DIAGNOSIS — Z87891 Personal history of nicotine dependence: Secondary | ICD-10-CM | POA: Insufficient documentation

## 2017-12-28 DIAGNOSIS — Z79899 Other long term (current) drug therapy: Secondary | ICD-10-CM | POA: Insufficient documentation

## 2017-12-28 DIAGNOSIS — E039 Hypothyroidism, unspecified: Secondary | ICD-10-CM | POA: Insufficient documentation

## 2017-12-28 LAB — COMPREHENSIVE METABOLIC PANEL
ALT: 9 U/L (ref 0–44)
AST: 16 U/L (ref 15–41)
Albumin: 3.7 g/dL (ref 3.5–5.0)
Alkaline Phosphatase: 46 U/L (ref 38–126)
Anion gap: 6 (ref 5–15)
BUN: 12 mg/dL (ref 8–23)
CO2: 44 mmol/L — ABNORMAL HIGH (ref 22–32)
Calcium: 9.5 mg/dL (ref 8.9–10.3)
Chloride: 94 mmol/L — ABNORMAL LOW (ref 98–111)
Creatinine, Ser: 0.87 mg/dL (ref 0.44–1.00)
GFR calc Af Amer: >60 mL/min (ref >60)
GFR calc non Af Amer: >60 mL/min (ref >60)
Glucose, Bld: 131 mg/dL — ABNORMAL HIGH (ref 70–99)
Potassium: 4.2 mmol/L (ref 3.5–5.1)
Sodium: 144 mmol/L (ref 135–145)
Total Bilirubin: 0.5 mg/dL (ref 0.3–1.2)
Total Protein: 6.9 g/dL (ref 6.5–8.1)

## 2017-12-28 LAB — CBC
HCT: 36.1 % (ref 35.0–47.0)
Hemoglobin: 11.8 g/dL — ABNORMAL LOW (ref 12.0–16.0)
MCH: 28.8 pg (ref 26.0–34.0)
MCHC: 32.6 g/dL (ref 32.0–36.0)
MCV: 88.4 fL (ref 80.0–100.0)
PLATELETS: 204 10*3/uL (ref 150–440)
RBC: 4.09 MIL/uL (ref 3.80–5.20)
RDW: 14.4 % (ref 11.5–14.5)
WBC: 6.2 10*3/uL (ref 3.6–11.0)

## 2017-12-28 LAB — TROPONIN I: Troponin I: <0.03 ng/mL (ref <0.03)

## 2017-12-28 NOTE — ED Provider Notes (Signed)
[facility] Emergency Department Provider Note   ____________________________________________    I have reviewed the triage vital signs and the nursing notes.   HISTORY  Chief Complaint Numbness     HPI Sheila Hayes is a 77 y.o. female who was sent to the emergency department for evaluation because she has had some tingling and numbness to her right hand over the last 3 days.  Patient reports that seems to come and go.  She denies muscle weakness.  No headache, no neck pain.  No lower extremity numbness or tingling.  No difficulty speaking.  Patient states she feels quite well.  Past Medical History:  Diagnosis Date  . Chronic diastolic congestive heart failure (HCC)   . COPD (chronic obstructive pulmonary disease) (HCC)   . Diabetes mellitus without complication (HCC)   . Hyperlipidemia   . Hypertension   . Hypothyroidism   . Pulmonary hypertension [facility])     Patient Active Problem List   Diagnosis Date Noted  . Near syncope 10/09/2017  . HTN (hypertension) 10/09/2017  . Diabetes (HCC) 10/09/2017  . HLD (hyperlipidemia) 10/09/2017  . Chronic diastolic CHF (congestive heart failure) (HCC) 10/09/2017  . Hypothyroidism 10/09/2017  . COPD exacerbation (HCC) 05/13/2016    Past Surgical History:  Procedure Laterality Date  . APPENDECTOMY    . CHOLECYSTECTOMY      Prior to Admission medications   Medication Sig Start Date End Date Taking? Authorizing Provider  albuterol (PROVENTIL HFA;VENTOLIN HFA) 108 (90 Base) MCG/ACT inhaler Inhale 2 puffs into the lungs every 6 (six) hours as needed for wheezing or shortness of breath.    [provider]  budesonide-formoterol (SYMBICORT) 80-4.5 MCG/ACT inhaler Inhale 2 puffs into the lungs 2 (two) times daily.    [provider]  calcium carbonate (OSCAL) 1500 (600 Ca) MG TABS tablet Take by mouth 2 (two) times daily with a meal.    [provider]  citalopram (CELEXA) 40  MG tablet Take 40 mg by mouth daily.    [provider]  docusate sodium (COLACE) 100 MG capsule Take 200 mg by mouth at bedtime.    [provider]  furosemide (LASIX) 20 MG tablet Take 20 mg by mouth daily.    [provider]  gabapentin (NEURONTIN) 100 MG capsule Take 200 mg by mouth 2 (two) times daily.    [provider]  glipiZIDE (GLUCOTROL) 10 MG tablet Take 10 mg by mouth daily before breakfast.    [provider]  levothyroxine (SYNTHROID, LEVOTHROID) 125 MCG tablet Take 125 mcg by mouth daily before breakfast.    [provider]  metFORMIN (GLUCOPHAGE) 500 MG tablet Take 1,000 mg by mouth 2 (two) times daily with a meal.     [provider]  metoprolol tartrate (LOPRESSOR) 25 MG tablet Take 0.5 tablets (12.5 mg total) by mouth 2 (two) times daily. 05/20/16   Shaune Pollack, MD  nystatin (NYSTATIN) powder Apply topically 2 (two) times daily as needed. For fungal growth    [provider]  omeprazole (PRILOSEC) 20 MG capsule Take 20 mg by mouth daily.    [provider]  potassium chloride (K-DUR) 10 MEQ tablet Take 10 mEq by mouth daily.    [provider]  predniSONE (STERAPRED UNI-PAK 21 TAB) 10 MG (21) TBPK tablet 6 tabs day 1 and taper 10 mg a day - 6 days 10/10/17   Milagros Loll, MD  rosuvastatin (CRESTOR) 40 MG tablet Take 40 mg  by mouth daily.    [provider]  tiotropium (SPIRIVA) 18 MCG inhalation capsule Place 18 mcg into inhaler and inhale daily.    [provider]     Allergies Patient has no known allergies.  Family History  Problem Relation Age of Onset  . Heart attack Mother   . Heart attack Father   . Diabetes Sister   . Hypertension Sister   . Hypertension Brother   . Diabetes Brother   . Lung cancer Brother   . Colon cancer Brother     Social History Social History   Tobacco Use  . Smoking status: Former Games developermoker  . Smokeless tobacco: Never Used    Substance Use Topics  . Alcohol use: No  . Drug use: No    Review of Systems  Constitutional: No fever/chills Eyes: No visual changes.  ENT: No neck pain Cardiovascular: Denies chest pain. Respiratory: Denies shortness of breath. Gastrointestinal: No abdominal pain.  No nausea, no vomiting.   Genitourinary: Negative for dysuria. Musculoskeletal: Negative for back pain. Skin: Negative for rash. Neurological: Negative for headaches, as above   ____________________________________________   PHYSICAL EXAM:  VITAL SIGNS: ED Triage Vitals  Enc Vitals Group     BP 12/28/17 1213 (!) 112/47     Pulse Rate 12/28/17 1213 74     Resp 12/28/17 1213 16     Temp 12/28/17 1213 98.1 F (36.7 C)     Temp Source 12/28/17 1213 Oral     SpO2 12/28/17 1213 94 %     Weight 12/28/17 1212 114.6 kg (252 lb 10.4 oz)     Height --      Head Circumference --      Peak Flow --      Pain Score 12/28/17 1212 0     Pain Loc --      Pain Edu? --      Excl. in GC? --     Constitutional: Alert and oriented. No acute distress. Pleasant and interactive Eyes: Conjunctivae are normal.   Nose: No congestion/rhinnorhea. Mouth/Throat: Mucous membranes are moist.    Cardiovascular: Normal rate, regular rhythm. Grossly normal heart sounds.  Good peripheral circulation. Respiratory: Normal respiratory effort.  No retractions. Lungs CTAB. Gastrointestinal: Soft and nontender. No distention.  Musculoskeletal: No lower extremity tenderness nor edema.   Neurologic:  Normal speech and language. No gross focal neurologic deficits are appreciated.  Equal strength in all extremities.  Cranial nerves II to XII are normal Skin:  Skin is warm, dry and intact. No rash noted. Psychiatric: Mood and affect are normal. Speech and behavior are normal.  ____________________________________________   LABS (all labs ordered are listed, but only abnormal results are displayed)  Labs Reviewed  CBC - Abnormal; Notable  for the following components:      Result Value   Hemoglobin 11.8 (*)    All other components within normal limits  COMPREHENSIVE METABOLIC PANEL - Abnormal; Notable for the following components:   Chloride 94 (*)    CO2 44 (*)    Glucose, Bld 131 (*)    All other components within normal limits  TROPONIN I   ____________________________________________  EKG   ____________________________________________  RADIOLOGY  CT head no acute distress ____________________________________________   PROCEDURES  Procedure(s) performed: No  Procedures   Critical Care performed: No ____________________________________________   INITIAL IMPRESSION / ASSESSMENT AND PLAN / ED COURSE  Pertinent labs & imaging results that were available during my care of the patient were  reviewed by me and considered in my medical decision making (see chart for details).  Patient well-appearing in no acute distress.  Neurologic exam appears normal, CT head unremarkable, lab work is benign.  Appropriate for discharge with outpatient follow-up    ____________________________________________   FINAL CLINICAL IMPRESSION(S) / ED DIAGNOSES  Final diagnoses:  Paresthesia        Note:  This document was prepared using Dragon voice recognition software and may include unintentional dictation errors.    Jene Every, MD 12/28/17 1505

## 2017-12-28 NOTE — ED Notes (Signed)
Waiting on [facility]  

## 2017-12-28 NOTE — ED Triage Notes (Addendum)
Pt to ED via EMS [facility]. Per  Doctors making house calls determined pt was having numbness and tingling to hands bilat x3days.  VSS, speech clear, A&OX4

## 2017-12-28 NOTE — ED Notes (Signed)
Patient's discharge and follow up information reviewed with patient by ED nursing staff and patient given the opportunity to ask questions pertaining to ED visit and discharge plan of care. Patient advised that should symptoms not continue to improve, resolve entirely, or should new symptoms develop then a follow up visit with their PCP or a return visit to the ED may be warranted. Patient verbalized consent and understanding of discharge plan of care including potential need for further evaluation. Patient discharged in stable condition per attending ED physician on duty.   [facility] returning [facility].

## 2018-01-30 ENCOUNTER — Emergency Department: Payer: Medicare Other

## 2018-01-30 ENCOUNTER — Inpatient Hospital Stay
Admission: EM | Admit: 2018-01-30 | Discharge: 2018-02-02 | DRG: 871 | Disposition: A | Discharge status: Nursing Home (Includes ICF) | Payer: Medicare Other | Attending: Internal Medicine | Admitting: Internal Medicine

## 2018-01-30 ENCOUNTER — Other Ambulatory Visit: Payer: Self-pay

## 2018-01-30 DIAGNOSIS — R4182 Altered mental status, unspecified: Secondary | ICD-10-CM | POA: Diagnosis not present

## 2018-01-30 DIAGNOSIS — E039 Hypothyroidism, unspecified: Secondary | ICD-10-CM | POA: Diagnosis present

## 2018-01-30 DIAGNOSIS — R7989 Other specified abnormal findings of blood chemistry: Secondary | ICD-10-CM

## 2018-01-30 DIAGNOSIS — B961 Klebsiella pneumoniae [K. pneumoniae] as the cause of diseases classified elsewhere: Secondary | ICD-10-CM | POA: Diagnosis present

## 2018-01-30 DIAGNOSIS — A419 Sepsis, unspecified organism: Secondary | ICD-10-CM

## 2018-01-30 DIAGNOSIS — E662 Morbid (severe) obesity with alveolar hypoventilation: Secondary | ICD-10-CM | POA: Diagnosis not present

## 2018-01-30 DIAGNOSIS — I272 Pulmonary hypertension, unspecified: Secondary | ICD-10-CM | POA: Diagnosis present

## 2018-01-30 DIAGNOSIS — E873 Alkalosis: Secondary | ICD-10-CM | POA: Diagnosis not present

## 2018-01-30 DIAGNOSIS — Z6838 Body mass index (BMI) 38.0-38.9, adult: Secondary | ICD-10-CM

## 2018-01-30 DIAGNOSIS — E785 Hyperlipidemia, unspecified: Secondary | ICD-10-CM | POA: Diagnosis present

## 2018-01-30 DIAGNOSIS — Z79899 Other long term (current) drug therapy: Secondary | ICD-10-CM | POA: Diagnosis not present

## 2018-01-30 DIAGNOSIS — E876 Hypokalemia: Secondary | ICD-10-CM | POA: Diagnosis present

## 2018-01-30 DIAGNOSIS — Z9981 Dependence on supplemental oxygen: Secondary | ICD-10-CM

## 2018-01-30 DIAGNOSIS — J9601 Acute respiratory failure with hypoxia: Secondary | ICD-10-CM | POA: Diagnosis present

## 2018-01-30 DIAGNOSIS — N390 Urinary tract infection, site not specified: Secondary | ICD-10-CM | POA: Diagnosis present

## 2018-01-30 DIAGNOSIS — I5032 Chronic diastolic (congestive) heart failure: Secondary | ICD-10-CM | POA: Diagnosis present

## 2018-01-30 DIAGNOSIS — J9622 Acute and chronic respiratory failure with hypercapnia: Secondary | ICD-10-CM | POA: Diagnosis not present

## 2018-01-30 DIAGNOSIS — A4159 Other Gram-negative sepsis: Secondary | ICD-10-CM | POA: Diagnosis present

## 2018-01-30 DIAGNOSIS — Z7984 Long term (current) use of oral hypoglycemic drugs: Secondary | ICD-10-CM | POA: Diagnosis not present

## 2018-01-30 DIAGNOSIS — Z87891 Personal history of nicotine dependence: Secondary | ICD-10-CM

## 2018-01-30 DIAGNOSIS — Z7951 Long term (current) use of inhaled steroids: Secondary | ICD-10-CM | POA: Diagnosis not present

## 2018-01-30 DIAGNOSIS — J441 Chronic obstructive pulmonary disease with (acute) exacerbation: Secondary | ICD-10-CM | POA: Diagnosis present

## 2018-01-30 DIAGNOSIS — E119 Type 2 diabetes mellitus without complications: Secondary | ICD-10-CM | POA: Diagnosis present

## 2018-01-30 DIAGNOSIS — J9602 Acute respiratory failure with hypercapnia: Secondary | ICD-10-CM | POA: Diagnosis present

## 2018-01-30 DIAGNOSIS — I110 Hypertensive heart disease with heart failure: Secondary | ICD-10-CM | POA: Diagnosis present

## 2018-01-30 DIAGNOSIS — J9621 Acute and chronic respiratory failure with hypoxia: Secondary | ICD-10-CM | POA: Diagnosis not present

## 2018-01-30 HISTORY — DX: Morbid (severe) obesity due to excess calories: E66.01

## 2018-01-30 LAB — BLOOD GAS, ARTERIAL
ACID-BASE EXCESS: 17.2 mmol/L — AB (ref 0.0–2.0)
Bicarbonate: 48.5 mmol/L — ABNORMAL HIGH (ref 20.0–28.0)
Delivery systems: POSITIVE
Expiratory PAP: 8
FIO2: 0.8
INSPIRATORY PAP: 16
O2 Saturation: 98.1 %
PCO2 ART: 108 mmHg — AB (ref 32.0–48.0)
PH ART: 7.26 — AB (ref 7.350–7.450)
Patient temperature: 37
pO2, Arterial: 119 mmHg — ABNORMAL HIGH (ref 83.0–108.0)

## 2018-01-30 LAB — COMPREHENSIVE METABOLIC PANEL
ALT: 9 U/L (ref 0–44)
ANION GAP: 9 (ref 5–15)
AST: 18 U/L (ref 15–41)
Albumin: 3.8 g/dL (ref 3.5–5.0)
Alkaline Phosphatase: 43 U/L (ref 38–126)
BUN: 18 mg/dL (ref 8–23)
CO2: 41 mmol/L — ABNORMAL HIGH (ref 22–32)
Calcium: 9 mg/dL (ref 8.9–10.3)
Chloride: 93 mmol/L — ABNORMAL LOW (ref 98–111)
Creatinine, Ser: 0.88 mg/dL (ref 0.44–1.00)
Glucose, Bld: 152 mg/dL — ABNORMAL HIGH (ref 70–99)
POTASSIUM: 4 mmol/L (ref 3.5–5.1)
Sodium: 143 mmol/L (ref 135–145)
TOTAL PROTEIN: 6.9 g/dL (ref 6.5–8.1)
Total Bilirubin: 0.6 mg/dL (ref 0.3–1.2)

## 2018-01-30 LAB — URINALYSIS, COMPLETE (UACMP) WITH MICROSCOPIC
Bilirubin Urine: NEGATIVE
Glucose, UA: NEGATIVE mg/dL
Ketones, ur: NEGATIVE mg/dL
Nitrite: POSITIVE — AB
Protein, ur: 100 mg/dL — AB
SPECIFIC GRAVITY, URINE: 1.011 (ref 1.005–1.030)
pH: 5 (ref 5.0–8.0)

## 2018-01-30 LAB — CBC WITH DIFFERENTIAL/PLATELET
BASOS ABS: 0 10*3/uL (ref 0–0.1)
Basophils Relative: 1 %
Eosinophils Absolute: 0.1 10*3/uL (ref 0–0.7)
Eosinophils Relative: 1 %
HEMATOCRIT: 35.7 % (ref 35.0–47.0)
Hemoglobin: 11.4 g/dL — ABNORMAL LOW (ref 12.0–16.0)
Lymphocytes Relative: 18 %
Lymphs Abs: 1.3 10*3/uL (ref 1.0–3.6)
MCH: 28.8 pg (ref 26.0–34.0)
MCHC: 31.9 g/dL — ABNORMAL LOW (ref 32.0–36.0)
MCV: 90.4 fL (ref 80.0–100.0)
MONO ABS: 0.5 10*3/uL (ref 0.2–0.9)
Monocytes Relative: 6 %
NEUTROS ABS: 5.4 10*3/uL (ref 1.4–6.5)
Neutrophils Relative %: 74 %
PLATELETS: 183 10*3/uL (ref 150–440)
RBC: 3.95 MIL/uL (ref 3.80–5.20)
RDW: 14.8 % — AB (ref 11.5–14.5)
WBC: 7.3 10*3/uL (ref 3.6–11.0)

## 2018-01-30 LAB — BLOOD GAS, VENOUS
ACID-BASE EXCESS: 10.6 mmol/L — AB (ref 0.0–2.0)
BICARBONATE: 41.7 mmol/L — AB (ref 20.0–28.0)
O2 Saturation: 61.7 %
PCO2 VEN: 102 mmHg — AB (ref 44.0–60.0)
PH VEN: 7.22 — AB (ref 7.250–7.430)
Patient temperature: 37
pO2, Ven: 39 mmHg (ref 32.0–45.0)

## 2018-01-30 LAB — PROTIME-INR
INR: 0.91
Prothrombin Time: 12.2 seconds (ref 11.4–15.2)

## 2018-01-30 LAB — GLUCOSE, CAPILLARY: Glucose-Capillary: 173 mg/dL — ABNORMAL HIGH (ref 70–99)

## 2018-01-30 LAB — LACTIC ACID, PLASMA: Lactic Acid, Venous: 3.2 mmol/L (ref 0.5–1.9)

## 2018-01-30 LAB — MAGNESIUM: Magnesium: 2.1 mg/dL (ref 1.7–2.4)

## 2018-01-30 LAB — BRAIN NATRIURETIC PEPTIDE: B Natriuretic Peptide: 117 pg/mL — ABNORMAL HIGH (ref 0.0–100.0)

## 2018-01-30 MED ORDER — INSULIN ASPART 100 UNIT/ML ~~LOC~~ SOLN: 0.0000 [IU] | SUBCUTANEOUS | Status: DC

## 2018-01-30 MED ORDER — IPRATROPIUM-ALBUTEROL 0.5-2.5 (3) MG/3ML IN SOLN
3.0000 mL | Freq: Once | RESPIRATORY_TRACT | Status: AC
  Administered 2018-01-30: 3 mL via RESPIRATORY_TRACT
  Filled 2018-01-30: qty 3

## 2018-01-30 MED ORDER — MOMETASONE FURO-FORMOTEROL FUM 100-5 MCG/ACT IN AERO
2.0000 | INHALATION_SPRAY | Freq: Two times a day (BID) | RESPIRATORY_TRACT | Status: DC
  Filled 2018-01-30: qty 8.8

## 2018-01-30 MED ORDER — SODIUM CHLORIDE 0.9 % IV SOLN
1.0000 g | INTRAVENOUS | Status: AC
  Administered 2018-01-30: 1 g via INTRAVENOUS
  Filled 2018-01-30: qty 10

## 2018-01-30 MED ORDER — ONDANSETRON HCL 4 MG/2ML IJ SOLN: 4.0000 mg | Freq: Four times a day (QID) | INTRAMUSCULAR | Status: DC | PRN

## 2018-01-30 MED ORDER — INSULIN ASPART 100 UNIT/ML ~~LOC~~ SOLN
0.0000 [IU] | SUBCUTANEOUS | Status: DC
  Administered 2018-01-31: 3 [IU] via SUBCUTANEOUS
  Administered 2018-01-31: 2 [IU] via SUBCUTANEOUS
  Administered 2018-01-31 (×2): 3 [IU] via SUBCUTANEOUS
  Administered 2018-01-31: 5 [IU] via SUBCUTANEOUS
  Administered 2018-01-31 (×2): 3 [IU] via SUBCUTANEOUS
  Administered 2018-02-01: 5 [IU] via SUBCUTANEOUS
  Administered 2018-02-01: 2 [IU] via SUBCUTANEOUS
  Administered 2018-02-01: 8 [IU] via SUBCUTANEOUS
  Administered 2018-02-01: 3 [IU] via SUBCUTANEOUS
  Administered 2018-02-01: 4 [IU] via SUBCUTANEOUS
  Administered 2018-02-01: 2 [IU] via SUBCUTANEOUS
  Administered 2018-02-02 (×2): 3 [IU] via SUBCUTANEOUS
  Filled 2018-01-30 (×15): qty 1

## 2018-01-30 MED ORDER — SODIUM CHLORIDE 0.9 % IV SOLN
1.0000 g | INTRAVENOUS | Status: DC
  Administered 2018-01-31 – 2018-02-01 (×2): 1 g via INTRAVENOUS
  Filled 2018-01-30: qty 10
  Filled 2018-01-30: qty 1
  Filled 2018-01-30: qty 10

## 2018-01-30 MED ORDER — IPRATROPIUM-ALBUTEROL 0.5-2.5 (3) MG/3ML IN SOLN
3.0000 mL | Freq: Four times a day (QID) | RESPIRATORY_TRACT | Status: DC
  Administered 2018-01-31 – 2018-02-02 (×10): 3 mL via RESPIRATORY_TRACT
  Filled 2018-01-30 (×3): qty 3
  Filled 2018-01-30: qty 39
  Filled 2018-01-30 (×7): qty 3

## 2018-01-30 MED ORDER — FUROSEMIDE 10 MG/ML IJ SOLN
40.0000 mg | Freq: Every day | INTRAMUSCULAR | Status: DC
  Administered 2018-01-30: 40 mg via INTRAVENOUS
  Filled 2018-01-30: qty 4

## 2018-01-30 MED ORDER — METOPROLOL TARTRATE 5 MG/5ML IV SOLN
5.0000 mg | Freq: Two times a day (BID) | INTRAVENOUS | Status: DC
  Administered 2018-01-30: 5 mg via INTRAVENOUS
  Filled 2018-01-30: qty 5

## 2018-01-30 MED ORDER — METHYLPREDNISOLONE SODIUM SUCC 125 MG IJ SOLR
60.0000 mg | Freq: Four times a day (QID) | INTRAMUSCULAR | Status: DC
  Administered 2018-01-30 – 2018-01-31 (×2): 60 mg via INTRAVENOUS
  Filled 2018-01-30 (×2): qty 2

## 2018-01-30 MED ORDER — SODIUM CHLORIDE 0.9 % IV BOLUS
1000.0000 mL | Freq: Once | INTRAVENOUS | Status: AC
  Administered 2018-01-30: 1000 mL via INTRAVENOUS

## 2018-01-30 MED ORDER — TIOTROPIUM BROMIDE MONOHYDRATE 18 MCG IN CAPS
18.0000 ug | ORAL_CAPSULE | Freq: Every day | RESPIRATORY_TRACT | Status: DC
  Filled 2018-01-30: qty 5

## 2018-01-30 MED ORDER — ENOXAPARIN SODIUM 40 MG/0.4ML ~~LOC~~ SOLN
40.0000 mg | SUBCUTANEOUS | Status: DC
  Administered 2018-01-30 – 2018-02-01 (×3): 40 mg via SUBCUTANEOUS
  Filled 2018-01-30 (×3): qty 0.4

## 2018-01-30 MED ORDER — ONDANSETRON HCL 4 MG PO TABS: 4.0000 mg | ORAL_TABLET | Freq: Four times a day (QID) | ORAL | Status: DC | PRN

## 2018-01-30 NOTE — H&P (Signed)
[facility] - [facility] at [facility]   PATIENT NAME: Sheila MediateSarah San    MR#:  [id number]  DATE OF BIRTH:  22-Aug-1940  DATE OF ADMISSION:  01/30/2018  PRIMARY CARE PHYSICIAN: [facility]   REQUESTING/REFERRING PHYSICIAN: Loleta Roseory Forbach, MD  CHIEF COMPLAINT:   Chief Complaint  Patient presents with  . Weakness  . Fall    HISTORY OF PRESENT ILLNESS:  Sheila Hayes  is a 77 y.o. female with a known history of COPD, chronic diastolic congestive heart failure, hypertension, hyperlipidemia, type 2 diabetes, hypothyroidism, pulmonary hypertension who presented to the ED from [facility] with confusion and a fall.  Her confusion started this morning when she was sitting on the toilet.  She fell and hit her head.  She has also been more short of breath recently, per her niece.  She has not had any cough, fevers, or chills.  She is on 2 L of oxygen at home.  D, she required BiPAP.  She was taken briefly off the BiPAP to go to CT, but she desatted to 70% on 5 L O2 by nasal cannula.  ABG was significant for pH 7.26, PCO2 108, O2 119.  Chest x-ray showed cardiomegaly with mild pulmonary vascular congestion.  She was given duonebs x3.  UA showed possible UTI so she was started on ceftriaxone.  Hospitalist called for admission.  PAST MEDICAL HISTORY:   Past Medical History:  Diagnosis Date  . Chronic diastolic congestive heart failure (HCC)   . COPD (chronic obstructive pulmonary disease) (HCC)   . Diabetes mellitus without complication (HCC)   . Hyperlipidemia   . Hypertension   . Hypothyroidism   . Pulmonary hypertension (HCC)     PAST SURGICAL HISTORY:   Past Surgical History:  Procedure Laterality Date  . APPENDECTOMY    . CHOLECYSTECTOMY      SOCIAL HISTORY:   Social History   Tobacco Use  . Smoking status: Former Games developermoker  . Smokeless tobacco: Never Used  Substance Use Topics  . Alcohol use: No    FAMILY HISTORY:   Family History    Problem Relation Age of Onset  . Heart attack Mother   . Heart attack Father   . Diabetes Sister   . Hypertension Sister   . Hypertension Brother   . Diabetes Brother   . Lung cancer Brother   . Colon cancer Brother     DRUG ALLERGIES:  No Known Allergies  REVIEW OF SYSTEMS:   ROS -unable to obtain due to altered mental status   MEDICATIONS AT HOME:   Prior to Admission medications   Medication Sig Start Date End Date Taking? Authorizing Provider  albuterol (PROVENTIL HFA;VENTOLIN HFA) 108 (90 Base) MCG/ACT inhaler Inhale 2 puffs into the lungs every 6 (six) hours as needed for wheezing or shortness of breath.    [provider]  budesonide-formoterol (SYMBICORT) 80-4.5 MCG/ACT inhaler Inhale 2 puffs into the lungs 2 (two) times daily.    [provider]  calcium carbonate (OSCAL) 1500 (600 Ca) MG TABS tablet Take by mouth 2 (two) times daily with a meal.    [provider]  citalopram (CELEXA) 40 MG tablet Take 40 mg by mouth daily.    [provider]  docusate sodium (COLACE) 100 MG capsule Take 200 mg by mouth at bedtime.    [provider]  furosemide (LASIX) 20 MG tablet Take 20 mg by mouth daily.    [provider]  gabapentin (NEURONTIN)  100 MG capsule Take 200 mg by mouth 2 (two) times daily.    [provider]  glipiZIDE (GLUCOTROL) 10 MG tablet Take 10 mg by mouth daily before breakfast.    [provider]  levothyroxine (SYNTHROID, LEVOTHROID) 125 MCG tablet Take 125 mcg by mouth daily before breakfast.    [provider]  metFORMIN (GLUCOPHAGE) 500 MG tablet Take 1,000 mg by mouth 2 (two) times daily with a meal.     [provider]  metoprolol tartrate (LOPRESSOR) 25 MG tablet Take 0.5 tablets (12.5 mg total) by mouth 2 (two) times daily. 05/20/16   Shaune Pollack, MD  nystatin (NYSTATIN) powder Apply topically 2 (two) times daily as needed. For fungal growth    [provider]  omeprazole (PRILOSEC) 20 MG capsule Take 20 mg by mouth daily.    [provider]  potassium chloride (K-DUR) 10 MEQ tablet Take 10 mEq by mouth daily.    [provider]  predniSONE (STERAPRED UNI-PAK 21 TAB) 10 MG (21) TBPK tablet 6 tabs day 1 and taper 10 mg a day - 6 days 10/10/17   Milagros Loll, MD  rosuvastatin (CRESTOR) 40 MG tablet Take 40 mg by mouth daily.    [provider]  tiotropium (SPIRIVA) 18 MCG inhalation capsule Place 18 mcg into inhaler and inhale daily.    [provider]      VITAL SIGNS:  Blood pressure (!) 126/43, pulse 92, temperature 99.4 F (37.4 C), temperature source Oral, resp. rate (!) 24, height 5\' 7"  (1.702 m), weight 111.6 kg, SpO2 98 %.  PHYSICAL EXAMINATION:  Physical Exam  GENERAL:  77 y.o.-year-old patient lying in the bed with no acute distress, BiPAP in place, does not open eyes to voice or follow commands. EYES: Pupils equal, round, reactive to light and accommodation. No scleral icterus.  HEENT: Head atraumatic, normocephalic. Oropharynx and nasopharynx clear.  NECK:  Supple, no jugular venous distention. No thyroid enlargement, no tenderness.  LUNGS: Normal work of breathing, decreased air movement throughout all lung fields, no wheezing, no crackles, BiPAP in place. CARDIOVASCULAR: S1, S2 normal. No murmurs, rubs, or gallops.  ABDOMEN: Soft, nontender, nondistended. Bowel sounds present. No organomegaly or mass.  EXTREMITIES: No pedal edema, cyanosis, or clubbing.  NEUROLOGIC: Unable to assess due to altered mental status.  Does not open eyes to voice or follow commands. PSYCHIATRIC: Unable to assess SKIN: No obvious rash, lesion, or ulcer.   LABORATORY PANEL:   CBC Recent Labs  Lab 01/30/18 1618  WBC 7.3  HGB 11.4*  HCT 35.7  PLT 183   ------------------------------------------------------------------------------------------------------------------  Chemistries  Recent Labs  Lab  01/30/18 1618 01/30/18 1627  NA 143  --   K 4.0  --   CL 93*  --   CO2 41*  --   GLUCOSE 152*  --   BUN 18  --   CREATININE 0.88  --   CALCIUM 9.0  --   MG  --  2.1  AST 18  --   ALT 9  --   ALKPHOS 43  --   BILITOT 0.6  --    ------------------------------------------------------------------------------------------------------------------  Cardiac Enzymes No results for input(s): TROPONINI in the last 168 hours. ------------------------------------------------------------------------------------------------------------------  RADIOLOGY:  Ct Head Wo Contrast  Result Date: 01/30/2018 CLINICAL DATA:  Status post fall with abrasions to the forehead. Assess for cervical spine fracture. EXAM: CT HEAD WITHOUT CONTRAST CT CERVICAL SPINE WITHOUT CONTRAST TECHNIQUE: Multidetector CT imaging of the head and  cervical spine was performed following the standard protocol without intravenous contrast. Multiplanar CT image reconstructions of the cervical spine were also generated. COMPARISON:  December 28, 2017, October 09, 2017 FINDINGS: CT HEAD FINDINGS Brain: No evidence of acute infarction, hemorrhage, hydrocephalus, extra-axial collection, mass effect. 8 mm right superior parasagittal calcified meningioma or dural calcification is unchanged. There is chronic diffuse atrophy. Chronic bilateral periventricular white matter small vessel ischemic changes noted. Vascular: No hyperdense vessel or unexpected calcification. Skull: Stable and intact.  Hyperostosis frontalis, unchanged. Sinuses/Orbits: No acute finding. Other: None. CT CERVICAL SPINE FINDINGS Alignment: Mild straightening of cervical spine stable compared to prior exam. Skull base and vertebrae: No acute fracture. No primary bone lesion or focal pathologic process. Soft tissues and spinal canal: No prevertebral fluid or swelling. No visible canal hematoma. Disc levels: Degenerative joint changes with narrowed joint spaces and facet joint sclerosis are  identified throughout cervical spine. Upper chest: Negative. Other: None. IMPRESSION: No focal acute intracranial abnormality identified. Chronic diffuse atrophy. Chronic bilateral periventricular white matter small vessel ischemic change. No acute fracture or dislocation of cervical spine. Electronically Signed   By: Sherian Rein M.D.   On: 01/30/2018 18:10   Ct Cervical Spine Wo Contrast  Result Date: 01/30/2018 CLINICAL DATA:  Status post fall with abrasions to the forehead. Assess for cervical spine fracture. EXAM: CT HEAD WITHOUT CONTRAST CT CERVICAL SPINE WITHOUT CONTRAST TECHNIQUE: Multidetector CT imaging of the head and cervical spine was performed following the standard protocol without intravenous contrast. Multiplanar CT image reconstructions of the cervical spine were also generated. COMPARISON:  December 28, 2017, October 09, 2017 FINDINGS: CT HEAD FINDINGS Brain: No evidence of acute infarction, hemorrhage, hydrocephalus, extra-axial collection, mass effect. 8 mm right superior parasagittal calcified meningioma or dural calcification is unchanged. There is chronic diffuse atrophy. Chronic bilateral periventricular white matter small vessel ischemic changes noted. Vascular: No hyperdense vessel or unexpected calcification. Skull: Stable and intact.  Hyperostosis frontalis, unchanged. Sinuses/Orbits: No acute finding. Other: None. CT CERVICAL SPINE FINDINGS Alignment: Mild straightening of cervical spine stable compared to prior exam. Skull base and vertebrae: No acute fracture. No primary bone lesion or focal pathologic process. Soft tissues and spinal canal: No prevertebral fluid or swelling. No visible canal hematoma. Disc levels: Degenerative joint changes with narrowed joint spaces and facet joint sclerosis are identified throughout cervical spine. Upper chest: Negative. Other: None. IMPRESSION: No focal acute intracranial abnormality identified. Chronic diffuse atrophy. Chronic bilateral  periventricular white matter small vessel ischemic change. No acute fracture or dislocation of cervical spine. Electronically Signed   By: Sherian Rein M.D.   On: 01/30/2018 18:10   Dg Chest Port 1 View  Result Date: 01/30/2018 CLINICAL DATA:  Sepsis, weakness EXAM: PORTABLE CHEST 1 VIEW COMPARISON:  10/09/2017 FINDINGS: There is mild bilateral interstitial thickening. There is no focal parenchymal opacity. There is no pleural effusion or pneumothorax. There is mild stable cardiomegaly. The osseous structures are unremarkable. IMPRESSION: 1. Cardiomegaly with mild pulmonary vascular congestion. Electronically Signed   By: Elige Ko   On: 01/30/2018 16:53      IMPRESSION AND PLAN:   Acute hypoxic hypercapnic respiratory failure-likely secondary to COPD exacerbation.  PCO2 108.  Patient requiring BiPAP.  May ultimately require intubation.  Chest x-ray with mild pulmonary vascular congestion. -Admit to ICU  -Continue BiPAP -Solu-Medrol IV 60 mg every 6 hours -Lasix 40 mg IV daily -Continue home Dulera and Spiriva -Cardiac monitoring  UTI- UA with small leukocytes and positive nitrates. -Continue  ceftriaxone -Follow-up urine culture  Hypertension- normotensive in the ED -Continue home metoprolol  Type 2 diabetes mellitus- blood sugar 152 in the ED -Moderate SSI -Check A1d  Hyperlipidemia- stable -Hold home Crestor while n.p.o.  Hypothyroidism- stable -Hold home Synthroid while n.p.o. -Check TSH    All the records are reviewed and case discussed with ED provider. Management plans discussed with the patient, family and they are in agreement.  CODE STATUS: Full  TOTAL TIME TAKING CARE OF THIS PATIENT: 35 minutes.    Jinny Blossom Norlan Rann M.D on 01/30/2018 at 6:32 PM  Between 7am to 6pm - Pager - [phone]  After 6pm go to [email or web address] - Social research officer, government  [facility]  Hospitalists  Office  [phone]  CC: Primary care physician; [facility]   Note: This dictation was prepared with Dragon dictation along with smaller phrase technology. Any transcriptional errors that result from this process are unintentional.

## 2018-01-30 NOTE — Progress Notes (Signed)
Family Meeting Note  Advance Directive:no  Today a meeting took place with the Patient and niece.  Patient is unable to participate due to: altered mental status  The following clinical team members were present during this meeting:MD  The following were discussed:Patient's diagnosis: , Patient's progosis: Unable to determine and Goals for treatment: Continue present management and full code  Additional follow-up to be provided: prn  Time spent during discussion:20 minutes  Sheila SinclairKaty D Dennisse Swader, MD

## 2018-01-30 NOTE — ED Notes (Signed)
Date and time results received: 01/30/18 4:53 PM  Test: Venous Blood Gas Critical Value: pCO2: 102  Name of Provider Notified: Dr. York CeriseForbach

## 2018-01-30 NOTE — ED Triage Notes (Signed)
Pt arrived via [facility] from [facility] with c/o weakness/fall. EMS states pt has had increasing weakness over the last couple of days and was sitting on the toilet and when she went to stand up she fell to the ground. Pt has an abrasion to the forehead.

## 2018-01-30 NOTE — ED Provider Notes (Signed)
[facility] Emergency Department Provider Note  ____________________________________________   First MD Initiated Contact with Patient 01/30/18 1641     (approximate)  I have reviewed the triage vital signs and the nursing notes.   HISTORY  Chief Complaint Weakness and Fall  Level 5 caveat:  history/ROS limited by acute/critical illness  HPI Sheila Hayes is a 77 y.o. female with medical history as listed below who presents for evaluation of generalized weakness and a fall.  Reportedly over the last couple of days the patient has become increasingly weak as well as increasingly confused.  Tonight she was sitting on the toilet when she fell forward and hit her forehead on the ground.  She has not been able to get up by herself and is very confused and sleepy.  She has a history of COPD as well as CHF.  Her niece is her healthcare power of attorney and is present in the room along with the patient's sister.  The niece reports that she has not been doing well recently but confirms that the patient is full code and wants everything done for her although she had a prior admission in the ICU where they told her that she may not survive another intubation.  She is very somnolent and slow to respond but does respond to voice and to simple commands.  She says that she is not in any pain but does feel short of breath.  She denies a headache but does remember striking her head on the floor.  She denies chest pain and abdominal pain.  A detailed history is not possible at this time.  Past Medical History:  Diagnosis Date  . Chronic diastolic congestive heart failure (HCC)   . COPD (chronic obstructive pulmonary disease) (HCC)   . Diabetes mellitus without complication (HCC)   . Hyperlipidemia   . Hypertension   . Hypothyroidism   . Morbid obesity (HCC)   . Pulmonary hypertension [facility])     Patient Active Problem List   Diagnosis Date Noted  . Acute respiratory  failure with hypoxia and hypercapnia (HCC) 01/30/2018  . Near syncope 10/09/2017  . HTN (hypertension) 10/09/2017  . Diabetes (HCC) 10/09/2017  . HLD (hyperlipidemia) 10/09/2017  . Chronic diastolic CHF (congestive heart failure) (HCC) 10/09/2017  . Hypothyroidism 10/09/2017  . COPD exacerbation (HCC) 05/13/2016    Past Surgical History:  Procedure Laterality Date  . APPENDECTOMY    . CHOLECYSTECTOMY      Prior to Admission medications   Medication Sig Start Date End Date Taking? Authorizing Provider  budesonide-formoterol (SYMBICORT) 80-4.5 MCG/ACT inhaler Inhale 2 puffs into the lungs 2 (two) times daily.   Yes [provider]  calcium carbonate (OSCAL) 1500 (600 Ca) MG TABS tablet Take 600 mg of elemental calcium by mouth 2 (two) times daily with a meal.    Yes [provider]  citalopram (CELEXA) 40 MG tablet Take 40 mg by mouth daily.   Yes [provider]  docusate sodium (COLACE) 100 MG capsule Take 200 mg by mouth at bedtime.   Yes [provider]  furosemide (LASIX) 20 MG tablet Take 20 mg by mouth daily.   Yes [provider]  gabapentin (NEURONTIN) 100 MG capsule Take 200 mg by mouth 2 (two) times daily.   Yes [provider]  glipiZIDE (GLUCOTROL) 10 MG tablet Take 10 mg by mouth daily before breakfast.   Yes [provider]  levothyroxine (SYNTHROID, LEVOTHROID) 125 MCG tablet Take 125  mcg by mouth daily before breakfast.   Yes [provider]  metFORMIN (GLUCOPHAGE) 500 MG tablet Take 1,000 mg by mouth 2 (two) times daily with a meal.    Yes [provider]  metoprolol tartrate (LOPRESSOR) 25 MG tablet Take 0.5 tablets (12.5 mg total) by mouth 2 (two) times daily. 05/20/16  Yes Shaune Pollackhen, Qing, MD  omeprazole (PRILOSEC) 20 MG capsule Take 20 mg by mouth daily.   Yes [provider]  OXYGEN Inhale 3 L into the lungs continuous.   Yes [provider]  potassium chloride (K-DUR) 10  MEQ tablet Take 10 mEq by mouth daily.   Yes [provider]  rosuvastatin (CRESTOR) 40 MG tablet Take 40 mg by mouth daily.   Yes [provider]  tiotropium (SPIRIVA) 18 MCG inhalation capsule Place 18 mcg into inhaler and inhale daily.   Yes [provider]  albuterol (PROVENTIL HFA;VENTOLIN HFA) 108 (90 Base) MCG/ACT inhaler Inhale 2 puffs into the lungs every 6 (six) hours as needed for wheezing or shortness of breath.    [provider]  nystatin (NYSTATIN) powder Apply topically 2 (two) times daily as needed. For fungal growth    [provider]  predniSONE (STERAPRED UNI-PAK 21 TAB) 10 MG (21) TBPK tablet 6 tabs day 1 and taper 10 mg a day - 6 days Patient not taking: Reported on 01/30/2018 10/10/17   Milagros LollSudini, Srikar, MD    Allergies Patient has no known allergies.  Family History  Problem Relation Age of Onset  . Heart attack Mother   . Heart attack Father   . Diabetes Sister   . Hypertension Sister   . Hypertension Brother   . Diabetes Brother   . Lung cancer Brother   . Colon cancer Brother     Social History Social History   Tobacco Use  . Smoking status: Former Games developermoker  . Smokeless tobacco: Never Used  Substance Use Topics  . Alcohol use: No  . Drug use: No    Review of Systems  Level 5 caveat:  history/ROS limited by acute/critical illness ____________________________________________   PHYSICAL EXAM:  VITAL SIGNS: ED Triage Vitals  Enc Vitals Group     BP 01/30/18 1608 (!) 192/179     Pulse Rate 01/30/18 1608 95     Resp 01/30/18 1608 20     Temp 01/30/18 1608 99.4 F (37.4 C)     Temp Source 01/30/18 1608 Oral     SpO2 01/30/18 1607 (!) 78 %     Weight 01/30/18 1608 111.6 kg (246 lb)     Height 01/30/18 1608 1.702 m (5\' 7" )     Head Circumference --      Peak Flow --      Pain Score 01/30/18 1608 0     Pain Loc --      Pain Edu? --      Excl. in GC? --     Constitutional: Somnolent, appears  chronically ill, difficulty staying awake for history and physical Eyes: Conjunctivae are normal.  Head: Abrasion and small hematoma on the right forehead Nose: No congestion/rhinnorhea. Mouth/Throat: Mucous membranes are moist. Neck: No stridor.  No meningeal signs.  No cervical spine tenderness to palpation. Cardiovascular: Normal rate, regular rhythm. Good peripheral circulation. Grossly normal heart sounds. Respiratory: Normal respiratory effort.  No retractions. Lungs CTAB. Gastrointestinal: Soft and nontender. No distention.  Musculoskeletal: No lower extremity tenderness nor edema. No gross deformities of extremities. Neurologic: Slow speech and language.  No gross focal neurologic deficits are appreciated, but patient has a hard time participating in the exam. Skin:  Skin is warm, dry and intact. No rash noted.   ____________________________________________   LABS (all labs ordered are listed, but only abnormal results are displayed)  Labs Reviewed  COMPREHENSIVE METABOLIC PANEL - Abnormal; Notable for the following components:      Result Value   Chloride 93 (*)    CO2 41 (*)    Glucose, Bld 152 (*)    All other components within normal limits  LACTIC ACID, PLASMA - Abnormal; Notable for the following components:   Lactic Acid, Venous 3.2 (*)    All other components within normal limits  CBC WITH DIFFERENTIAL/PLATELET - Abnormal; Notable for the following components:   Hemoglobin 11.4 (*)    MCHC 31.9 (*)    RDW 14.8 (*)    All other components within normal limits  URINALYSIS, COMPLETE (UACMP) WITH MICROSCOPIC - Abnormal; Notable for the following components:   Color, Urine YELLOW (*)    APPearance CLOUDY (*)    Hgb urine dipstick MODERATE (*)    Protein, ur 100 (*)    Nitrite POSITIVE (*)    Leukocytes, UA SMALL (*)    Bacteria, UA FEW (*)    All other components within normal limits  BLOOD GAS, VENOUS - Abnormal; Notable for the following components:   pH, Ven  7.22 (*)    pCO2, Ven 102 (*)    Bicarbonate 41.7 (*)    Acid-Base Excess 10.6 (*)    All other components within normal limits  BRAIN NATRIURETIC PEPTIDE - Abnormal; Notable for the following components:   B Natriuretic Peptide 117.0 (*)    All other components within normal limits  BLOOD GAS, ARTERIAL - Abnormal; Notable for the following components:   pH, Arterial 7.26 (*)    pCO2 arterial 108 (*)    pO2, Arterial 119 (*)    Bicarbonate 48.5 (*)    Acid-Base Excess 17.2 (*)    All other components within normal limits  CULTURE, BLOOD (ROUTINE X 2)  CULTURE, BLOOD (ROUTINE X 2)  URINE CULTURE  PROTIME-INR  MAGNESIUM   ____________________________________________  EKG  ED ECG REPORT I, Loleta Rose, the attending physician, personally viewed and interpreted this ECG.  Date: 01/30/2018 EKG Time: 16: 13 Rate: 94 Rhythm: normal sinus rhythm QRS Axis: normal Intervals: Right bundle branch block ST/T Wave abnormalities: Non-specific ST segment / T-wave changes, but no evidence of acute ischemia. Narrative Interpretation: no evidence of acute ischemia   ____________________________________________  RADIOLOGY I, Loleta Rose, personally viewed and evaluated these images (plain radiographs) as part of my medical decision making, as well as reviewing the written report by the radiologist.  ED MD interpretation: There is some mild pulmonary vascular congestion on the chest x-ray.  No acute findings on head CT nor cervical spine CT.  Official radiology report(s): Ct Head Wo Contrast  Result Date: 01/30/2018 CLINICAL DATA:  Status post fall with abrasions to the forehead. Assess for cervical spine fracture. EXAM: CT HEAD WITHOUT CONTRAST CT CERVICAL SPINE WITHOUT CONTRAST TECHNIQUE: Multidetector CT imaging of the head and cervical spine was performed following the standard protocol without intravenous contrast. Multiplanar CT image reconstructions of the cervical spine were also  generated. COMPARISON:  December 28, 2017, October 09, 2017 FINDINGS: CT HEAD FINDINGS Brain: No evidence of acute infarction, hemorrhage, hydrocephalus, extra-axial collection, mass effect. 8 mm right superior parasagittal calcified meningioma or dural calcification is unchanged.  There is chronic diffuse atrophy. Chronic bilateral periventricular white matter small vessel ischemic changes noted. Vascular: No hyperdense vessel or unexpected calcification. Skull: Stable and intact.  Hyperostosis frontalis, unchanged. Sinuses/Orbits: No acute finding. Other: None. CT CERVICAL SPINE FINDINGS Alignment: Mild straightening of cervical spine stable compared to prior exam. Skull base and vertebrae: No acute fracture. No primary bone lesion or focal pathologic process. Soft tissues and spinal canal: No prevertebral fluid or swelling. No visible canal hematoma. Disc levels: Degenerative joint changes with narrowed joint spaces and facet joint sclerosis are identified throughout cervical spine. Upper chest: Negative. Other: None. IMPRESSION: No focal acute intracranial abnormality identified. Chronic diffuse atrophy. Chronic bilateral periventricular white matter small vessel ischemic change. No acute fracture or dislocation of cervical spine. Electronically Signed   By: Sherian Rein M.D.   On: 01/30/2018 18:10   Ct Cervical Spine Wo Contrast  Result Date: 01/30/2018 CLINICAL DATA:  Status post fall with abrasions to the forehead. Assess for cervical spine fracture. EXAM: CT HEAD WITHOUT CONTRAST CT CERVICAL SPINE WITHOUT CONTRAST TECHNIQUE: Multidetector CT imaging of the head and cervical spine was performed following the standard protocol without intravenous contrast. Multiplanar CT image reconstructions of the cervical spine were also generated. COMPARISON:  December 28, 2017, October 09, 2017 FINDINGS: CT HEAD FINDINGS Brain: No evidence of acute infarction, hemorrhage, hydrocephalus, extra-axial collection, mass effect. 8 mm  right superior parasagittal calcified meningioma or dural calcification is unchanged. There is chronic diffuse atrophy. Chronic bilateral periventricular white matter small vessel ischemic changes noted. Vascular: No hyperdense vessel or unexpected calcification. Skull: Stable and intact.  Hyperostosis frontalis, unchanged. Sinuses/Orbits: No acute finding. Other: None. CT CERVICAL SPINE FINDINGS Alignment: Mild straightening of cervical spine stable compared to prior exam. Skull base and vertebrae: No acute fracture. No primary bone lesion or focal pathologic process. Soft tissues and spinal canal: No prevertebral fluid or swelling. No visible canal hematoma. Disc levels: Degenerative joint changes with narrowed joint spaces and facet joint sclerosis are identified throughout cervical spine. Upper chest: Negative. Other: None. IMPRESSION: No focal acute intracranial abnormality identified. Chronic diffuse atrophy. Chronic bilateral periventricular white matter small vessel ischemic change. No acute fracture or dislocation of cervical spine. Electronically Signed   By: Sherian Rein M.D.   On: 01/30/2018 18:10   Dg Chest Port 1 View  Result Date: 01/30/2018 CLINICAL DATA:  Sepsis, weakness EXAM: PORTABLE CHEST 1 VIEW COMPARISON:  10/09/2017 FINDINGS: There is mild bilateral interstitial thickening. There is no focal parenchymal opacity. There is no pleural effusion or pneumothorax. There is mild stable cardiomegaly. The osseous structures are unremarkable. IMPRESSION: 1. Cardiomegaly with mild pulmonary vascular congestion. Electronically Signed   By: Elige Ko   On: 01/30/2018 16:53    ____________________________________________   PROCEDURES  Critical Care performed: Yes, see critical care procedure note(s)   Procedure(s) performed:   .Critical Care Performed by: Loleta Rose, MD Authorized by: Loleta Rose, MD   Critical care provider statement:    Critical care time (minutes):  30    Critical care time was exclusive of:  Separately billable procedures and treating other patients   Critical care was necessary to treat or prevent imminent or life-threatening deterioration of the following conditions:  Respiratory failure   Critical care was time spent personally by me on the following activities:  Development of treatment plan with patient or surrogate, discussions with consultants, evaluation of patient's response to treatment, examination of patient, obtaining history from patient or surrogate, ordering and performing treatments  and interventions, ordering and review of laboratory studies, ordering and review of radiographic studies, pulse oximetry, re-evaluation of patient's condition and review of old charts     ____________________________________________   INITIAL IMPRESSION / ASSESSMENT AND PLAN / ED COURSE  As part of my medical decision making, I reviewed the following data within the electronic MEDICAL RECORD NUMBER History obtained from family, Nursing notes reviewed and incorporated, Labs reviewed , EKG interpreted , Old chart reviewed, Radiograph reviewed , Discussed with admitting physician  and Notes from prior ED visits    Differential diagnosis includes, but is not limited to, COPD exacerbation leading to acute on chronic respiratory failure, pneumonia, PE, ACS, CHF exacerbation.  The patient's vital signs are stable.  She briefly appear to be hypoxemic but she is on 2 L of oxygen at baseline.  Her VBG came back with a PCO2 greater than 100.  I started her on BiPAP immediately for the respiratory failure with hypercapnia.  We briefly took her off of the BiPAP and put her on 4 L of oxygen by nasal cannula and attempt to take her to CT, but she immediately had a desaturation down to 75% in spite of the BiPAP.  I looked at the settings and discovered that she had been placed on 20% FiO2 on the BiPAP so I increased it to 80% and slightly increase the EPAP and IPAP time  which brought her back up to 98 to 99%.  She did not change her clinical status or respiratory status at any point during the hypoxemia.  I had 2 separate discussions with the patient and more importantly with the patient's niece/healthcare power of attorney regarding her status.  I explained that her respiratory failure is very severe and that I am concerned that she may require intubation.  But I explained I am also concerned that if she is intubated she may have a hard time ever coming off of the ventilator given her degree of chronic illness, COPD, CHF, etc.  They agreed to hold off on intubation at this time but I explained again that it may happen while she is admitted this time in the ICU, and I strongly encouraged him to rethink the issue of full code versus DNR/DNI.  The niece says she will think about it.  As documented below in the clinical course, it came back that the lactic acid was 3.2, but I think this is secondary to her respiratory failure.  She has no leukocytosis and is afebrile, and although she does have a urinary tract infection, it is relatively minimal to account for a severe sepsis.  I treated the urinary tract infection with ceftriaxone 1 g IV.  Lab work is otherwise notable for a BNP of 117 which is fairly minimal.  Urine culture is pending.  Blood cultures were sent.  Coagulation studies were normal.  I discussed the case with the hospitalist in person and she will admit for further management.    Clinical Course as of Jan 31 2031  Sun Jan 30, 2018  1701 Lactic Acid, Venous(!!): 3.2 [CF]  1808 pCO2 arterial(!!): 108 [CF]  1808 The patient is noted to have a lactate>3. With the current information available to me, I don't think the patient is in septic shock. The lactate>3, is related to respiratory distress/ respiratory failure   Lactic Acid, Venous(!!): 3.2 [CF]    Clinical Course User Index [CF] Loleta Rose, MD     ____________________________________________  FINAL CLINICAL IMPRESSION(S) / ED DIAGNOSES  Final diagnoses:  Acute respiratory failure with hypoxia and hypercapnia (HCC)  Elevated lactic acid level  Altered mental status, unspecified altered mental status type  Urinary tract infection without hematuria, site unspecified     MEDICATIONS GIVEN DURING THIS VISIT:  Medications  cefTRIAXone (ROCEPHIN) 1 g in sodium chloride 0.9 % 100 mL IVPB (0 g Intravenous Stopped 01/30/18 1803)  ipratropium-albuterol (DUONEB) 0.5-2.5 (3) MG/3ML nebulizer solution 3 mL (3 mLs Nebulization Given 01/30/18 1714)  ipratropium-albuterol (DUONEB) 0.5-2.5 (3) MG/3ML nebulizer solution 3 mL (3 mLs Nebulization Given 01/30/18 1714)  ipratropium-albuterol (DUONEB) 0.5-2.5 (3) MG/3ML nebulizer solution 3 mL (3 mLs Nebulization Given 01/30/18 1714)  sodium chloride 0.9 % bolus 1,000 mL (0 mLs Intravenous Stopped 01/30/18 1715)     ED Discharge Orders    None       Note:  This document was prepared using Dragon voice recognition software and may include unintentional dictation errors.    Loleta Rose, MD 01/30/18 2032

## 2018-01-30 NOTE — Progress Notes (Signed)
Transported pt to icu from er without incident

## 2018-01-31 DIAGNOSIS — R4182 Altered mental status, unspecified: Secondary | ICD-10-CM

## 2018-01-31 DIAGNOSIS — J441 Chronic obstructive pulmonary disease with (acute) exacerbation: Secondary | ICD-10-CM

## 2018-01-31 DIAGNOSIS — N390 Urinary tract infection, site not specified: Secondary | ICD-10-CM

## 2018-01-31 DIAGNOSIS — J9622 Acute and chronic respiratory failure with hypercapnia: Secondary | ICD-10-CM

## 2018-01-31 DIAGNOSIS — J9621 Acute and chronic respiratory failure with hypoxia: Secondary | ICD-10-CM

## 2018-01-31 DIAGNOSIS — E662 Morbid (severe) obesity with alveolar hypoventilation: Secondary | ICD-10-CM

## 2018-01-31 LAB — BLOOD GAS, ARTERIAL
Acid-Base Excess: 22.7 mmol/L — ABNORMAL HIGH (ref 0.0–2.0)
BICARBONATE: 50.9 mmol/L — AB (ref 20.0–28.0)
Delivery systems: POSITIVE
FIO2: 0.5
O2 Saturation: 98.5 %
PO2 ART: 110 mmHg — AB (ref 83.0–108.0)
Patient temperature: 37
pCO2 arterial: 75 mmHg (ref 32.0–48.0)
pH, Arterial: 7.44 (ref 7.350–7.450)

## 2018-01-31 LAB — BASIC METABOLIC PANEL
ANION GAP: 9 (ref 5–15)
BUN: 19 mg/dL (ref 8–23)
CALCIUM: 8.9 mg/dL (ref 8.9–10.3)
CO2: 43 mmol/L — ABNORMAL HIGH (ref 22–32)
CREATININE: 0.79 mg/dL (ref 0.44–1.00)
Chloride: 90 mmol/L — ABNORMAL LOW (ref 98–111)
GFR calc Af Amer: >60 mL/min (ref >60)
GLUCOSE: 208 mg/dL — AB (ref 70–99)
Potassium: 3.5 mmol/L (ref 3.5–5.1)
Sodium: 142 mmol/L (ref 135–145)

## 2018-01-31 LAB — GLUCOSE, CAPILLARY
GLUCOSE-CAPILLARY: 167 mg/dL — AB (ref 70–99)
GLUCOSE-CAPILLARY: 198 mg/dL — AB (ref 70–99)
GLUCOSE-CAPILLARY: 203 mg/dL — AB (ref 70–99)
Glucose-Capillary: 180 mg/dL — ABNORMAL HIGH (ref 70–99)
Glucose-Capillary: 137 mg/dL — ABNORMAL HIGH (ref 70–99)
Glucose-Capillary: 202 mg/dL — ABNORMAL HIGH (ref 70–99)
Glucose-Capillary: 194 mg/dL — ABNORMAL HIGH (ref 70–99)

## 2018-01-31 LAB — CBC
HCT: 33.8 % — ABNORMAL LOW (ref 35.0–47.0)
HEMOGLOBIN: 10.9 g/dL — AB (ref 12.0–16.0)
MCH: 28.7 pg (ref 26.0–34.0)
MCHC: 32.3 g/dL (ref 32.0–36.0)
MCV: 88.6 fL (ref 80.0–100.0)
PLATELETS: 174 10*3/uL (ref 150–440)
RBC: 3.81 MIL/uL (ref 3.80–5.20)
RDW: 14 % (ref 11.5–14.5)
WBC: 5.7 10*3/uL (ref 3.6–11.0)

## 2018-01-31 LAB — TSH: TSH: 1.442 u[IU]/mL (ref 0.350–4.500)

## 2018-01-31 LAB — MRSA PCR SCREENING: MRSA by PCR: NEGATIVE

## 2018-01-31 MED ORDER — METHYLPREDNISOLONE SODIUM SUCC 40 MG IJ SOLR
40.0000 mg | Freq: Two times a day (BID) | INTRAMUSCULAR | Status: DC
  Administered 2018-01-31 – 2018-02-02 (×4): 40 mg via INTRAVENOUS
  Filled 2018-01-31 (×4): qty 1

## 2018-01-31 MED ORDER — CHLORHEXIDINE GLUCONATE 0.12 % MT SOLN
15.0000 mL | Freq: Two times a day (BID) | OROMUCOSAL | Status: DC
  Administered 2018-01-31 – 2018-02-02 (×5): 15 mL via OROMUCOSAL
  Filled 2018-01-31 (×4): qty 15

## 2018-01-31 MED ORDER — METOPROLOL TARTRATE 25 MG PO TABS
12.5000 mg | ORAL_TABLET | Freq: Two times a day (BID) | ORAL | Status: DC
  Administered 2018-01-31 – 2018-02-02 (×4): 12.5 mg via ORAL
  Filled 2018-01-31 (×4): qty 1

## 2018-01-31 MED ORDER — STERILE WATER FOR INJECTION IJ SOLN
INTRAMUSCULAR | Status: AC
  Administered 2018-01-31: 5 mL
  Filled 2018-01-31: qty 10

## 2018-01-31 MED ORDER — ORAL CARE MOUTH RINSE
15.0000 mL | Freq: Two times a day (BID) | OROMUCOSAL | Status: DC
  Administered 2018-01-31 – 2018-02-02 (×5): 15 mL via OROMUCOSAL

## 2018-01-31 MED ORDER — POTASSIUM CHLORIDE 10 MEQ/100ML IV SOLN
10.0000 meq | INTRAVENOUS | Status: AC
  Administered 2018-01-31 (×4): 10 meq via INTRAVENOUS
  Filled 2018-01-31 (×4): qty 100

## 2018-01-31 MED ORDER — ACETAZOLAMIDE SODIUM 500 MG IJ SOLR
500.0000 mg | Freq: Once | INTRAMUSCULAR | Status: AC
  Administered 2018-01-31: 500 mg via INTRAVENOUS
  Filled 2018-01-31: qty 500

## 2018-01-31 MED ORDER — BUDESONIDE 0.25 MG/2ML IN SUSP
0.2500 mg | Freq: Four times a day (QID) | RESPIRATORY_TRACT | Status: DC
  Administered 2018-01-31 – 2018-02-02 (×7): 0.25 mg via RESPIRATORY_TRACT
  Filled 2018-01-31 (×10): qty 2

## 2018-01-31 NOTE — Progress Notes (Signed)
[employer] [facility] - [place] [facility]   PATIENT NAME: Sheila Hayes    MR#:  [id number]  DATE OF BIRTH:  1940-09-26  SUBJECTIVE:  CHIEF COMPLAINT:   Chief Complaint  Patient presents with  . Weakness  . Fall  Patient seen and evaluated today Weaned of BiPAP Comfortable on oxygen via nasal cannula Decreased wheezing  REVIEW OF SYSTEMS:    ROS  CONSTITUTIONAL: No documented fever. No fatigue, weakness. No weight gain, no weight loss.  EYES: No blurry or double vision.  ENT: No tinnitus. No postnasal drip. No redness of the oropharynx.  RESPIRATORY: occasional cough, scattered wheeze, no hemoptysis. No dyspnea.  CARDIOVASCULAR: No chest pain. No orthopnea. No palpitations. No syncope.  GASTROINTESTINAL: No nausea, no vomiting or diarrhea. No abdominal pain. No melena or hematochezia.  GENITOURINARY: No dysuria or hematuria.  ENDOCRINE: No polyuria or nocturia. No heat or cold intolerance.  HEMATOLOGY: No anemia. No bruising. No bleeding.  INTEGUMENTARY: No rashes. No lesions.  MUSCULOSKELETAL: No arthritis. No swelling. No gout.  NEUROLOGIC: No numbness, tingling, or ataxia. No seizure-type activity.  PSYCHIATRIC: No anxiety. No insomnia. No ADD.   DRUG ALLERGIES:  No Known Allergies  VITALS:  Blood pressure 94/77, pulse 87, temperature 98.3 F (36.8 C), temperature source Oral, resp. rate 19, height 5\' 7"  (1.702 m), weight 110.3 kg, SpO2 93 %.  PHYSICAL EXAMINATION:   Physical Exam  GENERAL:  77 y.o.-year-old patient lying in the bed with no acute distress on oxygen via nasal cannula EYES: Pupils equal, round, reactive to light and accommodation. No scleral icterus. Extraocular muscles intact.  HEENT: Head atraumatic, normocephalic. Oropharynx and nasopharynx clear.  NECK:  Supple, no jugular venous distention. No thyroid enlargement, no tenderness.  LUNGS: Improved breath sounds bilaterally, bilateral wheezing. No use of accessory muscles of  respiration.  CARDIOVASCULAR: S1, S2 normal. No murmurs, rubs, or gallops.  ABDOMEN: Soft, nontender, nondistended. Bowel sounds present. No organomegaly or mass.  EXTREMITIES: No cyanosis, clubbing or edema b/l.    NEUROLOGIC: Cranial nerves II through XII are intact. No focal Motor or sensory deficits b/l.   PSYCHIATRIC: The patient is alert and oriented x 3.  SKIN: No obvious rash, lesion, or ulcer.   LABORATORY PANEL:   CBC Recent Labs  Lab 01/31/18 0517  WBC 5.7  HGB 10.9*  HCT 33.8*  PLT 174   ------------------------------------------------------------------------------------------------------------------ Chemistries  Recent Labs  Lab 01/30/18 1618 01/30/18 1627 01/31/18 0517  NA 143  --  142  K 4.0  --  3.5  CL 93*  --  90*  CO2 41*  --  43*  GLUCOSE 152*  --  208*  BUN 18  --  19  CREATININE 0.88  --  0.79  CALCIUM 9.0  --  8.9  MG  --  2.1  --   AST 18  --   --   ALT 9  --   --   ALKPHOS 43  --   --   BILITOT 0.6  --   --    ------------------------------------------------------------------------------------------------------------------  Cardiac Enzymes No results for input(s): TROPONINI in the last 168 hours. ------------------------------------------------------------------------------------------------------------------  RADIOLOGY:  Ct Head Wo Contrast  Result Date: 01/30/2018 CLINICAL DATA:  Status post fall with abrasions to the forehead. Assess for cervical spine fracture. EXAM: CT HEAD WITHOUT CONTRAST CT CERVICAL SPINE WITHOUT CONTRAST TECHNIQUE: Multidetector CT imaging of the head and cervical spine was performed following the standard protocol without intravenous contrast. Multiplanar CT image reconstructions of the  cervical spine were also generated. COMPARISON:  December 28, 2017, October 09, 2017 FINDINGS: CT HEAD FINDINGS Brain: No evidence of acute infarction, hemorrhage, hydrocephalus, extra-axial collection, mass effect. 8 mm right superior  parasagittal calcified meningioma or dural calcification is unchanged. There is chronic diffuse atrophy. Chronic bilateral periventricular white matter small vessel ischemic changes noted. Vascular: No hyperdense vessel or unexpected calcification. Skull: Stable and intact.  Hyperostosis frontalis, unchanged. Sinuses/Orbits: No acute finding. Other: None. CT CERVICAL SPINE FINDINGS Alignment: Mild straightening of cervical spine stable compared to prior exam. Skull base and vertebrae: No acute fracture. No primary bone lesion or focal pathologic process. Soft tissues and spinal canal: No prevertebral fluid or swelling. No visible canal hematoma. Disc levels: Degenerative joint changes with narrowed joint spaces and facet joint sclerosis are identified throughout cervical spine. Upper chest: Negative. Other: None. IMPRESSION: No focal acute intracranial abnormality identified. Chronic diffuse atrophy. Chronic bilateral periventricular white matter small vessel ischemic change. No acute fracture or dislocation of cervical spine. Electronically Signed   By: Sherian ReinWei-Chen  Lin M.D.   On: 01/30/2018 18:10   Ct Cervical Spine Wo Contrast  Result Date: 01/30/2018 CLINICAL DATA:  Status post fall with abrasions to the forehead. Assess for cervical spine fracture. EXAM: CT HEAD WITHOUT CONTRAST CT CERVICAL SPINE WITHOUT CONTRAST TECHNIQUE: Multidetector CT imaging of the head and cervical spine was performed following the standard protocol without intravenous contrast. Multiplanar CT image reconstructions of the cervical spine were also generated. COMPARISON:  December 28, 2017, October 09, 2017 FINDINGS: CT HEAD FINDINGS Brain: No evidence of acute infarction, hemorrhage, hydrocephalus, extra-axial collection, mass effect. 8 mm right superior parasagittal calcified meningioma or dural calcification is unchanged. There is chronic diffuse atrophy. Chronic bilateral periventricular white matter small vessel ischemic changes noted.  Vascular: No hyperdense vessel or unexpected calcification. Skull: Stable and intact.  Hyperostosis frontalis, unchanged. Sinuses/Orbits: No acute finding. Other: None. CT CERVICAL SPINE FINDINGS Alignment: Mild straightening of cervical spine stable compared to prior exam. Skull base and vertebrae: No acute fracture. No primary bone lesion or focal pathologic process. Soft tissues and spinal canal: No prevertebral fluid or swelling. No visible canal hematoma. Disc levels: Degenerative joint changes with narrowed joint spaces and facet joint sclerosis are identified throughout cervical spine. Upper chest: Negative. Other: None. IMPRESSION: No focal acute intracranial abnormality identified. Chronic diffuse atrophy. Chronic bilateral periventricular white matter small vessel ischemic change. No acute fracture or dislocation of cervical spine. Electronically Signed   By: Sherian ReinWei-Chen  Lin M.D.   On: 01/30/2018 18:10   Dg Chest Port 1 View  Result Date: 01/30/2018 CLINICAL DATA:  Sepsis, weakness EXAM: PORTABLE CHEST 1 VIEW COMPARISON:  10/09/2017 FINDINGS: There is mild bilateral interstitial thickening. There is no focal parenchymal opacity. There is no pleural effusion or pneumothorax. There is mild stable cardiomegaly. The osseous structures are unremarkable. IMPRESSION: 1. Cardiomegaly with mild pulmonary vascular congestion. Electronically Signed   By: Elige KoHetal  Patel   On: 01/30/2018 16:53     ASSESSMENT AND PLAN:   77 year old female patient with history of COPD, type 2 diabetes mellitus, hypertension currently in ICU for respiratory failure  -Acute hypoxic hypercapnic respiratory failure Weaned of BiPAP Oxygen via nasal cannula  -Acute COPD exacerbation with hypercapnia Hypercapnia could be secondary to chronic Lasix IV acetazolamide 1 dose given Continue IV Solu-Medrol and inhaler treatments  -Urinary tract infection IV ceftriaxone antibiotic and follow-up urine culture  -Diabetes mellitus  type 2 Sliding scale coverage with insulin  -Transferred to medical floor  today once stable  All the records are reviewed and case discussed with Care Management/Social Worker. Management plans discussed with the patient, family and they are in agreement.  CODE STATUS: Full code  DVT Prophylaxis: SCDs  TOTAL TIME TAKING CARE OF THIS PATIENT: 35 minutes.   POSSIBLE D/C IN 2 to 3 DAYS, DEPENDING ON CLINICAL CONDITION.  Ihor AustinPavan Jamichael Knotts M.D on 01/31/2018 at 2:31 PM  Between 7am to 6pm - Pager - [phone]  After 6pm go to [email or web address] - password [email or web address]  [facility] [place] [facility]  Office  [phone]  CC: Primary care physician; [employer] [employer] [facility]  Note: This dictation was prepared with Dragon dictation along with smaller phrase technology. Any transcriptional errors that result from this process are unintentional.

## 2018-01-31 NOTE — Evaluation (Signed)
Physical Therapy Evaluation Patient Details Name: Sheila ModenaSarah F Hayes MRN: [id number] DOB: 1940/11/29 Today's Date: 01/31/2018   History of Present Illness  Pt is a 77 y.o. female presenting to hospital 01/30/18 with generalized weakness and fall forward off of toilet hitting forehead on ground; also confusion noted.   Pt admitted with acute hypoxic hypercapnic respiratory failure likely secondary COPD exacerbation; also UTI.  PMH includes COPD (on 3 L chronic home O2), CHF, DM, htn, pulmonary htn.  Clinical Impression  Prior to hospital admission, pt was ambulatory with 4ww.  Pt lives at [facility] and has chronic 3 L home O2.  Currently pt is SBA with bed mobility.  Overall pt moves well with bed mobility but demonstrating increased SOB with activity and then c/o lightheadedness sitting edge of bed and requesting to immediately lay down.  After laying down pt's BP noted to be 112/57 and O2 90% (and then increased to 93%) on 2 L O2 via nasal cannula.  Pt would benefit from skilled PT to address noted impairments and functional limitations (see below for any additional details).  Upon hospital discharge, d/t limited mobility d/t SOB (below baseline), anticipate pt may benefit from STR but will monitor pt's status and update recommendations as appropriate.    Follow Up Recommendations SNF    Equipment Recommendations  Rolling walker with 5" wheels    Recommendations for Other Services OT consult     Precautions / Restrictions Precautions Precautions: Fall Restrictions Weight Bearing Restrictions: No      Mobility  Bed Mobility Overal bed mobility: Needs Assistance Bed Mobility: Supine to Sit;Sit to Supine     Supine to sit: Supervision;HOB elevated Sit to supine: Supervision;HOB elevated   General bed mobility comments: SBA for lines; 2 assist to boost pt up in bed (pt unable to perform on own)  Transfers                 General transfer comment: Deferred d/t pt SOB upon  sitting on edge of bed and then c/o dizziness and requesting to lay down immediately  Ambulation/Gait             General Gait Details: Deferred d/t pt SOB upon sitting on edge of bed and then c/o dizziness and requesting to lay down immediately  Stairs            Wheelchair Mobility    Modified Rankin (Stroke Patients Only)       Balance Overall balance assessment: Needs assistance Sitting-balance support: No upper extremity supported;Feet supported Sitting balance-[name] Scale: Fair Sitting balance - Comments: steady static sitting                                     Pertinent Vitals/Pain Pain Assessment: No/denies pain  HR WFL during session.    Home Living Family/patient expects to be discharged to:: Assisted living               Home Equipment: Dan HumphreysWalker - 4 wheels Additional Comments: [facility]    Prior Function Level of Independence: Needs assistance   Gait / Transfers Assistance Needed: Pt modified independent ambulating with 4ww.  Pt reports she "falls a lot" but unable to describe how she falls.  ADL's / Homemaking Assistance Needed: Assist for meals, medications, cleaning, and showers.        Hand Dominance        Extremity/Trunk Assessment  Upper Extremity Assessment Upper Extremity Assessment: Generalized weakness    Lower Extremity Assessment Lower Extremity Assessment: Generalized weakness    Cervical / Trunk Assessment Cervical / Trunk Assessment: Normal  Communication   Communication: No difficulties  Cognition Arousal/Alertness: Awake/alert Behavior During Therapy: WFL for tasks assessed/performed Overall Cognitive Status: Within Functional Limits for tasks assessed                                        General Comments   Nursing cleared pt for participation in physical therapy.  Pt agreeable to PT session.    Exercises     Assessment/Plan    PT Assessment Patient needs  continued PT services  PT Problem List Decreased strength;Decreased activity tolerance;Decreased mobility;Cardiopulmonary status limiting activity       PT Treatment Interventions DME instruction;Gait training;Functional mobility training;Therapeutic activities;Therapeutic exercise;Balance training;Patient/family education    PT Goals (Current goals can be found in the Care Plan section)  Acute Rehab PT Goals Patient Stated Goal: to go home PT Goal Formulation: With patient Time For Goal Achievement: 02/14/18 Potential to Achieve Goals: Good    Frequency Min 2X/week   Barriers to discharge Other (comment) Level of assist    Co-evaluation               AM-PAC PT "6 Clicks" Daily Activity  Outcome Measure Difficulty turning over in bed (including adjusting bedclothes, sheets and blankets)?: None Difficulty moving from lying on back to sitting on the side of the bed? : None Difficulty sitting down on and standing up from a chair with arms (e.g., wheelchair, bedside commode, etc,.)?: Unable Help needed moving to and from a bed to chair (including a wheelchair)?: A Little Help needed walking in hospital room?: A Little Help needed climbing 3-5 steps with a railing? : A Lot 6 Click Score: 17    End of Session Equipment Utilized During Treatment: Gait belt;Oxygen Activity Tolerance: Other (comment)(Limited d/t SOB and lightheadedness with activity) Patient left: in bed;with call bell/phone within reach;with bed alarm set;with family/visitor present;Other (comment)(B heels elevated via pillow) Nurse Communication: Mobility status;Precautions;Other (comment)(Pt requesting ice chips) PT Visit Diagnosis: Other abnormalities of gait and mobility (R26.89);Muscle weakness (generalized) (M62.81);Difficulty in walking, not elsewhere classified (R26.2)    Time: [id number] PT Time Calculation (min) (ACUTE ONLY): 14 min   Charges:   PT Evaluation $PT Eval Low Complexity: 1 Low          Bertice Risse, PT 01/31/18, 4:41 PM [phone]

## 2018-01-31 NOTE — Progress Notes (Signed)
OT Cancellation Note  Patient Details Name: Sheila ModenaSarah F Burtch MRN: [id number] DOB: Jun 27, 1940   Cancelled Treatment:    Reason Eval/Treat Not Completed: Fatigue/lethargy limiting ability to participate;Patient declined, no reason specified. Order received, chart reviewed. Upon attempt, pt politely declining OT evaluation this date 2/2 fatigue following PT evaluation.  Will re-attempt next date.  Richrd PrimeJamie Stiller, MPH, MS, OTR/L ascom [phone] 01/31/18, 4:36 PM

## 2018-01-31 NOTE — Consult Note (Signed)
PULMONARY / CRITICAL CARE MEDICINE   Name: Sheila Hayes MRN: [id number] DOB: 05/07/1941    ADMISSION DATE:  01/30/2018   CONSULTATION DATE:  01/30/2018  REFERRING MD: Dr. Nancy MarusMayo  Reason: Due to hypoxic respiratory failure  HISTORY OF PRESENT ILLNESS:   This is a 77 year old female, current SNF resident, who presented to the ED with complaints of generalized weakness and fall over the last couple of days.  History is obtained from ED records as patient is currently on BiPAP and cannot provide any responses.  Per ED records, patient has been getting progressively weak and confused over the last couple of days.  Last night she was sitting on the toilet when she fell forward and hit her forehead.  At the ED, patient was hypoxic and somnolent and hence was placed on BiPAP.  All imaging studies were negative.  Her ABG showed a PCO2 of108.  Her urinalysis was positive for nitrites.  She was being admitted to the ICU for further management.  PAST MEDICAL HISTORY :  She  has a past medical history of Chronic diastolic congestive heart failure (HCC), COPD (chronic obstructive pulmonary disease) (HCC), Diabetes mellitus without complication (HCC), Hyperlipidemia, Hypertension, Hypothyroidism, Morbid obesity (HCC), and Pulmonary hypertension (HCC).  PAST SURGICAL HISTORY: She  has a past surgical history that includes Cholecystectomy and Appendectomy.  No Known Allergies  No current facility-administered medications on file prior to encounter.    Current Outpatient Medications on File Prior to Encounter  Medication Sig  . budesonide-formoterol (SYMBICORT) 80-4.5 MCG/ACT inhaler Inhale 2 puffs into the lungs 2 (two) times daily.  . calcium carbonate (OSCAL) 1500 (600 Ca) MG TABS tablet Take 600 mg of elemental calcium by mouth 2 (two) times daily with a meal.   . citalopram (CELEXA) 40 MG tablet Take 40 mg by mouth daily.  Marland Kitchen. docusate sodium (COLACE) 100 MG capsule Take 200 mg by mouth at bedtime.   . furosemide (LASIX) 20 MG tablet Take 20 mg by mouth daily.  Marland Kitchen. gabapentin (NEURONTIN) 100 MG capsule Take 200 mg by mouth 2 (two) times daily.  Marland Kitchen. glipiZIDE (GLUCOTROL) 10 MG tablet Take 10 mg by mouth daily before breakfast.  . levothyroxine (SYNTHROID, LEVOTHROID) 125 MCG tablet Take 125 mcg by mouth daily before breakfast.  . metFORMIN (GLUCOPHAGE) 500 MG tablet Take 1,000 mg by mouth 2 (two) times daily with a meal.   . metoprolol tartrate (LOPRESSOR) 25 MG tablet Take 0.5 tablets (12.5 mg total) by mouth 2 (two) times daily.  Marland Kitchen. omeprazole (PRILOSEC) 20 MG capsule Take 20 mg by mouth daily.  . OXYGEN Inhale 3 L into the lungs continuous.  . potassium chloride (K-DUR) 10 MEQ tablet Take 10 mEq by mouth daily.  . rosuvastatin (CRESTOR) 40 MG tablet Take 40 mg by mouth daily.  Marland Kitchen. tiotropium (SPIRIVA) 18 MCG inhalation capsule Place 18 mcg into inhaler and inhale daily.  Marland Kitchen. albuterol (PROVENTIL HFA;VENTOLIN HFA) 108 (90 Base) MCG/ACT inhaler Inhale 2 puffs into the lungs every 6 (six) hours as needed for wheezing or shortness of breath.  . nystatin (NYSTATIN) powder Apply topically 2 (two) times daily as needed. For fungal growth  . predniSONE (STERAPRED UNI-PAK 21 TAB) 10 MG (21) TBPK tablet 6 tabs day 1 and taper 10 mg a day - 6 days (Patient not taking: Reported on 01/30/2018)    FAMILY HISTORY:  Her family history includes Colon cancer in her brother; Diabetes in her brother and sister; Heart attack in her father and mother;  Hypertension in her brother and sister; Lung cancer in her brother.  SOCIAL HISTORY: She  reports that she has quit smoking. She has never used smokeless tobacco. She reports that she does not drink alcohol or use drugs.  REVIEW OF SYSTEMS:   Unable to obtain as patient is on continuous BiPAP and not able to provide any responses  SUBJECTIVE:    VITAL SIGNS: BP (!) 139/45   Pulse 96   Temp 99.4 F (37.4 C) (Oral)   Resp (!) 21   Ht 5\' 7"  (1.702 m)   Wt 111.6  kg   SpO2 100%   BMI 38.53 kg/m   HEMODYNAMICS:    VENTILATOR SETTINGS:    INTAKE / OUTPUT: I/O last 3 completed shifts: In: 160 [IV Piggyback:160] Out: -   PHYSICAL EXAMINATION: General: Chronically ill looking Neuro: Withdraws to pain, not following commands, moves all extremities HEENT: PERRLA, trachea midline, mild JVD Cardiovascular: Apical pulse regular, S1-S2, no murmur regurg or gallop, +2 pulses bilaterally, trace edema Lungs: Bilateral breath sounds diminished in all lung fields, no wheezes or rhonchi Abdomen: Abdomen obese, normal bowel sounds in all 4 quadrants, palpation reveals no organomegaly Musculoskeletal: Stiff range of motion in upper and lower extremities Skin: Warm and dry  LABS:  BMET Recent Labs  Lab 01/30/18 1618  NA 143  K 4.0  CL 93*  CO2 41*  BUN 18  CREATININE 0.88  GLUCOSE 152*    Electrolytes Recent Labs  Lab 01/30/18 1618 01/30/18 1627  CALCIUM 9.0  --   MG  --  2.1    CBC Recent Labs  Lab 01/30/18 1618  WBC 7.3  HGB 11.4*  HCT 35.7  PLT 183    Coag's Recent Labs  Lab 01/30/18 1618  INR 0.91    Sepsis Markers Recent Labs  Lab 01/30/18 1620  LATICACIDVEN 3.2*    ABG Recent Labs  Lab 01/30/18 1745  PHART 7.26*  PCO2ART 108*  PO2ART 119*    Liver Enzymes Recent Labs  Lab 01/30/18 1618  AST 18  ALT 9  ALKPHOS 43  BILITOT 0.6  ALBUMIN 3.8    Cardiac Enzymes No results for input(s): TROPONINI, PROBNP in the last 168 hours.  Glucose Recent Labs  Lab 01/30/18 2130  GLUCAP 173*    Imaging Ct Head Wo Contrast  Result Date: 01/30/2018 CLINICAL DATA:  Status post fall with abrasions to the forehead. Assess for cervical spine fracture. EXAM: CT HEAD WITHOUT CONTRAST CT CERVICAL SPINE WITHOUT CONTRAST TECHNIQUE: Multidetector CT imaging of the head and cervical spine was performed following the standard protocol without intravenous contrast. Multiplanar CT image reconstructions of the cervical  spine were also generated. COMPARISON:  December 28, 2017, October 09, 2017 FINDINGS: CT HEAD FINDINGS Brain: No evidence of acute infarction, hemorrhage, hydrocephalus, extra-axial collection, mass effect. 8 mm right superior parasagittal calcified meningioma or dural calcification is unchanged. There is chronic diffuse atrophy. Chronic bilateral periventricular white matter small vessel ischemic changes noted. Vascular: No hyperdense vessel or unexpected calcification. Skull: Stable and intact.  Hyperostosis frontalis, unchanged. Sinuses/Orbits: No acute finding. Other: None. CT CERVICAL SPINE FINDINGS Alignment: Mild straightening of cervical spine stable compared to prior exam. Skull base and vertebrae: No acute fracture. No primary bone lesion or focal pathologic process. Soft tissues and spinal canal: No prevertebral fluid or swelling. No visible canal hematoma. Disc levels: Degenerative joint changes with narrowed joint spaces and facet joint sclerosis are identified throughout cervical spine. Upper chest: Negative. Other: None. IMPRESSION:  No focal acute intracranial abnormality identified. Chronic diffuse atrophy. Chronic bilateral periventricular white matter small vessel ischemic change. No acute fracture or dislocation of cervical spine. Electronically Signed   By: Sherian ReinWei-Chen  Lin M.D.   On: 01/30/2018 18:10   Ct Cervical Spine Wo Contrast  Result Date: 01/30/2018 CLINICAL DATA:  Status post fall with abrasions to the forehead. Assess for cervical spine fracture. EXAM: CT HEAD WITHOUT CONTRAST CT CERVICAL SPINE WITHOUT CONTRAST TECHNIQUE: Multidetector CT imaging of the head and cervical spine was performed following the standard protocol without intravenous contrast. Multiplanar CT image reconstructions of the cervical spine were also generated. COMPARISON:  December 28, 2017, October 09, 2017 FINDINGS: CT HEAD FINDINGS Brain: No evidence of acute infarction, hemorrhage, hydrocephalus, extra-axial collection, mass  effect. 8 mm right superior parasagittal calcified meningioma or dural calcification is unchanged. There is chronic diffuse atrophy. Chronic bilateral periventricular white matter small vessel ischemic changes noted. Vascular: No hyperdense vessel or unexpected calcification. Skull: Stable and intact.  Hyperostosis frontalis, unchanged. Sinuses/Orbits: No acute finding. Other: None. CT CERVICAL SPINE FINDINGS Alignment: Mild straightening of cervical spine stable compared to prior exam. Skull base and vertebrae: No acute fracture. No primary bone lesion or focal pathologic process. Soft tissues and spinal canal: No prevertebral fluid or swelling. No visible canal hematoma. Disc levels: Degenerative joint changes with narrowed joint spaces and facet joint sclerosis are identified throughout cervical spine. Upper chest: Negative. Other: None. IMPRESSION: No focal acute intracranial abnormality identified. Chronic diffuse atrophy. Chronic bilateral periventricular white matter small vessel ischemic change. No acute fracture or dislocation of cervical spine. Electronically Signed   By: Sherian ReinWei-Chen  Lin M.D.   On: 01/30/2018 18:10   Dg Chest Port 1 View  Result Date: 01/30/2018 CLINICAL DATA:  Sepsis, weakness EXAM: PORTABLE CHEST 1 VIEW COMPARISON:  10/09/2017 FINDINGS: There is mild bilateral interstitial thickening. There is no focal parenchymal opacity. There is no pleural effusion or pneumothorax. There is mild stable cardiomegaly. The osseous structures are unremarkable. IMPRESSION: 1. Cardiomegaly with mild pulmonary vascular congestion. Electronically Signed   By: Elige KoHetal  Patel   On: 01/30/2018 16:53    STUDIES:  None  CULTURES: Blood cultures x2  ANTIBIOTICS: Ceftriaxone  SIGNIFICANT EVENTS: 01/30/2018: Admitted  LINES/TUBES: Peripheral IV  DISCUSSION: 77 year old female presented with a UTI, weakness, falls, and acute hypercarbic/hypoxic respiratory failure  ASSESSMENT Acute  hypoxic/hyercarbic respiratory failure Chronic diastolic congestive heart failure  Urinary tract infection Generalized weakness with falls Type 2 diabetes  PLAN Continues BiPAP and titrate to nasal cannula as tolerated Nebulized bronchodilators and steroids Steroids Inhaled steroids Antibiotics as above Gentle IV diuresis Glucose monitoring with sliding scale insulin coverage GI and DVT prophylaxis  FAMILY  - Updates: Family at bedside.  Will update when available Magdalene S. [facility] ANP-BC Pulmonary and Critical Care Medicine [facility] Pager [phone] or [phone]  NB: This document was prepared using Dragon voice recognition software and may include unintentional dictation errors.

## 2018-01-31 NOTE — Progress Notes (Addendum)
PULMONARY / CRITICAL CARE MEDICINE   Name: Sheila ModenaSarah F Hayes MRN: [id number] DOB: 05/07/1941    ADMISSION DATE:  01/30/2018   CONSULTATION DATE:  01/30/2018  REFERRING MD: Dr. Nancy MarusMayo  Reason: Due to hypoxic respiratory failure  HISTORY OF PRESENT ILLNESS:   This is a 77 year old female, current SNF resident, who presented to the ED with complaints of generalized weakness and fall over the last couple of days.  History is obtained from ED records as patient is currently on BiPAP and cannot provide any responses.  Per ED records, patient has been getting progressively weak and confused over the last couple of days.  Last night she was sitting on the toilet when she fell forward and hit her forehead.  At the ED, patient was hypoxic and somnolent and hence was placed on BiPAP.  All imaging studies were negative.  Her ABG showed a PCO2 of108.  Her urinalysis was positive for nitrites.  She was being admitted to the ICU for further management.  PAST MEDICAL HISTORY :  She  has a past medical history of Chronic diastolic congestive heart failure (HCC), COPD (chronic obstructive pulmonary disease) (HCC), Diabetes mellitus without complication (HCC), Hyperlipidemia, Hypertension, Hypothyroidism, Morbid obesity (HCC), and Pulmonary hypertension (HCC).  PAST SURGICAL HISTORY: She  has a past surgical history that includes Cholecystectomy and Appendectomy.  No Known Allergies  No current facility-administered medications on file prior to encounter.    Current Outpatient Medications on File Prior to Encounter  Medication Sig  . budesonide-formoterol (SYMBICORT) 80-4.5 MCG/ACT inhaler Inhale 2 puffs into the lungs 2 (two) times daily.  . calcium carbonate (OSCAL) 1500 (600 Ca) MG TABS tablet Take 600 mg of elemental calcium by mouth 2 (two) times daily with a meal.   . citalopram (CELEXA) 40 MG tablet Take 40 mg by mouth daily.  Marland Kitchen. docusate sodium (COLACE) 100 MG capsule Take 200 mg by mouth at bedtime.   . furosemide (LASIX) 20 MG tablet Take 20 mg by mouth daily.  Marland Kitchen. gabapentin (NEURONTIN) 100 MG capsule Take 200 mg by mouth 2 (two) times daily.  Marland Kitchen. glipiZIDE (GLUCOTROL) 10 MG tablet Take 10 mg by mouth daily before breakfast.  . levothyroxine (SYNTHROID, LEVOTHROID) 125 MCG tablet Take 125 mcg by mouth daily before breakfast.  . metFORMIN (GLUCOPHAGE) 500 MG tablet Take 1,000 mg by mouth 2 (two) times daily with a meal.   . metoprolol tartrate (LOPRESSOR) 25 MG tablet Take 0.5 tablets (12.5 mg total) by mouth 2 (two) times daily.  Marland Kitchen. omeprazole (PRILOSEC) 20 MG capsule Take 20 mg by mouth daily.  . OXYGEN Inhale 3 L into the lungs continuous.  . potassium chloride (K-DUR) 10 MEQ tablet Take 10 mEq by mouth daily.  . rosuvastatin (CRESTOR) 40 MG tablet Take 40 mg by mouth daily.  Marland Kitchen. tiotropium (SPIRIVA) 18 MCG inhalation capsule Place 18 mcg into inhaler and inhale daily.  Marland Kitchen. albuterol (PROVENTIL HFA;VENTOLIN HFA) 108 (90 Base) MCG/ACT inhaler Inhale 2 puffs into the lungs every 6 (six) hours as needed for wheezing or shortness of breath.  . nystatin (NYSTATIN) powder Apply topically 2 (two) times daily as needed. For fungal growth  . predniSONE (STERAPRED UNI-PAK 21 TAB) 10 MG (21) TBPK tablet 6 tabs day 1 and taper 10 mg a day - 6 days (Patient not taking: Reported on 01/30/2018)    FAMILY HISTORY:  Her family history includes Colon cancer in her brother; Diabetes in her brother and sister; Heart attack in her father and mother;  Hypertension in her brother and sister; Lung cancer in her brother.  SOCIAL HISTORY: She  reports that she has quit smoking. She has never used smokeless tobacco. She reports that she does not drink alcohol or use drugs.  REVIEW OF SYSTEMS:   Positives in BOLD:   Gen: Denies fever, chills, weight change, fatigue, night sweats HEENT: Denies blurred vision, double vision, hearing loss, tinnitus, sinus congestion, rhinorrhea, sore throat, neck stiffness, dysphagia PULM:  Denies shortness of breath, cough, sputum production, hemoptysis, wheezing CV: Denies chest pain, +edema, orthopnea, paroxysmal nocturnal dyspnea, palpitations GI: Denies abdominal pain, nausea, vomiting, diarrhea, hematochezia, melena, constipation, change in bowel habits GU: Denies dysuria, hematuria, polyuria, oliguria, urethral discharge Endocrine: Denies hot or cold intolerance, polyuria, polyphagia or appetite change Derm: Denies rash, dry skin, scaling or peeling skin change Heme: Denies easy bruising, bleeding, bleeding gums Neuro: Denies headache, numbness, weakness, slurred speech, loss of memory or consciousness   SUBJECTIVE:  Pt denies SOB or any current complaints On 2L [place] Afebrile Ready to eat  VITAL SIGNS: BP 94/77   Pulse 87   Temp 98.3 F (36.8 C) (Oral)   Resp 19   Ht 5\' 7"  (1.702 m)   Wt 110.3 kg   SpO2 93%   BMI 38.09 kg/m   HEMODYNAMICS:    VENTILATOR SETTINGS: FiO2 (%):  [35 %] 35 %  INTAKE / OUTPUT: I/O last 3 completed shifts: In: 160 [IV Piggyback:160] Out: 1640 [Urine:1640]  PHYSICAL EXAMINATION: General: Chronically ill appearing female, laying in bed, on 2L [place], in NAD Neuro: Awake and alert, follows commands HEENT: Atraumatic, normocephalic, neck supple, MM pink/moist Cardiovascular: RRR, s1s2, No M/R/G, 2+ pulses bilaterally, trace edema   Lungs: Clear diminished breath wounds bilaterally, no wheezing, even, non-labored Abdomen: Obese, nontender, BS+ x4  Musculoskeletal: No deformities, active ROM all extremities  Skin: Warm, dry.  No obvious rashes, lesions, or ulcerations  LABS:  BMET Recent Labs  Lab 01/30/18 1618 01/31/18 0517  NA 143 142  K 4.0 3.5  CL 93* 90*  CO2 41* 43*  BUN 18 19  CREATININE 0.88 0.79  GLUCOSE 152* 208*    Electrolytes Recent Labs  Lab 01/30/18 1618 01/30/18 1627 01/31/18 0517  CALCIUM 9.0  --  8.9  MG  --  2.1  --     CBC Recent Labs  Lab 01/30/18 1618 01/31/18 0517  WBC 7.3 5.7   HGB 11.4* 10.9*  HCT 35.7 33.8*  PLT 183 174    Coag's Recent Labs  Lab 01/30/18 1618  INR 0.91    Sepsis Markers Recent Labs  Lab 01/30/18 1620  LATICACIDVEN 3.2*    ABG Recent Labs  Lab 01/30/18 1745 01/31/18 0005  PHART 7.26* 7.44  PCO2ART 108* 75*  PO2ART 119* 110*    Liver Enzymes Recent Labs  Lab 01/30/18 1618  AST 18  ALT 9  ALKPHOS 43  BILITOT 0.6  ALBUMIN 3.8    Cardiac Enzymes No results for input(s): TROPONINI, PROBNP in the last 168 hours.  Glucose Recent Labs  Lab 01/30/18 2130 01/31/18 0016 01/31/18 0337 01/31/18 0735 01/31/18 1137  GLUCAP 173* 167* 203* 198* 180*    Imaging Ct Head Wo Contrast  Result Date: 01/30/2018 CLINICAL DATA:  Status post fall with abrasions to the forehead. Assess for cervical spine fracture. EXAM: CT HEAD WITHOUT CONTRAST CT CERVICAL SPINE WITHOUT CONTRAST TECHNIQUE: Multidetector CT imaging of the head and cervical spine was performed following the standard protocol without intravenous contrast. Multiplanar CT image  reconstructions of the cervical spine were also generated. COMPARISON:  December 28, 2017, October 09, 2017 FINDINGS: CT HEAD FINDINGS Brain: No evidence of acute infarction, hemorrhage, hydrocephalus, extra-axial collection, mass effect. 8 mm right superior parasagittal calcified meningioma or dural calcification is unchanged. There is chronic diffuse atrophy. Chronic bilateral periventricular white matter small vessel ischemic changes noted. Vascular: No hyperdense vessel or unexpected calcification. Skull: Stable and intact.  Hyperostosis frontalis, unchanged. Sinuses/Orbits: No acute finding. Other: None. CT CERVICAL SPINE FINDINGS Alignment: Mild straightening of cervical spine stable compared to prior exam. Skull base and vertebrae: No acute fracture. No primary bone lesion or focal pathologic process. Soft tissues and spinal canal: No prevertebral fluid or swelling. No visible canal hematoma. Disc levels:  Degenerative joint changes with narrowed joint spaces and facet joint sclerosis are identified throughout cervical spine. Upper chest: Negative. Other: None. IMPRESSION: No focal acute intracranial abnormality identified. Chronic diffuse atrophy. Chronic bilateral periventricular white matter small vessel ischemic change. No acute fracture or dislocation of cervical spine. Electronically Signed   By: Sherian Rein M.D.   On: 01/30/2018 18:10   Ct Cervical Spine Wo Contrast  Result Date: 01/30/2018 CLINICAL DATA:  Status post fall with abrasions to the forehead. Assess for cervical spine fracture. EXAM: CT HEAD WITHOUT CONTRAST CT CERVICAL SPINE WITHOUT CONTRAST TECHNIQUE: Multidetector CT imaging of the head and cervical spine was performed following the standard protocol without intravenous contrast. Multiplanar CT image reconstructions of the cervical spine were also generated. COMPARISON:  December 28, 2017, October 09, 2017 FINDINGS: CT HEAD FINDINGS Brain: No evidence of acute infarction, hemorrhage, hydrocephalus, extra-axial collection, mass effect. 8 mm right superior parasagittal calcified meningioma or dural calcification is unchanged. There is chronic diffuse atrophy. Chronic bilateral periventricular white matter small vessel ischemic changes noted. Vascular: No hyperdense vessel or unexpected calcification. Skull: Stable and intact.  Hyperostosis frontalis, unchanged. Sinuses/Orbits: No acute finding. Other: None. CT CERVICAL SPINE FINDINGS Alignment: Mild straightening of cervical spine stable compared to prior exam. Skull base and vertebrae: No acute fracture. No primary bone lesion or focal pathologic process. Soft tissues and spinal canal: No prevertebral fluid or swelling. No visible canal hematoma. Disc levels: Degenerative joint changes with narrowed joint spaces and facet joint sclerosis are identified throughout cervical spine. Upper chest: Negative. Other: None. IMPRESSION: No focal acute  intracranial abnormality identified. Chronic diffuse atrophy. Chronic bilateral periventricular white matter small vessel ischemic change. No acute fracture or dislocation of cervical spine. Electronically Signed   By: Sherian Rein M.D.   On: 01/30/2018 18:10   Dg Chest Port 1 View  Result Date: 01/30/2018 CLINICAL DATA:  Sepsis, weakness EXAM: PORTABLE CHEST 1 VIEW COMPARISON:  10/09/2017 FINDINGS: There is mild bilateral interstitial thickening. There is no focal parenchymal opacity. There is no pleural effusion or pneumothorax. There is mild stable cardiomegaly. The osseous structures are unremarkable. IMPRESSION: 1. Cardiomegaly with mild pulmonary vascular congestion. Electronically Signed   By: Elige Ko   On: 01/30/2018 16:53    STUDIES:  CT Head w/o Contrast>> No focal acute intracranial abnormality identified.Chronic diffuse atrophy. Chronic bilateral periventricular white matter small vessel ischemic change. CT Cervical spine>>No acute fracture or dislocation of cervical spine.   CULTURES: Blood cultures x2>> Urine>> MRSA PCR>> positive  ANTIBIOTICS: Ceftriaxone 8/11>>  SIGNIFICANT EVENTS: 01/30/2018: Admitted  LINES/TUBES: Peripheral IV  DISCUSSION: 77 year old female presented with a UTI, weakness, falls, and acute hypercarbic/hypoxic respiratory failure  ASSESSMENT Acute hypoxic/hyercarbic respiratory failure Chronic diastolic congestive heart failure Metabolic alkalosis  Urinary tract infection Generalized weakness with falls Type 2 diabetes  PLAN -Supplemental O2 to maintain O2 sats 88 to 92%, wean as tolerated -BiPAP prn -Continue Bronchodilators and ICS -Continue Solu-medrol 40 mg q12h (consider weaning 8/13) -Maintain euvovolemia -Received Diamox 500 mg IV once 8/12 -Continue Rocephin -Follow cultures -Follow BMP & CBC -Follow intermittent CXR -CBG's with SSI -Lovenox for VTE prophylaxis  FAMILY  - Updates: Updated pt at bedside  01/31/18.  Disposition: Consider transfer out to med/surg unit later today.   Harlon DittyJeremiah Keene, AGACNP-BC [place] [facility] Pager: [phone]  PCCM ATTENDING ATTESTATION:  I have evaluated patient with the APP Elvina SidleKeene, reviewed database in its entirety and discussed care plan in detail. In addition, this patient was discussed on multidisciplinary rounds. I have personally reviewed all chest radiographs discussed herein including CXRs and CT chest unless otherwise indicated  Important exam findings: Tolerates off BiPAP Cognition intact except mildly lethargic Minimal expiratory wheezes RRR, no M Obese, NABS Extremities warm, no edema  CXR 08/11: [facility]  Major problems addressed by PCCM team: Acute/chronic hypercarbic/hypoxemic respiratory failure AE COPD Suspect OHS Metabolic alkalosis due to loop diuresis Hypokalemia   PLAN/REC: Change BiPAP to as needed only Supplemental oxygen as needed Continue nebulized steroids and bronchodilators Continue systemic steroids, dose decreased by me earlier this morning Acetazolamide x1 dose 8/12 Replete potassium If tolerates off BiPAP, consider transfer to MedSurg later in day or first thing tomorrow morning   Billy Fischeravid Janee Ureste, MD PCCM service Mobile [phone] Pager [phone] 01/31/2018 4:26 PM

## 2018-01-31 NOTE — Plan of Care (Signed)
Patient remains on BiPap.  Asleep most of shift.  Good urine output. Alert and responding appropriately.  Able to express needs. No acute distres noted. Will continue to monitor.

## 2018-01-31 NOTE — Care Management (Signed)
[facility] is from [facility]. CSW consult placed. PT/OT evaluation placed per Dr. Sung AmabileSimonds. Plan to transfer to floor care today. Patient is on chronic O2 at facility per staff.

## 2018-02-01 LAB — GLUCOSE, CAPILLARY
GLUCOSE-CAPILLARY: 142 mg/dL — AB (ref 70–99)
Glucose-Capillary: 155 mg/dL — ABNORMAL HIGH (ref 70–99)
Glucose-Capillary: 150 mg/dL — ABNORMAL HIGH (ref 70–99)
Glucose-Capillary: 189 mg/dL — ABNORMAL HIGH (ref 70–99)
Glucose-Capillary: 232 mg/dL — ABNORMAL HIGH (ref 70–99)
Glucose-Capillary: 213 mg/dL — ABNORMAL HIGH (ref 70–99)

## 2018-02-01 LAB — CBC
HCT: 32.2 % — ABNORMAL LOW (ref 35.0–47.0)
Hemoglobin: 10.6 g/dL — ABNORMAL LOW (ref 12.0–16.0)
MCH: 29.1 pg (ref 26.0–34.0)
MCHC: 33 g/dL (ref 32.0–36.0)
MCV: 88.2 fL (ref 80.0–100.0)
Platelets: 190 10*3/uL (ref 150–440)
RBC: 3.65 MIL/uL — ABNORMAL LOW (ref 3.80–5.20)
RDW: 14.2 % (ref 11.5–14.5)
WBC: 6.7 10*3/uL (ref 3.6–11.0)

## 2018-02-01 LAB — BASIC METABOLIC PANEL
ANION GAP: 5 (ref 5–15)
BUN: 22 mg/dL (ref 8–23)
CALCIUM: 8.8 mg/dL — AB (ref 8.9–10.3)
CO2: 38 mmol/L — ABNORMAL HIGH (ref 22–32)
CREATININE: 0.8 mg/dL (ref 0.44–1.00)
Chloride: 94 mmol/L — ABNORMAL LOW (ref 98–111)
Glucose, Bld: 165 mg/dL — ABNORMAL HIGH (ref 70–99)
Potassium: 3.5 mmol/L (ref 3.5–5.1)
Sodium: 137 mmol/L (ref 135–145)

## 2018-02-01 LAB — HEMOGLOBIN A1C
Hgb A1c MFr Bld: 5.3 % (ref 4.8–5.6)
MEAN PLASMA GLUCOSE: 105 mg/dL

## 2018-02-01 MED ORDER — POTASSIUM CHLORIDE CRYS ER 20 MEQ PO TBCR
20.0000 meq | EXTENDED_RELEASE_TABLET | Freq: Once | ORAL | Status: AC
  Administered 2018-02-01: 20 meq via ORAL
  Filled 2018-02-01: qty 1

## 2018-02-01 NOTE — Evaluation (Signed)
Occupational Therapy Evaluation Patient Details Name: Sheila Hayes MRN: [id number] DOB: 11/17/1940 Today's Date: 02/01/2018    History of Present Illness Pt is a 77 y.o. female presenting to hospital 01/30/18 with generalized weakness and fall forward off of toilet hitting forehead on ground; also confusion noted.   Pt admitted with acute hypoxic hypercapnic respiratory failure likely secondary COPD exacerbation; also UTI.  PMH includes COPD (on 3 L chronic home O2), CHF, DM, htn, pulmonary htn.   Clinical Impression   Pt seen for OT evaluation this date. Pt was requiring min assist for bathing, dressing and assist for meals and meds by ALF, using a rollator for mobility, and endorses a couple falls. Pt on 3 liters of O2 at home, per pt report. Pt reports becoming easily fatigued or out of breath with minimize exertion. Pt currently requires moderate assist for LB ADL and CGA for limited mobility (<5 steps alongside EOB) due to poor activity tolerance, cardiopulmonary status, and generalized weakness. (See below for additional details regarding O2 sats and HR during exertional activity). Pt educated in energy conservation conservation strategies including pursed lip breathing, activity pacing, home/routines modifications, work simplification, AE/DME, prioritizing of meaningful occupations, and falls prevention. Handout will be provided during future session. Pt verbalized understanding but would benefit from additional skilled OT services to maximize recall and carryover of learned techniques and facilitate implementation of learned techniques into daily routines. Upon discharge, recommend STR services.       Follow Up Recommendations  SNF    Equipment Recommendations  Other (comment)(TBD)    Recommendations for Other Services       Precautions / Restrictions Precautions Precautions: Fall Restrictions Weight Bearing Restrictions: No      Mobility Bed Mobility Overal bed mobility:  Needs Assistance Bed Mobility: Supine to Sit;Sit to Supine     Supine to sit: Supervision;HOB elevated Sit to supine: Supervision      Transfers Overall transfer level: Needs assistance   Transfers: Sit to/from Stand Sit to Stand: Min guard         General transfer comment: CGA with cues for hands/foot placement and hands on back of standard chair in room once in standing. Cues for PLB    Balance Overall balance assessment: Needs assistance Sitting-balance support: No upper extremity supported;Feet supported Sitting balance-[name] Scale: Fair     Standing balance support: Bilateral upper extremity supported Standing balance-[name] Scale: Fair                             ADL either performed or assessed with clinical judgement   ADL Overall ADL's : Needs assistance/impaired Eating/Feeding: Sitting;Independent   Grooming: Sitting;Modified independent   Upper Body Bathing: Sitting;Supervision/ safety   Lower Body Bathing: Sit to/from stand;Moderate assistance   Upper Body Dressing : Sitting;Supervision/safety   Lower Body Dressing: Sit to/from stand;Moderate assistance   Toilet Transfer: RW;Ambulation;BSC;Min guard           Functional mobility during ADLs: Min guard;Rolling walker       Vision Baseline Vision/History: Wears glasses Wears Glasses: Reading only Patient Visual Report: No change from baseline       Perception     Praxis      Pertinent Vitals/Pain Pain Assessment: No/denies pain     Hand Dominance Right   Extremity/Trunk Assessment Upper Extremity Assessment Upper Extremity Assessment: Generalized weakness   Lower Extremity Assessment Lower Extremity Assessment: Generalized weakness   Cervical / Trunk Assessment Cervical /  Trunk Assessment: Normal   Communication Communication Communication: No difficulties   Cognition Arousal/Alertness: Awake/alert Behavior During Therapy: WFL for tasks  assessed/performed Overall Cognitive Status: Within Functional Limits for tasks assessed                                     General Comments  O2 sats at rest in bed 95% on 3L O2, HR 65; sitting EOB HR 85 and O2 90-91%; after sidestepping along EOB and sitting, HR 78 O2 >91%. Cues for PLB and O2 sats improving within 1 minute    Exercises Other Exercises Other Exercises: Pt educated in energy conservation strategies to support return to daily routines with improved breathing and safety to perform   Shoulder Instructions      Home Living Family/patient expects to be discharged to:: Assisted living                             Home Equipment: Dan HumphreysWalker - 4 wheels   Additional Comments: [facility]      Prior Functioning/Environment Level of Independence: Needs assistance  Gait / Transfers Assistance Needed: Pt modified independent ambulating with 4ww.  Pt reports she "falls a lot" (2-3 falls) but unable to describe how she falls. ADL's / Homemaking Assistance Needed: Assist for meals, medications, cleaning, and showers. Also notes assist for dressing when she needs it.            OT Problem List: Decreased strength;Decreased knowledge of use of DME or AE;Decreased activity tolerance;Cardiopulmonary status limiting activity;Impaired balance (sitting and/or standing)      OT Treatment/Interventions: Self-care/ADL training;Therapeutic activities;Therapeutic exercise;Energy conservation;DME and/or AE instruction;Patient/family education    OT Goals(Current goals can be found in the care plan section) Acute Rehab OT Goals Patient Stated Goal: go home OT Goal Formulation: With patient Time For Goal Achievement: 02/15/18 Potential to Achieve Goals: Good ADL Goals Pt Will Transfer to Toilet: with supervision;ambulating(LRAD for amb, comfort height toilet) Additional ADL Goal #1: Pt will utilize learned pursed lip breathing techniques to support exertion  during ADL tasks with O2 sats >  16%91% on baseline O2. Additional ADL Goal #2: Pt will verbalize plan to implement at least 1 learned ECS to support independence and safety with ADL and daily routine.  OT Frequency: Min 1X/week   Barriers to D/C:            Co-evaluation              AM-PAC PT "6 Clicks" Daily Activity     Outcome Measure Help from another person eating meals?: None Help from another person taking care of personal grooming?: None Help from another person toileting, which includes using toliet, bedpan, or urinal?: A Little Help from another person bathing (including washing, rinsing, drying)?: A Lot Help from another person to put on and taking off regular upper body clothing?: A Little Help from another person to put on and taking off regular lower body clothing?: A Lot 6 Click Score: 18   End of Session Equipment Utilized During Treatment: Gait belt;Rolling walker;Oxygen  Activity Tolerance: Patient tolerated treatment well Patient left: in bed;with call bell/phone within reach;with bed alarm set  OT Visit Diagnosis: Other abnormalities of gait and mobility (R26.89);Muscle weakness (generalized) (M62.81);Repeated falls (R29.6)                Time: [id number] OT Time  Calculation (min): 26 min Charges:  OT General Charges $OT Visit: 1 Visit OT Evaluation $OT Eval Low Complexity: 1 Low OT Treatments $Self Care/Home Management : 8-22 mins  Richrd Prime, MPH, MS, OTR/L ascom [phone] 02/01/18, 2:55 PM

## 2018-02-01 NOTE — Progress Notes (Signed)
[facility] [place] [facility]   PATIENT NAME: Sheila Hayes    MR#:  [id number]  DATE OF BIRTH:  September 24, 1940  SUBJECTIVE:  CHIEF COMPLAINT:   Chief Complaint  Patient presents with  . Weakness  . Fall  Patient seen and evaluated today Weaned of BiPAP Comfortable on oxygen via nasal cannula Decreased wheezing Decreased shortness of breath Transferred to medical floor  REVIEW OF SYSTEMS:    ROS  CONSTITUTIONAL: No documented fever. No fatigue, weakness. No weight gain, no weight loss.  EYES: No blurry or double vision.  ENT: No tinnitus. No postnasal drip. No redness of the oropharynx.  RESPIRATORY: occasional cough, scattered wheeze, no hemoptysis. No dyspnea.  CARDIOVASCULAR: No chest pain. No orthopnea. No palpitations. No syncope.  GASTROINTESTINAL: No nausea, no vomiting or diarrhea. No abdominal pain. No melena or hematochezia.  GENITOURINARY: No dysuria or hematuria.  ENDOCRINE: No polyuria or nocturia. No heat or cold intolerance.  HEMATOLOGY: No anemia. No bruising. No bleeding.  INTEGUMENTARY: No rashes. No lesions.  MUSCULOSKELETAL: No arthritis. No swelling. No gout.  NEUROLOGIC: No numbness, tingling, or ataxia. No seizure-type activity.  PSYCHIATRIC: No anxiety. No insomnia. No ADD.   DRUG ALLERGIES:  No Known Allergies  VITALS:  Blood pressure 112/76, pulse 75, temperature 98.2 F (36.8 C), temperature source Axillary, resp. rate 20, height 5\' 7"  (1.702 m), weight 110.3 kg, SpO2 98 %.  PHYSICAL EXAMINATION:   Physical Exam  GENERAL:  77 y.o.-year-old patient lying in the bed with no acute distress on oxygen via nasal cannula EYES: Pupils equal, round, reactive to light and accommodation. No scleral icterus. Extraocular muscles intact.  HEENT: Head atraumatic, normocephalic. Oropharynx and nasopharynx clear.  NECK:  Supple, no jugular venous distention. No thyroid enlargement, no tenderness.  LUNGS: Improved breath sounds  bilaterally, bilateral wheezing decreased. No use of accessory muscles of respiration.  CARDIOVASCULAR: S1, S2 normal. No murmurs, rubs, or gallops.  ABDOMEN: Soft, nontender, nondistended. Bowel sounds present. No organomegaly or mass.  EXTREMITIES: No cyanosis, clubbing or edema b/l.    NEUROLOGIC: Cranial nerves II through XII are intact. No focal Motor or sensory deficits b/l.   PSYCHIATRIC: The patient is alert and oriented x 3.  SKIN: No obvious rash, lesion, or ulcer.   LABORATORY PANEL:   CBC Recent Labs  Lab 02/01/18 0417  WBC 6.7  HGB 10.6*  HCT 32.2*  PLT 190   ------------------------------------------------------------------------------------------------------------------ Chemistries  Recent Labs  Lab 01/30/18 1618 01/30/18 1627  02/01/18 0417  NA 143  --    < > 137  K 4.0  --    < > 3.5  CL 93*  --    < > 94*  CO2 41*  --    < > 38*  GLUCOSE 152*  --    < > 165*  BUN 18  --    < > 22  CREATININE 0.88  --    < > 0.80  CALCIUM 9.0  --    < > 8.8*  MG  --  2.1  --   --   AST 18  --   --   --   ALT 9  --   --   --   ALKPHOS 43  --   --   --   BILITOT 0.6  --   --   --    < > = values in this interval not displayed.   ------------------------------------------------------------------------------------------------------------------  Cardiac Enzymes No results for input(s): TROPONINI  in the last 168 hours. ------------------------------------------------------------------------------------------------------------------  RADIOLOGY:  Ct Head Wo Contrast  Result Date: 01/30/2018 CLINICAL DATA:  Status post fall with abrasions to the forehead. Assess for cervical spine fracture. EXAM: CT HEAD WITHOUT CONTRAST CT CERVICAL SPINE WITHOUT CONTRAST TECHNIQUE: Multidetector CT imaging of the head and cervical spine was performed following the standard protocol without intravenous contrast. Multiplanar CT image reconstructions of the cervical spine were also generated.  COMPARISON:  December 28, 2017, October 09, 2017 FINDINGS: CT HEAD FINDINGS Brain: No evidence of acute infarction, hemorrhage, hydrocephalus, extra-axial collection, mass effect. 8 mm right superior parasagittal calcified meningioma or dural calcification is unchanged. There is chronic diffuse atrophy. Chronic bilateral periventricular white matter small vessel ischemic changes noted. Vascular: No hyperdense vessel or unexpected calcification. Skull: Stable and intact.  Hyperostosis frontalis, unchanged. Sinuses/Orbits: No acute finding. Other: None. CT CERVICAL SPINE FINDINGS Alignment: Mild straightening of cervical spine stable compared to prior exam. Skull base and vertebrae: No acute fracture. No primary bone lesion or focal pathologic process. Soft tissues and spinal canal: No prevertebral fluid or swelling. No visible canal hematoma. Disc levels: Degenerative joint changes with narrowed joint spaces and facet joint sclerosis are identified throughout cervical spine. Upper chest: Negative. Other: None. IMPRESSION: No focal acute intracranial abnormality identified. Chronic diffuse atrophy. Chronic bilateral periventricular white matter small vessel ischemic change. No acute fracture or dislocation of cervical spine. Electronically Signed   By: Sherian ReinWei-Chen  Lin M.D.   On: 01/30/2018 18:10   Ct Cervical Spine Wo Contrast  Result Date: 01/30/2018 CLINICAL DATA:  Status post fall with abrasions to the forehead. Assess for cervical spine fracture. EXAM: CT HEAD WITHOUT CONTRAST CT CERVICAL SPINE WITHOUT CONTRAST TECHNIQUE: Multidetector CT imaging of the head and cervical spine was performed following the standard protocol without intravenous contrast. Multiplanar CT image reconstructions of the cervical spine were also generated. COMPARISON:  December 28, 2017, October 09, 2017 FINDINGS: CT HEAD FINDINGS Brain: No evidence of acute infarction, hemorrhage, hydrocephalus, extra-axial collection, mass effect. 8 mm right superior  parasagittal calcified meningioma or dural calcification is unchanged. There is chronic diffuse atrophy. Chronic bilateral periventricular white matter small vessel ischemic changes noted. Vascular: No hyperdense vessel or unexpected calcification. Skull: Stable and intact.  Hyperostosis frontalis, unchanged. Sinuses/Orbits: No acute finding. Other: None. CT CERVICAL SPINE FINDINGS Alignment: Mild straightening of cervical spine stable compared to prior exam. Skull base and vertebrae: No acute fracture. No primary bone lesion or focal pathologic process. Soft tissues and spinal canal: No prevertebral fluid or swelling. No visible canal hematoma. Disc levels: Degenerative joint changes with narrowed joint spaces and facet joint sclerosis are identified throughout cervical spine. Upper chest: Negative. Other: None. IMPRESSION: No focal acute intracranial abnormality identified. Chronic diffuse atrophy. Chronic bilateral periventricular white matter small vessel ischemic change. No acute fracture or dislocation of cervical spine. Electronically Signed   By: Sherian ReinWei-Chen  Lin M.D.   On: 01/30/2018 18:10   Dg Chest Port 1 View  Result Date: 01/30/2018 CLINICAL DATA:  Sepsis, weakness EXAM: PORTABLE CHEST 1 VIEW COMPARISON:  10/09/2017 FINDINGS: There is mild bilateral interstitial thickening. There is no focal parenchymal opacity. There is no pleural effusion or pneumothorax. There is mild stable cardiomegaly. The osseous structures are unremarkable. IMPRESSION: 1. Cardiomegaly with mild pulmonary vascular congestion. Electronically Signed   By: Elige KoHetal  Patel   On: 01/30/2018 16:53     ASSESSMENT AND PLAN:   77 year old female patient with history of COPD, type 2 diabetes mellitus, hypertension currently in  ICU for respiratory failure  -Acute hypoxic hypercapnic respiratory failure improved Weaned of BiPAP Oxygen via nasal cannula to continue  -Acute COPD exacerbation with hypercapnia improving Hypercapnia  could be secondary to chronic Lasix IV acetazolamide 1 dose given in the ICU Continue IV Solu-Medrol and inhaler treatments  -Urinary tract infection with Klebsiella IV ceftriaxone antibiotic and follow-up urine culture sensitivities  -Diabetes mellitus type 2 Sliding scale coverage with insulin  -Physical therapy evaluation  -Hypokalemia Replace potassium orally  All the records are reviewed and case discussed with Care Management/Social Worker. Management plans discussed with the patient, family and they are in agreement.  CODE STATUS: Full code  DVT Prophylaxis: SCDs  TOTAL TIME TAKING CARE OF THIS PATIENT: 35 minutes.   POSSIBLE D/C IN 1 to 2 DAYS, DEPENDING ON CLINICAL CONDITION.  Ihor AustinPavan Pyreddy M.D on 02/01/2018 at 11:25 AM  Between 7am to 6pm - Pager - [phone]  After 6pm go to [email or web address] - password [email or web address]  [facility] [place] [facility]  Office  [phone]  CC: Primary care physician; [facility]  Note: This dictation was prepared with Dragon dictation along with smaller phrase technology. Any transcriptional errors that result from this process are unintentional.

## 2018-02-01 NOTE — Clinical Social Work Note (Signed)
Clinical Social Work Assessment  Patient Details  Name: Sheila Hayes MRN: [id number] Date of Birth: Jun 24, 1940  Date of referral:  02/01/18               Reason for consult:  Discharge Planning                Permission sought to share information with:  Facility Art therapist granted to share information::  Yes, Verbal Permission Granted  Name::        Agency::     Relationship::     Contact Information:     Housing/Transportation Living arrangements for the past 2 months:  [place] of Information:  Patient Patient Interpreter Needed:  None Criminal Activity/Legal Involvement Pertinent to Current Situation/Hospitalization:  No - Comment as needed Significant Relationships:  Other(Comment)(neice) Lives with:  Self Do you feel safe going back to the place where you live?  Yes Need for family participation in patient care:  No (Coment)  Care giving concerns:  Patient resides at [facility].   Social Worker assessment / plan:  CSW met with patient while OT was working with her. CSW explained that PT had recommended short term rehab. Patient stated that she would prefer to return to [facility]. Patient is limited at this time by her breathing and endurance. She states at baseline she ambulates with a walker. CSW will see if there could potentially be any improvement tomorrow and will re-visit disposition with patient.  Employment status:  Retired Forensic scientist:  Medicare PT Recommendations:  [place] / Referral to community resources:     Patient/Family's Response to care:  Patient expressed appreciation for CSW visit.  Patient/Family's Understanding of and Emotional Response to Diagnosis, Current Treatment, and Prognosis:  Patient was disappointed to learn that PT and now OT have recommended short term rehab. Currently she is declining. CSW will follow up with [facility] to see what they are willing  to accept.  Emotional Assessment Appearance:  Appears stated age Attitude/Demeanor/Rapport:  (pleasant and cooperative) Affect (typically observed):  Accepting Orientation:  Oriented to Self, Oriented to Place, Oriented to  Time, Oriented to Situation Alcohol / Substance use:  Not Applicable Psych involvement (Current and /or in the community):  No (Comment)  Discharge Needs  Concerns to be addressed:  Care Coordination Readmission within the last 30 days:  No Current discharge risk:  None Barriers to Discharge:  No Barriers Identified   Shela Leff, LCSW 02/01/2018, 4:39 PM

## 2018-02-01 NOTE — Progress Notes (Signed)
Physical Therapy Treatment Patient Details Name: Sheila ModenaSarah F Kandel MRN: [id number] DOB: 1941-05-28 Today's Date: 02/01/2018    History of Present Illness Pt is a 77 y.o. female presenting to hospital 01/30/18 with generalized weakness and fall forward off of toilet hitting forehead on ground; also confusion noted.   Pt admitted with acute hypoxic hypercapnic respiratory failure likely secondary COPD exacerbation; also UTI.  PMH includes COPD (on 3 L chronic home O2), CHF, DM, htn, pulmonary htn.    PT Comments    Pt agreeable to PT for bed exercises only due to fatigue. Pt denies pain or SOB at rest. Participates with supine bed exercises requiring assist only for SLR. Pt notes mild SOB with movement exercises requiring brief rest between exercises. Continue PT to progress strength and endurance to allow for improved tolerance to activity and functional mobility.   Follow Up Recommendations  SNF     Equipment Recommendations  Rolling walker with 5" wheels    Recommendations for Other Services       Precautions / Restrictions Precautions Precautions: Fall Restrictions Weight Bearing Restrictions: No    Mobility  Bed Mobility Overal bed mobility: Needs Assistance Bed Mobility: Supine to Sit;Sit to Supine     Supine to sit: Supervision;HOB elevated Sit to supine: Supervision   General bed mobility comments: Not tested; fatigued and declines out of bed  Transfers Overall transfer level: Needs assistance   Transfers: Sit to/from Stand Sit to Stand: Min guard         General transfer comment: CGA with cues for hands/foot placement and hands on back of standard chair in room once in standing. Cues for PLB  Ambulation/Gait                 Stairs             Wheelchair Mobility    Modified Rankin (Stroke Patients Only)       Balance Overall balance assessment: Needs assistance Sitting-balance support: No upper extremity supported;Feet  supported Sitting balance-[name] Scale: Fair     Standing balance support: Bilateral upper extremity supported Standing balance-[name] Scale: Fair                              Cognition Arousal/Alertness: Awake/alert(Fatigued) Behavior During Therapy: WFL for tasks assessed/performed Overall Cognitive Status: Within Functional Limits for tasks assessed                                        Exercises General Exercises - Lower Extremity Ankle Circles/Pumps: AROM;Both;15 reps;Supine Quad Sets: Strengthening;Both;15 reps;Supine Gluteal Sets: Strengthening;Both;15 reps;Supine Short Arc Quad: AROM;Both;10 reps;Supine Heel Slides: AROM;Both;10 reps;Supine Hip ABduction/ADduction: AROM;Both;10 reps;Supine Straight Leg Raises: AAROM;Both;10 reps;Supine Other Exercises Other Exercises: Pt educated in energy conservation strategies to support return to daily routines with improved breathing and safety to perform    General Comments General comments (skin integrity, edema, etc.): O2 sats at rest in bed 95% on 3L O2, HR 65; sitting EOB HR 85 and O2 90-91%; after sidestepping along EOB and sitting, HR 78 O2 >91%. Cues for PLB and O2 sats improving within 1 minute      Pertinent Vitals/Pain Pain Assessment: No/denies pain    Home Living Family/patient expects to be discharged to:: Assisted living             Home Equipment: Dan HumphreysWalker -  4 wheels Additional Comments: [facility]    Prior Function Level of Independence: Needs assistance  Gait / Transfers Assistance Needed: Pt modified independent ambulating with 4ww.  Pt reports she "falls a lot" (2-3 falls) but unable to describe how she falls. ADL's / Homemaking Assistance Needed: Assist for meals, medications, cleaning, and showers. Also notes assist for dressing when she needs it.     PT Goals (current goals can now be found in the care plan section) Acute Rehab PT Goals Patient Stated Goal: go  home Progress towards PT goals: Progressing toward goals    Frequency    Min 2X/week      PT Plan Current plan remains appropriate    Co-evaluation              AM-PAC PT "6 Clicks" Daily Activity  Outcome Measure  Difficulty turning over in bed (including adjusting bedclothes, sheets and blankets)?: A Little Difficulty moving from lying on back to sitting on the side of the bed? : A Little Difficulty sitting down on and standing up from a chair with arms (e.g., wheelchair, bedside commode, etc,.)?: Unable Help needed moving to and from a bed to chair (including a wheelchair)?: A Little Help needed walking in hospital room?: A Little Help needed climbing 3-5 steps with a railing? : A Lot 6 Click Score: 15    End of Session Equipment Utilized During Treatment: Oxygen Activity Tolerance: Patient limited by fatigue Patient left: in bed;with call bell/phone within reach;with bed alarm set   PT Visit Diagnosis: Other abnormalities of gait and mobility (R26.89);Muscle weakness (generalized) (M62.81);Difficulty in walking, not elsewhere classified (R26.2)     Time: [id number] PT Time Calculation (min) (ACUTE ONLY): 17 min  Charges:  $Therapeutic Exercise: 8-22 mins                      Scot DockHeidi E Barnes, PTA 02/01/2018, 3:54 PM

## 2018-02-02 LAB — URINE CULTURE
Culture: >=100000 — AB
SPECIAL REQUESTS: NORMAL

## 2018-02-02 LAB — GLUCOSE, CAPILLARY
GLUCOSE-CAPILLARY: 105 mg/dL — AB (ref 70–99)
GLUCOSE-CAPILLARY: 187 mg/dL — AB (ref 70–99)
GLUCOSE-CAPILLARY: 189 mg/dL — AB (ref 70–99)

## 2018-02-02 MED ORDER — PREDNISONE 10 MG PO TABS: 10.0000 mg | ORAL_TABLET | Freq: Every day | ORAL | 0 refills | Status: DC

## 2018-02-02 MED ORDER — CEPHALEXIN 500 MG PO CAPS: 500.0000 mg | ORAL_CAPSULE | Freq: Four times a day (QID) | ORAL | 0 refills | Status: AC

## 2018-02-02 NOTE — Clinical Social Work Note (Signed)
Patient discharging today to return to [facility]. CSW spoke with Misty StanleyLisa at [facility] and patient is clear to come back. Patient will have home health and palliative care follow as outpatient. [facility] to transport and CSW has notified patient's niece. Discharge information faxed to [facility]. York SpanielMonica Eleanna Theilen MSW,LcSW [phone]

## 2018-02-02 NOTE — Care Management Important Message (Signed)
Copy of signed IM left with patient in room.  

## 2018-02-02 NOTE — Progress Notes (Signed)
Pt will be DC to [facility].

## 2018-02-02 NOTE — NC FL2 (Signed)
[place] MEDICAID FL2 LEVEL OF CARE SCREENING TOOL     IDENTIFICATION  Patient Name: Sheila Hayes Birthdate: November 24, 1940 Sex: female Admission Date (Current Location): 01/30/2018  [facility] [place]   Facility and Address:  [facility], [address], [place], [place] [address]      Provider Number: [id number]  Attending Physician Name and Address:  Ihor AustinPyreddy, Pavan, MD  Relative Name and Phone Number:       Current Level of Care: Hospital Recommended Level of Care: Assisted Living Facility Prior Approval Number:    Date Approved/Denied:   PASRR Number:    Discharge Plan: (ALF)    Current Diagnoses: Patient Active Problem List   Diagnosis Date Noted  . Altered mental status   . Urinary tract infection without hematuria   . Acute respiratory failure with hypoxia and hypercapnia (HCC) 01/30/2018  . Near syncope 10/09/2017  . HTN (hypertension) 10/09/2017  . Diabetes (HCC) 10/09/2017  . HLD (hyperlipidemia) 10/09/2017  . Chronic diastolic CHF (congestive heart failure) (HCC) 10/09/2017  . Hypothyroidism 10/09/2017  . COPD exacerbation (HCC) 05/13/2016    Orientation RESPIRATION BLADDER Height & Weight     Self, Time, Situation, Place  O2(2 liters) Incontinent Weight: 243 lb 2.7 oz (110.3 kg) Height:  5\' 7"  (170.2 cm)  BEHAVIORAL SYMPTOMS/MOOD NEUROLOGICAL BOWEL NUTRITION STATUS  (none) (none) Continent Diet(regular)  AMBULATORY STATUS COMMUNICATION OF NEEDS Skin   Limited Assist Verbally Normal                       Personal Care Assistance Level of Assistance  Bathing, Dressing, Feeding Bathing Assistance: Limited assistance Feeding assistance: Limited assistance Dressing Assistance: Limited assistance     Functional Limitations Info  (no issues reported)          SPECIAL CARE FACTORS FREQUENCY  PT (By licensed PT)                    Contractures Contractures Info: Not present     Additional Factors Info  Code Status Code Status Info: full             DISCHARGE MEDICATIONS:   Allergies as of 02/02/2018   No Known Allergies        Medication List    STOP taking these medications   predniSONE 10 MG (21) Tbpk tablet Commonly known as:  STERAPRED UNI-PAK 21 TAB Replaced by:  predniSONE 10 MG tablet     TAKE these medications   albuterol 108 (90 Base) MCG/ACT inhaler Commonly known as:  PROVENTIL HFA;VENTOLIN HFA Inhale 2 puffs into the lungs every 6 (six) hours as needed for wheezing or shortness of breath.   budesonide-formoterol 80-4.5 MCG/ACT inhaler Commonly known as:  SYMBICORT Inhale 2 puffs into the lungs 2 (two) times daily.   calcium carbonate 1500 (600 Ca) MG Tabs tablet Commonly known as:  OSCAL Take 600 mg of elemental calcium by mouth 2 (two) times daily with a meal.   cephALEXin 500 MG capsule Commonly known as:  KEFLEX Take 1 capsule (500 mg total) by mouth 4 (four) times daily for 3 days.   citalopram 40 MG tablet Commonly known as:  CELEXA Take 40 mg by mouth daily.   docusate sodium 100 MG capsule Commonly known as:  COLACE Take 200 mg by mouth at bedtime.   furosemide 20 MG tablet Commonly known as:  LASIX Take 20 mg by mouth daily.   gabapentin 100  MG capsule Commonly known as:  NEURONTIN Take 200 mg by mouth 2 (two) times daily.   glipiZIDE 10 MG tablet Commonly known as:  GLUCOTROL Take 10 mg by mouth daily before breakfast.   levothyroxine 125 MCG tablet Commonly known as:  SYNTHROID, LEVOTHROID Take 125 mcg by mouth daily before breakfast.   metFORMIN 500 MG tablet Commonly known as:  GLUCOPHAGE Take 1,000 mg by mouth 2 (two) times daily with a meal.   metoprolol tartrate 25 MG tablet Commonly known as:  LOPRESSOR Take 0.5 tablets (12.5 mg total) by mouth 2 (two) times daily.   nystatin powder Generic drug:  nystatin Apply topically 2 (two) times daily as needed. For fungal  growth   omeprazole 20 MG capsule Commonly known as:  PRILOSEC Take 20 mg by mouth daily.   OXYGEN Inhale 3 L into the lungs continuous.   potassium chloride 10 MEQ tablet Commonly known as:  K-DUR Take 10 mEq by mouth daily.   predniSONE 10 MG tablet Commonly known as:  DELTASONE Take 1 tablet (10 mg total) by mouth daily. Label  & dispense according to the schedule below.  6 tablets day one, then 5 table day 2, then 4 tablets day 3, then 3 tablets day 4, 2 tablets day 5, then 1 tablet day 6, then stop Replaces:  predniSONE 10 MG (21) Tbpk tablet   rosuvastatin 40 MG tablet Commonly known as:  CRESTOR Take 40 mg by mouth daily.   tiotropium 18 MCG inhalation capsule Commonly known as:  SPIRIVA Place 18 mcg into inhaler and inhale daily.        Additional Information To have Home Health RN PT and to have Palliative Care  York SpanielMonica Rola Lenn[place], [place]

## 2018-02-02 NOTE — Progress Notes (Signed)
Sheila ModenaSarah F Hayes  A and O x 4. VSS. Pt tolerating diet well. No complaints of pain or nausea. IV removed intact, prescriptions given. Pt voiced understanding of discharge instructions with no further questions. [facility]. They provided [facility] for [facility] to [facility].    Allergies as of 02/02/2018   No Known Allergies     Medication List    STOP taking these medications   predniSONE 10 MG (21) Tbpk tablet Commonly known as:  STERAPRED UNI-PAK 21 TAB Replaced by:  predniSONE 10 MG tablet     TAKE these medications   albuterol 108 (90 Base) MCG/ACT inhaler Commonly known as:  PROVENTIL HFA;VENTOLIN HFA Inhale 2 puffs into the lungs every 6 (six) hours as needed for wheezing or shortness of breath.   budesonide-formoterol 80-4.5 MCG/ACT inhaler Commonly known as:  SYMBICORT Inhale 2 puffs into the lungs 2 (two) times daily.   calcium carbonate 1500 (600 Ca) MG Tabs tablet Commonly known as:  OSCAL Take 600 mg of elemental calcium by mouth 2 (two) times daily with a meal.   cephALEXin 500 MG capsule Commonly known as:  KEFLEX Take 1 capsule (500 mg total) by mouth 4 (four) times daily for 3 days.   citalopram 40 MG tablet Commonly known as:  CELEXA Take 40 mg by mouth daily.   docusate sodium 100 MG capsule Commonly known as:  COLACE Take 200 mg by mouth at bedtime.   furosemide 20 MG tablet Commonly known as:  LASIX Take 20 mg by mouth daily.   gabapentin 100 MG capsule Commonly known as:  NEURONTIN Take 200 mg by mouth 2 (two) times daily.   glipiZIDE 10 MG tablet Commonly known as:  GLUCOTROL Take 10 mg by mouth daily before breakfast.   levothyroxine 125 MCG tablet Commonly known as:  SYNTHROID, LEVOTHROID Take 125 mcg by mouth daily before breakfast.   metFORMIN 500 MG tablet Commonly known as:  GLUCOPHAGE Take 1,000 mg by mouth 2 (two) times daily with a meal.   metoprolol tartrate 25 MG  tablet Commonly known as:  LOPRESSOR Take 0.5 tablets (12.5 mg total) by mouth 2 (two) times daily.   nystatin powder Generic drug:  nystatin Apply topically 2 (two) times daily as needed. For fungal growth   omeprazole 20 MG capsule Commonly known as:  PRILOSEC Take 20 mg by mouth daily.   OXYGEN Inhale 3 L into the lungs continuous.   potassium chloride 10 MEQ tablet Commonly known as:  K-DUR Take 10 mEq by mouth daily.   predniSONE 10 MG tablet Commonly known as:  DELTASONE Take 1 tablet (10 mg total) by mouth daily. Label  & dispense according to the schedule below.  6 tablets day one, then 5 table day 2, then 4 tablets day 3, then 3 tablets day 4, 2 tablets day 5, then 1 tablet day 6, then stop Replaces:  predniSONE 10 MG (21) Tbpk tablet   rosuvastatin 40 MG tablet Commonly known as:  CRESTOR Take 40 mg by mouth daily.   tiotropium 18 MCG inhalation capsule Commonly known as:  SPIRIVA Place 18 mcg into inhaler and inhale daily.       Vitals:   02/02/18 0815 02/02/18 1233  BP: 111/64 115/62  Pulse: 65 (!) 51  Resp:  17  Temp:  98.5 F (36.9 C)  SpO2:  97%    Sheila CloudJuan G Rodriguez Hayes

## 2018-02-02 NOTE — Discharge Summary (Signed)
[facility] [place] [facility]   PATIENT NAME: Sheila Hayes    MR#:  [id number]  DATE OF BIRTH:  [birth year]  DATE OF ADMISSION:  01/30/2018 ADMITTING PHYSICIAN: Campbell Stall, MD  DATE OF DISCHARGE: 02/02/2018  PRIMARY CARE PHYSICIAN: [facility]   ADMISSION DIAGNOSIS:  Elevated lactic acid level [R79.89] Sepsis (HCC) [A41.9] Acute respiratory failure with hypoxia and hypercapnia (HCC) [J96.01, J96.02] Urinary tract infection without hematuria, site unspecified [N39.0] Altered mental status, unspecified altered mental status type [R41.82]  DISCHARGE DIAGNOSIS:  Active Problems:   Acute respiratory failure with hypoxia and hypercapnia (HCC)   Altered mental status   Urinary tract infection without hematuria Acute COPD exacerbation Hypokalemia Klebsiella urinary tract infection  SECONDARY DIAGNOSIS:   Past Medical History:  Diagnosis Date  . Chronic diastolic congestive heart failure (HCC)   . COPD (chronic obstructive pulmonary disease) (HCC)   . Diabetes mellitus without complication (HCC)   . Hyperlipidemia   . Hypertension   . Hypothyroidism   . Morbid obesity (HCC)   . Pulmonary hypertension (HCC)      ADMITTING HISTORY Sheila Hayes  is a 77 y.o. female with a known history of COPD, chronic diastolic congestive heart failure, hypertension, hyperlipidemia, type 2 diabetes, hypothyroidism, pulmonary hypertension who presented to the ED from [facility]F with confusion and a fall.  Her confusion started this morning when she was sitting on the toilet.  She fell and hit her head.  She has also been more short of breath recently, per her niece.  She has not had any cough, fevers, or chills.  She is on 2 L of oxygen at home.  D, she required BiPAP.  She was taken briefly off the BiPAP to go to CT, but she desatted to 70% on 5 L O2 by nasal cannula.  ABG was significant for pH 7.26, PCO2 108, O2 119.  Chest x-ray showed cardiomegaly  with mild pulmonary vascular congestion.  She was given duonebs x3.  UA showed possible UTI so she was started on ceftriaxone.  Hospitalist called for admission.  HOSPITAL COURSE:  Patient was put on BiPAP for respiratory failure and admitted to stepdown unit.  She was put on IV Rocephin antibiotic for urinary tract infection.  Patient received IV Solu-Medrol and nebulization treatments for COPD exacerbation.  She was weaned of BiPAP and put on oxygen via nasal cannula.  Her urine culture grew Klebsiella which was sensitive to cephalosporins.  Patient tolerated IV antibiotics well.  She was moved to regular medical floor and received physical therapy.  Patient is back to oxygen via nasal cannula at baseline.  She will be discharged back to [facility] assisted living facility with physical therapy services and home health services.  Palliative care services to continue at [facility] assisted living facility.  CONSULTS OBTAINED:    DRUG ALLERGIES:  No Known Allergies  DISCHARGE MEDICATIONS:   Allergies as of 02/02/2018   No Known Allergies     Medication List    STOP taking these medications   predniSONE 10 MG (21) Tbpk tablet Commonly known as:  STERAPRED UNI-PAK 21 TAB Replaced by:  predniSONE 10 MG tablet     TAKE these medications   albuterol 108 (90 Base) MCG/ACT inhaler Commonly known as:  PROVENTIL HFA;VENTOLIN HFA Inhale 2 puffs into the lungs every 6 (six) hours as needed for wheezing or shortness of breath.   budesonide-formoterol 80-4.5 MCG/ACT inhaler Commonly known as:  SYMBICORT Inhale 2 puffs into the  lungs 2 (two) times daily.   calcium carbonate 1500 (600 Ca) MG Tabs tablet Commonly known as:  OSCAL Take 600 mg of elemental calcium by mouth 2 (two) times daily with a meal.   cephALEXin 500 MG capsule Commonly known as:  KEFLEX Take 1 capsule (500 mg total) by mouth 4 (four) times daily for 3 days.   citalopram 40 MG tablet Commonly known as:  CELEXA Take 40 mg  by mouth daily.   docusate sodium 100 MG capsule Commonly known as:  COLACE Take 200 mg by mouth at bedtime.   furosemide 20 MG tablet Commonly known as:  LASIX Take 20 mg by mouth daily.   gabapentin 100 MG capsule Commonly known as:  NEURONTIN Take 200 mg by mouth 2 (two) times daily.   glipiZIDE 10 MG tablet Commonly known as:  GLUCOTROL Take 10 mg by mouth daily before breakfast.   levothyroxine 125 MCG tablet Commonly known as:  SYNTHROID, LEVOTHROID Take 125 mcg by mouth daily before breakfast.   metFORMIN 500 MG tablet Commonly known as:  GLUCOPHAGE Take 1,000 mg by mouth 2 (two) times daily with a meal.   metoprolol tartrate 25 MG tablet Commonly known as:  LOPRESSOR Take 0.5 tablets (12.5 mg total) by mouth 2 (two) times daily.   nystatin powder Generic drug:  nystatin Apply topically 2 (two) times daily as needed. For fungal growth   omeprazole 20 MG capsule Commonly known as:  PRILOSEC Take 20 mg by mouth daily.   OXYGEN Inhale 3 L into the lungs continuous.   potassium chloride 10 MEQ tablet Commonly known as:  K-DUR Take 10 mEq by mouth daily.   predniSONE 10 MG tablet Commonly known as:  DELTASONE Take 1 tablet (10 mg total) by mouth daily. Label  & dispense according to the schedule below.  6 tablets day one, then 5 table day 2, then 4 tablets day 3, then 3 tablets day 4, 2 tablets day 5, then 1 tablet day 6, then stop Replaces:  predniSONE 10 MG (21) Tbpk tablet   rosuvastatin 40 MG tablet Commonly known as:  CRESTOR Take 40 mg by mouth daily.   tiotropium 18 MCG inhalation capsule Commonly known as:  SPIRIVA Place 18 mcg into inhaler and inhale daily.       Today  Patient seen and evaluated today No shortness of breath No fever Tolerating diet well Patient will be [facility] on oral Keflex antibiotic VITAL SIGNS:  Blood pressure 111/64, pulse 65, temperature 97.9 F (36.6 C), temperature source  Oral, resp. rate 18, height 5\' 7"  (1.702 m), weight 110.3 kg, SpO2 93 %.  I/O:    Intake/Output Summary (Last 24 hours) at 02/02/2018 1148 Last data filed at 02/02/2018 0900 Gross per 24 hour  Intake 940 ml  Output 1250 ml  Net -310 ml    PHYSICAL EXAMINATION:  Physical Exam  GENERAL:  77 y.o.-year-old patient lying in the bed with no acute distress.  LUNGS: Normal breath sounds bilaterally, no wheezing, rales,rhonchi or crepitation. No use of accessory muscles of respiration.  CARDIOVASCULAR: S1, S2 normal. No murmurs, rubs, or gallops.  ABDOMEN: Soft, non-tender, non-distended. Bowel sounds present. No organomegaly or mass.  NEUROLOGIC: Moves all 4 extremities. PSYCHIATRIC: The patient is alert and oriented x 3.  SKIN: No obvious rash, lesion, or ulcer.   DATA REVIEW:   CBC Recent Labs  Lab 02/01/18 0417  WBC 6.7  HGB 10.6*  HCT 32.2*  PLT  190    Chemistries  Recent Labs  Lab 01/30/18 1618 01/30/18 1627  02/01/18 0417  NA 143  --    < > 137  K 4.0  --    < > 3.5  CL 93*  --    < > 94*  CO2 41*  --    < > 38*  GLUCOSE 152*  --    < > 165*  BUN 18  --    < > 22  CREATININE 0.88  --    < > 0.80  CALCIUM 9.0  --    < > 8.8*  MG  --  2.1  --   --   AST 18  --   --   --   ALT 9  --   --   --   ALKPHOS 43  --   --   --   BILITOT 0.6  --   --   --    < > = values in this interval not displayed.    Cardiac Enzymes No results for input(s): TROPONINI in the last 168 hours.  Microbiology Results  Results for orders placed or performed during the hospital encounter of 01/30/18  Culture, blood (Routine x 2)     Status: None (Preliminary result)   Collection Time: 01/30/18  4:18 PM  Result Value Ref Range Status   Specimen Description BLOOD RAC  Final   Special Requests   Final    BOTTLES DRAWN AEROBIC AND ANAEROBIC Blood Culture results may not be optimal due to an excessive volume of blood received in culture bottles   Culture   Final    NO GROWTH 3  DAYS Performed at [facility], [address] [place], [place] [address]    Report Status PENDING  Incomplete  Culture, blood (Routine x 2)     Status: None (Preliminary result)   Collection Time: 01/30/18  4:18 PM  Result Value Ref Range Status   Specimen Description BLOOD LFA  Final   Special Requests   Final    BOTTLES DRAWN AEROBIC AND ANAEROBIC Blood Culture adequate volume   Culture   Final    NO GROWTH 3 DAYS Performed at [facility], [address] [place], [place] [address]    Report Status PENDING  Incomplete  Urine Culture     Status: Abnormal   Collection Time: 01/30/18  4:20 PM  Result Value Ref Range Status   Specimen Description   Final    URINE, CATHETERIZED Performed at [facility], [address] [place], [place] [address]    Special Requests   Final    Normal Performed at [facility], [address] [place], [place] [address]    Culture >=100,000 COLONIES/mL KLEBSIELLA PNEUMONIAE (A)  Final   Report Status 02/02/2018 FINAL  Final   Organism ID, Bacteria KLEBSIELLA PNEUMONIAE (A)  Final      Susceptibility   Klebsiella pneumoniae - MIC*    AMPICILLIN >=32 RESISTANT Resistant     CEFAZOLIN <=4 SENSITIVE Sensitive     CEFTRIAXONE <=1 SENSITIVE Sensitive     CIPROFLOXACIN <=0.25 SENSITIVE Sensitive     GENTAMICIN <=1 SENSITIVE Sensitive     IMIPENEM <=0.25 SENSITIVE Sensitive     NITROFURANTOIN 64 INTERMEDIATE Intermediate     TRIMETH/SULFA <=20 SENSITIVE Sensitive     AMPICILLIN/SULBACTAM 4 SENSITIVE Sensitive     PIP/TAZO <=4 SENSITIVE Sensitive     Extended ESBL NEGATIVE Sensitive     * >=  100,000 COLONIES/mL KLEBSIELLA PNEUMONIAE  MRSA PCR Screening     Status: None   Collection Time: 01/30/18 11:29 PM  Result Value Ref Range Status   MRSA by PCR NEGATIVE NEGATIVE Final    Comment:        The GeneXpert MRSA Assay (FDA approved for NASAL specimens only), is one component of a comprehensive MRSA  colonization surveillance program. It is not intended to diagnose MRSA infection nor to guide or monitor treatment for MRSA infections. Performed at [facility], [address], [place], [place] [address]     RADIOLOGY:  No results found.  Follow up with PCP in 1 week.  Management plans discussed with the patient, family and they are in agreement.  CODE STATUS: Full code    Code Status Orders  (From admission, onward)         Start     Ordered   01/30/18 2147  Full code  Continuous     01/30/18 2146        Code Status History    Date Active Date Inactive Code Status Order ID Comments User Context   10/09/2017 2206 10/11/2017 1928 Full Code [id number]  Oralia Manis, MD Inpatient   05/13/2016 1117 05/20/2016 2022 Full Code [id number]  Eugenie Norrie, NP ED      TOTAL TIME TAKING CARE OF THIS PATIENT ON DAY OF DISCHARGE: more than 35 minutes.   Ihor Austin M.D on 02/02/2018 at 11:48 AM  Between 7am to 6pm - Pager - [phone]  After 6pm go to [email or web address] - password [email or web address]  [facility]  Office  [phone]  CC: Primary care physician; [facility]  Note: This dictation was prepared with Dragon dictation along with smaller phrase technology. Any transcriptional errors that result from this process are unintentional.

## 2018-02-02 NOTE — Care Management Note (Signed)
Case Management Note  Patient Details  Name: Sheila ModenaSarah F Hayes MRN: [id number] Date of Birth: Jul 30, 1940   Patient to discharge back to facility today.  Patient has selected [facility] home [facility] for services.  Deniesha with [facility] notified.  RNCM signing off.   Subjective/Objective:                    Action/Plan:   Expected Discharge Date:  02/02/18               Expected Discharge Plan:  Assisted Living / Rest Home(with home health )  In-House Referral:     Discharge planning Services     Post Acute Care Choice:  Home Health Choice offered to:  Patient  DME Arranged:    DME Agency:     HH Arranged:  RN, PT HH Agency:  [facility]  Status of Service:  Completed, signed off  If discussed at Long Length of Stay Meetings, dates discussed:    Additional Comments:  Chapman FitchBOWEN, Galen Malkowski T, RN 02/02/2018, 1:45 PM

## 2018-02-04 LAB — CULTURE, BLOOD (ROUTINE X 2)
Culture: NO GROWTH
Culture: NO GROWTH
SPECIAL REQUESTS: ADEQUATE

## 2018-02-23 ENCOUNTER — Emergency Department: Payer: Medicare Other

## 2018-02-23 ENCOUNTER — Inpatient Hospital Stay
Admission: EM | Admit: 2018-02-23 | Discharge: 2018-02-28 | DRG: 291 | Disposition: A | Discharge status: Home Health | Payer: Medicare Other | Source: Skilled Nursing Facility | Attending: Internal Medicine | Admitting: Internal Medicine

## 2018-02-23 DIAGNOSIS — Z87891 Personal history of nicotine dependence: Secondary | ICD-10-CM | POA: Diagnosis not present

## 2018-02-23 DIAGNOSIS — E039 Hypothyroidism, unspecified: Secondary | ICD-10-CM | POA: Diagnosis present

## 2018-02-23 DIAGNOSIS — J9811 Atelectasis: Secondary | ICD-10-CM | POA: Diagnosis present

## 2018-02-23 DIAGNOSIS — J9602 Acute respiratory failure with hypercapnia: Secondary | ICD-10-CM | POA: Diagnosis not present

## 2018-02-23 DIAGNOSIS — I110 Hypertensive heart disease with heart failure: Principal | ICD-10-CM | POA: Diagnosis present

## 2018-02-23 DIAGNOSIS — Z800 Family history of malignant neoplasm of digestive organs: Secondary | ICD-10-CM

## 2018-02-23 DIAGNOSIS — J9622 Acute and chronic respiratory failure with hypercapnia: Secondary | ICD-10-CM | POA: Diagnosis present

## 2018-02-23 DIAGNOSIS — J9601 Acute respiratory failure with hypoxia: Secondary | ICD-10-CM | POA: Diagnosis present

## 2018-02-23 DIAGNOSIS — I5033 Acute on chronic diastolic (congestive) heart failure: Secondary | ICD-10-CM | POA: Diagnosis present

## 2018-02-23 DIAGNOSIS — Z6838 Body mass index (BMI) 38.0-38.9, adult: Secondary | ICD-10-CM

## 2018-02-23 DIAGNOSIS — R4182 Altered mental status, unspecified: Secondary | ICD-10-CM | POA: Diagnosis present

## 2018-02-23 DIAGNOSIS — T380X5A Adverse effect of glucocorticoids and synthetic analogues, initial encounter: Secondary | ICD-10-CM | POA: Diagnosis present

## 2018-02-23 DIAGNOSIS — Z8249 Family history of ischemic heart disease and other diseases of the circulatory system: Secondary | ICD-10-CM

## 2018-02-23 DIAGNOSIS — Z7984 Long term (current) use of oral hypoglycemic drugs: Secondary | ICD-10-CM | POA: Diagnosis not present

## 2018-02-23 DIAGNOSIS — Z801 Family history of malignant neoplasm of trachea, bronchus and lung: Secondary | ICD-10-CM | POA: Diagnosis not present

## 2018-02-23 DIAGNOSIS — E1165 Type 2 diabetes mellitus with hyperglycemia: Secondary | ICD-10-CM | POA: Diagnosis not present

## 2018-02-23 DIAGNOSIS — Z833 Family history of diabetes mellitus: Secondary | ICD-10-CM

## 2018-02-23 DIAGNOSIS — J441 Chronic obstructive pulmonary disease with (acute) exacerbation: Secondary | ICD-10-CM | POA: Diagnosis present

## 2018-02-23 DIAGNOSIS — X58XXXA Exposure to other specified factors, initial encounter: Secondary | ICD-10-CM | POA: Diagnosis present

## 2018-02-23 DIAGNOSIS — I4510 Unspecified right bundle-branch block: Secondary | ICD-10-CM | POA: Diagnosis present

## 2018-02-23 DIAGNOSIS — Z79890 Hormone replacement therapy (postmenopausal): Secondary | ICD-10-CM | POA: Diagnosis not present

## 2018-02-23 DIAGNOSIS — Z9049 Acquired absence of other specified parts of digestive tract: Secondary | ICD-10-CM | POA: Diagnosis not present

## 2018-02-23 DIAGNOSIS — E785 Hyperlipidemia, unspecified: Secondary | ICD-10-CM | POA: Diagnosis present

## 2018-02-23 DIAGNOSIS — I272 Pulmonary hypertension, unspecified: Secondary | ICD-10-CM | POA: Diagnosis present

## 2018-02-23 DIAGNOSIS — Z7951 Long term (current) use of inhaled steroids: Secondary | ICD-10-CM

## 2018-02-23 DIAGNOSIS — Z22322 Carrier or suspected carrier of Methicillin resistant Staphylococcus aureus: Secondary | ICD-10-CM | POA: Diagnosis not present

## 2018-02-23 DIAGNOSIS — D649 Anemia, unspecified: Secondary | ICD-10-CM | POA: Diagnosis present

## 2018-02-23 DIAGNOSIS — I10 Essential (primary) hypertension: Secondary | ICD-10-CM | POA: Diagnosis present

## 2018-02-23 DIAGNOSIS — Z79899 Other long term (current) drug therapy: Secondary | ICD-10-CM | POA: Diagnosis not present

## 2018-02-23 DIAGNOSIS — J9621 Acute and chronic respiratory failure with hypoxia: Secondary | ICD-10-CM | POA: Diagnosis present

## 2018-02-23 DIAGNOSIS — E119 Type 2 diabetes mellitus without complications: Secondary | ICD-10-CM

## 2018-02-23 LAB — CBC WITH DIFFERENTIAL/PLATELET
Basophils Absolute: 0 10*3/uL (ref 0–0.1)
Basophils Relative: 1 %
EOS ABS: 0 10*3/uL (ref 0–0.7)
EOS PCT: 0 %
HCT: 32.2 % — ABNORMAL LOW (ref 35.0–47.0)
HEMOGLOBIN: 10.6 g/dL — AB (ref 12.0–16.0)
LYMPHS ABS: 1 10*3/uL (ref 1.0–3.6)
Lymphocytes Relative: 15 %
MCH: 28.9 pg (ref 26.0–34.0)
MCHC: 32.8 g/dL (ref 32.0–36.0)
MCV: 88.3 fL (ref 80.0–100.0)
MONOS PCT: 7 %
Monocytes Absolute: 0.4 10*3/uL (ref 0.2–0.9)
NEUTROS PCT: 77 %
Neutro Abs: 4.8 10*3/uL (ref 1.4–6.5)
Platelets: 177 10*3/uL (ref 150–440)
RBC: 3.65 MIL/uL — ABNORMAL LOW (ref 3.80–5.20)
RDW: 14.1 % (ref 11.5–14.5)
WBC: 6.3 10*3/uL (ref 3.6–11.0)

## 2018-02-23 LAB — COMPREHENSIVE METABOLIC PANEL
ALT: 9 U/L (ref 0–44)
ANION GAP: 9 (ref 5–15)
AST: 12 U/L — ABNORMAL LOW (ref 15–41)
Albumin: 3.4 g/dL — ABNORMAL LOW (ref 3.5–5.0)
Alkaline Phosphatase: 43 U/L (ref 38–126)
BUN: 11 mg/dL (ref 8–23)
CO2: 43 mmol/L — AB (ref 22–32)
Calcium: 8.8 mg/dL — ABNORMAL LOW (ref 8.9–10.3)
Chloride: 90 mmol/L — ABNORMAL LOW (ref 98–111)
Creatinine, Ser: 0.72 mg/dL (ref 0.44–1.00)
GFR calc non Af Amer: >60 mL/min (ref >60)
Glucose, Bld: 89 mg/dL (ref 70–99)
Potassium: 3.7 mmol/L (ref 3.5–5.1)
SODIUM: 142 mmol/L (ref 135–145)
TOTAL PROTEIN: 6.6 g/dL (ref 6.5–8.1)
Total Bilirubin: 0.7 mg/dL (ref 0.3–1.2)

## 2018-02-23 LAB — URINALYSIS, COMPLETE (UACMP) WITH MICROSCOPIC
Bilirubin Urine: NEGATIVE
GLUCOSE, UA: NEGATIVE mg/dL
KETONES UR: NEGATIVE mg/dL
Leukocytes, UA: NEGATIVE
NITRITE: NEGATIVE
Protein, ur: 100 mg/dL — AB
Specific Gravity, Urine: 1.016 (ref 1.005–1.030)
pH: 5 (ref 5.0–8.0)

## 2018-02-23 LAB — BLOOD GAS, ARTERIAL
ACID-BASE EXCESS: 22.8 mmol/L — AB (ref 0.0–2.0)
Bicarbonate: 53 mmol/L — ABNORMAL HIGH (ref 20.0–28.0)
FIO2: 0.36
O2 SAT: 86 %
PATIENT TEMPERATURE: 37
PCO2 ART: 96 mmHg — AB (ref 32.0–48.0)
PO2 ART: 54 mmHg — AB (ref 83.0–108.0)
pH, Arterial: 7.35 (ref 7.350–7.450)

## 2018-02-23 LAB — TROPONIN I: Troponin I: <0.03 ng/mL (ref <0.03)

## 2018-02-23 LAB — GLUCOSE, CAPILLARY: Glucose-Capillary: 164 mg/dL — ABNORMAL HIGH (ref 70–99)

## 2018-02-23 MED ORDER — SODIUM CHLORIDE 0.9 % IV SOLN
500.0000 mg | Freq: Once | INTRAVENOUS | Status: AC
  Administered 2018-02-23: 500 mg via INTRAVENOUS
  Filled 2018-02-23: qty 500

## 2018-02-23 MED ORDER — CITALOPRAM HYDROBROMIDE 20 MG PO TABS
40.0000 mg | ORAL_TABLET | Freq: Every day | ORAL | Status: DC
  Administered 2018-02-25 – 2018-02-28 (×4): 40 mg via ORAL
  Filled 2018-02-23 (×4): qty 2

## 2018-02-23 MED ORDER — ENOXAPARIN SODIUM 40 MG/0.4ML ~~LOC~~ SOLN
40.0000 mg | SUBCUTANEOUS | Status: DC
  Administered 2018-02-24: 40 mg via SUBCUTANEOUS
  Filled 2018-02-23: qty 0.4

## 2018-02-23 MED ORDER — METOPROLOL TARTRATE 25 MG PO TABS
12.5000 mg | ORAL_TABLET | Freq: Two times a day (BID) | ORAL | Status: DC
  Administered 2018-02-24 – 2018-02-28 (×7): 12.5 mg via ORAL
  Filled 2018-02-23 (×8): qty 1

## 2018-02-23 MED ORDER — METHYLPREDNISOLONE SODIUM SUCC 125 MG IJ SOLR
125.0000 mg | Freq: Once | INTRAMUSCULAR | Status: AC
  Administered 2018-02-23: 125 mg via INTRAVENOUS
  Filled 2018-02-23: qty 2

## 2018-02-23 MED ORDER — ACETAMINOPHEN 650 MG RE SUPP: 650.0000 mg | Freq: Four times a day (QID) | RECTAL | Status: DC | PRN

## 2018-02-23 MED ORDER — INSULIN ASPART 100 UNIT/ML ~~LOC~~ SOLN
0.0000 [IU] | Freq: Four times a day (QID) | SUBCUTANEOUS | Status: DC
  Administered 2018-02-23 – 2018-02-24 (×2): 2 [IU] via SUBCUTANEOUS
  Administered 2018-02-24: 3 [IU] via SUBCUTANEOUS
  Administered 2018-02-24 – 2018-02-25 (×3): 2 [IU] via SUBCUTANEOUS
  Administered 2018-02-25: 1 [IU] via SUBCUTANEOUS
  Administered 2018-02-25 – 2018-02-26 (×3): 2 [IU] via SUBCUTANEOUS
  Administered 2018-02-26: 7 [IU] via SUBCUTANEOUS
  Administered 2018-02-26 (×2): 2 [IU] via SUBCUTANEOUS
  Administered 2018-02-27 – 2018-02-28 (×2): 1 [IU] via SUBCUTANEOUS
  Filled 2018-02-23 (×15): qty 1

## 2018-02-23 MED ORDER — ORAL CARE MOUTH RINSE
15.0000 mL | Freq: Two times a day (BID) | OROMUCOSAL | Status: DC
  Administered 2018-02-24 – 2018-02-28 (×6): 15 mL via OROMUCOSAL

## 2018-02-23 MED ORDER — ONDANSETRON HCL 4 MG/2ML IJ SOLN: 4.0000 mg | Freq: Four times a day (QID) | INTRAMUSCULAR | Status: DC | PRN

## 2018-02-23 MED ORDER — LEVOTHYROXINE SODIUM 25 MCG PO TABS
125.0000 ug | ORAL_TABLET | Freq: Every day | ORAL | Status: DC
  Administered 2018-02-26 – 2018-02-28 (×3): 125 ug via ORAL
  Filled 2018-02-23 (×3): qty 1

## 2018-02-23 MED ORDER — ACETAMINOPHEN 325 MG PO TABS: 650.0000 mg | ORAL_TABLET | Freq: Four times a day (QID) | ORAL | Status: DC | PRN

## 2018-02-23 MED ORDER — ONDANSETRON HCL 4 MG PO TABS: 4.0000 mg | ORAL_TABLET | Freq: Four times a day (QID) | ORAL | Status: DC | PRN

## 2018-02-23 MED ORDER — FUROSEMIDE 20 MG PO TABS
20.0000 mg | ORAL_TABLET | Freq: Every day | ORAL | Status: DC
  Administered 2018-02-25 – 2018-02-28 (×4): 20 mg via ORAL
  Filled 2018-02-23 (×4): qty 1

## 2018-02-23 MED ORDER — TIOTROPIUM BROMIDE MONOHYDRATE 18 MCG IN CAPS
18.0000 ug | ORAL_CAPSULE | Freq: Every day | RESPIRATORY_TRACT | Status: DC
  Filled 2018-02-23: qty 5

## 2018-02-23 MED ORDER — SODIUM CHLORIDE 0.9 % IV SOLN
500.0000 mg | INTRAVENOUS | Status: DC
  Filled 2018-02-23: qty 500

## 2018-02-23 MED ORDER — GABAPENTIN 100 MG PO CAPS
200.0000 mg | ORAL_CAPSULE | Freq: Two times a day (BID) | ORAL | Status: DC
  Administered 2018-02-24 – 2018-02-28 (×8): 200 mg via ORAL
  Filled 2018-02-23 (×8): qty 2

## 2018-02-23 MED ORDER — IPRATROPIUM-ALBUTEROL 0.5-2.5 (3) MG/3ML IN SOLN: 3.0000 mL | RESPIRATORY_TRACT | Status: DC | PRN

## 2018-02-23 MED ORDER — IPRATROPIUM-ALBUTEROL 0.5-2.5 (3) MG/3ML IN SOLN
9.0000 mL | Freq: Once | RESPIRATORY_TRACT | Status: AC
  Administered 2018-02-23: 9 mL via RESPIRATORY_TRACT
  Filled 2018-02-23: qty 3
  Filled 2018-02-23: qty 9

## 2018-02-23 MED ORDER — CHLORHEXIDINE GLUCONATE 0.12 % MT SOLN
15.0000 mL | Freq: Two times a day (BID) | OROMUCOSAL | Status: DC
  Administered 2018-02-23 – 2018-02-28 (×9): 15 mL via OROMUCOSAL
  Filled 2018-02-23 (×7): qty 15

## 2018-02-23 MED ORDER — ALBUTEROL SULFATE (2.5 MG/3ML) 0.083% IN NEBU
7.5000 mg | INHALATION_SOLUTION | Freq: Once | RESPIRATORY_TRACT | Status: AC
  Administered 2018-02-23: 7.5 mg via RESPIRATORY_TRACT
  Filled 2018-02-23: qty 9

## 2018-02-23 MED ORDER — ROSUVASTATIN CALCIUM 10 MG PO TABS
40.0000 mg | ORAL_TABLET | Freq: Every evening | ORAL | Status: DC
  Administered 2018-02-24 – 2018-02-27 (×4): 40 mg via ORAL
  Filled 2018-02-23 (×4): qty 4

## 2018-02-23 MED ORDER — PANTOPRAZOLE SODIUM 40 MG PO TBEC
40.0000 mg | DELAYED_RELEASE_TABLET | Freq: Every day | ORAL | Status: DC
  Administered 2018-02-25 – 2018-02-28 (×4): 40 mg via ORAL
  Filled 2018-02-23 (×4): qty 1

## 2018-02-23 MED ORDER — MOMETASONE FURO-FORMOTEROL FUM 100-5 MCG/ACT IN AERO
2.0000 | INHALATION_SPRAY | Freq: Two times a day (BID) | RESPIRATORY_TRACT | Status: DC
  Administered 2018-02-24 – 2018-02-28 (×8): 2 via RESPIRATORY_TRACT
  Filled 2018-02-23: qty 8.8

## 2018-02-23 MED ORDER — METHYLPREDNISOLONE SODIUM SUCC 125 MG IJ SOLR
60.0000 mg | Freq: Four times a day (QID) | INTRAMUSCULAR | Status: DC
  Administered 2018-02-23 – 2018-02-24 (×2): 60 mg via INTRAVENOUS
  Filled 2018-02-23 (×2): qty 2

## 2018-02-23 MED ORDER — IPRATROPIUM-ALBUTEROL 0.5-2.5 (3) MG/3ML IN SOLN
3.0000 mL | Freq: Four times a day (QID) | RESPIRATORY_TRACT | Status: DC
  Administered 2018-02-24 – 2018-02-26 (×10): 3 mL via RESPIRATORY_TRACT
  Filled 2018-02-23 (×11): qty 3

## 2018-02-23 NOTE — H&P (Signed)
[facility] [place] at [facility]   PATIENT NAME: Sheila Hayes    MR#:  [id number]  DATE OF BIRTH:  August 01, 1940  DATE OF ADMISSION:  02/23/2018  PRIMARY CARE PHYSICIAN: [facility]   REQUESTING/REFERRING PHYSICIAN: Pershing Proud, MD  CHIEF COMPLAINT:   Chief Complaint  Patient presents with  . Altered Mental Status    HISTORY OF PRESENT ILLNESS:  Sheila Hayes  is a 77 y.o. female who presents with chief complaint as above.  Patient is unable to contribute to her HPI due to being very lethargic.  She is able to wake up to verbal and tactile stimuli, but does not carry on any meaningful conversation.  Here in the ED she is found to be very hypercapnic.  Looking back on chart review it seems like she is likely somewhat chronically compensated due to her COPD, though her CO2 level tonight is more elevated than historically typical for her.  She was placed on BiPAP, and hospitalist were called for admission.  PAST MEDICAL HISTORY:   Past Medical History:  Diagnosis Date  . Chronic diastolic congestive heart failure (HCC)   . COPD (chronic obstructive pulmonary disease) (HCC)   . Diabetes mellitus without complication (HCC)   . Hyperlipidemia   . Hypertension   . Hypothyroidism   . Morbid obesity (HCC)   . Pulmonary hypertension (HCC)      PAST SURGICAL HISTORY:   Past Surgical History:  Procedure Laterality Date  . APPENDECTOMY    . CHOLECYSTECTOMY       SOCIAL HISTORY:   Social History   Tobacco Use  . Smoking status: Former Games developer  . Smokeless tobacco: Never Used  Substance Use Topics  . Alcohol use: No     FAMILY HISTORY:   Family History  Problem Relation Age of Onset  . Heart attack Mother   . Heart attack Father   . Diabetes Sister   . Hypertension Sister   . Hypertension Brother   . Diabetes Brother   . Lung cancer Brother   . Colon cancer Brother      DRUG ALLERGIES:  No Known  Allergies  MEDICATIONS AT HOME:   Prior to Admission medications   Medication Sig Start Date End Date Taking? Authorizing Provider  acetaminophen (TYLENOL) 500 MG tablet Take 500 mg by mouth every 6 (six) hours as needed for headache.   Yes [provider]  albuterol (PROVENTIL HFA;VENTOLIN HFA) 108 (90 Base) MCG/ACT inhaler Inhale 2 puffs into the lungs every 6 (six) hours as needed for wheezing or shortness of breath.   Yes [provider]  budesonide-formoterol (SYMBICORT) 80-4.5 MCG/ACT inhaler Inhale 2 puffs into the lungs 2 (two) times daily.   Yes [provider]  calcium carbonate (OSCAL) 1500 (600 Ca) MG TABS tablet Take 600 mg of elemental calcium by mouth 2 (two) times daily with a meal.    Yes [provider]  citalopram (CELEXA) 40 MG tablet Take 40 mg by mouth daily.   Yes [provider]  docusate sodium (COLACE) 100 MG capsule Take 200 mg by mouth at bedtime.   Yes [provider]  furosemide (LASIX) 20 MG tablet Take 20 mg by mouth daily.   Yes [provider]  gabapentin (NEURONTIN) 100 MG capsule Take 200 mg by mouth 2 (two) times daily.   Yes [provider]  glipiZIDE (GLUCOTROL) 10 MG tablet Take 10 mg by mouth daily before breakfast.   Yes [provider]  levothyroxine (SYNTHROID, LEVOTHROID) 125 MCG tablet Take 125 mcg by mouth daily before breakfast.   Yes [provider]  metFORMIN (GLUCOPHAGE) 500 MG tablet Take 1,000 mg by mouth 2 (two) times daily with a meal.    Yes [provider]  metoprolol tartrate (LOPRESSOR) 25 MG tablet Take 0.5 tablets (12.5 mg total) by mouth 2 (two) times daily. Patient taking differently: Take 12.5 mg by mouth 2 (two) times daily. HOLD for SBP <100 or HR <60 05/20/16  Yes Shaune Pollack, MD  nystatin (NYSTATIN) powder Apply topically 2 (two) times daily as needed. For fungal growth   Yes [provider]  omeprazole (PRILOSEC) 20 MG  capsule Take 20 mg by mouth daily.   Yes [provider]  potassium chloride (K-DUR) 10 MEQ tablet Take 10 mEq by mouth daily.   Yes [provider]  rosuvastatin (CRESTOR) 40 MG tablet Take 40 mg by mouth every evening.    Yes [provider]  tiotropium (SPIRIVA) 18 MCG inhalation capsule Place 18 mcg into inhaler and inhale daily.   Yes [provider]  predniSONE (DELTASONE) 10 MG tablet Take 1 tablet (10 mg total) by mouth daily. Label  & dispense according to the schedule below.  6 tablets day one, then 5 table day 2, then 4 tablets day 3, then 3 tablets day 4, 2 tablets day 5, then 1 tablet day 6, then stop Patient not taking: Reported on 02/23/2018 02/02/18   Ihor Austin, MD    REVIEW OF SYSTEMS:  Review of Systems  Unable to perform ROS: Acuity of condition     VITAL SIGNS:   Vitals:   02/23/18 1916 02/23/18 1930 02/23/18 2022 02/23/18 2024  BP:  115/64 103/69 103/69  Pulse:  (!) 111 (!) 103 (!) 102  Resp:  20 (!) 21 20  Temp: 98.5 F (36.9 C)     TempSrc: Oral     SpO2:  (!) 89% 97% 96%   Wt Readings from Last 3 Encounters:  01/30/18 110.3 kg  12/28/17 114.6 kg  10/09/17 111.1 kg    PHYSICAL EXAMINATION:  Physical Exam  Vitals reviewed. Constitutional: She appears well-developed and well-nourished. No distress.  HENT:  Head: Normocephalic and atraumatic.  Mouth/Throat: Oropharynx is clear and moist.  Eyes: Pupils are equal, round, and reactive to light. Conjunctivae and EOM are normal. No scleral icterus.  Neck: Normal range of motion. Neck supple. No JVD present. No thyromegaly present.  Cardiovascular: Normal rate, regular rhythm and intact distal pulses. Exam reveals no gallop and no friction rub.  No murmur heard. Respiratory: Effort normal. No respiratory distress. She has wheezes. She has no rales.  GI: Soft. Bowel sounds are normal. She exhibits no distension. There is no tenderness.  Musculoskeletal: Normal range of  motion. She exhibits no edema.  No arthritis, no gout  Lymphadenopathy:    She has no cervical adenopathy.  Neurological:  Unable to assess due to patient condition  Skin: Skin is warm and dry. No rash noted. No erythema.  Psychiatric:  Unable to assess due to patient condition    LABORATORY PANEL:   CBC Recent Labs  Lab 02/23/18 1800  WBC 6.3  HGB 10.6*  HCT 32.2*  PLT 177   ------------------------------------------------------------------------------------------------------------------  Chemistries  Recent Labs  Lab 02/23/18 1800  NA 142  K 3.7  CL 90*  CO2 43*  GLUCOSE 89  BUN 11  CREATININE 0.72  CALCIUM 8.8*  AST 12*  ALT 9  ALKPHOS  43  BILITOT 0.7   ------------------------------------------------------------------------------------------------------------------  Cardiac Enzymes Recent Labs  Lab 02/23/18 1800  TROPONINI <0.03   ------------------------------------------------------------------------------------------------------------------  RADIOLOGY:  Dg Chest 1 View  Result Date: 02/23/2018 CLINICAL DATA:  Shortness of breath and wheezing EXAM: CHEST  1 VIEW COMPARISON:  01/30/2018 FINDINGS: Unchanged cardiomegaly. There is mild interstitial pulmonary edema. No focal airspace consolidation. No pleural effusion or pneumothorax. IMPRESSION: Cardiomegaly and mild interstitial pulmonary edema. Electronically Signed   By: Deatra Robinson M.D.   On: 02/23/2018 18:54   Ct Head Wo Contrast  Result Date: 02/23/2018 CLINICAL DATA:  77 year old female with altered mental status. Hypoxia. EXAM: CT HEAD WITHOUT CONTRAST TECHNIQUE: Contiguous axial images were obtained from the base of the skull through the vertex without intravenous contrast. COMPARISON:  Head CT without contrast 01/30/2018 and earlier. FINDINGS: Brain: Small right vertex dural calcification versus small 8 millimeter calcified meningioma, stable since 2018. No associated mass effect or edema. Normal  for age cerebral volume and gray-white matter differentiation. No midline shift, ventriculomegaly, mass effect, intracranial hemorrhage or evidence of cortically based acute infarction. Vascular: Mild Calcified atherosclerosis at the skull base. No suspicious intracranial vascular hyperdensity. Skull: No acute osseous abnormality identified. Hyperostosis frontalis (normal variant). Sinuses/Orbits: Visualized paranasal sinuses and mastoids are stable and well pneumatized. Other: No acute orbit or scalp soft tissue findings. 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 Electronically Signed   By: Odessa Fleming M.D.   On: 02/23/2018 18:55    EKG:   Orders placed or performed during the hospital encounter of 02/23/18  . ED EKG  . ED EKG    IMPRESSION AND PLAN:  Principal Problem:   Acute respiratory failure with hypoxia and hypercapnia (HCC) -likely related to her COPD exacerbation.  We will keep her on BiPAP for now, repeat ABG in the morning, other treatment as below Active Problems:   COPD exacerbation (HCC) -IV Solu-Medrol, antibiotics, duo nebs, PRN supportive treatment   Altered mental status -likely related to her hypercapnic respiratory failure, watch for improvement with treatment as above   Acute on chronic diastolic CHF (congestive heart failure) (HCC) -continue home meds, 1 dose IV Lasix tonight   HTN (hypertension) -continue home medications   Diabetes (HCC) -sliding scale insulin with corresponding glucose checks   HLD (hyperlipidemia) -Home dose antilipid   Hypothyroidism -home dose thyroid replacement  Chart review performed and case discussed with ED provider. Labs, imaging and/or ECG reviewed by provider and discussed with patient/family. Management plans discussed with the patient and/or family.  DVT PROPHYLAXIS: SubQ lovenox   GI PROPHYLAXIS:  PPI   ADMISSION STATUS:  Inpatient     CODE STATUS: Full Code Status History    Date Active Date Inactive Code Status Order ID Comments User Context   01/30/2018 2146 02/02/2018 1759 Full Code [id number]  Campbell Stall, MD Inpatient   10/09/2017 2206 10/11/2017 1928 Full Code [id number]  Oralia Manis, MD Inpatient   05/13/2016 1117 05/20/2016 2022 Full Code [id number]  Eugenie Norrie, NP ED      TOTAL TIME TAKING CARE OF THIS PATIENT: 45 minutes.   Rumaldo Difatta FIELDING 02/23/2018, 9:05 PM  [employer]  [phone]  CC: Primary care physician; [facility]  Note:  This document was prepared using Dragon voice recognition software and may include unintentional dictation errors.

## 2018-02-23 NOTE — ED Notes (Signed)
Marcelino Duster, RN given report.

## 2018-02-23 NOTE — ED Notes (Signed)
Respiratory called for ABG

## 2018-02-23 NOTE — Consult Note (Addendum)
Name: Sheila Hayes MRN: [id number] DOB: 08/19/1940    ADMISSION DATE:  02/23/2018 CONSULTATION DATE:  02/23/2018  REFERRING MD :  Dr. Anne Hahn  CHIEF COMPLAINT:  Altered Mental Status, Hypoxia   BRIEF PATIENT DESCRIPTION:  77 y.o. Female admitted with AMS and Acute Hypoxic Hypercapnic Respiratory Failure requiring BiPAP in setting of AECOPD and Acute on Chronic HFpEF.  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  HISTORY OF PRESENT ILLNESS:   Sheila Hayes is a 77 y.o. Female with a PMH as listed below who presents to [facility] ED on 02/23/18 with Altered Mental status.  Pt is currently very lethargic with no family present, therefore history is obtained from ED and nursing notes.  When EMS found pt, she was hypoxic with O2 sats 82% on her baseline 2L O2 Nasal cannula. Pt with recent UTI of which she was treated with Keflex.  Pt wasplaced on Bipap.  Initial evaluation in ED revealed ABG w/ pH 7.35 / pCO2 96 / pO2 54 / bicarb 53, Troponin negative, WBC 6.3.  CT head negative for any acute abnormality, and CXR is concerning for cardiomegaly with mild interstitial pulmonary edema.  She is being admitted to [facility] Stepdown unit for treatment of AMS and Acute Hypoxic Hypercapnic Respiratory Failure requiring BiPAP in the setting of AECOPD and Acute on Chronic HFpEF.  PCCM is consulted for further management.  PAST MEDICAL HISTORY :   has a past medical history of Chronic diastolic congestive heart failure (HCC), COPD (chronic obstructive pulmonary disease) (HCC), Diabetes mellitus without complication (HCC), Hyperlipidemia, Hypertension, Hypothyroidism, Morbid obesity (HCC), and Pulmonary hypertension (HCC).  has a past surgical history that includes Cholecystectomy and Appendectomy. Prior to Admission  medications   Medication Sig Start Date End Date Taking? Authorizing Provider  acetaminophen (TYLENOL) 500 MG tablet Take 500 mg by mouth every 6 (six) hours as needed for headache.   Yes [provider]  albuterol (PROVENTIL HFA;VENTOLIN HFA) 108 (90 Base) MCG/ACT inhaler Inhale 2 puffs into the lungs every 6 (six) hours as needed for wheezing or shortness of breath.   Yes [provider]  budesonide-formoterol (SYMBICORT) 80-4.5 MCG/ACT inhaler Inhale 2 puffs into the lungs 2 (two) times daily.   Yes [provider]  calcium carbonate (OSCAL) 1500 (600 Ca) MG TABS tablet Take 600 mg of elemental calcium by mouth 2 (two) times daily with a meal.    Yes [provider]  citalopram (CELEXA) 40 MG tablet Take 40 mg by mouth daily.   Yes [provider]  docusate sodium (COLACE) 100 MG capsule Take 200 mg by mouth at bedtime.   Yes [provider]  furosemide (LASIX) 20 MG tablet Take 20 mg by mouth daily.   Yes [provider]  gabapentin (NEURONTIN) 100 MG capsule Take 200 mg by mouth 2 (two) times daily.   Yes [provider]  glipiZIDE (GLUCOTROL) 10 MG tablet Take 10 mg by mouth daily before breakfast.   Yes [provider]  levothyroxine (SYNTHROID, LEVOTHROID) 125 MCG tablet Take 125 mcg by mouth daily before breakfast.   Yes [provider]  metFORMIN (GLUCOPHAGE) 500 MG tablet Take 1,000 mg by mouth 2 (two) times daily with a meal.    Yes [provider]  metoprolol tartrate (LOPRESSOR) 25 MG tablet Take 0.5 tablets (12.5  mg total) by mouth 2 (two) times daily. Patient taking differently: Take 12.5 mg by mouth 2 (two) times daily. HOLD for SBP <100 or HR <60 05/20/16  Yes Shaune Pollack, MD  nystatin (NYSTATIN) powder Apply topically 2 (two) times daily as needed. For fungal growth   Yes [provider]  omeprazole (PRILOSEC) 20 MG capsule Take 20 mg by mouth daily.   Yes [provider]  potassium chloride (K-DUR) 10 MEQ tablet Take 10 mEq by mouth daily.   Yes [provider]  rosuvastatin (CRESTOR) 40 MG tablet Take 40 mg by mouth every evening.    Yes [provider]  tiotropium (SPIRIVA) 18 MCG inhalation capsule Place 18 mcg into inhaler and inhale daily.   Yes [provider]  predniSONE (DELTASONE) 10 MG tablet Take 1 tablet (10 mg total) by mouth daily. Label  & dispense according to the schedule below.  6 tablets day one, then 5 table day 2, then 4 tablets day 3, then 3 tablets day 4, 2 tablets day 5, then 1 tablet day 6, then stop Patient not taking: Reported on 02/23/2018 02/02/18   Ihor Austin, MD   No Known Allergies  FAMILY HISTORY:  family history includes Colon cancer in her brother; Diabetes in her brother and sister; Heart attack in her father and mother; Hypertension in her brother and sister; Lung cancer in her brother. SOCIAL HISTORY:  reports that she has quit smoking. She has never used smokeless tobacco. She reports that she does not drink alcohol or use drugs.  REVIEW OF SYSTEMS:  Unable to obtain due to AMS  SUBJECTIVE:  Unable to obtain due to AMS  VITAL SIGNS: Temp:  [98.5 F (36.9 C)] 98.5 F (36.9 C) (09/04 1916) Pulse Rate:  [87-111] 101 (09/04 2100) Resp:  [18-23] 21 (09/04 2100) BP: (95-120)/(55-69) 117/66 (09/04 2100) SpO2:  [88 %-97 %] 96 % (09/04 2100)  PHYSICAL EXAMINATION: General:  Acutely ill appearing female, laying in bed, on BiPAP, in NAD Neuro:  Lethargic, arouses to voice and follows simple commands, no focal deficits HEENT:  Atraumatic, normocephalic, neck supple, no JVD Cardiovascular:  Tachycardia, Regular rhythm, s1s2, no M/R/G, 2+ pulses throughout Lungs:  Clear diminished bilaterally, no wheezing, even, nonlabored, BiPAP assisted Abdomen:  Obese, soft, nontender, Hypoactive BS Musculoskeletal:  No deformities, 1+ edema bilateral LE Skin:  No obvious rashes, lesion, or  ulcerations  Recent Labs  Lab 02/23/18 1800  NA 142  K 3.7  CL 90*  CO2 43*  BUN 11  CREATININE 0.72  GLUCOSE 89   Recent Labs  Lab 02/23/18 1800  HGB 10.6*  HCT 32.2*  WBC 6.3  PLT 177   Dg Chest 1 View  Result Date: 02/23/2018 CLINICAL DATA:  Shortness of breath and wheezing EXAM: CHEST  1 VIEW COMPARISON:  01/30/2018 FINDINGS: Unchanged cardiomegaly. There is mild interstitial pulmonary edema. No focal airspace consolidation. No pleural effusion or pneumothorax. IMPRESSION: Cardiomegaly and mild interstitial pulmonary edema. Electronically Signed   By: Deatra Robinson M.D.   On: 02/23/2018 18:54   Ct Head Wo Contrast  Result Date: 02/23/2018 CLINICAL DATA:  77 year old female with altered mental status. Hypoxia. EXAM: CT HEAD WITHOUT CONTRAST TECHNIQUE: Contiguous axial images were obtained from the base of the skull through the vertex without intravenous contrast. COMPARISON:  Head CT without contrast 01/30/2018 and earlier. FINDINGS: Brain: Small right vertex dural calcification versus small 8 millimeter calcified meningioma, stable since 2018. No associated mass effect or edema. Normal  for age cerebral volume and gray-white matter differentiation. No midline shift, ventriculomegaly, mass effect, intracranial hemorrhage or evidence of cortically based acute infarction. Vascular: Mild Calcified atherosclerosis at the skull base. No suspicious intracranial vascular hyperdensity. Skull: No acute osseous abnormality identified. Hyperostosis frontalis (normal variant). Sinuses/Orbits: Visualized paranasal sinuses and mastoids are stable and well pneumatized. Other: No acute orbit or scalp soft tissue findings. 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 Electronically Signed   By: Odessa Fleming M.D.   On: 02/23/2018 18:55    ASSESSMENT / PLAN:  A: AMS in setting of Hypercapnia -CT  Head negative for acute process  Acute Hypoxic Hypercapnic Respiratory Failure in setting of AECOPD Hx: Pulmonary HTN, morbid obesity, HTN, HLD, DM II, COPD, Chronic HFpEF, recent UTI ECHO 09/2017 LVEF 55-60% P: Supplemental O2 to maintain O2 sats 88 to 94% BiPAP, wean as tolerated Follow up ABG Scheduled Bronchodilators IV Steroids Continue Dulera & Spiriva PO Lasix 20 mg daily Received 1 dose Azithromycin in ED 9/4, will give 3 additional doses (500 mg IV q 24h) Urine culture pending Obtain Procalcitonin Provide supportive care Avoid sedating meds as able Lights on during the day Promote normal wake/sleep cycle   Disposition: Stepdown Goals of care: Full code VTE prophylaxis: Lovenox Updates: Updated pt at bedside 02/23/18.  No family present during NP rounds.   Harlon Ditty, AGACNP-BC [facility] Pager: [phone]   02/23/2018, 9:49 PM   Hypothyroid -Optimize Levothyroxine and monitor Free T4  MRSA PCR positive -Mupirocin cream -Consider vancomycin if worsening chest infiltrate or developing SIRS

## 2018-02-23 NOTE — ED Provider Notes (Addendum)
[facility] Emergency Department Provider Note ____________________________________________   First MD Initiated Contact with Patient 02/23/18 1755     (approximate)  I have reviewed the triage vital signs and the nursing notes.   HISTORY  Chief Complaint Altered Mental Status  HPI Sheila Hayes is a 77 y.o. female with a history of CHF, COPD, hypertension and recent urinary tract infection was presenting with altered mental status.  EMS called out for decreased mentation and found the patient to be 82% on her baseline 2 L nasal cannula oxygen.  The increase her oxygen to 5 L of nasal cannula flow and the patient improved to 96%.  Patient denying any pain.  Denying any shortness of breath.  Past Medical History:  Diagnosis Date  . Chronic diastolic congestive heart failure (HCC)   . COPD (chronic obstructive pulmonary disease) (HCC)   . Diabetes mellitus without complication (HCC)   . Hyperlipidemia   . Hypertension   . Hypothyroidism   . Morbid obesity (HCC)   . Pulmonary hypertension [facility])     Patient Active Problem List   Diagnosis Date Noted  . Altered mental status   . Urinary tract infection without hematuria   . Acute respiratory failure with hypoxia and hypercapnia (HCC) 01/30/2018  . Near syncope 10/09/2017  . HTN (hypertension) 10/09/2017  . Diabetes (HCC) 10/09/2017  . HLD (hyperlipidemia) 10/09/2017  . Chronic diastolic CHF (congestive heart failure) (HCC) 10/09/2017  . Hypothyroidism 10/09/2017  . COPD exacerbation (HCC) 05/13/2016    Past Surgical History:  Procedure Laterality Date  . APPENDECTOMY    . CHOLECYSTECTOMY      Prior to Admission medications   Medication Sig Start Date End Date Taking? Authorizing Provider  acetaminophen (TYLENOL) 500 MG tablet Take 500 mg by mouth every 6 (six) hours as needed for headache.   Yes [provider]  albuterol (PROVENTIL HFA;VENTOLIN HFA) 108 (90 Base) MCG/ACT inhaler  Inhale 2 puffs into the lungs every 6 (six) hours as needed for wheezing or shortness of breath.   Yes [provider]  budesonide-formoterol (SYMBICORT) 80-4.5 MCG/ACT inhaler Inhale 2 puffs into the lungs 2 (two) times daily.   Yes [provider]  calcium carbonate (OSCAL) 1500 (600 Ca) MG TABS tablet Take 600 mg of elemental calcium by mouth 2 (two) times daily with a meal.    Yes [provider]  citalopram (CELEXA) 40 MG tablet Take 40 mg by mouth daily.   Yes [provider]  docusate sodium (COLACE) 100 MG capsule Take 200 mg by mouth at bedtime.   Yes [provider]  furosemide (LASIX) 20 MG tablet Take 20 mg by mouth daily.   Yes [provider]  gabapentin (NEURONTIN) 100 MG capsule Take 200 mg by mouth 2 (two) times daily.   Yes [provider]  glipiZIDE (GLUCOTROL) 10 MG tablet Take 10 mg by mouth daily before breakfast.   Yes [provider]  levothyroxine (SYNTHROID, LEVOTHROID) 125 MCG tablet Take 125 mcg by mouth daily before breakfast.   Yes [provider]  metFORMIN (GLUCOPHAGE) 500 MG tablet Take 1,000 mg by mouth 2 (two) times daily with a meal.    Yes [provider]  metoprolol tartrate (LOPRESSOR) 25 MG tablet Take 0.5 tablets (12.5 mg total) by mouth 2 (two) times daily. Patient taking differently: Take 12.5 mg by mouth 2 (two) times daily. HOLD for SBP <100 or HR <60 05/20/16  Yes Shaune Pollack, MD  nystatin (  NYSTATIN) powder Apply topically 2 (two) times daily as needed. For fungal growth   Yes [provider]  omeprazole (PRILOSEC) 20 MG capsule Take 20 mg by mouth daily.   Yes [provider]  potassium chloride (K-DUR) 10 MEQ tablet Take 10 mEq by mouth daily.   Yes [provider]  rosuvastatin (CRESTOR) 40 MG tablet Take 40 mg by mouth every evening.    Yes [provider]  tiotropium (SPIRIVA) 18 MCG inhalation capsule Place 18 mcg into inhaler  and inhale daily.   Yes [provider]  predniSONE (DELTASONE) 10 MG tablet Take 1 tablet (10 mg total) by mouth daily. Label  & dispense according to the schedule below.  6 tablets day one, then 5 table day 2, then 4 tablets day 3, then 3 tablets day 4, 2 tablets day 5, then 1 tablet day 6, then stop Patient not taking: Reported on 02/23/2018 02/02/18   Ihor Austin, MD    Allergies Patient has no known allergies.  Family History  Problem Relation Age of Onset  . Heart attack Mother   . Heart attack Father   . Diabetes Sister   . Hypertension Sister   . Hypertension Brother   . Diabetes Brother   . Lung cancer Brother   . Colon cancer Brother     Social History Social History   Tobacco Use  . Smoking status: Former Games developer  . Smokeless tobacco: Never Used  Substance Use Topics  . Alcohol use: No  . Drug use: No    Review of Systems  Constitutional: No fever/chills Eyes: No visual changes. ENT: No sore throat. Cardiovascular: Denies chest pain. Respiratory: Denies shortness of breath. Gastrointestinal: No abdominal pain.  No nausea, no vomiting.  No diarrhea.  No constipation. Genitourinary: Negative for dysuria. Musculoskeletal: Negative for back pain. Skin: Negative for rash. Neurological: Negative for headaches, focal weakness or numbness.   ____________________________________________   PHYSICAL EXAM:  VITAL SIGNS: ED Triage Vitals [02/23/18 1758]  Enc Vitals Group     BP (!) 120/55     Pulse Rate 87     Resp 18     Temp      Temp src      SpO2 94 %     Weight      Height      Head Circumference      Peak Flow      Pain Score      Pain Loc      Pain Edu?      Excl. in GC?     Constitutional: Alert and oriented to self as well as location and birthdate.  Not oriented to year.  Despite patient's orientation she does appear weak and is slightly slow to respond. Eyes: Conjunctivae are normal.  Head: Atraumatic. Nose: No  congestion/rhinnorhea. Mouth/Throat: Mucous membranes are moist.  Neck: No stridor.   Cardiovascular: Normal rate, regular rhythm. Grossly normal heart sounds.  Respiratory: Normal respiratory effort.  No retractions.  Diffuse wheezing with prolonged expiratory phase.   Gastrointestinal: Soft and nontender. No distention. Musculoskeletal: No lower extremity tenderness nor edema.  No joint effusions. Neurologic:  Normal speech and language. No gross focal neurologic deficits are appreciated. Skin:  Skin is warm, dry and intact. No rash noted. Psychiatric: Mood and affect are normal. Speech and behavior are normal.  ____________________________________________   LABS (all labs ordered are listed, but only abnormal results are displayed)  Labs Reviewed  CBC WITH DIFFERENTIAL/PLATELET - Abnormal;  Notable for the following components:      Result Value   RBC 3.65 (*)    Hemoglobin 10.6 (*)    HCT 32.2 (*)    All other components within normal limits  COMPREHENSIVE METABOLIC PANEL - Abnormal; Notable for the following components:   Chloride 90 (*)    CO2 43 (*)    Calcium 8.8 (*)    Albumin 3.4 (*)    AST 12 (*)    All other components within normal limits  URINE CULTURE  TROPONIN I  URINALYSIS, COMPLETE (UACMP) WITH MICROSCOPIC   ____________________________________________  EKG  ED ECG REPORT I, Arelia Longest, the attending physician, personally viewed and interpreted this ECG.   Date: 02/23/2018  EKG Time: 1819  Rate: 89  Rhythm: normal sinus rhythm  Axis: Normal  Intervals:right bundle branch block  ST&T Change: No ST segment elevation or depression.  T wave inversions in V2 and V3. No significant change from previous. ____________________________________________  RADIOLOGY  Noncontrast CT without acute process.  Mild interstitial pulmonary edema on the chest x-ray ____________________________________________   PROCEDURES  Procedure(s) performed:    .Critical Care Performed by: Myrna Blazer, MD Authorized by: Myrna Blazer, MD   Critical care provider statement:    Critical care time (minutes):  35   Critical care time was exclusive of:  Separately billable procedures and treating other patients   Critical care was necessary to treat or prevent imminent or life-threatening deterioration of the following conditions:  Respiratory failure   Critical care was time spent personally by me on the following activities:  Development of treatment plan with patient or surrogate, discussions with consultants, evaluation of patient's response to treatment, examination of patient, obtaining history from patient or surrogate, ordering and performing treatments and interventions, ordering and review of laboratory studies, ordering and review of radiographic studies, pulse oximetry, re-evaluation of patient's condition and review of old charts    Critical Care performed:   ____________________________________________   INITIAL IMPRESSION / ASSESSMENT AND PLAN / ED COURSE  Pertinent labs & imaging results that were available during my care of the patient were reviewed by me and considered in my medical decision making (see chart for details).  Differential diagnosis includes, but is not limited to, alcohol, illicit or prescription medications, or other toxic ingestion; intracranial pathology such as stroke or intracerebral hemorrhage; fever or infectious causes including sepsis; hypoxemia and/or hypercarbia; uremia; trauma; endocrine related disorders such as diabetes, hypoglycemia, and thyroid-related diseases; hypertensive encephalopathy; etc. Differential includes, but is not limited to, viral syndrome, bronchitis including COPD exacerbation, pneumonia, reactive airway disease including asthma, CHF including exacerbation with or without pulmonary/interstitial edema, pneumothorax, ACS, thoracic trauma, and pulmonary  embolism. As part of my medical decision making, I reviewed the following data within the electronic MEDICAL RECORD NUMBER Notes from prior ED visits  ----------------------------------------- 7:48 PM on 02/23/2018 -----------------------------------------  Patient at this time still with slightly decreased mentation.  However, she is awake and alert when asked questions.  Nurse performing cath urine specimen.  Prior to this, I auscultated the lungs again and she is still wheezing with decreased air movement.  Will be admitted to the hospital for COPD exacerbation.  We will add azithromycin.  Pending urinalysis.  Signed out to Dr. Cherlynn Kaiser. ____________________________________________   FINAL CLINICAL IMPRESSION(S) / ED DIAGNOSES  AMS.  COPD exacerbation.  NEW MEDICATIONS STARTED DURING THIS VISIT:  New Prescriptions   No medications on file     Note:  This document was prepared using Dragon voice recognition software and may include unintentional dictation errors.     Myrna Blazer, MD 02/23/18 1948  ABG returned with PCO2 of 96 but with pH of 7.35.  Patient still responding and is oriented.  We will try a course of BiPAP to see if this improves her mentation.  Dr. Cherlynn Kaiser aware.  Patient aware.  Will also run albuterol through the BiPAP circuit.       Myrna Blazer, MD 02/23/18 2010

## 2018-02-23 NOTE — ED Notes (Signed)
Denise, from [facility], updated on pts admission status.

## 2018-02-23 NOTE — ED Notes (Signed)
Pt transported to ICU02 with this nurse and respiratory.

## 2018-02-23 NOTE — ED Notes (Signed)
Kayla RN, aware of bed assigned

## 2018-02-23 NOTE — ED Notes (Signed)
Attempted to give report, nurse will call back shortly for report.

## 2018-02-23 NOTE — ED Triage Notes (Signed)
Pt arrived via [facility] from [facility] for altered mental status. EMS reports 82% on 3L 02 that the patient normally wears at home. On 5L 02 EMS able to get patient to 96%. 67 BGL.

## 2018-02-24 LAB — BLOOD GAS, ARTERIAL
ACID-BASE EXCESS: 23.4 mmol/L — AB (ref 0.0–2.0)
Acid-Base Excess: 20.6 mmol/L — ABNORMAL HIGH (ref 0.0–2.0)
BICARBONATE: 52.6 mmol/L — AB (ref 20.0–28.0)
Bicarbonate: 51.5 mmol/L — ABNORMAL HIGH (ref 20.0–28.0)
DELIVERY SYSTEMS: POSITIVE
Delivery systems: POSITIVE
Expiratory PAP: 6
FIO2: 0.45
FIO2: 0.45
Inspiratory PAP: 16
MODE: POSITIVE
O2 SAT: 96.5 %
O2 Saturation: 94.9 %
PATIENT TEMPERATURE: 37
PCO2 ART: 89 mmHg — AB (ref 32.0–48.0)
PH ART: 7.32 — AB (ref 7.350–7.450)
PH ART: 7.38 (ref 7.350–7.450)
PO2 ART: 87 mmHg (ref 83.0–108.0)
Patient temperature: 37
pCO2 arterial: 100 mmHg (ref 32.0–48.0)
pO2, Arterial: 81 mmHg — ABNORMAL LOW (ref 83.0–108.0)

## 2018-02-24 LAB — PROCALCITONIN
Procalcitonin: <0.1 ng/mL
Procalcitonin: <0.1 ng/mL

## 2018-02-24 LAB — T4, FREE: FREE T4: 1.02 ng/dL (ref 0.82–1.77)

## 2018-02-24 LAB — BASIC METABOLIC PANEL
Anion gap: 12 (ref 5–15)
BUN: 16 mg/dL (ref 8–23)
CHLORIDE: 89 mmol/L — AB (ref 98–111)
CO2: 40 mmol/L — ABNORMAL HIGH (ref 22–32)
CREATININE: 0.84 mg/dL (ref 0.44–1.00)
Calcium: 8.6 mg/dL — ABNORMAL LOW (ref 8.9–10.3)
GFR calc Af Amer: >60 mL/min (ref >60)
GLUCOSE: 230 mg/dL — AB (ref 70–99)
Potassium: 3.8 mmol/L (ref 3.5–5.1)
SODIUM: 141 mmol/L (ref 135–145)

## 2018-02-24 LAB — CBC
HCT: 32.2 % — ABNORMAL LOW (ref 35.0–47.0)
Hemoglobin: 10.5 g/dL — ABNORMAL LOW (ref 12.0–16.0)
MCH: 29 pg (ref 26.0–34.0)
MCHC: 32.4 g/dL (ref 32.0–36.0)
MCV: 89.4 fL (ref 80.0–100.0)
PLATELETS: 173 10*3/uL (ref 150–440)
RBC: 3.6 MIL/uL — ABNORMAL LOW (ref 3.80–5.20)
RDW: 14.6 % — AB (ref 11.5–14.5)
WBC: 5.4 10*3/uL (ref 3.6–11.0)

## 2018-02-24 LAB — MRSA PCR SCREENING: MRSA BY PCR: POSITIVE — AB

## 2018-02-24 LAB — GLUCOSE, CAPILLARY
GLUCOSE-CAPILLARY: 211 mg/dL — AB (ref 70–99)
Glucose-Capillary: 196 mg/dL — ABNORMAL HIGH (ref 70–99)
Glucose-Capillary: 168 mg/dL — ABNORMAL HIGH (ref 70–99)

## 2018-02-24 MED ORDER — TIOTROPIUM BROMIDE MONOHYDRATE 18 MCG IN CAPS
18.0000 ug | ORAL_CAPSULE | Freq: Every day | RESPIRATORY_TRACT | Status: DC
  Administered 2018-02-27 – 2018-02-28 (×2): 18 ug via RESPIRATORY_TRACT
  Filled 2018-02-24: qty 5

## 2018-02-24 MED ORDER — METHYLPREDNISOLONE SODIUM SUCC 40 MG IJ SOLR
40.0000 mg | Freq: Three times a day (TID) | INTRAMUSCULAR | Status: DC
  Administered 2018-02-24 – 2018-02-26 (×7): 40 mg via INTRAVENOUS
  Filled 2018-02-24 (×7): qty 1

## 2018-02-24 MED ORDER — CHLORHEXIDINE GLUCONATE CLOTH 2 % EX PADS
6.0000 | MEDICATED_PAD | Freq: Every day | CUTANEOUS | Status: AC
  Administered 2018-02-24 – 2018-02-28 (×5): 6 via TOPICAL

## 2018-02-24 MED ORDER — FUROSEMIDE 10 MG/ML IJ SOLN
20.0000 mg | Freq: Once | INTRAMUSCULAR | Status: AC
  Administered 2018-02-24: 20 mg via INTRAVENOUS
  Filled 2018-02-24: qty 2

## 2018-02-24 MED ORDER — MUPIROCIN 2 % EX OINT
1.0000 "application " | TOPICAL_OINTMENT | Freq: Two times a day (BID) | CUTANEOUS | Status: AC
  Administered 2018-02-24 – 2018-02-28 (×10): 1 via NASAL
  Filled 2018-02-24: qty 22

## 2018-02-24 MED ORDER — SODIUM CHLORIDE 0.9 % IV SOLN
500.0000 mg | INTRAVENOUS | Status: DC
  Administered 2018-02-24: 500 mg via INTRAVENOUS
  Filled 2018-02-24 (×2): qty 500

## 2018-02-24 NOTE — Progress Notes (Signed)
[employer] [facility] -  at [facility]   PATIENT NAME: Sheila Hayes    MR#:  [id number]  DATE OF BIRTH:  1941-02-26  SUBJECTIVE:  Shortness of breath improving. Remains on BiPAP this morning.  REVIEW OF SYSTEMS:  ROS- unable to assess due to BiPAP  DRUG ALLERGIES:  No Known Allergies VITALS:  Blood pressure 110/66, pulse 92, temperature 99 F (37.2 C), temperature source Axillary, resp. rate (!) 22, height 5\' 6"  (1.676 m), weight 110.2 kg, SpO2 94 %. PHYSICAL EXAMINATION:  Physical Exam  Constitutional: She appears well-developed and well-nourished. No distress.  HEENT: BiPAP in place. Normocephalic and atraumatic. Oropharynx is clear and moist. Pupils are equal, round, and reactive to light. Conjunctivae and EOM are normal. No scleral icterus.  Neck: Normal range of motion. Neck supple. No JVD present. No thyromegaly present.  Cardiovascular: Normal rate, regular rhythm and intact distal pulses. Exam reveals no gallop and no friction rub.  No murmur heard. Respiratory: Effort normal. No respiratory distress. +expiratory wheezing present. She has no rales. BiPAP in place. GI: Soft. Bowel sounds are normal. She exhibits no distension. There is no tenderness.  Musculoskeletal: Normal range of motion. She exhibits no edema.  Lymphadenopathy:    She has no cervical adenopathy.  Neurological: alert and following commands. No focal deficits. Skin: Skin is warm and dry. No rash noted. No erythema.  Psychiatric:  Unable to assess due to patient condition LABORATORY PANEL:  Female CBC Recent Labs  Lab 02/24/18 0303  WBC 5.4  HGB 10.5*  HCT 32.2*  PLT 173   ------------------------------------------------------------------------------------------------------------------ Chemistries  Recent Labs  Lab 02/23/18 1800 02/24/18 0303  NA 142 141  K 3.7 3.8  CL 90* 89*  CO2 43* 40*  GLUCOSE 89 230*  BUN 11 16  CREATININE 0.72 0.84  CALCIUM 8.8* 8.6*  AST 12*   --   ALT 9  --   ALKPHOS 43  --   BILITOT 0.7  --    RADIOLOGY:  Dg Chest 1 View  Result Date: 02/23/2018 CLINICAL DATA:  Shortness of breath and wheezing EXAM: CHEST  1 VIEW COMPARISON:  01/30/2018 FINDINGS: Unchanged cardiomegaly. There is mild interstitial pulmonary edema. No focal airspace consolidation. No pleural effusion or pneumothorax. IMPRESSION: Cardiomegaly and mild interstitial pulmonary edema. Electronically Signed   By: Deatra Robinson M.D.   On: 02/23/2018 18:54   Ct Head Wo Contrast  Result Date: 02/23/2018 CLINICAL DATA:  77 year old female with altered mental status. Hypoxia. EXAM: CT HEAD WITHOUT CONTRAST TECHNIQUE: Contiguous axial images were obtained from the base of the skull through the vertex without intravenous contrast. COMPARISON:  Head CT without contrast 01/30/2018 and earlier. FINDINGS: Brain: Small right vertex dural calcification versus small 8 millimeter calcified meningioma, stable since 2018. No associated mass effect or edema. Normal for age cerebral volume and gray-white matter differentiation. No midline shift, ventriculomegaly, mass effect, intracranial hemorrhage or evidence of cortically based acute infarction. Vascular: Mild Calcified atherosclerosis at the skull base. No suspicious intracranial vascular hyperdensity. Skull: No acute osseous abnormality identified. Hyperostosis frontalis (normal variant). Sinuses/Orbits: Visualized paranasal sinuses and mastoids are stable and well pneumatized. Other: No acute orbit or scalp soft tissue findings. 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 Electronically Signed   By: Odessa Fleming M.D.   On: 02/23/2018 18:55   ASSESSMENT AND PLAN:   Acute hypoxic respiratory failure secondary to acute COPD exacerbation and  CHF exacerbation- remained on BiPAP this morning. - continue BiPAP and transition to [name] as able -  continue IV steroids - continue azithromycin - dulera, duonebs prn - s/p IV lasix x 1, continue lasix 20mg  PO daily  Hypertension- BPs soft today - continue home lopressor  Type 2 diabetes- blood sugars elevated on steroids - sensitive SSI  Hypothyroidism- stable - continue home synthroid  Hyperlipidemia- stable - continue home crestor   All the records are reviewed and case discussed with Care Management/Social Worker. Management plans discussed with the patient, family and they are in agreement.  CODE STATUS: Full Code  TOTAL TIME TAKING CARE OF THIS PATIENT: 35 minutes.   More than 50% of the time was spent in counseling/coordination of care: YES  POSSIBLE D/C IN 2-3 DAYS, DEPENDING ON CLINICAL CONDITION.   Jinny Blossom Simmie Camerer M.D on 02/24/2018 at 3:01 PM  Between 7am to 6pm - Pager - [phone]  After 6pm go to [email or web address] - Social research officer, government  [facility] [place] [facility]  Office  [phone]  CC: Primary care physician; Housecalls, Doctors Making  Note: This dictation was prepared with Dragon dictation along with smaller phrase technology. Any transcriptional errors that result from this process are unintentional.

## 2018-02-25 ENCOUNTER — Encounter: Payer: Self-pay | Admitting: *Deleted

## 2018-02-25 ENCOUNTER — Other Ambulatory Visit: Payer: Self-pay

## 2018-02-25 LAB — URINE CULTURE: Culture: NO GROWTH

## 2018-02-25 LAB — GLUCOSE, CAPILLARY
GLUCOSE-CAPILLARY: 171 mg/dL — AB (ref 70–99)
GLUCOSE-CAPILLARY: 179 mg/dL — AB (ref 70–99)
GLUCOSE-CAPILLARY: 149 mg/dL — AB (ref 70–99)
GLUCOSE-CAPILLARY: 150 mg/dL — AB (ref 70–99)
Glucose-Capillary: 176 mg/dL — ABNORMAL HIGH (ref 70–99)

## 2018-02-25 LAB — PROCALCITONIN: Procalcitonin: <0.1 ng/mL

## 2018-02-25 MED ORDER — ENOXAPARIN SODIUM 40 MG/0.4ML ~~LOC~~ SOLN
40.0000 mg | SUBCUTANEOUS | Status: DC
  Administered 2018-02-25 – 2018-02-27 (×3): 40 mg via SUBCUTANEOUS
  Filled 2018-02-25 (×3): qty 0.4

## 2018-02-25 MED ORDER — AZITHROMYCIN 250 MG PO TABS
500.0000 mg | ORAL_TABLET | Freq: Every day | ORAL | Status: AC
  Administered 2018-02-25 – 2018-02-26 (×2): 500 mg via ORAL
  Filled 2018-02-25: qty 1
  Filled 2018-02-25: qty 2

## 2018-02-25 NOTE — Progress Notes (Signed)
Report called to Serenity, RN on 2A. Patient transported to room 260 via hospital bed on oxygen by Chasity, NT.

## 2018-02-25 NOTE — Progress Notes (Signed)
[employer] - [place] at [facility]   PATIENT NAME: Sheila Hayes    MR#:  [id number]  DATE OF BIRTH:  September 19, 1940  SUBJECTIVE:  Feeling better this morning. Off BiPAP and doing well on [place].  REVIEW OF SYSTEMS:  Review of Systems  Constitutional: Negative for chills and fever.  HENT: Negative for congestion and sore throat.   Respiratory: Positive for cough and shortness of breath.   Cardiovascular: Negative for chest pain, palpitations and leg swelling.  Gastrointestinal: Negative for abdominal pain, nausea and vomiting.  Genitourinary: Negative for dysuria and frequency.  Musculoskeletal: Negative for back pain and neck pain.  Skin: Negative.   Neurological: Negative for dizziness and headaches.  Psychiatric/Behavioral: Negative for depression. The patient is not nervous/anxious.     DRUG ALLERGIES:  No Known Allergies VITALS:  Blood pressure 129/66, pulse 71, temperature 98.9 F (37.2 C), temperature source Oral, resp. rate 19, height 5\' 6"  (1.676 m), weight 107.9 kg, SpO2 99 %. PHYSICAL EXAMINATION:  Physical Exam  Constitutional: She appears well-developed and well-nourished. No distress.  HEENT: Normocephalic and atraumatic. Oropharynx is clear and moist. Pupils are equal, round, and reactive to light. Conjunctivae and EOM are normal. No scleral icterus.  Neck: Normal range of motion. Neck supple. No JVD present. No thyromegaly present.  Cardiovascular: Normal rate, regular rhythm and intact distal pulses. No murmurs, rubs, or gallops. Respiratory: Effort normal. No respiratory distress. +expiratory wheezing present throughout all lung fields. No crackles. Ferdinand in place. GI: +BS, soft, non-tender, non-distended Musculoskeletal: Normal range of motion. She exhibits no edema.  Neurological: CN 2-12 intact, no focal deficits. Skin: Skin is warm and dry. No rash noted. No erythema.  Psychiatric: Alert and oriented x 3. Normal thought content. LABORATORY  PANEL:  Female CBC Recent Labs  Lab 02/24/18 0303  WBC 5.4  HGB 10.5*  HCT 32.2*  PLT 173   ------------------------------------------------------------------------------------------------------------------ Chemistries  Recent Labs  Lab 02/23/18 1800 02/24/18 0303  NA 142 141  K 3.7 3.8  CL 90* 89*  CO2 43* 40*  GLUCOSE 89 230*  BUN 11 16  CREATININE 0.72 0.84  CALCIUM 8.8* 8.6*  AST 12*  --   ALT 9  --   ALKPHOS 43  --   BILITOT 0.7  --    RADIOLOGY:  No results found. ASSESSMENT AND PLAN:   Acute hypoxic respiratory failure secondary to acute COPD exacerbation- off BiPAP and stable on  this morning - continue IV steroids - continue azithromycin - dulera, duonebs prn - wean O2 as able  Chronic diastolic heart failure- stable - continue lasix 20mg  po daily  Hypertension- normotensive today - continue home lopressor  Type 2 diabetes- blood sugars elevated on steroids - sensitive SSI  Hypothyroidism- stable - continue home synthroid  Hyperlipidemia- stable - continue home crestor   All the records are reviewed and case discussed with Care Management/Social Worker. Management plans discussed with the patient, family and they are in agreement.  CODE STATUS: Full Code  TOTAL TIME TAKING CARE OF THIS PATIENT: 33 minutes.   More than 50% of the time was spent in counseling/coordination of care: YES  POSSIBLE D/C IN 2-3 DAYS, DEPENDING ON CLINICAL CONDITION.   Jinny Blossom Eiden Bagot M.D on 02/25/2018 at 4:39 PM  Between 7am to 6pm - Pager - [phone]  After 6pm go to [email or web address] - Scientist, research (life sciences) [place] [facility]  Office  [phone]  CC: Primary care physician; Housecalls, Doctors Making  Note: This  dictation was prepared with Dragon dictation along with smaller phrase technology. Any transcriptional errors that result from this process are unintentional.

## 2018-02-25 NOTE — Progress Notes (Signed)
Name: Sheila Hayes MRN: [id number] DOB: 09-01-1940     CONSULTATION DATE: 02/23/2018  Subjective & objective: Patient has been off BiPAP and tolerating nasal cannula with no major events.  PAST MEDICAL HISTORY :   has a past medical history of Chronic diastolic congestive heart failure (HCC), COPD (chronic obstructive pulmonary disease) (HCC), Diabetes mellitus without complication (HCC), Hyperlipidemia, Hypertension, Hypothyroidism, Morbid obesity (HCC), and Pulmonary hypertension (HCC).  has a past surgical history that includes Cholecystectomy and Appendectomy. Prior to Admission medications   Medication Sig Start Date End Date Taking? Authorizing Provider  acetaminophen (TYLENOL) 500 MG tablet Take 500 mg by mouth every 6 (six) hours as needed for headache.   Yes [provider]  albuterol (PROVENTIL HFA;VENTOLIN HFA) 108 (90 Base) MCG/ACT inhaler Inhale 2 puffs into the lungs every 6 (six) hours as needed for wheezing or shortness of breath.   Yes [provider]  budesonide-formoterol (SYMBICORT) 80-4.5 MCG/ACT inhaler Inhale 2 puffs into the lungs 2 (two) times daily.   Yes [provider]  calcium carbonate (OSCAL) 1500 (600 Ca) MG TABS tablet Take 600 mg of elemental calcium by mouth 2 (two) times daily with a meal.    Yes [provider]  citalopram (CELEXA) 40 MG tablet Take 40 mg by mouth daily.   Yes [provider]  docusate sodium (COLACE) 100 MG capsule Take 200 mg by mouth at bedtime.   Yes [provider]  furosemide (LASIX) 20 MG tablet Take 20 mg by mouth daily.   Yes [provider]  gabapentin (NEURONTIN) 100 MG capsule Take 200 mg by mouth 2 (two) times daily.   Yes [provider]  glipiZIDE (GLUCOTROL) 10 MG tablet Take 10 mg by mouth daily before breakfast.   Yes [provider]  levothyroxine (SYNTHROID, LEVOTHROID) 125 MCG tablet Take 125 mcg by mouth daily before breakfast.   Yes  [provider]  metFORMIN (GLUCOPHAGE) 500 MG tablet Take 1,000 mg by mouth 2 (two) times daily with a meal.    Yes [provider]  metoprolol tartrate (LOPRESSOR) 25 MG tablet Take 0.5 tablets (12.5 mg total) by mouth 2 (two) times daily. Patient taking differently: Take 12.5 mg by mouth 2 (two) times daily. HOLD for SBP <100 or HR <60 05/20/16  Yes Shaune Pollack, MD  nystatin (NYSTATIN) powder Apply topically 2 (two) times daily as needed. For fungal growth   Yes [provider]  omeprazole (PRILOSEC) 20 MG capsule Take 20 mg by mouth daily.   Yes [provider]  potassium chloride (K-DUR) 10 MEQ tablet Take 10 mEq by mouth daily.   Yes [provider]  rosuvastatin (CRESTOR) 40 MG tablet Take 40 mg by mouth every evening.    Yes [provider]  tiotropium (SPIRIVA) 18 MCG inhalation capsule Place 18 mcg into inhaler and inhale daily.   Yes [provider]  predniSONE (DELTASONE) 10 MG tablet Take 1 tablet (10 mg total) by mouth daily. Label  & dispense according to the schedule below.  6 tablets day one, then 5 table day 2, then 4 tablets day 3, then 3 tablets day 4, 2 tablets day 5, then 1 tablet day 6, then stop Patient not taking: Reported on 02/23/2018 02/02/18   Ihor Austin, MD   No Known Allergies  FAMILY HISTORY:  family history includes Colon cancer in her brother; Diabetes in her brother and sister; Heart attack in her father and mother; Hypertension in her brother  and sister; Lung cancer in her brother. SOCIAL HISTORY:  reports that she has quit smoking. She has never used smokeless tobacco. She reports that she does not drink alcohol or use drugs.  REVIEW OF SYSTEMS:   Unable to obtain due to critical illness   VITAL SIGNS: Temp:  [98.6 F (37 C)-98.9 F (37.2 C)] 98.9 F (37.2 C) (09/06 0800) Pulse Rate:  [61-94] 61 (09/06 0800) Resp:  [13-25] 18 (09/06 0800) BP: (100-130)/(47-94) 130/67 (09/06 0800) SpO2:   [92 %-100 %] 100 % (09/06 0800) FiO2 (%):  [35 %] 35 % (09/05 1344) Weight:  [107.9 kg] 107.9 kg (09/06 0641)  Physical Examination:  Awake and oriented with no acute focal neurological deficits Tolerating nasal cannula with no distress, able to talk in full sentences, bilateral equal air entry and no adventitious sounds S1 & S2 are audible with no murmur Benign abdominal exam with normal peristalsis Within normal extremities and no peripheral edema   ASSESSMENT / PLAN:   Acute respiratory failure improved off BiPAP and tolerating nasal cannula -Monitor work of breathing and O2 sat  Acute exacerbation of COPD -Bronchodilators of + inhaled steroids + tapering systemic steroids + empiric antibiotic  Atelectasis and possible pneumonia was infective etiology cannot be ruled out.  Bibasilar airspace disease with pulmonary congestion -Empiric Zithromax.  Monitor CXR + CBC + FiO2 requirement -Gentle diuresis to improve lung compliance  HFpEF EF 55 to 60% on echo cardiogram 09/2017, pulmonary hypertension and right bundle branch block -Optimize diuresis and watch for volume overload  Diabetes mellitus -Glycemic control  Hypothyroidism.  Free T4 1.02 -Optimize levothyroxine  Dyslipidemia -Statin  Anemia -Keep hemoglobin more than 7 g/dL  MRSA nares colonization -Mupirocin topical  Full code  DVT & GI prophylaxis.  Continue with supportive care  Critical care time 30 minutes

## 2018-02-25 NOTE — Plan of Care (Signed)

## 2018-02-25 NOTE — Progress Notes (Signed)
PHARMACIST - PHYSICIAN COMMUNICATION  CONCERNING: Antibiotic IV to Oral Route Change Policy  RECOMMENDATION: This patient is receiving azithromycin by the intravenous route.  Based on criteria approved by the Pharmacy and Therapeutics Committee, the antibiotic(s) is/are being converted to the equivalent oral dose form(s).   DESCRIPTION: These criteria include:  Patient being treated for a respiratory tract infection, urinary tract infection, cellulitis or clostridium difficile associated diarrhea if on metronidazole  The patient is not neutropenic and does not exhibit a GI malabsorption state  The patient is eating (either orally or via tube) and/or has been taking other orally administered medications for a least 24 hours  The patient is improving clinically and has a Tmax < 100.5  If you have questions about this conversion, please contact the Pharmacy Department  []  ( [phone] )  [facility] [x]  ( [phone] )  [facility] []  ( [phone]  [facility] []  ( [phone]  [facility] []  ( [phone] )  [facility]   MLS 

## 2018-02-26 ENCOUNTER — Inpatient Hospital Stay: Payer: Medicare Other

## 2018-02-26 LAB — CBC WITH DIFFERENTIAL/PLATELET
Basophils Absolute: 0 10*3/uL (ref 0–0.1)
Basophils Relative: 0 %
EOS PCT: 0 %
Eosinophils Absolute: 0 10*3/uL (ref 0–0.7)
HCT: 33.1 % — ABNORMAL LOW (ref 35.0–47.0)
Hemoglobin: 10.9 g/dL — ABNORMAL LOW (ref 12.0–16.0)
LYMPHS ABS: 0.8 10*3/uL — AB (ref 1.0–3.6)
LYMPHS PCT: 14 %
MCH: 28.6 pg (ref 26.0–34.0)
MCHC: 32.9 g/dL (ref 32.0–36.0)
MCV: 87.1 fL (ref 80.0–100.0)
MONOS PCT: 4 %
Monocytes Absolute: 0.2 10*3/uL (ref 0.2–0.9)
Neutro Abs: 4.6 10*3/uL (ref 1.4–6.5)
Neutrophils Relative %: 82 %
PLATELETS: 228 10*3/uL (ref 150–440)
RBC: 3.81 MIL/uL (ref 3.80–5.20)
RDW: 14.4 % (ref 11.5–14.5)
WBC: 5.6 10*3/uL (ref 3.6–11.0)

## 2018-02-26 LAB — BASIC METABOLIC PANEL
Anion gap: 9 (ref 5–15)
BUN: 22 mg/dL (ref 8–23)
CHLORIDE: 91 mmol/L — AB (ref 98–111)
CO2: 40 mmol/L — ABNORMAL HIGH (ref 22–32)
CREATININE: 0.82 mg/dL (ref 0.44–1.00)
Calcium: 8.7 mg/dL — ABNORMAL LOW (ref 8.9–10.3)
GFR calc Af Amer: >60 mL/min (ref >60)
GLUCOSE: 196 mg/dL — AB (ref 70–99)
POTASSIUM: 3.9 mmol/L (ref 3.5–5.1)
Sodium: 140 mmol/L (ref 135–145)

## 2018-02-26 LAB — GLUCOSE, CAPILLARY
GLUCOSE-CAPILLARY: 332 mg/dL — AB (ref 70–99)
Glucose-Capillary: 167 mg/dL — ABNORMAL HIGH (ref 70–99)
Glucose-Capillary: 178 mg/dL — ABNORMAL HIGH (ref 70–99)
Glucose-Capillary: 185 mg/dL — ABNORMAL HIGH (ref 70–99)
Glucose-Capillary: 195 mg/dL — ABNORMAL HIGH (ref 70–99)

## 2018-02-26 LAB — PHOSPHORUS: Phosphorus: 3.4 mg/dL (ref 2.5–4.6)

## 2018-02-26 LAB — MAGNESIUM: Magnesium: 2.3 mg/dL (ref 1.7–2.4)

## 2018-02-26 MED ORDER — METHYLPREDNISOLONE SODIUM SUCC 40 MG IJ SOLR: 40.0000 mg | INTRAMUSCULAR | Status: DC

## 2018-02-26 MED ORDER — IPRATROPIUM-ALBUTEROL 0.5-2.5 (3) MG/3ML IN SOLN
3.0000 mL | Freq: Two times a day (BID) | RESPIRATORY_TRACT | Status: DC
  Administered 2018-02-26 – 2018-02-28 (×4): 3 mL via RESPIRATORY_TRACT
  Filled 2018-02-26 (×4): qty 3

## 2018-02-26 NOTE — [place] FL2 (Addendum)
[place] MEDICAID FL2 LEVEL OF CARE SCREENING TOOL     IDENTIFICATION  Patient Name: Sheila Hayes Birthdate: 1941/03/25 Sex: female Admission Date (Current Location): 02/23/2018  [place] and [place] Number:  [address] [id number] K Facility and Address:  [facility], [address] [place], [place] [address]      Provider Number: [id number]  Attending Physician Name and Address:  Ramonita Lab, MD  Relative Name and Phone Number:  Eustace Quail (niece) [phone]    Current Level of Care: Hospital Recommended Level of Care: Assisted Living Facility Prior Approval Number:    Date Approved/Denied:   PASRR Number:    Discharge Plan: Domiciliary (Rest home)    Current Diagnoses: Patient Active Problem List   Diagnosis Date Noted  . Acute on chronic diastolic CHF (congestive heart failure) (HCC) 02/23/2018  . Altered mental status   . Urinary tract infection without hematuria   . Acute respiratory failure with hypoxia and hypercapnia (HCC) 01/30/2018  . Near syncope 10/09/2017  . HTN (hypertension) 10/09/2017  . Diabetes (HCC) 10/09/2017  . HLD (hyperlipidemia) 10/09/2017  . Chronic diastolic CHF (congestive heart failure) (HCC) 10/09/2017  . Hypothyroidism 10/09/2017  . COPD exacerbation (HCC) 05/13/2016    Orientation RESPIRATION BLADDER Height & Weight     Self, Situation, Time, Place  O2(3L o2) Continent Weight: 237 lb (107.5 kg) Height:  5\' 6"  (167.6 cm)  BEHAVIORAL SYMPTOMS/MOOD NEUROLOGICAL BOWEL NUTRITION STATUS      Continent Diet(Heart healthy/carb modified)  AMBULATORY STATUS COMMUNICATION OF NEEDS Skin   Supervision Verbally Normal                       Personal Care Assistance Level of Assistance  Bathing, Feeding, Dressing, Total care Bathing Assistance: Limited assistance Feeding assistance: Independent Dressing Assistance: Limited assistance Total Care Assistance: Independent   Functional Limitations Info   Sight, Hearing, Speech Sight Info: Adequate Hearing Info: Adequate Speech Info: Adequate    SPECIAL CARE FACTORS FREQUENCY                       Contractures Contractures Info: Not present    Additional Factors Info  Code Status, Allergies Code Status Info: Full Allergies Info: No known allergies           Current Medications (02/26/2018):  This is the current hospital active medication list Current Facility-Administered Medications  Medication Dose Route Frequency Provider Last Rate Last Dose  . acetaminophen (TYLENOL) tablet 650 mg  650 mg Oral Q6H PRN Oralia Manis, MD       Or  . acetaminophen (TYLENOL) suppository 650 mg  650 mg Rectal Q6H PRN Oralia Manis, MD      . chlorhexidine (PERIDEX) 0.12 % solution 15 mL  15 mL Mouth Rinse BID Oralia Manis, MD   15 mL at 02/26/18 1113  . Chlorhexidine Gluconate Cloth 2 % PADS 6 each  6 each Topical Q0600 Oralia Manis, MD   6 each at 02/26/18 0533  . citalopram (CELEXA) tablet 40 mg  40 mg Oral Daily Oralia Manis, MD   40 mg at 02/26/18 1100  . enoxaparin (LOVENOX) injection 40 mg  40 mg Subcutaneous Q24H Mayo, Allyn Kenner, MD   40 mg at 02/25/18 2127  . furosemide (LASIX) tablet 20 mg  20 mg Oral Daily Oralia Manis, MD   20 mg at 02/26/18 1114  . gabapentin (NEURONTIN) capsule 200 mg  200 mg Oral BID Oralia Manis, MD  200 mg at 02/26/18 1113  . insulin aspart (novoLOG) injection 0-9 Units  0-9 Units Subcutaneous Q6H Oralia Manis, MD   7 Units at 02/26/18 1300  . ipratropium-albuterol (DUONEB) 0.5-2.5 (3) MG/3ML nebulizer solution 3 mL  3 mL Nebulization Q2H PRN Oralia Manis, MD      . ipratropium-albuterol (DUONEB) 0.5-2.5 (3) MG/3ML nebulizer solution 3 mL  3 mL Nebulization BID Gouru, Aruna, MD      . levothyroxine (SYNTHROID, LEVOTHROID) tablet 125 mcg  125 mcg Oral QAC breakfast Oralia Manis, MD   125 mcg at 02/26/18 0854  . MEDLINE mouth rinse  15 mL Mouth Rinse q12n4p Oralia Manis, MD   15 mL at 02/25/18 1319  .  [START ON 02/27/2018] methylPREDNISolone sodium succinate (SOLU-MEDROL) 40 mg/mL injection 40 mg  40 mg Intravenous Q24H Gouru, Aruna, MD      . metoprolol tartrate (LOPRESSOR) tablet 12.5 mg  12.5 mg Oral BID Oralia Manis, MD   12.5 mg at 02/26/18 1115  . mometasone-formoterol (DULERA) 100-5 MCG/ACT inhaler 2 puff  2 puff Inhalation BID Oralia Manis, MD   2 puff at 02/26/18 0854  . mupirocin ointment (BACTROBAN) 2 % 1 application  1 application Nasal BID Oralia Manis, MD   1 application at 02/26/18 1113  . ondansetron (ZOFRAN) tablet 4 mg  4 mg Oral Q6H PRN Oralia Manis, MD       Or  . ondansetron [facility]) injection 4 mg  4 mg Intravenous Q6H PRN Oralia Manis, MD      . pantoprazole (PROTONIX) EC tablet 40 mg  40 mg Oral Daily Oralia Manis, MD   40 mg at 02/26/18 1114  . rosuvastatin (CRESTOR) tablet 40 mg  40 mg Oral QPM Oralia Manis, MD   40 mg at 02/25/18 1800  . [START ON 02/27/2018] tiotropium (SPIRIVA) inhalation capsule 18 mcg  18 mcg Inhalation Daily Mayo, Allyn Kenner, MD         Discharge Medications: TAKE these medications   acetaminophen 500 MG tablet Commonly known as:  TYLENOL Take 500 mg by mouth every 6 (six) hours as needed for headache.   albuterol 108 (90 Base) MCG/ACT inhaler Commonly known as:  PROVENTIL HFA;VENTOLIN HFA Inhale 2 puffs into the lungs every 6 (six) hours as needed for wheezing or shortness of breath.   budesonide-formoterol 80-4.5 MCG/ACT inhaler Commonly known as:  SYMBICORT Inhale 2 puffs into the lungs 2 (two) times daily.   calcium carbonate 1500 (600 Ca) MG Tabs tablet Commonly known as:  OSCAL Take 600 mg of elemental calcium by mouth 2 (two) times daily with a meal.   citalopram 40 MG tablet Commonly known as:  CELEXA Take 40 mg by mouth daily.   docusate sodium 100 MG capsule Commonly known as:  COLACE Take 200 mg by mouth at bedtime.   furosemide 20 MG tablet Commonly known as:  LASIX Take 20 mg by mouth daily.    gabapentin 100 MG capsule Commonly known as:  NEURONTIN Take 200 mg by mouth 2 (two) times daily.   glipiZIDE 10 MG tablet Commonly known as:  GLUCOTROL Take 10 mg by mouth daily before breakfast.   levothyroxine 125 MCG tablet Commonly known as:  SYNTHROID, LEVOTHROID Take 125 mcg by mouth daily before breakfast.   metFORMIN 500 MG tablet Commonly known as:  GLUCOPHAGE Take 1,000 mg by mouth 2 (two) times daily with a meal.   metoprolol tartrate 25 MG tablet Commonly known as:  LOPRESSOR Take 0.5 tablets (12.5 mg  total) by mouth 2 (two) times daily. What changed:  additional instructions   mupirocin ointment 2 % Commonly known as:  BACTROBAN Place 1 application into the nose 2 (two) times daily for 2 days.   nystatin powder Generic drug:  nystatin Apply topically 2 (two) times daily as needed. For fungal growth   omeprazole 20 MG capsule Commonly known as:  PRILOSEC Take 20 mg by mouth daily.   potassium chloride 10 MEQ tablet Commonly known as:  K-DUR Take 10 mEq by mouth daily.   predniSONE 10 MG (21) Tbpk tablet Commonly known as:  STERAPRED UNI-PAK 21 TAB Take 1 tablet (10 mg total) by mouth daily. Take 6 tablets by mouth for 1 day followed by  5 tablets by mouth for 1 day followed by  4 tablets by mouth for 1 day followed by  3 tablets by mouth for 1 day followed by  2 tablets by mouth for 1 day followed by  1 tablet by mouth for a day and stop Replaces:  predniSONE 10 MG tablet   rosuvastatin 40 MG tablet Commonly known as:  CRESTOR Take 40 mg by mouth every evening.   tiotropium 18 MCG inhalation capsule Commonly known as:  SPIRIVA Place 18 mcg into inhaler and inhale daily.     Relevant Imaging Results:  Relevant Lab Results:   Additional Information SS# [id number]  Judi Cong, LCSW

## 2018-02-26 NOTE — Plan of Care (Signed)

## 2018-02-26 NOTE — Progress Notes (Signed)
[facility] [place] [facility]   PATIENT NAME: Sheila Hayes    MR#:  [id number]  DATE OF BIRTH:  [birth year]  SUBJECTIVE:  Feeling better this morning.  Shortness of breath is improving Transferred to floor from ICU on 02/25/2018  REVIEW OF SYSTEMS:  Review of Systems  Constitutional: Negative for chills and fever.  HENT: Negative for congestion and sore throat.   Respiratory: Positive for cough and shortness of breath.   Cardiovascular: Negative for chest pain, palpitations and leg swelling.  Gastrointestinal: Negative for abdominal pain, nausea and vomiting.  Genitourinary: Negative for dysuria and frequency.  Musculoskeletal: Negative for back pain and neck pain.  Skin: Negative.   Neurological: Negative for dizziness and headaches.  Psychiatric/Behavioral: Negative for depression. The patient is not nervous/anxious.     DRUG ALLERGIES:  No Known Allergies VITALS:  Blood pressure (!) 145/71, pulse 60, temperature 98.1 F (36.7 C), temperature source Oral, resp. rate 17, height 5\' 6"  (1.676 m), weight 107.5 kg, SpO2 94 %. PHYSICAL EXAMINATION:  Physical Exam  Constitutional: She appears well-developed and well-nourished. No distress.  HEENT: Normocephalic and atraumatic. Oropharynx is clear and moist. Pupils are equal, round, and reactive to light. Conjunctivae and EOM are normal. No scleral icterus.  Neck: Normal range of motion. Neck supple. No JVD present. No thyromegaly present.  Cardiovascular: Normal rate, regular rhythm and intact distal pulses. No murmurs, rubs, or gallops. Respiratory: Effort normal. No respiratory distress. +expiratory wheezing present throughout all lung fields. No crackles. [place] in place. GI: +BS, soft, non-tender, non-distended Musculoskeletal: Normal range of motion. She exhibits no edema.  Neurological: CN 2-12 intact, no focal deficits. Skin: Skin is warm and dry. No rash noted. No erythema.  Psychiatric: Alert and  oriented x 3. Normal thought content. LABORATORY PANEL:  Female CBC Recent Labs  Lab 02/26/18 0505  WBC 5.6  HGB 10.9*  HCT 33.1*  PLT 228   ------------------------------------------------------------------------------------------------------------------ Chemistries  Recent Labs  Lab 02/23/18 1800  02/26/18 0505  NA 142   < > 140  K 3.7   < > 3.9  CL 90*   < > 91*  CO2 43*   < > 40*  GLUCOSE 89   < > 196*  BUN 11   < > 22  CREATININE 0.72   < > 0.82  CALCIUM 8.8*   < > 8.7*  MG  --   --  2.3  AST 12*  --   --   ALT 9  --   --   ALKPHOS 43  --   --   BILITOT 0.7  --   --    < > = values in this interval not displayed.   RADIOLOGY:  Dg Chest Port 1 View  Result Date: 02/26/2018 CLINICAL DATA:  Atelectasis EXAM: PORTABLE CHEST 1 VIEW COMPARISON:  February 23, 2018 FINDINGS: Mild cardiomegaly. The hila and mediastinum are unremarkable. No pneumothorax. No pulmonary nodules or masses. No focal infiltrates. IMPRESSION: No active disease. Electronically Signed   By: Gerome Sam III M.D   On: 02/26/2018 08:38   ASSESSMENT AND PLAN:   Acute hypoxic respiratory failure secondary to acute COPD exacerbation- off BiPAP and stable on [place]  -Taper IV steroids - continue azithromycin - dulera, duonebs prn - wean O2 as able  Chronic diastolic heart failure- stable - continue lasix 20mg  po daily  Hypertension- normotensive today - continue home lopressor  Type 2 diabetes- blood sugars elevated on steroids - sensitive SSI  Hypothyroidism- stable - continue home synthroid  Hyperlipidemia- stable - continue home crestor   All the records are reviewed and case discussed with Care Management/Social Worker. Management plans discussed with the patient, family and they are in agreement.  CODE STATUS: Full Code  TOTAL TIME TAKING CARE OF THIS PATIENT: 33 minutes.   More than 50% of the time was spent in counseling/coordination of care: YES  POSSIBLE D/C IN 1-2 DAYS,  DEPENDING ON CLINICAL CONDITION.   Ramonita Lab M.D on 02/26/2018 at 3:58 PM  Between 7am to 6pm - Pager - [phone]  After 6pm go to [email or web address] - Social research officer, government  [facility]  Office  [phone]  CC: Primary care physician; [facility], [facility]  Note: This dictation was prepared with Dragon dictation along with smaller phrase technology. Any transcriptional errors that result from this process are unintentional.

## 2018-02-26 NOTE — Clinical Social Work Note (Signed)
Clinical Social Work Assessment  Patient Details  Name: Sheila Hayes MRN: [id number] Date of Birth: [birth year]  Date of referral:  02/26/18               Reason for consult:  Facility Placement                Permission sought to share information with:  Chartered certified accountant granted to share information::  Yes, Verbal Permission Granted  Name::        Agency::  [facility]  Relationship::     Contact Information:     Housing/Transportation Living arrangements for the past 2 months:  [place] of Information:  Patient, Medical Team Patient Interpreter Needed:  None Criminal Activity/Legal Involvement Pertinent to Current Situation/Hospitalization:  No - Comment as needed Significant Relationships:  Warehouse manager, Friend, Other Family Members Lives with:  Facility Resident Do you feel safe going back to the place where you live?  Yes Need for family participation in patient care:  No (Coment)  Care giving concerns:  Patient admitted from [facility]   Social Worker assessment / plan: The CSW met with the patient at bedside to discuss discharge planning. The patient confirmed that she has been a resident of [facility] for a year and a half and would like to return when stable. The patient has supportive family, and she feels well cared for at the ALF. At baseline, the patient uses home o2 (3L).  The CSW will follow for discharge facilitation once the patient is stable.  Employment status:  Retired Forensic scientist:  Medicaid In [place], [place] PT Recommendations:  Not assessed at this time Information / Referral to community resources:     Patient/Family's Response to care: The patient thanked the CSW.  Patient/Family's Understanding of and Emotional Response to Diagnosis, Current Treatment, and Prognosis:  The patient is in agreeement with return to the ALF of origin once medically stable.  Emotional  Assessment Appearance:  Appears stated age Attitude/Demeanor/Rapport:  Gracious, Engaged Affect (typically observed):  Pleasant Orientation:  Oriented to Self, Oriented to Place, Oriented to  Time, Oriented to Situation Alcohol / Substance use:  Never Used Psych involvement (Current and /or in the community):  No (Comment)  Discharge Needs  Concerns to be addressed:  Discharge Planning Concerns, Care Coordination Readmission within the last 30 days:  Yes Current discharge risk:  Chronically ill Barriers to Discharge:  Continued Medical Work up   [employer], LCSW 02/26/2018, 4:16 PM

## 2018-02-27 LAB — GLUCOSE, CAPILLARY
GLUCOSE-CAPILLARY: 149 mg/dL — AB (ref 70–99)
GLUCOSE-CAPILLARY: 152 mg/dL — AB (ref 70–99)
Glucose-Capillary: 102 mg/dL — ABNORMAL HIGH (ref 70–99)
Glucose-Capillary: 110 mg/dL — ABNORMAL HIGH (ref 70–99)
Glucose-Capillary: 127 mg/dL — ABNORMAL HIGH (ref 70–99)

## 2018-02-27 LAB — CALCIUM, IONIZED: Calcium, Ionized, Serum: 4.7 mg/dL (ref 4.5–5.6)

## 2018-02-27 MED ORDER — PREDNISONE 10 MG (21) PO TBPK: 10.0000 mg | ORAL_TABLET | Freq: Every day | ORAL | 0 refills | Status: DC

## 2018-02-27 MED ORDER — MUPIROCIN 2 % EX OINT: 1.0000 "application " | TOPICAL_OINTMENT | Freq: Two times a day (BID) | CUTANEOUS | 0 refills | Status: AC

## 2018-02-27 MED ORDER — PREDNISONE 50 MG PO TABS
50.0000 mg | ORAL_TABLET | Freq: Every day | ORAL | Status: DC
  Administered 2018-02-28: 50 mg via ORAL
  Filled 2018-02-27: qty 1

## 2018-02-27 NOTE — Clinical Social Work Note (Addendum)
CSW contacted [facility] to discuss possible discharge for today. The CSW spoke with Misty Stanley who shared that a nurse would have to assess the patient for readmission as it has been well over 48 hours since she left the facility. CSW has sent requested documentation to the facility (most recent progress note and FL2). CSW will update on possible discharge when more information is available.  UPDATE: [facility] cannot accept the patient today due to need for reassessment by the DON. They will assess the patient first thing in the morning. CSW updated medical and care management team. CSW is following for discharge tomorrow.  Sheila Hayes, MSW, [facility] [phone]

## 2018-02-27 NOTE — Care Management (Signed)
RNCM consulted for [facility] Heart Failure screening. Patient would be willing to participate in REDS vest program via [facility]. I have placed form that requires MD signature on the chart. Heads up referral placed with Rosey Bath at [facility].

## 2018-02-27 NOTE — Discharge Summary (Signed)
[facility] [place] at [facility]   PATIENT NAME: Sheila Hayes    MR#:  [id number]  DATE OF BIRTH:  1940-11-20  DATE OF ADMISSION:  02/23/2018 ADMITTING PHYSICIAN: Oralia Manis, MD  DATE OF DISCHARGE:  02/27/18  PRIMARY CARE PHYSICIAN: [facility]    ADMISSION DIAGNOSIS:  COPD exacerbation (HCC) [J44.1] Altered mental status, unspecified altered mental status type [R41.82]  DISCHARGE DIAGNOSIS:  Principal Problem:   Acute respiratory failure with hypoxia and hypercapnia (HCC) Active Problems:   COPD exacerbation (HCC)   HTN (hypertension)   Diabetes (HCC)   HLD (hyperlipidemia)   Hypothyroidism   Altered mental status   Acute on chronic diastolic CHF (congestive heart failure) (HCC)   SECONDARY DIAGNOSIS:   Past Medical History:  Diagnosis Date  . Chronic diastolic congestive heart failure (HCC)   . COPD (chronic obstructive pulmonary disease) (HCC)   . Diabetes mellitus without complication (HCC)   . Hyperlipidemia   . Hypertension   . Hypothyroidism   . Morbid obesity (HCC)   . Pulmonary hypertension (HCC)     HOSPITAL COURSE:    Acute hypoxic respiratory failure secondary to acute COPD exacerbation- off BiPAP and stable on [place]  Clinically feeling much better, weaned off to chronic 3 L of home oxygen -Taper IV steroids to PO -  azithromycin course completed - dulera, duonebs prn - wean O2 as able  Chronic diastolic heart failure- stable - continue lasix 20mg  po daily chf vest to prevent future readmissions  Hypertension- normotensive today - continue home lopressor  Type 2 diabetes- blood sugars elevated on steroids -Resume home meds metformin and glipizide and monitor patient's blood sugar closely while on steroids  Hypothyroidism- stable - continue home synthroid  Hyperlipidemia- stable - continue home crestor  Dc pt to [facility] DISCHARGE CONDITIONS:   stable  CONSULTS OBTAINED:   Treatment Team:  [facility], [facility], MD   PROCEDURES  None   DRUG ALLERGIES:  No Known Allergies  DISCHARGE MEDICATIONS:   Allergies as of 02/27/2018   No Known Allergies     Medication List    STOP taking these medications   predniSONE 10 MG tablet Commonly known as:  DELTASONE Replaced by:  predniSONE 10 MG (21) Tbpk tablet     TAKE these medications   acetaminophen 500 MG tablet Commonly known as:  TYLENOL Take 500 mg by mouth every 6 (six) hours as needed for headache.   albuterol 108 (90 Base) MCG/ACT inhaler Commonly known as:  PROVENTIL HFA;VENTOLIN HFA Inhale 2 puffs into the lungs every 6 (six) hours as needed for wheezing or shortness of breath.   budesonide-formoterol 80-4.5 MCG/ACT inhaler Commonly known as:  SYMBICORT Inhale 2 puffs into the lungs 2 (two) times daily.   calcium carbonate 1500 (600 Ca) MG Tabs tablet Commonly known as:  OSCAL Take 600 mg of elemental calcium by mouth 2 (two) times daily with a meal.   citalopram 40 MG tablet Commonly known as:  CELEXA Take 40 mg by mouth daily.   docusate sodium 100 MG capsule Commonly known as:  COLACE Take 200 mg by mouth at bedtime.   furosemide 20 MG tablet Commonly known as:  LASIX Take 20 mg by mouth daily.   gabapentin 100 MG capsule Commonly known as:  NEURONTIN Take 200 mg by mouth 2 (two) times daily.   glipiZIDE 10 MG tablet Commonly known as:  GLUCOTROL Take 10 mg by mouth daily before breakfast.   levothyroxine 125 MCG  tablet Commonly known as:  SYNTHROID, LEVOTHROID Take 125 mcg by mouth daily before breakfast.   metFORMIN 500 MG tablet Commonly known as:  GLUCOPHAGE Take 1,000 mg by mouth 2 (two) times daily with a meal.   metoprolol tartrate 25 MG tablet Commonly known as:  LOPRESSOR Take 0.5 tablets (12.5 mg total) by mouth 2 (two) times daily. What changed:  additional instructions   mupirocin ointment 2 % Commonly known as:  BACTROBAN Place 1 application into  the nose 2 (two) times daily for 2 days.   nystatin powder Generic drug:  nystatin Apply topically 2 (two) times daily as needed. For fungal growth   omeprazole 20 MG capsule Commonly known as:  PRILOSEC Take 20 mg by mouth daily.   potassium chloride 10 MEQ tablet Commonly known as:  K-DUR Take 10 mEq by mouth daily.   predniSONE 10 MG (21) Tbpk tablet Commonly known as:  STERAPRED UNI-PAK 21 TAB Take 1 tablet (10 mg total) by mouth daily. Take 6 tablets by mouth for 1 day followed by  5 tablets by mouth for 1 day followed by  4 tablets by mouth for 1 day followed by  3 tablets by mouth for 1 day followed by  2 tablets by mouth for 1 day followed by  1 tablet by mouth for a day and stop Replaces:  predniSONE 10 MG tablet   rosuvastatin 40 MG tablet Commonly known as:  CRESTOR Take 40 mg by mouth every evening.   tiotropium 18 MCG inhalation capsule Commonly known as:  SPIRIVA Place 18 mcg into inhaler and inhale daily.        DISCHARGE INSTRUCTIONS:   Follow-up with primary care physician in 3 days Follow-up with [facility] [place] cardiology in a week Follow-up with pulmonology Dr. Nicholos Johns in a week Continue 3 L of home oxygen Home health with CHF vest  DIET:  Cardiac diet and Diabetic diet  DISCHARGE CONDITION:  Stable  ACTIVITY:  Activity as tolerated  OXYGEN:  Home Oxygen: Yes.     Oxygen Delivery: 3 liters/min via Patient connected to nasal cannula oxygen  DISCHARGE LOCATION:  [facility]  If you experience worsening of your admission symptoms, develop shortness of breath, life threatening emergency, suicidal or homicidal thoughts you must seek medical attention immediately by calling 911 or calling your MD immediately  if symptoms less severe.  You Must read complete instructions/literature along with all the possible adverse reactions/side effects for all the Medicines you take and that have been prescribed to you. Take any new  Medicines after you have completely understood and accpet all the possible adverse reactions/side effects.   Please note  You were cared for by a hospitalist during your hospital stay. If you have any questions about your discharge medications or the care you received while you were in the hospital after you are discharged, you can call the unit and asked to speak with the hospitalist on call if the hospitalist that took care of you is not available. Once you are discharged, your primary care physician will handle any further medical issues. Please note that NO REFILLS for any discharge medications will be authorized once you are discharged, as it is imperative that you return to your primary care physician (or establish a relationship with a primary care physician if you do not have one) for your aftercare needs so that they can reassess your need for medications and monitor your lab values.     Today  Chief Complaint  Patient presents with  . Altered Mental Status   Patient is feeling much better.  Out of bed and going to the bathroom without any difficulty.  Shortness of breath is well controlled.  Wants to go back to [place] today.  Chronically lives on 3 L of oxygen  ROS:  CONSTITUTIONAL: Denies fevers, chills. Denies any fatigue, weakness.  EYES: Denies blurry vision, double vision, eye pain. EARS, NOSE, THROAT: Denies tinnitus, ear pain, hearing loss. RESPIRATORY: Denies cough, wheeze, shortness of breath.  CARDIOVASCULAR: Denies chest pain, palpitations, edema.  GASTROINTESTINAL: Denies nausea, vomiting, diarrhea, abdominal pain. Denies bright red blood per rectum. GENITOURINARY: Denies dysuria, hematuria. ENDOCRINE: Denies nocturia or thyroid problems. HEMATOLOGIC AND LYMPHATIC: Denies easy bruising or bleeding. SKIN: Denies rash or lesion. MUSCULOSKELETAL: Denies pain in neck, back, shoulder, knees, hips or arthritic symptoms.  NEUROLOGIC: Denies paralysis, paresthesias.   PSYCHIATRIC: Denies anxiety or depressive symptoms.   VITAL SIGNS:  Blood pressure 127/72, pulse (!) 54, temperature 97.7 F (36.5 C), temperature source Oral, resp. rate 16, height 5\' 6"  (1.676 m), weight 106.9 kg, SpO2 99 %.  I/O:    Intake/Output Summary (Last 24 hours) at 02/27/2018 1231 Last data filed at 02/27/2018 0001 Gross per 24 hour  Intake 240 ml  Output 2050 ml  Net -1810 ml    PHYSICAL EXAMINATION:  GENERAL:  77 y.o.-year-old patient lying in the bed with no acute distress.  EYES: Pupils equal, round, reactive to light and accommodation. No scleral icterus. Extraocular muscles intact.  HEENT: Head atraumatic, normocephalic. Oropharynx and nasopharynx clear.  NECK:  Supple, no jugular venous distention. No thyroid enlargement, no tenderness.  LUNGS: Normal breath sounds bilaterally, no wheezing, rales,rhonchi or crepitation. No use of accessory muscles of respiration.  CARDIOVASCULAR: S1, S2 normal. No murmurs, rubs, or gallops.  ABDOMEN: Soft, non-tender, non-distended. Bowel sounds present. No organomegaly or mass.  EXTREMITIES: No pedal edema, cyanosis, or clubbing.  NEUROLOGIC: Cranial nerves II through XII are intact. Muscle strength 5/5 in all extremities. Sensation intact. Gait not checked.  PSYCHIATRIC: The patient is alert and oriented x 3.  SKIN: No obvious rash, lesion, or ulcer.   DATA REVIEW:   CBC Recent Labs  Lab 02/26/18 0505  WBC 5.6  HGB 10.9*  HCT 33.1*  PLT 228    Chemistries  Recent Labs  Lab 02/23/18 1800  02/26/18 0505  NA 142   < > 140  K 3.7   < > 3.9  CL 90*   < > 91*  CO2 43*   < > 40*  GLUCOSE 89   < > 196*  BUN 11   < > 22  CREATININE 0.72   < > 0.82  CALCIUM 8.8*   < > 8.7*  MG  --   --  2.3  AST 12*  --   --   ALT 9  --   --   ALKPHOS 43  --   --   BILITOT 0.7  --   --    < > = values in this interval not displayed.    Cardiac Enzymes Recent Labs  Lab 02/23/18 1800  TROPONINI <0.03    Microbiology Results   Results for orders placed or performed during the hospital encounter of 02/23/18  Urine Culture     Status: None   Collection Time: 02/23/18  6:00 PM  Result Value Ref Range Status   Specimen Description   Final    URINE, CATHETERIZED Performed at [facility], [address], [place], [place] [address]    Special Requests   Final    NONE Performed at [facility], [address] [place], [place] [address]    Culture   Final    NO GROWTH Performed at [facility], [address] [place], [place] [address]    Report Status 02/25/2018 FINAL  Final  MRSA PCR Screening     Status: Abnormal   Collection Time: 02/23/18 11:21 PM  Result Value Ref Range Status   MRSA by PCR POSITIVE (A) NEGATIVE Final    Comment:        The GeneXpert MRSA Assay (FDA approved for NASAL specimens only), is one component of a comprehensive MRSA colonization surveillance program. It is not intended to diagnose MRSA infection nor to guide or monitor treatment for MRSA infections. CRITICAL RESULT CALLED TO, READ BACK BY AND VERIFIED WITH: C/ MISTY CAUSY @0036  02/24/18 [facility] Performed at [facility], [address] [place] [place], [place] [address]     RADIOLOGY:  Dg Chest 1 View  Result Date: 02/23/2018 CLINICAL DATA:  Shortness of breath and wheezing EXAM: CHEST  1 VIEW COMPARISON:  01/30/2018 FINDINGS: Unchanged cardiomegaly. There is mild interstitial pulmonary edema. No focal airspace consolidation. No pleural effusion or pneumothorax. IMPRESSION: Cardiomegaly and mild interstitial pulmonary edema. Electronically Signed   By: Deatra Robinson M.D.   On: 02/23/2018 18:54   Ct Head Wo Contrast  Result Date: 02/23/2018 CLINICAL DATA:  77 year old female with altered mental status. Hypoxia. EXAM: CT HEAD WITHOUT CONTRAST TECHNIQUE: Contiguous axial images were obtained from the base of the skull through the vertex without intravenous contrast. COMPARISON:  Head CT without  contrast 01/30/2018 and earlier. FINDINGS: Brain: Small right vertex dural calcification versus small 8 millimeter calcified meningioma, stable since 2018. No associated mass effect or edema. Normal for age cerebral volume and gray-white matter differentiation. No midline shift, ventriculomegaly, mass effect, intracranial hemorrhage or evidence of cortically based acute infarction. Vascular: Mild Calcified atherosclerosis at the skull base. No suspicious intracranial vascular hyperdensity. Skull: No acute osseous abnormality identified. Hyperostosis frontalis (normal variant). Sinuses/Orbits: Visualized paranasal sinuses and mastoids are stable and well pneumatized. Other: No acute orbit or scalp soft tissue findings. 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 Electronically Signed   By: Odessa Fleming M.D.   On: 02/23/2018 18:55   Dg Chest Port 1 View  Result Date: 02/26/2018 CLINICAL DATA:  Atelectasis EXAM: PORTABLE CHEST 1 VIEW COMPARISON:  February 23, 2018 FINDINGS: Mild cardiomegaly. The hila and mediastinum are unremarkable. No pneumothorax. No pulmonary nodules or masses. No focal infiltrates. IMPRESSION: No active disease. Electronically Signed   By: Gerome Sam III M.D   On: 02/26/2018 08:38    EKG:   Orders placed or performed during the hospital encounter of 02/23/18  . ED EKG  . ED EKG      Management plans discussed with the patient, family and they are in agreement.  CODE STATUS:     Code Status Orders  (From admission, onward)         Start     Ordered   02/23/18 2252  Full code  Continuous     02/23/18 2253        Code Status History    Date Active Date Inactive Code Status Order ID Comments User Context   01/30/2018 2146 02/02/2018 1759 Full Code [id number]  Mayo, Orpha Bur  Pollie Friar, MD Inpatient   10/09/2017 2206 10/11/2017 1928 Full Code [id number]  Oralia Manis, MD  Inpatient   05/13/2016 1117 05/20/2016 2022 Full Code [id number]  Eugenie Norrie, NP ED      TOTAL TIME TAKING CARE OF THIS PATIENT: 45 minutes.   Note: This dictation was prepared with Dragon dictation along with smaller phrase technology. Any transcriptional errors that result from this process are unintentional.   @M[facility] @  on 02/27/2018 at 12:31 PM  Between 7am to 6pm - Pager - [phone]  After 6pm go to [email or web address] - password [email or web address] [facility]  [facility]  Office  [phone]  CC: Primary care physician; [facility]

## 2018-02-27 NOTE — Discharge Instructions (Signed)
Follow-up with primary care physician in 3 days Follow-up with [facility] cardiology in a week Follow-up with pulmonology Dr. Nicholos Johns in a week Continue 3 L of home oxygen Home health with CHF vest

## 2018-02-27 NOTE — Plan of Care (Signed)

## 2018-02-28 LAB — GLUCOSE, CAPILLARY: GLUCOSE-CAPILLARY: 103 mg/dL — AB (ref 70–99)

## 2018-02-28 NOTE — Discharge Summary (Signed)
[facility] - [place] at [facility]   PATIENT NAME: Sheila Hayes    MR#:  [id number]  DATE OF BIRTH:  [birth year]  DATE OF ADMISSION:  02/23/2018 ADMITTING PHYSICIAN: Oralia Manis, MD  DATE OF DISCHARGE:  02/28/18  PRIMARY CARE PHYSICIAN: [facility]    ADMISSION DIAGNOSIS:  COPD exacerbation (HCC) [J44.1] Altered mental status, unspecified altered mental status type [R41.82]  DISCHARGE DIAGNOSIS:  Principal Problem:   Acute respiratory failure with hypoxia and hypercapnia (HCC) Active Problems:   COPD exacerbation (HCC)   HTN (hypertension)   Diabetes (HCC)   HLD (hyperlipidemia)   Hypothyroidism   Altered mental status   Acute on chronic diastolic CHF (congestive heart failure) (HCC)   SECONDARY DIAGNOSIS:   Past Medical History:  Diagnosis Date  . Chronic diastolic congestive heart failure (HCC)   . COPD (chronic obstructive pulmonary disease) (HCC)   . Diabetes mellitus without complication (HCC)   . Hyperlipidemia   . Hypertension   . Hypothyroidism   . Morbid obesity (HCC)   . Pulmonary hypertension (HCC)     HOSPITAL COURSE:    Acute hypoxic respiratory failure secondary to acute COPD exacerbation- off BiPAP and stable on [place]  Clinically feeling much better, weaned off to chronic 3 L of home oxygen -Taper IV steroids to PO -  azithromycin course completed - dulera, duonebs prn - wean O2 as able  Chronic diastolic heart failure- stable - continue lasix 20mg  po daily chf vest to prevent future readmissions  Hypertension- normotensive today - continue home lopressor  Type 2 diabetes- blood sugars elevated on steroids -Resume home meds metformin and glipizide and monitor patient's blood sugar closely while on steroids  Hypothyroidism- stable - continue home synthroid  Hyperlipidemia- stable - continue home crestor  Dc pt to [facility] DISCHARGE CONDITIONS:   stable  CONSULTS OBTAINED:   Treatment Team:  [facility], [facility], MD   PROCEDURES  None   DRUG ALLERGIES:  No Known Allergies  DISCHARGE MEDICATIONS:   Allergies as of 02/28/2018   No Known Allergies     Medication List    STOP taking these medications   predniSONE 10 MG tablet Commonly known as:  DELTASONE Replaced by:  predniSONE 10 MG (21) Tbpk tablet     TAKE these medications   acetaminophen 500 MG tablet Commonly known as:  TYLENOL Take 500 mg by mouth every 6 (six) hours as needed for headache.   albuterol 108 (90 Base) MCG/ACT inhaler Commonly known as:  PROVENTIL HFA;VENTOLIN HFA Inhale 2 puffs into the lungs every 6 (six) hours as needed for wheezing or shortness of breath.   budesonide-formoterol 80-4.5 MCG/ACT inhaler Commonly known as:  SYMBICORT Inhale 2 puffs into the lungs 2 (two) times daily.   calcium carbonate 1500 (600 Ca) MG Tabs tablet Commonly known as:  OSCAL Take 600 mg of elemental calcium by mouth 2 (two) times daily with a meal.   citalopram 40 MG tablet Commonly known as:  CELEXA Take 40 mg by mouth daily.   docusate sodium 100 MG capsule Commonly known as:  COLACE Take 200 mg by mouth at bedtime.   furosemide 20 MG tablet Commonly known as:  LASIX Take 20 mg by mouth daily.   gabapentin 100 MG capsule Commonly known as:  NEURONTIN Take 200 mg by mouth 2 (two) times daily.   glipiZIDE 10 MG tablet Commonly known as:  GLUCOTROL Take 10 mg by mouth daily before breakfast.   levothyroxine 125 MCG  tablet Commonly known as:  SYNTHROID, LEVOTHROID Take 125 mcg by mouth daily before breakfast.   metFORMIN 500 MG tablet Commonly known as:  GLUCOPHAGE Take 1,000 mg by mouth 2 (two) times daily with a meal.   metoprolol tartrate 25 MG tablet Commonly known as:  LOPRESSOR Take 0.5 tablets (12.5 mg total) by mouth 2 (two) times daily. What changed:  additional instructions   mupirocin ointment 2 % Commonly known as:  BACTROBAN Place 1 application into  the nose 2 (two) times daily for 2 days.   nystatin powder Generic drug:  nystatin Apply topically 2 (two) times daily as needed. For fungal growth   omeprazole 20 MG capsule Commonly known as:  PRILOSEC Take 20 mg by mouth daily.   potassium chloride 10 MEQ tablet Commonly known as:  K-DUR Take 10 mEq by mouth daily.   predniSONE 10 MG (21) Tbpk tablet Commonly known as:  STERAPRED UNI-PAK 21 TAB Take 1 tablet (10 mg total) by mouth daily. Take 6 tablets by mouth for 1 day followed by  5 tablets by mouth for 1 day followed by  4 tablets by mouth for 1 day followed by  3 tablets by mouth for 1 day followed by  2 tablets by mouth for 1 day followed by  1 tablet by mouth for a day and stop Replaces:  predniSONE 10 MG tablet   rosuvastatin 40 MG tablet Commonly known as:  CRESTOR Take 40 mg by mouth every evening.   tiotropium 18 MCG inhalation capsule Commonly known as:  SPIRIVA Place 18 mcg into inhaler and inhale daily.            Durable Medical Equipment  (From admission, onward)         Start     Ordered   02/27/18 1237  Heart failure home health orders  (Heart failure home health orders / Face to face)  Once    Comments:  Heart Failure Follow-up Care: Dr. Gala Romney [facility] program Verify follow-up appointments per Patient Discharge Instructions. Confirm transportation arranged. Reconcile home medications with discharge medication list. Remove discontinued medications from use. Assist patient/caregiver to manage medications using pill box. Reinforce low sodium food selection Assessments: Vital signs and oxygen saturation at each visit. Assess home environment for safety concerns, caregiver support and availability of low-sodium foods. Consult Child psychotherapist, PT/OT, Dietitian, and CNA based on assessments. Perform comprehensive cardiopulmonary assessment. Notify MD for any change in condition or weight gain of 3 pounds in one day or 5 pounds in one week with  symptoms. Daily Weights and Symptom Monitoring: Ensure patient has access to scales. Teach patient/caregiver to weigh daily before breakfast and after voiding using same scale and record.    Teach patient/caregiver to track weight and symptoms and when to notify Provider. Activity: Develop individualized activity plan with patient/caregiver.  Question Answer Comment  Heart Failure Follow-up Care Or per Doctor (see comments)   Obtain the following labs Basic Metabolic Panel   Lab frequency Other see comments   Fax lab results to Other see comments   Diet Low Sodium Heart Healthy   Fluid restrictions: 1800 mL Fluid      02/27/18 1240           DISCHARGE INSTRUCTIONS:   Follow-up with primary care physician in 3 days Follow-up with [facility] cardiology in a week Follow-up with pulmonology Dr. Nicholos Johns in a week Continue 3 L of home oxygen Home health with CHF vest  DIET:  Cardiac diet and  Diabetic diet  DISCHARGE CONDITION:  Stable  ACTIVITY:  Activity as tolerated  OXYGEN:  Home Oxygen: Yes.     Oxygen Delivery: 3 liters/min via Patient connected to nasal cannula oxygen  DISCHARGE LOCATION:  [facility]  If you experience worsening of your admission symptoms, develop shortness of breath, life threatening emergency, suicidal or homicidal thoughts you must seek medical attention immediately by calling 911 or calling your MD immediately  if symptoms less severe.  You Must read complete instructions/literature along with all the possible adverse reactions/side effects for all the Medicines you take and that have been prescribed to you. Take any new Medicines after you have completely understood and accpet all the possible adverse reactions/side effects.   Please note  You were cared for by a hospitalist during your hospital stay. If you have any questions about your discharge medications or the care you received while you were in the hospital after  you are discharged, you can call the unit and asked to speak with the hospitalist on call if the hospitalist that took care of you is not available. Once you are discharged, your primary care physician will handle any further medical issues. Please note that NO REFILLS for any discharge medications will be authorized once you are discharged, as it is imperative that you return to your primary care physician (or establish a relationship with a primary care physician if you do not have one) for your aftercare needs so that they can reassess your need for medications and monitor your lab values.     Today  Chief Complaint  Patient presents with  . Altered Mental Status   Patient is feeling much better.  Out of bed and walking the hallway without any difficulty.  Shortness of breath improved and patient is back to her baseline.  Wants to go back to [place] today.  Chronically lives on 3 L of oxygen  ROS:  CONSTITUTIONAL: Denies fevers, chills. Denies any fatigue, weakness.  EYES: Denies blurry vision, double vision, eye pain. EARS, NOSE, THROAT: Denies tinnitus, ear pain, hearing loss. RESPIRATORY: Denies cough, wheeze, shortness of breath.  CARDIOVASCULAR: Denies chest pain, palpitations, edema.  GASTROINTESTINAL: Denies nausea, vomiting, diarrhea, abdominal pain. Denies bright red blood per rectum. GENITOURINARY: Denies dysuria, hematuria. ENDOCRINE: Denies nocturia or thyroid problems. HEMATOLOGIC AND LYMPHATIC: Denies easy bruising or bleeding. SKIN: Denies rash or lesion. MUSCULOSKELETAL: Denies pain in neck, back, shoulder, knees, hips or arthritic symptoms.  NEUROLOGIC: Denies paralysis, paresthesias.  PSYCHIATRIC: Denies anxiety or depressive symptoms.   VITAL SIGNS:  Blood pressure (!) 111/58, pulse (!) 54, temperature 97.7 F (36.5 C), temperature source Oral, resp. rate 18, height 5\' 6"  (1.676 m), weight 108.3 kg, SpO2 97 %.  I/O:    Intake/Output Summary (Last 24 hours)  at 02/28/2018 0906 Last data filed at 02/28/2018 0500 Gross per 24 hour  Intake 0 ml  Output 900 ml  Net -900 ml    PHYSICAL EXAMINATION:  GENERAL:  77 y.o.-year-old patient lying in the bed with no acute distress.  EYES: Pupils equal, round, reactive to light and accommodation. No scleral icterus. Extraocular muscles intact.  HEENT: Head atraumatic, normocephalic. Oropharynx and nasopharynx clear.  NECK:  Supple, no jugular venous distention. No thyroid enlargement, no tenderness.  LUNGS: Normal breath sounds bilaterally, no wheezing, rales,rhonchi or crepitation. No use of accessory muscles of respiration.  CARDIOVASCULAR: S1, S2 normal. No murmurs, rubs, or gallops.  ABDOMEN: Soft, non-tender, non-distended. Bowel sounds present. No organomegaly or mass.  EXTREMITIES: No pedal edema, cyanosis, or clubbing.  NEUROLOGIC: Cranial nerves II through XII are intact. Muscle strength 5/5 in all extremities. Sensation intact. Gait not checked.  PSYCHIATRIC: The patient is alert and oriented x 3.  SKIN: No obvious rash, lesion, or ulcer.   DATA REVIEW:   CBC Recent Labs  Lab 02/26/18 0505  WBC 5.6  HGB 10.9*  HCT 33.1*  PLT 228    Chemistries  Recent Labs  Lab 02/23/18 1800  02/26/18 0505  NA 142   < > 140  K 3.7   < > 3.9  CL 90*   < > 91*  CO2 43*   < > 40*  GLUCOSE 89   < > 196*  BUN 11   < > 22  CREATININE 0.72   < > 0.82  CALCIUM 8.8*   < > 8.7*  MG  --   --  2.3  AST 12*  --   --   ALT 9  --   --   ALKPHOS 43  --   --   BILITOT 0.7  --   --    < > = values in this interval not displayed.    Cardiac Enzymes Recent Labs  Lab 02/23/18 1800  TROPONINI <0.03    Microbiology Results  Results for orders placed or performed during the hospital encounter of 02/23/18  Urine Culture     Status: None   Collection Time: 02/23/18  6:00 PM  Result Value Ref Range Status   Specimen Description   Final    URINE, CATHETERIZED Performed at [facility], [address] [place], [place] [address]    Special Requests   Final    NONE Performed at [facility], [address], [place], [place] [address]    Culture   Final    NO GROWTH Performed at [facility], [address], [place], [place] [address]    Report Status 02/25/2018 FINAL  Final  MRSA PCR Screening     Status: Abnormal   Collection Time: 02/23/18 11:21 PM  Result Value Ref Range Status   MRSA by PCR POSITIVE (A) NEGATIVE Final    Comment:        The GeneXpert MRSA Assay (FDA approved for NASAL specimens only), is one component of a comprehensive MRSA colonization surveillance program. It is not intended to diagnose MRSA infection nor to guide or monitor treatment for MRSA infections. CRITICAL RESULT CALLED TO, READ BACK BY AND VERIFIED WITH: C/ MISTY CAUSY @0036  02/24/18 [facility] Performed at [facility], [address] [place] [place], [place] [address]     RADIOLOGY:  Dg Chest Port 1 View  Result Date: 02/26/2018 CLINICAL DATA:  Atelectasis EXAM: PORTABLE CHEST 1 VIEW COMPARISON:  February 23, 2018 FINDINGS: Mild cardiomegaly. The hila and mediastinum are unremarkable. No pneumothorax. No pulmonary nodules or masses. No focal infiltrates. IMPRESSION: No active disease. Electronically Signed   By: Gerome Sam III M.D   On: 02/26/2018 08:38    EKG:   Orders placed or performed during the hospital encounter of 02/23/18  . ED EKG  . ED EKG      Management plans discussed with the patient, family and they are in agreement.  CODE STATUS:     Code Status Orders  (From admission, onward)         Start     Ordered   02/23/18 2252  Full code  Continuous     02/23/18  2253        Code Status History    Date Active Date Inactive Code Status Order ID Comments User Context   01/30/2018 2146 02/02/2018 1759 Full Code [id number]  Campbell Stall, MD Inpatient   10/09/2017 2206 10/11/2017 1928 Full Code [id number]  Oralia Manis, MD Inpatient    05/13/2016 1117 05/20/2016 2022 Full Code [id number]  Eugenie Norrie, NP ED      TOTAL TIME TAKING CARE OF THIS PATIENT: 45 minutes.   Note: This dictation was prepared with Dragon dictation along with smaller phrase technology. Any transcriptional errors that result from this process are unintentional.   @[facility] @  on 02/28/2018 at 9:06 AM  Between 7am to 6pm - Pager - [phone]  After 6pm go to [email or web address] - password [email or web address] [facility]  [facility]  Office  [phone]  CC: Primary care physician; [facility]

## 2018-02-28 NOTE — Clinical Social Work Note (Signed)
CSW spoke with Susa Raring at [facility] [phone] and they can accept patient back today, patient to be d/c'ed today to [facility].  Patient and family agreeable to plans will transport via facility transportation, RN does not need to call report, facility will bring portable oxygen tank.  Windell Moulding, MSW, Theresia Majors [phone]

## 2018-02-28 NOTE — Care Management (Signed)
Patient to discharge back to [facility].  Has facility provided transportation services. Follow up appointments have been schedule.  No issues accessing mediations.  Able to weigh daily. Patient is a full code and no palliative services ordered.  Notified [facility] of discharged and provided with signed heart failure protocol

## 2018-02-28 NOTE — Progress Notes (Signed)
[facility] - [place] at [facility]   PATIENT NAME: Sheila Hayes    MR#:  [id number]  DATE OF BIRTH:  [birth year]  SUBJECTIVE:  Feeling better this morning.  Shortness of breath is improving Transferred to floor from ICU on 02/25/2018  REVIEW OF SYSTEMS:  Review of Systems  Constitutional: Negative for chills and fever.  HENT: Negative for congestion and sore throat.   Respiratory: Negative for cough and shortness of breath.   Cardiovascular: Negative for chest pain, palpitations and leg swelling.  Gastrointestinal: Negative for abdominal pain, nausea and vomiting.  Genitourinary: Negative for dysuria and frequency.  Musculoskeletal: Negative for back pain and neck pain.  Skin: Negative.   Neurological: Negative for dizziness and headaches.  Psychiatric/Behavioral: Negative for depression. The patient is not nervous/anxious.     DRUG ALLERGIES:  No Known Allergies VITALS:  Blood pressure (!) 111/58, pulse (!) 54, temperature 97.7 F (36.5 C), temperature source Oral, resp. rate 18, height 5\' 6"  (1.676 m), weight 108.3 kg, SpO2 97 %. PHYSICAL EXAMINATION:  Physical Exam  Constitutional: She appears well-developed and well-nourished. No distress.  HEENT: Normocephalic and atraumatic. Oropharynx is clear and moist. Pupils are equal, round, and reactive to light. Conjunctivae and EOM are normal. No scleral icterus.  Neck: Normal range of motion. Neck supple. No JVD present. No thyromegaly present.  Cardiovascular: Normal rate, regular rhythm and intact distal pulses. No murmurs, rubs, or gallops. Respiratory: Effort normal. No respiratory distress. +expiratory wheezing present throughout all lung fields. No crackles. [place] in place. GI: +BS, soft, non-tender, non-distended Musculoskeletal: Normal range of motion. She exhibits no edema.  Neurological: CN 2-12 intact, no focal deficits. Skin: Skin is warm and dry. No rash noted. No erythema.  Psychiatric: Alert and  oriented x 3. Normal thought content. LABORATORY PANEL:  Female CBC Recent Labs  Lab 02/26/18 0505  WBC 5.6  HGB 10.9*  HCT 33.1*  PLT 228   ------------------------------------------------------------------------------------------------------------------ Chemistries  Recent Labs  Lab 02/23/18 1800  02/26/18 0505  NA 142   < > 140  K 3.7   < > 3.9  CL 90*   < > 91*  CO2 43*   < > 40*  GLUCOSE 89   < > 196*  BUN 11   < > 22  CREATININE 0.72   < > 0.82  CALCIUM 8.8*   < > 8.7*  MG  --   --  2.3  AST 12*  --   --   ALT 9  --   --   ALKPHOS 43  --   --   BILITOT 0.7  --   --    < > = values in this interval not displayed.   RADIOLOGY:  No results found. ASSESSMENT AND PLAN:   Acute hypoxic respiratory failure secondary to acute COPD exacerbation- off BiPAP and stable on [place]  -Taper IV steroids - continue azithromycin - dulera, duonebs prn - wean O2 as able  Chronic diastolic heart failure- stable - continue lasix 20mg  po daily  Hypertension- normotensive today - continue home lopressor  Type 2 diabetes- blood sugars elevated on steroids - sensitive SSI  Hypothyroidism- stable - continue home synthroid  Hyperlipidemia- stable - continue home crestor   All the records are reviewed and case discussed with Care Management/Social Worker. Management plans discussed with the patient, family and they are in agreement.  CODE STATUS: Full Code  TOTAL TIME TAKING CARE OF THIS PATIENT: 33 minutes.   More than  50% of the time was spent in counseling/coordination of care: YES  POSSIBLE D/C IN 1- DAYS, DEPENDING ON CLINICAL CONDITION.   Ramonita Lab M.D on 02/28/2018 at 9:07 AM  Between 7am to 6pm - Pager - [phone]  After 6pm go to [email or web address] - Social research officer, government  [facility]  Office  [phone]  CC: Primary care physician; [facility]  Note: This dictation was prepared with Dragon dictation along with  smaller phrase technology. Any transcriptional errors that result from this process are unintentional.

## 2018-02-28 NOTE — [place] FL2 (Signed)
[place] MEDICAID FL2 LEVEL OF CARE SCREENING TOOL     IDENTIFICATION  Patient Name: Sheila Hayes Birthdate: 11/28/1940 Sex: female Admission Date (Current Location): 02/23/2018  [place] and [place] Number:  [address] [id number] K Facility and Address:  [facility], [address], [place], [place] [address]      Provider Number: [id number]  Attending Physician Name and Address:  Ramonita Lab, MD  Relative Name and Phone Number:  Eustace Quail (niece) [phone]    Current Level of Care: Hospital Recommended Level of Care: Assisted Living Facility([facility]) Prior Approval Number:    Date Approved/Denied:   PASRR Number:    Discharge Plan: Domiciliary (Rest home)([facility])    Current Diagnoses: Patient Active Problem List   Diagnosis Date Noted  . Acute on chronic diastolic CHF (congestive heart failure) (HCC) 02/23/2018  . Altered mental status   . Urinary tract infection without hematuria   . Acute respiratory failure with hypoxia and hypercapnia (HCC) 01/30/2018  . Near syncope 10/09/2017  . HTN (hypertension) 10/09/2017  . Diabetes (HCC) 10/09/2017  . HLD (hyperlipidemia) 10/09/2017  . Chronic diastolic CHF (congestive heart failure) (HCC) 10/09/2017  . Hypothyroidism 10/09/2017  . COPD exacerbation (HCC) 05/13/2016    Orientation RESPIRATION BLADDER Height & Weight     Self, Situation, Time, Place  O2(2L) Continent Weight: 238 lb 12.8 oz (108.3 kg) Height:  5\' 6"  (167.6 cm)  BEHAVIORAL SYMPTOMS/MOOD NEUROLOGICAL BOWEL NUTRITION STATUS      Continent Diet(Heart Healthy Carb Modified)  AMBULATORY STATUS COMMUNICATION OF NEEDS Skin   Supervision Verbally Normal                       Personal Care Assistance Level of Assistance  Bathing, Feeding, Dressing, Total care Bathing Assistance: Limited assistance Feeding assistance: Independent Dressing Assistance: Limited assistance Total Care Assistance: Independent    Functional Limitations Info  Sight, Hearing, Speech Sight Info: Adequate Hearing Info: Adequate Speech Info: Adequate    SPECIAL CARE FACTORS FREQUENCY  PT (By licensed PT)     PT Frequency: Home Health              Contractures Contractures Info: Not present    Additional Factors Info  Code Status, Allergies, Psychotropic Code Status Info: Full Code Allergies Info: NKA Psychotropic Info: citalopram (CELEXA) tablet 40 mg          Current Medications (02/28/2018):  This is the current hospital active medication list Current Facility-Administered Medications  Medication Dose Route Frequency Provider Last Rate Last Dose  . acetaminophen (TYLENOL) tablet 650 mg  650 mg Oral Q6H PRN Oralia Manis, MD       Or  . acetaminophen (TYLENOL) suppository 650 mg  650 mg Rectal Q6H PRN Oralia Manis, MD      . chlorhexidine (PERIDEX) 0.12 % solution 15 mL  15 mL Mouth Rinse BID Oralia Manis, MD   15 mL at 02/28/18 1045  . citalopram (CELEXA) tablet 40 mg  40 mg Oral Daily Oralia Manis, MD   40 mg at 02/28/18 1045  . enoxaparin (LOVENOX) injection 40 mg  40 mg Subcutaneous Q24H Mayo, Allyn Kenner, MD   40 mg at 02/27/18 2117  . furosemide (LASIX) tablet 20 mg  20 mg Oral Daily Oralia Manis, MD   20 mg at 02/28/18 1046  . gabapentin (NEURONTIN) capsule 200 mg  200 mg Oral BID Oralia Manis, MD   200 mg at 02/28/18 1045  . insulin aspart (novoLOG) injection  0-9 Units  0-9 Units Subcutaneous Q6H Oralia Manis, MD   1 Units at 02/28/18 0007  . ipratropium-albuterol (DUONEB) 0.5-2.5 (3) MG/3ML nebulizer solution 3 mL  3 mL Nebulization Q2H PRN Oralia Manis, MD      . ipratropium-albuterol (DUONEB) 0.5-2.5 (3) MG/3ML nebulizer solution 3 mL  3 mL Nebulization BID Gouru, Aruna, MD   3 mL at 02/28/18 0747  . levothyroxine (SYNTHROID, LEVOTHROID) tablet 125 mcg  125 mcg Oral QAC breakfast Oralia Manis, MD   125 mcg at 02/28/18 0809  . MEDLINE mouth rinse  15 mL Mouth Rinse q12n4p Oralia Manis, MD   15 mL at 02/27/18 1246  . metoprolol tartrate (LOPRESSOR) tablet 12.5 mg  12.5 mg Oral BID Oralia Manis, MD   12.5 mg at 02/28/18 1046  . mometasone-formoterol (DULERA) 100-5 MCG/ACT inhaler 2 puff  2 puff Inhalation BID Oralia Manis, MD   2 puff at 02/28/18 [phone]  . ondansetron (ZOFRAN) tablet 4 mg  4 mg Oral Q6H PRN Oralia Manis, MD       Or  . ondansetron [facility]) injection 4 mg  4 mg Intravenous Q6H PRN Oralia Manis, MD      . pantoprazole (PROTONIX) EC tablet 40 mg  40 mg Oral Daily Oralia Manis, MD   40 mg at 02/28/18 1046  . predniSONE (DELTASONE) tablet 50 mg  50 mg Oral Q breakfast Gouru, Aruna, MD   50 mg at 02/28/18 0810  . rosuvastatin (CRESTOR) tablet 40 mg  40 mg Oral QPM Oralia Manis, MD   40 mg at 02/27/18 2115  . tiotropium (SPIRIVA) inhalation capsule 18 mcg  18 mcg Inhalation Daily Mayo, Allyn Kenner, MD   18 mcg at 02/28/18 0809     Discharge Medications: STOP taking these medications       predniSONE 10 MG tablet Commonly known as:  DELTASONE Replaced by:  predniSONE 10 MG (21) Tbpk tablet             TAKE these medications       acetaminophen 500 MG tablet Commonly known as:  TYLENOL Take 500 mg by mouth every 6 (six) hours as needed for headache.   albuterol 108 (90 Base) MCG/ACT inhaler Commonly known as:  PROVENTIL HFA;VENTOLIN HFA Inhale 2 puffs into the lungs every 6 (six) hours as needed for wheezing or shortness of breath.   budesonide-formoterol 80-4.5 MCG/ACT inhaler Commonly known as:  SYMBICORT Inhale 2 puffs into the lungs 2 (two) times daily.   calcium carbonate 1500 (600 Ca) MG Tabs tablet Commonly known as:  OSCAL Take 600 mg of elemental calcium by mouth 2 (two) times daily with a meal.   citalopram 40 MG tablet Commonly known as:  CELEXA Take 40 mg by mouth daily.   docusate sodium 100 MG capsule Commonly known as:  COLACE Take 200 mg by mouth at bedtime.   furosemide 20 MG tablet Commonly known as:   LASIX Take 20 mg by mouth daily.   gabapentin 100 MG capsule Commonly known as:  NEURONTIN Take 200 mg by mouth 2 (two) times daily.   glipiZIDE 10 MG tablet Commonly known as:  GLUCOTROL Take 10 mg by mouth daily before breakfast.   levothyroxine 125 MCG tablet Commonly known as:  SYNTHROID, LEVOTHROID Take 125 mcg by mouth daily before breakfast.   metFORMIN 500 MG tablet Commonly known as:  GLUCOPHAGE Take 1,000 mg by mouth 2 (two) times daily with a meal.   metoprolol tartrate 25 MG tablet Commonly  known as:  LOPRESSOR Take 0.5 tablets (12.5 mg total) by mouth 2 (two) times daily. What changed:  additional instructions   mupirocin ointment 2 % Commonly known as:  BACTROBAN Place 1 application into the nose 2 (two) times daily for 2 days.   nystatin powder Generic drug:  nystatin Apply topically 2 (two) times daily as needed. For fungal growth   omeprazole 20 MG capsule Commonly known as:  PRILOSEC Take 20 mg by mouth daily.   potassium chloride 10 MEQ tablet Commonly known as:  K-DUR Take 10 mEq by mouth daily.   predniSONE 10 MG (21) Tbpk tablet Commonly known as:  STERAPRED UNI-PAK 21 TAB Take 1 tablet (10 mg total) by mouth daily. Take 6 tablets by mouth for 1 day followed by  5 tablets by mouth for 1 day followed by  4 tablets by mouth for 1 day followed by  3 tablets by mouth for 1 day followed by  2 tablets by mouth for 1 day followed by  1 tablet by mouth for a day and stop Replaces:  predniSONE 10 MG tablet   rosuvastatin 40 MG tablet Commonly known as:  CRESTOR Take 40 mg by mouth every evening.   tiotropium 18 MCG inhalation capsule Commonly known as:  SPIRIVA Place 18 mcg into inhaler and inhale daily.     Relevant Imaging Results:  Relevant Lab Results:   Additional Information SS# [id number]  [place]

## 2018-02-28 NOTE — Care Management Important Message (Signed)
Important Message  Patient Details  Name: Sheila Hayes MRN: [id number] Date of Birth: 1941-04-01   Medicare Important Message Given:  Yes    Eber Hong, RN 02/28/2018, 10:53 AM

## 2018-03-03 ENCOUNTER — Emergency Department
Admission: EM | Admit: 2018-03-03 | Discharge: 2018-03-03 | Disposition: A | Discharge status: Home / Self Care | Payer: Medicare Other | Attending: Emergency Medicine | Admitting: Emergency Medicine

## 2018-03-03 ENCOUNTER — Emergency Department: Payer: Medicare Other

## 2018-03-03 DIAGNOSIS — E119 Type 2 diabetes mellitus without complications: Secondary | ICD-10-CM | POA: Diagnosis not present

## 2018-03-03 DIAGNOSIS — I110 Hypertensive heart disease with heart failure: Secondary | ICD-10-CM | POA: Insufficient documentation

## 2018-03-03 DIAGNOSIS — Z7984 Long term (current) use of oral hypoglycemic drugs: Secondary | ICD-10-CM | POA: Insufficient documentation

## 2018-03-03 DIAGNOSIS — Z87891 Personal history of nicotine dependence: Secondary | ICD-10-CM | POA: Diagnosis not present

## 2018-03-03 DIAGNOSIS — Z79899 Other long term (current) drug therapy: Secondary | ICD-10-CM | POA: Diagnosis not present

## 2018-03-03 DIAGNOSIS — I5033 Acute on chronic diastolic (congestive) heart failure: Secondary | ICD-10-CM | POA: Diagnosis not present

## 2018-03-03 DIAGNOSIS — E039 Hypothyroidism, unspecified: Secondary | ICD-10-CM | POA: Diagnosis not present

## 2018-03-03 DIAGNOSIS — E860 Dehydration: Secondary | ICD-10-CM | POA: Insufficient documentation

## 2018-03-03 DIAGNOSIS — J449 Chronic obstructive pulmonary disease, unspecified: Secondary | ICD-10-CM | POA: Insufficient documentation

## 2018-03-03 DIAGNOSIS — R4182 Altered mental status, unspecified: Secondary | ICD-10-CM | POA: Diagnosis present

## 2018-03-03 LAB — BASIC METABOLIC PANEL
Anion gap: 11 (ref 5–15)
BUN: 17 mg/dL (ref 8–23)
CHLORIDE: 91 mmol/L — AB (ref 98–111)
CO2: 41 mmol/L — ABNORMAL HIGH (ref 22–32)
CREATININE: 0.94 mg/dL (ref 0.44–1.00)
Calcium: 9.6 mg/dL (ref 8.9–10.3)
GFR, EST NON AFRICAN AMERICAN: 57 mL/min — AB (ref >60)
Glucose, Bld: 136 mg/dL — ABNORMAL HIGH (ref 70–99)
POTASSIUM: 4 mmol/L (ref 3.5–5.1)
SODIUM: 143 mmol/L (ref 135–145)

## 2018-03-03 LAB — URINALYSIS, COMPLETE (UACMP) WITH MICROSCOPIC
BILIRUBIN URINE: NEGATIVE
Bacteria, UA: NONE SEEN
GLUCOSE, UA: NEGATIVE mg/dL
KETONES UR: NEGATIVE mg/dL
LEUKOCYTES UA: NEGATIVE
NITRITE: NEGATIVE
PH: 6 (ref 5.0–8.0)
Protein, ur: NEGATIVE mg/dL
SPECIFIC GRAVITY, URINE: 1.009 (ref 1.005–1.030)

## 2018-03-03 LAB — CBC WITH DIFFERENTIAL/PLATELET
BASOS ABS: 0 10*3/uL (ref 0–0.1)
Basophils Relative: 0 %
EOS ABS: 0 10*3/uL (ref 0–0.7)
EOS PCT: 1 %
HCT: 35.8 % (ref 35.0–47.0)
HEMOGLOBIN: 11.7 g/dL — AB (ref 12.0–16.0)
LYMPHS ABS: 1.1 10*3/uL (ref 1.0–3.6)
Lymphocytes Relative: 13 %
MCH: 28.9 pg (ref 26.0–34.0)
MCHC: 32.5 g/dL (ref 32.0–36.0)
MCV: 89 fL (ref 80.0–100.0)
Monocytes Absolute: 0.4 10*3/uL (ref 0.2–0.9)
Monocytes Relative: 5 %
NEUTROS PCT: 81 %
Neutro Abs: 6.9 10*3/uL — ABNORMAL HIGH (ref 1.4–6.5)
PLATELETS: 253 10*3/uL (ref 150–440)
RBC: 4.02 MIL/uL (ref 3.80–5.20)
RDW: 14.6 % — ABNORMAL HIGH (ref 11.5–14.5)
WBC: 8.5 10*3/uL (ref 3.6–11.0)

## 2018-03-03 LAB — BRAIN NATRIURETIC PEPTIDE: B NATRIURETIC PEPTIDE 5: 73 pg/mL (ref 0.0–100.0)

## 2018-03-03 LAB — TROPONIN I

## 2018-03-03 MED ORDER — SODIUM CHLORIDE 0.9 % IV BOLUS
500.0000 mL | Freq: Once | INTRAVENOUS | Status: AC
  Administered 2018-03-03: 500 mL via INTRAVENOUS

## 2018-03-03 MED ORDER — IPRATROPIUM-ALBUTEROL 0.5-2.5 (3) MG/3ML IN SOLN
3.0000 mL | Freq: Once | RESPIRATORY_TRACT | Status: AC
  Administered 2018-03-03: 3 mL via RESPIRATORY_TRACT
  Filled 2018-03-03: qty 3

## 2018-03-03 NOTE — ED Provider Notes (Signed)
[facility] Emergency Department Provider Note ____________________________________________   First MD Initiated Contact with Patient 03/03/18 1149     (approximate)  I have reviewed the triage vital signs and the nursing notes.   HISTORY  Chief Complaint Altered Mental Status   HPI Sheila Hayes is a 77 y.o. female with PMH as noted above who presents with "confusion and tremor" according to the rehab facility report.  This was acute onset today and appears to now be resolved.  In addition the patient was somewhat hypoxic on her normal 2 L of oxygen by nasal cannula, with O2 saturation noted as 88%.  The patient herself has no complaints.  She states that she has been feeling fine and is not sure why she was brought here.  She is oriented x3 denies fevers, weakness, dizziness, increased shortness of breath from her baseline, or any pain anywhere.  Past Medical History:  Diagnosis Date  . Chronic diastolic congestive heart failure (HCC)   . COPD (chronic obstructive pulmonary disease) (HCC)   . Diabetes mellitus without complication (HCC)   . Hyperlipidemia   . Hypertension   . Hypothyroidism   . Morbid obesity (HCC)   . Pulmonary hypertension [facility](HCC)     Patient Active Problem List   Diagnosis Date Noted  . Acute on chronic diastolic CHF (congestive heart failure) (HCC) 02/23/2018  . Altered mental status   . Urinary tract infection without hematuria   . Acute respiratory failure with hypoxia and hypercapnia (HCC) 01/30/2018  . Near syncope 10/09/2017  . HTN (hypertension) 10/09/2017  . Diabetes (HCC) 10/09/2017  . HLD (hyperlipidemia) 10/09/2017  . Chronic diastolic CHF (congestive heart failure) (HCC) 10/09/2017  . Hypothyroidism 10/09/2017  . COPD exacerbation (HCC) 05/13/2016    Past Surgical History:  Procedure Laterality Date  . APPENDECTOMY    . CHOLECYSTECTOMY      Prior to Admission medications   Medication Sig Start Date End  Date Taking? Authorizing Provider  acetaminophen (TYLENOL) 500 MG tablet Take 500 mg by mouth every 6 (six) hours as needed for headache.    [provider]  albuterol (PROVENTIL HFA;VENTOLIN HFA) 108 (90 Base) MCG/ACT inhaler Inhale 2 puffs into the lungs every 6 (six) hours as needed for wheezing or shortness of breath.    [provider]  budesonide-formoterol (SYMBICORT) 80-4.5 MCG/ACT inhaler Inhale 2 puffs into the lungs 2 (two) times daily.    [provider]  calcium carbonate (OSCAL) 1500 (600 Ca) MG TABS tablet Take 600 mg of elemental calcium by mouth 2 (two) times daily with a meal.     [provider]  citalopram (CELEXA) 40 MG tablet Take 40 mg by mouth daily.    [provider]  docusate sodium (COLACE) 100 MG capsule Take 200 mg by mouth at bedtime.    [provider]  furosemide (LASIX) 20 MG tablet Take 20 mg by mouth daily.    [provider]  gabapentin (NEURONTIN) 100 MG capsule Take 200 mg by mouth 2 (two) times daily.    [provider]  glipiZIDE (GLUCOTROL) 10 MG tablet Take 10 mg by mouth daily before breakfast.    [provider]  levothyroxine (SYNTHROID, LEVOTHROID) 125 MCG tablet Take 125 mcg by mouth daily before breakfast.    [provider]  metFORMIN (GLUCOPHAGE) 500 MG tablet Take 1,000 mg by mouth 2 (two) times daily with a meal.     [provider]  metoprolol tartrate (LOPRESSOR)  25 MG tablet Take 0.5 tablets (12.5 mg total) by mouth 2 (two) times daily. Patient taking differently: Take 12.5 mg by mouth 2 (two) times daily. HOLD for SBP <100 or HR <60 05/20/16   Shaune Pollack, MD  nystatin (NYSTATIN) powder Apply topically 2 (two) times daily as needed. For fungal growth    [provider]  omeprazole (PRILOSEC) 20 MG capsule Take 20 mg by mouth daily.    [provider]  potassium chloride (K-DUR) 10 MEQ tablet Take 10 mEq by mouth daily.     [provider]  predniSONE (STERAPRED UNI-PAK 21 TAB) 10 MG (21) TBPK tablet Take 1 tablet (10 mg total) by mouth daily. Take 6 tablets by mouth for 1 day followed by  5 tablets by mouth for 1 day followed by  4 tablets by mouth for 1 day followed by  3 tablets by mouth for 1 day followed by  2 tablets by mouth for 1 day followed by  1 tablet by mouth for a day and stop 02/27/18   Ramonita Lab, MD  rosuvastatin (CRESTOR) 40 MG tablet Take 40 mg by mouth every evening.     [provider]  tiotropium (SPIRIVA) 18 MCG inhalation capsule Place 18 mcg into inhaler and inhale daily.    [provider]    Allergies Patient has no known allergies.  Family History  Problem Relation Age of Onset  . Heart attack Mother   . Heart attack Father   . Diabetes Sister   . Hypertension Sister   . Hypertension Brother   . Diabetes Brother   . Lung cancer Brother   . Colon cancer Brother     Social History Social History   Tobacco Use  . Smoking status: Former Games developer  . Smokeless tobacco: Never Used  Substance Use Topics  . Alcohol use: No  . Drug use: No    Review of Systems  Constitutional: No fever. Eyes: No redness. ENT: No sore throat. Cardiovascular: Denies chest pain. Respiratory: Denies acute shortness of breath. Gastrointestinal: No vomiting.  Genitourinary: Negative for dysuria.  Musculoskeletal: Negative for back pain. Skin: Negative for rash. Neurological: Negative for headache.   ____________________________________________   PHYSICAL EXAM:  VITAL SIGNS: ED Triage Vitals  Enc Vitals Group     BP 03/03/18 1150 107/88     Pulse Rate 03/03/18 1150 68     Resp 03/03/18 1150 16     Temp --      Temp src --      SpO2 03/03/18 1147 97 %     Weight 03/03/18 1153 238 lb 1.6 oz (108 kg)     Height 03/03/18 1153 5\' 4"  (1.626 m)     Head Circumference --      Peak Flow --      Pain Score 03/03/18 1152 0     Pain Loc --      Pain Edu? --        Excl. in GC? --     Constitutional: Alert and oriented.  Relatively comfortable appearing.   Eyes: Conjunctivae are normal.  Head: Atraumatic. Nose: No congestion/rhinnorhea. Mouth/Throat: Mucous membranes are dry.   Neck: Normal range of motion.  Cardiovascular: Normal rate, regular rhythm. Grossly normal heart sounds.  Good peripheral circulation. Respiratory: Normal respiratory effort.  No retractions. Lungs CTAB. Gastrointestinal: Soft and nontender. No distention.  Genitourinary: No flank tenderness. Musculoskeletal: Extremities warm and well perfused.  Neurologic:  Normal speech and language. No gross  focal neurologic deficits are appreciated.  Skin:  Skin is warm and dry. No rash noted. Psychiatric: Mood and affect are normal. Speech and behavior are normal.  ____________________________________________   LABS (all labs ordered are listed, but only abnormal results are displayed)  Labs Reviewed  BASIC METABOLIC PANEL - Abnormal; Notable for the following components:      Result Value   Chloride 91 (*)    CO2 41 (*)    Glucose, Bld 136 (*)    GFR calc non Af Amer 57 (*)    All other components within normal limits  CBC WITH DIFFERENTIAL/PLATELET - Abnormal; Notable for the following components:   Hemoglobin 11.7 (*)    RDW 14.6 (*)    Neutro Abs 6.9 (*)    All other components within normal limits  URINALYSIS, COMPLETE (UACMP) WITH MICROSCOPIC - Abnormal; Notable for the following components:   Color, Urine YELLOW (*)    APPearance CLEAR (*)    Hgb urine dipstick SMALL (*)    All other components within normal limits  TROPONIN I  BRAIN NATRIURETIC PEPTIDE   ____________________________________________  EKG  ED ECG REPORT I, Dionne Bucy, the attending physician, personally viewed and interpreted this ECG.  Date: 03/03/2018 EKG Time: 1310 Rate: 60 Rhythm: normal sinus rhythm QRS Axis: normal Intervals: RBBB ST/T Wave abnormalities:  normal Narrative Interpretation: no evidence of acute ischemia  ____________________________________________  RADIOLOGY  CXR: No focal infiltrate or other acute abnormalities  ____________________________________________   PROCEDURES  Procedure(s) performed: No  Procedures  Critical Care performed: No ____________________________________________   INITIAL IMPRESSION / ASSESSMENT AND PLAN / ED COURSE  Pertinent labs & imaging results that were available during my care of the patient were reviewed by me and considered in my medical decision making (see chart for details).  77 year old female with PMH as noted above presents with apparent confusion and tremor and concern for altered mental status, which now appears to have resolved.  The patient herself is without any complaints and states that she feels fine.  I reviewed the past medical records in Epic; the patient was just admitted earlier this month and discharged 3 days ago with COPD exacerbation requiring BiPAP as well as altered mental status.  On exam, her vital signs are normal.  She is not altered at this time.  Her O2 saturation is in the high 90s on 4 L which is slightly above her usual requirement.  She has very dry mucous membranes, but the rest of her exam is unremarkable.  Since the patient no longer appears confused, this is more consistent with transient delirium.  It is possible that the patient briefly became somewhat more hypoxic than usual.  I doubt that she is significantly hypercapnic since this would not resolve so quickly.  Differential also includes dehydration, metabolic disturbance, UTI or other infection, or less likely cardiac etiology.  We will obtain work-up for these causes and reassess.  I anticipate if the work-up is negative the patient may be discharged back to her facility.  ----------------------------------------- 1:41 PM on 03/03/2018 -----------------------------------------  The  patient's lab work-up is unremarkable.  She has elevated bicarbonate but this is chronic.  Chest x-ray shows no focal infiltrate.  UA shows WBCs, but no leukocytes, nitrite, or any other concerning findings.  The patient is asymptomatic and has no other signs of infection, so there is no evidence of UTI or indication for treatment.  The patient continues to feel comfortable and is stable for discharge back to  her facility.  Return precautions given, and she expressed understanding.  Return precautions also provided and discharge instructions for facility staff.  ____________________________________________   FINAL CLINICAL IMPRESSION(S) / ED DIAGNOSES  Final diagnoses:  Dehydration      NEW MEDICATIONS STARTED DURING THIS VISIT:  New Prescriptions   No medications on file     Note:  This document was prepared using Dragon voice recognition software and may include unintentional dictation errors.    Dionne Bucy, MD 03/03/18 1342

## 2018-03-03 NOTE — ED Triage Notes (Signed)
Pt arrived [facility] ED [facility] from [facility] for c/c of increased confusion and tremors. Transport vitals: 150/70, p54, r26, 88% at 2L via nasal cannula. Pt A&O x3 at time of transfer. No meds during transfer.

## 2018-03-03 NOTE — ED Notes (Signed)
Pt ready for discharge as per EDP. Attempted to call Ms Delena ServeWharton at [facility] at [phone] with no response. Message left informing of pending discharge.

## 2018-03-03 NOTE — ED Notes (Signed)

## 2018-03-03 NOTE — Discharge Instructions (Addendum)
Sheila Hayes was evaluated in the emergency department.  She appeared somewhat dehydrated and was given IV fluids.  She demonstrated no confusion or altered mental status while in the ED.  She had labs, chest x-ray, and urinalysis, which were all negative.  She is stable for discharge back to her facility.  She should return to the emergency department for recurrent or persistent altered mental status, weakness, shortness of breath, hypoxia, or any other new or worsening symptoms.

## 2018-03-04 NOTE — Progress Notes (Deleted)
The patient is a 77 year old female with a history of COPD.  She was last seen by our service in the hospital, she was admitted to the hospital from 02/23/2018 to 9/9 for COPD exacerbation.  She was also suspected of having obesity hypoventilation.  After discharge the patient presented to the hospital again from rehab due to acute confusion, which resolved.  Chest x-ray 03/03/2018>> cardiomegaly, obesity.  Reduced lung volumes Results for Melina ModenaWHITFIELD, Legaci F (MRN [id number]) as of 03/04/2018 14:45  Ref. Range 02/24/2018 05:08  pH, Arterial Latest Ref Range: 7.350 - 7.450  7.38  pCO2 arterial Latest Ref Range: 32.0 - 48.0 mmHg 89 (HH)  pO2, Arterial Latest Ref Range: 83.0 - 108.0 mmHg 87  Acid-Base Excess Latest Ref Range: 0.0 - 2.0 mmol/L 23.4 (H)  Bicarbonate Latest Ref Range: 20.0 - 28.0 mmol/L 52.6 (H)  O2 Saturation Latest Units: % 96.5  Patient temperature Unknown 37.0  Collection site Unknown RIGHT RADIAL  Allens test (pass/fail) Latest Ref Range: PASS  PASS

## 2018-03-07 ENCOUNTER — Inpatient Hospital Stay: Payer: Medicare Other | Admitting: Internal Medicine

## 2018-03-08 ENCOUNTER — Encounter: Payer: Self-pay | Admitting: Internal Medicine

## 2018-06-03 ENCOUNTER — Emergency Department

## 2018-06-03 ENCOUNTER — Emergency Department
Admission: EM | Admit: 2018-06-03 | Discharge: 2018-06-03 | Disposition: A | Discharge status: Skilled Nursing Facility | Attending: Emergency Medicine | Admitting: Emergency Medicine

## 2018-06-03 ENCOUNTER — Encounter: Payer: Self-pay | Admitting: Emergency Medicine

## 2018-06-03 DIAGNOSIS — E039 Hypothyroidism, unspecified: Secondary | ICD-10-CM | POA: Insufficient documentation

## 2018-06-03 DIAGNOSIS — I110 Hypertensive heart disease with heart failure: Secondary | ICD-10-CM | POA: Insufficient documentation

## 2018-06-03 DIAGNOSIS — Z79899 Other long term (current) drug therapy: Secondary | ICD-10-CM | POA: Insufficient documentation

## 2018-06-03 DIAGNOSIS — J449 Chronic obstructive pulmonary disease, unspecified: Secondary | ICD-10-CM | POA: Diagnosis not present

## 2018-06-03 DIAGNOSIS — E119 Type 2 diabetes mellitus without complications: Secondary | ICD-10-CM | POA: Insufficient documentation

## 2018-06-03 DIAGNOSIS — W01198A Fall on same level from slipping, tripping and stumbling with subsequent striking against other object, initial encounter: Secondary | ICD-10-CM | POA: Diagnosis not present

## 2018-06-03 DIAGNOSIS — Y999 Unspecified external cause status: Secondary | ICD-10-CM | POA: Insufficient documentation

## 2018-06-03 DIAGNOSIS — Z87891 Personal history of nicotine dependence: Secondary | ICD-10-CM | POA: Diagnosis not present

## 2018-06-03 DIAGNOSIS — Y9301 Activity, walking, marching and hiking: Secondary | ICD-10-CM | POA: Diagnosis not present

## 2018-06-03 DIAGNOSIS — Z7984 Long term (current) use of oral hypoglycemic drugs: Secondary | ICD-10-CM | POA: Insufficient documentation

## 2018-06-03 DIAGNOSIS — I5032 Chronic diastolic (congestive) heart failure: Secondary | ICD-10-CM | POA: Diagnosis not present

## 2018-06-03 DIAGNOSIS — Y92128 Other place in nursing home as the place of occurrence of the external cause: Secondary | ICD-10-CM | POA: Insufficient documentation

## 2018-06-03 DIAGNOSIS — W19XXXA Unspecified fall, initial encounter: Secondary | ICD-10-CM

## 2018-06-03 DIAGNOSIS — S0990XA Unspecified injury of head, initial encounter: Secondary | ICD-10-CM | POA: Diagnosis present

## 2018-06-03 LAB — GLUCOSE, CAPILLARY: Glucose-Capillary: 194 mg/dL — ABNORMAL HIGH (ref 70–99)

## 2018-06-03 MED ORDER — ACETAMINOPHEN 325 MG PO TABS
650.0000 mg | ORAL_TABLET | Freq: Once | ORAL | Status: AC
  Administered 2018-06-03: 650 mg via ORAL
  Filled 2018-06-03: qty 2

## 2018-06-03 MED ORDER — SODIUM CHLORIDE 0.9 % IV BOLUS: 500.0000 mL | Freq: Once | INTRAVENOUS | Status: DC

## 2018-06-03 NOTE — Discharge Instructions (Signed)
You were seen in the Emergency Department (ED) today for a head injury.  Based on your evaluation, you may have sustained a concussion (or bruise) to your brain.  If you had a CT scan done, it did not show any evidence of serious injury or bleeding.   ° °Symptoms to expect from a concussion include nausea, mild to moderate headache, difficulty concentrating or sleeping, and mild lightheadedness.  These symptoms should improve over the next few days to weeks, but it may take many weeks before you feel back to normal.  Return to the emergency department or follow-up with your primary care doctor if your symptoms are not improving over this time. ° °Signs of a more serious head injury include vomiting, severe headache, excessive sleepiness or confusion, and weakness or numbness in your face, arms or legs.  Return immediately to the Emergency Department if you experience any of these more concerning symptoms.   ° ° °

## 2018-06-03 NOTE — ED Notes (Signed)
Spoke to WheatleyLisa at [facility] who states they do not have transport as their transport person had a death in the family and is out today. Pt on continuous O2 at 3L, cannot be off of oxygen and does not have tank with her. Med necessity filled out for EMS transport.

## 2018-06-03 NOTE — ED Notes (Signed)

## 2018-06-03 NOTE — ED Provider Notes (Signed)
[facility] Emergency Department Provider Note   ____________________________________________   First MD Initiated Contact with Patient 06/03/18 1356     (approximate)  I have reviewed the triage vital signs and the nursing notes.   HISTORY  Chief Complaint Fall    HPI Sheila Hayes is a 77 y.o. female here for evaluation after a fall  Patient reports that she uses 3 L of oxygen at all times for her history of COPD.  She has not had any recent illness or medical concerns, but was turning today while walking and sort of got tangled up falling striking the back of her head on the floor.  She did not lose consciousness.  Had no shortness of breath or chest pain or other symptoms before or after the fall, except she reports it feels very sore and she had notable swelling over the right back of her skull.  She did not injure her neck or back.  No other injuries.  Arms and legs are okay.  She reports that she just really struck the back of her head pretty hard.  Otherwise no other concerns at this time.  Pain is described as mild, over the back of the head pointing towards her occipital region.  Past Medical History:  Diagnosis Date  . Chronic diastolic congestive heart failure (HCC)   . COPD (chronic obstructive pulmonary disease) (HCC)   . Diabetes mellitus without complication (HCC)   . Hyperlipidemia   . Hypertension   . Hypothyroidism   . Morbid obesity (HCC)   . Pulmonary hypertension [facility])     Patient Active Problem List   Diagnosis Date Noted  . Acute on chronic diastolic CHF (congestive heart failure) (HCC) 02/23/2018  . Altered mental status   . Urinary tract infection without hematuria   . Acute respiratory failure with hypoxia and hypercapnia (HCC) 01/30/2018  . Near syncope 10/09/2017  . HTN (hypertension) 10/09/2017  . Diabetes (HCC) 10/09/2017  . HLD (hyperlipidemia) 10/09/2017  . Chronic diastolic CHF (congestive heart failure)  (HCC) 10/09/2017  . Hypothyroidism 10/09/2017  . COPD exacerbation (HCC) 05/13/2016    Past Surgical History:  Procedure Laterality Date  . APPENDECTOMY    . CHOLECYSTECTOMY      Prior to Admission medications   Medication Sig Start Date End Date Taking? Authorizing Provider  acetaminophen (TYLENOL) 500 MG tablet Take 500 mg by mouth every 6 (six) hours as needed for headache.   Yes [provider]  acidophilus (RISAQUAD) CAPS capsule Take 1 capsule by mouth daily. 05/30/18 06/09/18 Yes [provider]  albuterol (PROVENTIL HFA;VENTOLIN HFA) 108 (90 Base) MCG/ACT inhaler Inhale 2 puffs into the lungs every 6 (six) hours as needed for wheezing or shortness of breath.   Yes [provider]  budesonide-formoterol (SYMBICORT) 80-4.5 MCG/ACT inhaler Inhale 2 puffs into the lungs 2 (two) times daily.   Yes [provider]  calcium carbonate (OSCAL) 1500 (600 Ca) MG TABS tablet Take 600 mg of elemental calcium by mouth 2 (two) times daily with a meal.    Yes [provider]  citalopram (CELEXA) 40 MG tablet Take 40 mg by mouth daily.   Yes [provider]  docusate sodium (COLACE) 100 MG capsule Take 200 mg by mouth at bedtime.   Yes [provider]  furosemide (LASIX) 20 MG tablet Take 20 mg by mouth daily.   Yes [provider]  gabapentin (NEURONTIN) 100 MG capsule Take 200 mg by mouth 2 (two)  times daily.   Yes [provider]  glipiZIDE (GLUCOTROL) 10 MG tablet Take 10 mg by mouth daily before breakfast.   Yes [provider]  levothyroxine (SYNTHROID, LEVOTHROID) 125 MCG tablet Take 125 mcg by mouth daily before breakfast.   Yes [provider]  LORazepam (ATIVAN) 0.5 MG tablet Take 0.5 mg by mouth every 4 (four) hours as needed for anxiety.   Yes [provider]  metFORMIN (GLUCOPHAGE) 500 MG tablet Take 1,000 mg by mouth 2 (two) times daily with a meal.    Yes [provider]    metoprolol tartrate (LOPRESSOR) 25 MG tablet Take 0.5 tablets (12.5 mg total) by mouth 2 (two) times daily. Patient taking differently: Take 12.5 mg by mouth 2 (two) times daily. HOLD for SBP <100 or HR <60 05/20/16  Yes Shaune Pollack, MD  morphine (ROXANOL) 20 MG/ML concentrated solution Take 10 mg by mouth every 2 (two) hours as needed for severe pain.   Yes [provider]  nystatin (NYSTATIN) powder Apply topically 2 (two) times daily as needed. For fungal growth   Yes [provider]  omeprazole (PRILOSEC) 20 MG capsule Take 20 mg by mouth daily.   Yes [provider]  potassium chloride (K-DUR) 10 MEQ tablet Take 10 mEq by mouth daily.   Yes [provider]  prochlorperazine (COMPAZINE) 10 MG tablet Take 10 mg by mouth every 6 (six) hours as needed for nausea or vomiting.   Yes [provider]  rosuvastatin (CRESTOR) 40 MG tablet Take 40 mg by mouth every evening.    Yes [provider]  sulfamethoxazole-trimethoprim (BACTRIM DS,SEPTRA DS) 800-160 MG tablet Take 1 tablet by mouth 2 (two) times daily. 05/30/18 06/06/18 Yes [provider]  tiotropium (SPIRIVA) 18 MCG inhalation capsule Place 18 mcg into inhaler and inhale daily.   Yes [provider]  predniSONE (STERAPRED UNI-PAK 21 TAB) 10 MG (21) TBPK tablet Take 1 tablet (10 mg total) by mouth daily. Take 6 tablets by mouth for 1 day followed by  5 tablets by mouth for 1 day followed by  4 tablets by mouth for 1 day followed by  3 tablets by mouth for 1 day followed by  2 tablets by mouth for 1 day followed by  1 tablet by mouth for a day and stop Patient not taking: Reported on 06/03/2018 02/27/18   Ramonita Lab, MD    Allergies Patient has no known allergies.  Family History  Problem Relation Age of Onset  . Heart attack Mother   . Heart attack Father   . Diabetes Sister   . Hypertension Sister   . Hypertension Brother   . Diabetes Brother   . Lung cancer  Brother   . Colon cancer Brother     Social History Social History   Tobacco Use  . Smoking status: Former Games developer  . Smokeless tobacco: Never Used  Substance Use Topics  . Alcohol use: No  . Drug use: No    Review of Systems Constitutional: No fever/chills or recent illness Eyes: No visual changes. ENT: No sore throat.  No neck pain. Cardiovascular: Denies chest pain. Respiratory: Denies shortness of breath.  No trouble breathing right now, always uses 3 L of oxygen. Gastrointestinal: No abdominal pain.   Genitourinary: Negative for dysuria. Musculoskeletal: Negative for back pain.  No pain in the arms or legs.  No hip pain or discomfort. Skin: Negative for rash. Neurological: Negative for  areas of focal weakness or  numbness.    ____________________________________________   PHYSICAL EXAM:  VITAL SIGNS: ED Triage Vitals [06/03/18 1352]  Enc Vitals Group     BP (!) 120/51     Pulse Rate 74     Resp 16     Temp 98.3 F (36.8 C)     Temp Source Oral     SpO2 92 %     Weight      Height      Head Circumference      Peak Flow      Pain Score      Pain Loc      Pain Edu?      Excl. in GC?     Constitutional: Alert and oriented. Well appearing and in no acute distress.  She is very pleasant. Eyes: Conjunctivae are normal. Head: Atraumatic except for a notable hematoma about the size of small fist over the right posterior occipital region. Nose: No congestion/rhinnorhea. Mouth/Throat: Mucous membranes are moist.  No neck discomfort with range of motion.  No midline cervical tenderness. Neck: No stridor.  Cardiovascular: Normal rate, regular rhythm. Grossly normal heart sounds.  Good peripheral circulation. Respiratory: Normal respiratory effort.  No retractions. Lungs CTAB. Gastrointestinal: Soft and nontender. No distention. Musculoskeletal: No lower extremity tenderness nor edema.  Moves all extremities well with equal strength.  No pain with axial loading of  the lower extremities bilateral.  Ranges arms and legs well without any discomfort or pain.  5-5 strength all extremities. Neurologic:  Normal speech and language. No gross focal neurologic deficits are appreciated.  Skin:  Skin is warm, dry and intact. No rash noted. Psychiatric: Mood and affect are normal. Speech and behavior are normal.  ____________________________________________   LABS (all labs ordered are listed, but only abnormal results are displayed)  Labs Reviewed  GLUCOSE, CAPILLARY - Abnormal; Notable for the following components:      Result Value   Glucose-Capillary 194 (*)    All other components within normal limits  CBG MONITORING, ED   ____________________________________________  EKG  Reviewed entered by me at 1500 Heart rate 65 QRS 149 QTC 489 normal sinus rhythm, right bundle branch block without evidence of acute ischemia. ____________________________________________  RADIOLOGY  Ct Head Wo Contrast  Result Date: 06/03/2018 CLINICAL DATA:  Head trauma-unwitnessed fall EXAM: CT HEAD WITHOUT CONTRAST TECHNIQUE: Contiguous axial images were obtained from the base of the skull through the vertex without intravenous contrast. COMPARISON:  03/05/2018 FINDINGS: Brain: No evidence of acute infarction, hemorrhage, hydrocephalus, extra-axial collection or mass lesion/mass effect. Vascular: No hyperdense vessel or unexpected calcification. Skull: Posterior right occipital parietal scalp contusion without underlying calvarial fracture. No opaque foreign body. Sinuses/Orbits: Bilateral cataract resection. IMPRESSION: 1. No acute finding. 2. Right posterior scalp contusion without calvarial fracture. Electronically Signed   By: Marnee SpringJonathon  Watts M.D.   On: 06/03/2018 14:34     CT of the head reviewed, no acute findings except for posterior scalp contusion without fracture. ____________________________________________   PROCEDURES  Procedure(s) performed:  None  Procedures  Critical Care performed: No  ____________________________________________   INITIAL IMPRESSION / ASSESSMENT AND PLAN / ED COURSE  Pertinent labs & imaging results that were available during my care of the patient were reviewed by me and considered in my medical decision making (see chart for details).   Fall, appears mechanical in nature.  Denies any associated neurologic cardiac vascular or respiratory symptoms.  Reassuring examination except he does have a notable hematoma over the right posterior scalp,  will obtain a CT of the head to evaluate for evidence of intracranial injury.  Her mental status is normal.  Did not lose consciousness.  No syncopal or presyncopal type symptoms.    ----------------------------------------- 4:00 PM on 06/03/2018 -----------------------------------------  Patient is resting comfortably, no distress fully alert.  Reviewed her CT findings, reports she has no concerns at this time of the back of her scalp is still just slightly sore but not any notable pain.  She is comfortable the plan for discharge.  Return precautions and treatment recommendations and follow-up discussed with the patient who is agreeable with the plan.  Vitals:   06/03/18 1352  BP: (!) 120/51  Pulse: 74  Resp: 16  Temp: 98.3 F (36.8 C)  SpO2: 92%     ____________________________________________   FINAL CLINICAL IMPRESSION(S) / ED DIAGNOSES  Final diagnoses:  Closed head injury, initial encounter  Fall, initial encounter        Note:  This document was prepared using Conservation officer, historic buildings and may include unintentional dictation errors       Sharyn Creamer, MD 06/03/18 [phone]

## 2018-06-03 NOTE — ED Triage Notes (Signed)
[facility] post unwitnessed fall.  Patient is alert and oriented to self, place and situation at this time.  Patient has hematoma to patient's head and reports hitting her head.  Patient does not take blood thinners.  Patient vomited x 1 post fall.  Patient denies any pain other than her head.

## 2018-06-14 ENCOUNTER — Other Ambulatory Visit: Payer: Self-pay

## 2018-06-14 ENCOUNTER — Inpatient Hospital Stay (HOSPITAL_COMMUNITY)
Admission: AD | Admit: 2018-06-14 | Discharge: 2018-06-18 | DRG: 189 | Disposition: A | Discharge status: Skilled Nursing Facility | Source: Other Acute Inpatient Hospital | Attending: Family Medicine | Admitting: Family Medicine

## 2018-06-14 ENCOUNTER — Emergency Department
Admission: EM | Admit: 2018-06-14 | Discharge: 2018-06-14 | Disposition: A | Discharge status: Other Acute Inpatient Hospital | Attending: Emergency Medicine | Admitting: Emergency Medicine

## 2018-06-14 ENCOUNTER — Emergency Department

## 2018-06-14 DIAGNOSIS — Z8249 Family history of ischemic heart disease and other diseases of the circulatory system: Secondary | ICD-10-CM

## 2018-06-14 DIAGNOSIS — Z9049 Acquired absence of other specified parts of digestive tract: Secondary | ICD-10-CM | POA: Diagnosis not present

## 2018-06-14 DIAGNOSIS — R0602 Shortness of breath: Secondary | ICD-10-CM | POA: Diagnosis present

## 2018-06-14 DIAGNOSIS — E785 Hyperlipidemia, unspecified: Secondary | ICD-10-CM | POA: Diagnosis present

## 2018-06-14 DIAGNOSIS — F419 Anxiety disorder, unspecified: Secondary | ICD-10-CM | POA: Diagnosis not present

## 2018-06-14 DIAGNOSIS — Z79890 Hormone replacement therapy (postmenopausal): Secondary | ICD-10-CM

## 2018-06-14 DIAGNOSIS — J9622 Acute and chronic respiratory failure with hypercapnia: Secondary | ICD-10-CM | POA: Diagnosis present

## 2018-06-14 DIAGNOSIS — E119 Type 2 diabetes mellitus without complications: Secondary | ICD-10-CM | POA: Diagnosis present

## 2018-06-14 DIAGNOSIS — I5032 Chronic diastolic (congestive) heart failure: Secondary | ICD-10-CM | POA: Diagnosis not present

## 2018-06-14 DIAGNOSIS — B369 Superficial mycosis, unspecified: Secondary | ICD-10-CM | POA: Diagnosis not present

## 2018-06-14 DIAGNOSIS — Z7984 Long term (current) use of oral hypoglycemic drugs: Secondary | ICD-10-CM | POA: Insufficient documentation

## 2018-06-14 DIAGNOSIS — J9602 Acute respiratory failure with hypercapnia: Secondary | ICD-10-CM | POA: Diagnosis not present

## 2018-06-14 DIAGNOSIS — J9621 Acute and chronic respiratory failure with hypoxia: Secondary | ICD-10-CM | POA: Diagnosis present

## 2018-06-14 DIAGNOSIS — Z79899 Other long term (current) drug therapy: Secondary | ICD-10-CM

## 2018-06-14 DIAGNOSIS — Z7952 Long term (current) use of systemic steroids: Secondary | ICD-10-CM

## 2018-06-14 DIAGNOSIS — I110 Hypertensive heart disease with heart failure: Secondary | ICD-10-CM | POA: Diagnosis present

## 2018-06-14 DIAGNOSIS — Z87891 Personal history of nicotine dependence: Secondary | ICD-10-CM | POA: Diagnosis not present

## 2018-06-14 DIAGNOSIS — Z833 Family history of diabetes mellitus: Secondary | ICD-10-CM | POA: Diagnosis not present

## 2018-06-14 DIAGNOSIS — E039 Hypothyroidism, unspecified: Secondary | ICD-10-CM | POA: Diagnosis not present

## 2018-06-14 DIAGNOSIS — Z801 Family history of malignant neoplasm of trachea, bronchus and lung: Secondary | ICD-10-CM

## 2018-06-14 DIAGNOSIS — J441 Chronic obstructive pulmonary disease with (acute) exacerbation: Secondary | ICD-10-CM | POA: Diagnosis present

## 2018-06-14 DIAGNOSIS — Z7951 Long term (current) use of inhaled steroids: Secondary | ICD-10-CM | POA: Diagnosis not present

## 2018-06-14 DIAGNOSIS — G931 Anoxic brain damage, not elsewhere classified: Secondary | ICD-10-CM | POA: Diagnosis not present

## 2018-06-14 DIAGNOSIS — Z800 Family history of malignant neoplasm of digestive organs: Secondary | ICD-10-CM | POA: Diagnosis not present

## 2018-06-14 DIAGNOSIS — E1169 Type 2 diabetes mellitus with other specified complication: Secondary | ICD-10-CM | POA: Diagnosis not present

## 2018-06-14 DIAGNOSIS — I272 Pulmonary hypertension, unspecified: Secondary | ICD-10-CM | POA: Diagnosis present

## 2018-06-14 DIAGNOSIS — Z9981 Dependence on supplemental oxygen: Secondary | ICD-10-CM

## 2018-06-14 DIAGNOSIS — F329 Major depressive disorder, single episode, unspecified: Secondary | ICD-10-CM | POA: Diagnosis present

## 2018-06-14 LAB — URINALYSIS, ROUTINE W REFLEX MICROSCOPIC
Bilirubin Urine: NEGATIVE
Glucose, UA: NEGATIVE mg/dL
Ketones, ur: NEGATIVE mg/dL
Leukocytes, UA: NEGATIVE
Nitrite: NEGATIVE
PROTEIN: 100 mg/dL — AB
Specific Gravity, Urine: 1.013 (ref 1.005–1.030)
pH: 5 (ref 5.0–8.0)

## 2018-06-14 LAB — COMPREHENSIVE METABOLIC PANEL
ALK PHOS: 48 U/L (ref 38–126)
ALT: 8 U/L (ref 0–44)
AST: 13 U/L — ABNORMAL LOW (ref 15–41)
Albumin: 3.4 g/dL — ABNORMAL LOW (ref 3.5–5.0)
Anion gap: 9 (ref 5–15)
BILIRUBIN TOTAL: 0.4 mg/dL (ref 0.3–1.2)
BUN: 12 mg/dL (ref 8–23)
CALCIUM: 9 mg/dL (ref 8.9–10.3)
CO2: 42 mmol/L — ABNORMAL HIGH (ref 22–32)
Chloride: 90 mmol/L — ABNORMAL LOW (ref 98–111)
Creatinine, Ser: 0.82 mg/dL (ref 0.44–1.00)
GFR calc Af Amer: >60 mL/min (ref >60)
GFR calc non Af Amer: >60 mL/min (ref >60)
Glucose, Bld: 110 mg/dL — ABNORMAL HIGH (ref 70–99)
Potassium: 3.6 mmol/L (ref 3.5–5.1)
Sodium: 141 mmol/L (ref 135–145)
TOTAL PROTEIN: 6.9 g/dL (ref 6.5–8.1)

## 2018-06-14 LAB — CBC
HCT: 35.5 % — ABNORMAL LOW (ref 36.0–46.0)
HEMOGLOBIN: 10.1 g/dL — AB (ref 12.0–15.0)
MCH: 27.2 pg (ref 26.0–34.0)
MCHC: 28.5 g/dL — AB (ref 30.0–36.0)
MCV: 95.4 fL (ref 80.0–100.0)
Platelets: 180 10*3/uL (ref 150–400)
RBC: 3.72 MIL/uL — ABNORMAL LOW (ref 3.87–5.11)
RDW: 13.5 % (ref 11.5–15.5)
WBC: 10.3 10*3/uL (ref 4.0–10.5)
nRBC: 0 % (ref 0.0–0.2)

## 2018-06-14 LAB — CBC WITH DIFFERENTIAL/PLATELET
Abs Immature Granulocytes: 0.04 10*3/uL (ref 0.00–0.07)
Basophils Absolute: 0 10*3/uL (ref 0.0–0.1)
Basophils Relative: 0 %
EOS PCT: 0 %
Eosinophils Absolute: 0 10*3/uL (ref 0.0–0.5)
HCT: 38.2 % (ref 36.0–46.0)
Hemoglobin: 10.8 g/dL — ABNORMAL LOW (ref 12.0–15.0)
Immature Granulocytes: 0 %
Lymphocytes Relative: 15 %
Lymphs Abs: 1.5 10*3/uL (ref 0.7–4.0)
MCH: 26.8 pg (ref 26.0–34.0)
MCHC: 28.3 g/dL — ABNORMAL LOW (ref 30.0–36.0)
MCV: 94.8 fL (ref 80.0–100.0)
Monocytes Absolute: 0.7 10*3/uL (ref 0.1–1.0)
Monocytes Relative: 7 %
Neutro Abs: 7.4 10*3/uL (ref 1.7–7.7)
Neutrophils Relative %: 78 %
Platelets: 188 10*3/uL (ref 150–400)
RBC: 4.03 MIL/uL (ref 3.87–5.11)
RDW: 13.5 % (ref 11.5–15.5)
WBC: 9.6 10*3/uL (ref 4.0–10.5)
nRBC: 0 % (ref 0.0–0.2)

## 2018-06-14 LAB — BLOOD GAS, ARTERIAL
Acid-Base Excess: 16.1 mmol/L — ABNORMAL HIGH (ref 0.0–2.0)
Bicarbonate: 46.9 mmol/L — ABNORMAL HIGH (ref 20.0–28.0)
Delivery systems: POSITIVE
FIO2: 0.4
O2 Saturation: 92 %
Patient temperature: 37
pCO2 arterial: 91 mmHg (ref 32.0–48.0)
pH, Arterial: 7.32 — ABNORMAL LOW (ref 7.350–7.450)
pO2, Arterial: 69 mmHg — ABNORMAL LOW (ref 83.0–108.0)

## 2018-06-14 LAB — PROCALCITONIN: Procalcitonin: <0.1 ng/mL

## 2018-06-14 LAB — CREATININE, SERUM
CREATININE: 0.85 mg/dL (ref 0.44–1.00)
GFR calc Af Amer: >60 mL/min (ref >60)
GFR calc non Af Amer: >60 mL/min (ref >60)

## 2018-06-14 LAB — GLUCOSE, CAPILLARY
GLUCOSE-CAPILLARY: 147 mg/dL — AB (ref 70–99)
GLUCOSE-CAPILLARY: 201 mg/dL — AB (ref 70–99)

## 2018-06-14 LAB — BRAIN NATRIURETIC PEPTIDE: B Natriuretic Peptide: 90 pg/mL (ref 0.0–100.0)

## 2018-06-14 LAB — INFLUENZA PANEL BY PCR (TYPE A & B)
Influenza A By PCR: NEGATIVE
Influenza B By PCR: NEGATIVE

## 2018-06-14 LAB — CG4 I-STAT (LACTIC ACID): Lactic Acid, Venous: 1.72 mmol/L (ref 0.5–1.9)

## 2018-06-14 LAB — TROPONIN I: Troponin I: <0.03 ng/mL (ref <0.03)

## 2018-06-14 LAB — D-DIMER, QUANTITATIVE: D-Dimer, Quant: 0.66 ug/mL-FEU — ABNORMAL HIGH (ref 0.00–0.50)

## 2018-06-14 MED ORDER — IPRATROPIUM-ALBUTEROL 0.5-2.5 (3) MG/3ML IN SOLN
RESPIRATORY_TRACT | Status: AC
  Administered 2018-06-14: 3 mL via RESPIRATORY_TRACT
  Filled 2018-06-14: qty 3

## 2018-06-14 MED ORDER — IPRATROPIUM-ALBUTEROL 0.5-2.5 (3) MG/3ML IN SOLN
3.0000 mL | Freq: Four times a day (QID) | RESPIRATORY_TRACT | Status: DC
  Administered 2018-06-14 – 2018-06-17 (×13): 3 mL via RESPIRATORY_TRACT
  Filled 2018-06-14 (×10): qty 3

## 2018-06-14 MED ORDER — SODIUM CHLORIDE 0.9 % IV SOLN
500.0000 mg | Freq: Once | INTRAVENOUS | Status: AC
  Administered 2018-06-14: 500 mg via INTRAVENOUS
  Filled 2018-06-14: qty 500

## 2018-06-14 MED ORDER — LEVOTHYROXINE SODIUM 25 MCG PO TABS
125.0000 ug | ORAL_TABLET | Freq: Every day | ORAL | Status: DC
  Administered 2018-06-15 – 2018-06-18 (×4): 125 ug via ORAL
  Filled 2018-06-14 (×4): qty 1

## 2018-06-14 MED ORDER — ORAL CARE MOUTH RINSE
15.0000 mL | Freq: Two times a day (BID) | OROMUCOSAL | Status: DC
  Administered 2018-06-15 – 2018-06-16 (×4): 15 mL via OROMUCOSAL

## 2018-06-14 MED ORDER — HEPARIN SODIUM (PORCINE) 5000 UNIT/ML IJ SOLN
5000.0000 [IU] | Freq: Three times a day (TID) | INTRAMUSCULAR | Status: DC
  Administered 2018-06-14 – 2018-06-18 (×12): 5000 [IU] via SUBCUTANEOUS
  Filled 2018-06-14 (×12): qty 1

## 2018-06-14 MED ORDER — METHYLPREDNISOLONE SODIUM SUCC 40 MG IJ SOLR
40.0000 mg | Freq: Four times a day (QID) | INTRAMUSCULAR | Status: DC
  Administered 2018-06-14 – 2018-06-16 (×8): 40 mg via INTRAVENOUS
  Filled 2018-06-14 (×8): qty 1

## 2018-06-14 MED ORDER — METHYLPREDNISOLONE SODIUM SUCC 125 MG IJ SOLR
125.0000 mg | Freq: Once | INTRAMUSCULAR | Status: AC
  Administered 2018-06-14: 125 mg via INTRAVENOUS
  Filled 2018-06-14: qty 2

## 2018-06-14 MED ORDER — CHLORHEXIDINE GLUCONATE 0.12 % MT SOLN
15.0000 mL | Freq: Two times a day (BID) | OROMUCOSAL | Status: DC
  Administered 2018-06-15 – 2018-06-16 (×4): 15 mL via OROMUCOSAL
  Filled 2018-06-14 (×4): qty 15

## 2018-06-14 MED ORDER — SODIUM CHLORIDE 0.9 % IV BOLUS
500.0000 mL | Freq: Once | INTRAVENOUS | Status: AC
  Administered 2018-06-14: 500 mL via INTRAVENOUS

## 2018-06-14 MED ORDER — IPRATROPIUM-ALBUTEROL 0.5-2.5 (3) MG/3ML IN SOLN
9.0000 mL | Freq: Once | RESPIRATORY_TRACT | Status: AC
  Administered 2018-06-14: 9 mL via RESPIRATORY_TRACT
  Filled 2018-06-14: qty 9

## 2018-06-14 MED ORDER — ALBUMIN HUMAN 25 % IV SOLN
12.5000 g | Freq: Once | INTRAVENOUS | Status: AC
  Administered 2018-06-14: 12.5 g via INTRAVENOUS
  Filled 2018-06-14: qty 50

## 2018-06-14 MED ORDER — PANTOPRAZOLE SODIUM 40 MG IV SOLR
40.0000 mg | INTRAVENOUS | Status: DC
  Administered 2018-06-14 – 2018-06-15 (×2): 40 mg via INTRAVENOUS
  Filled 2018-06-14 (×2): qty 40

## 2018-06-14 MED ORDER — NYSTATIN 100000 UNIT/GM EX POWD
Freq: Three times a day (TID) | CUTANEOUS | Status: DC | PRN
  Administered 2018-06-16: 09:00:00 via TOPICAL
  Filled 2018-06-14: qty 15

## 2018-06-14 MED ORDER — BUDESONIDE 0.5 MG/2ML IN SUSP
0.5000 mg | Freq: Two times a day (BID) | RESPIRATORY_TRACT | Status: DC
  Administered 2018-06-15 – 2018-06-18 (×7): 0.5 mg via RESPIRATORY_TRACT
  Filled 2018-06-14 (×7): qty 2

## 2018-06-14 MED ORDER — INSULIN ASPART 100 UNIT/ML ~~LOC~~ SOLN
0.0000 [IU] | SUBCUTANEOUS | Status: DC
  Administered 2018-06-14: 5 [IU] via SUBCUTANEOUS
  Administered 2018-06-15 (×2): 8 [IU] via SUBCUTANEOUS
  Administered 2018-06-15 (×3): 5 [IU] via SUBCUTANEOUS
  Administered 2018-06-15 – 2018-06-16 (×3): 3 [IU] via SUBCUTANEOUS
  Administered 2018-06-16: 2 [IU] via SUBCUTANEOUS

## 2018-06-14 MED ORDER — SODIUM CHLORIDE 0.9 % IV SOLN
500.0000 mg | INTRAVENOUS | Status: DC
  Administered 2018-06-15: 500 mg via INTRAVENOUS
  Filled 2018-06-14 (×3): qty 500

## 2018-06-14 NOTE — Progress Notes (Signed)
RT NOTE: RT received patient via [facility] from [place]. Per [facility] current bipap settings are 12/6, 40%. RT will continue to monitor.

## 2018-06-14 NOTE — ED Notes (Signed)
Dr. Pershing ProudSchaevitz made aware of need for fluids d/t pt being a code sepsis- Dr. Pershing ProudSchaevitz denied to do 3100 mL bolus d/t pressure BP being stable and pt having history of CHF. Order for 500 mL bolus

## 2018-06-14 NOTE — ED Triage Notes (Signed)
Pt to ED from [facility] via EMS. Pt vomited x1 time this morning, had syncopal episode, and is having shortness of breath. Living facility reports possible uti. Pt bumped from 2L to 4L enroute to facilty. Pt does not appear to be in distress at this time

## 2018-06-14 NOTE — Progress Notes (Signed)
CODE SEPSIS - PHARMACY COMMUNICATION  **Broad Spectrum Antibiotics should be administered within 1 hour of Sepsis diagnosis**  Time Code Sepsis Called/Page Received: 1539  Antibiotics Ordered: azithromycin  Time of 1st antibiotic administration: 1616  Additional action taken by pharmacy: none required  If necessary, Name of Provider/Nurse Contacted: N/A    Lowella Bandyodney D [place] ,PharmD Clinical Pharmacist  06/14/2018  4:18 PM

## 2018-06-14 NOTE — ED Notes (Signed)
Pt placed on Bipap at this time. Resp in room at this time drawing an ABG.

## 2018-06-14 NOTE — H&P (Addendum)
NAME:  Sheila Hayes, MRN:  [id number], DOB:  1940/10/10, LOS: 0 ADMISSION DATE:  06/14/2018, CONSULTATION DATE:  06/14/2018 REFERRING MD: Dr. Pershing Proud , CHIEF COMPLAINT:  SOB/ Hypoxia  Brief History   77 year old female SNF resident with COPD with episode of vomiting and diarrhea and possible syncopal episode at [facility].  Ongoing hypoxia and increased work of breathing requiring BiPAP.   History of present illness   HPI obtained from medical chart review as patient is encephalopathic and from talking to patient's niece, [name] who is her HPOA.  77 year old female with history significant for COPD, chronic hypoxic respiratory failure on 3L O2 baseline, HTN, DMT2, and hypothyroidism who presented from [facility] assisted living with shortness of breath and hypoxia.    Her niece reports there has been a stomach bug going around patient's SNF.  She was told patient had episode of nausea and vomiting and possible reported syncopal episode.  After that, patient was hypoxic in the low 80's requiring more oxygen.  She was taken to [facility] ER.  There she was afebrile and hemodynamically stable.  CXR did not show obvious inflirate.  Labs noted for flu negative, WBC 9.6, Hgb 10.8, K 3.6, lactic 1.72, BNP 90 and negative troponin.  ABG showed 7.32/ 91 /69 /46.9.  Due to poor work of breathing, hypercarbia, and hypoxia, patient placed on BiPAP.  Of note, patient is a no CPR/ defib however family still requesting intubation.  Due to no ICU beds at [facility], patient was transferred to [facility], PCCM accepting.    Past Medical History  HTN, DMII, Hypothryoidism, CHF NYHA Class 2, pulmonary hypertension,and COPD Significant Hospital Events   12/24 Admit  Consults:   Procedures:   Significant Diagnostic Tests:   Micro Data:  12/24 MRSA PCR   Antimicrobials:  12/24 Azithro  Interim history/subjective:   Objective   Blood pressure 117/65, pulse (!) 108, resp. rate (!) 28, SpO2 95 %.       No intake or  output data in the 24 hours ending 06/14/18 1934 There were no vitals filed for this visit.  Examination: General:  Older WF sitting up right in bed in NAD HEENT: pupils 3/ reactive anicteric, thick neck, no obvious JVD, well fitting full face bipap mask Neuro: lethargic but awakens to voice and follows commands, MAE, confused oriented to name / place CV: RR, no murmur PULM: even/non-labored on Bipap  12/6, 40%, lungs bilaterally coarse and diminished, no wheeze GI: obese, soft, non-tender, bs active  Extremities: warm/dry, +1 tibial edema  Skin: rash under bilateral breast folds, several plaque patches scattered,   Resolved Hospital Problem list    Assessment & Plan:  Acute on chronic hypoxic respiratory failure in the setting of end stage COPD +/- AECOPD on baseline home O2 3L - vomiting episode precipitated worsening hypoxia and work breathing however no obvious infiltrate on CXR - flu negative, no WBC, and afebrile - also some question of possible syncopal episode concerning, may have been related to hypoxia, or given hx of pulmonary hypertension (although her last 2 TTE do not mention PASP pressures), concern for possible PE- although trop/ BNP neg less likely to have significant PE with right heart strain P:  Continue BiPAP for now- minimal settings 12/6 and monitor mental status If worsening mental status will repeat ABG Wean FiO2 for goal sat 88-93% Duonebs q 6 and pulmicort BID Solumedrol 40mg  q 6  Continue azithromax Check PCT if neg, consider d/c abx Check D-dimer,  and consider CTA PE  NPO till more awake Hold morphine (unclear why she is on this and not a full DNR)  Had long phone discussion with patient niece, [name], as she is her HInetta FermoOA.  Stated patient would not want CPR or defib but still requesting intubation.  We discussed that given her underlying chronic health, wheelchair bound, and end stage irreversible lung disease, that we would not recommend intubation as she  may never come off of it.  Inetta Fermoina states she frequently comes with requiring BiPAP and last year required intubation.  I explained to her that this is part of end stage disease and that her chronic hypercarbia/ hypoxia from it will continue.  I recommended she consider DNI and if the time comes that non-invasive care fails, then we should consider transition to full comfort care.   Acute encephalopathy from above P:  Continue bipap, and monitor Check UA as well  Reported vomiting/ diarrhea - none reported since being in ER or here P:  Monitor empiric precautions for now If patient develops vomiting, she will no longer be a candidate for BiPAP  DMT2 P:  CBG q4 SSI   HTN, heart failure (TTE 09/2017 EF 55-60%, mod TR), ?pulmonary hypertension, HLD P:  Hold lopressor, lasix Resume crestor when able to take POs/ more awake  Hypothyroidism P:  Levothyroxine 125 mcg daily- if not more alert by am, will need to switch to IV form  Anxiety/ depression P:  Hold ativan and celexa for now   Fungal rash in breast folds P;  Nystatin as needed   Best practice:  Diet: NPO until mental status is improved Pain/Anxiety/Delirium protocol (if indicated): n/a VAP protocol (if indicated): n/a DVT prophylaxis: heparin SQ GI prophylaxis: protonix Glucose control: SSI Mobility: BR Code Status: Limited, no CPR/ defib but yes to everything else Family Communication: see above, spoke with Inetta Fermoina, patient's niece and HPOA Disposition: ICU for now  Labs   CBC: Recent Labs  Lab 06/14/18 1542  WBC 9.6  NEUTROABS 7.4  HGB 10.8*  HCT 38.2  MCV 94.8  PLT 188    Basic Metabolic Panel: Recent Labs  Lab 06/14/18 1542  NA 141  K 3.6  CL 90*  CO2 42*  GLUCOSE 110*  BUN 12  CREATININE 0.82  CALCIUM 9.0   GFR: Estimated Creatinine Clearance: 75 mL/min (by C-G formula based on SCr of 0.82 mg/dL). Recent Labs  Lab 06/14/18 1542 06/14/18 1547  WBC 9.6  --   LATICACIDVEN  --  1.72     Liver Function Tests: Recent Labs  Lab 06/14/18 1542  AST 13*  ALT 8  ALKPHOS 48  BILITOT 0.4  PROT 6.9  ALBUMIN 3.4*   No results for input(s): LIPASE, AMYLASE in the last 168 hours. No results for input(s): AMMONIA in the last 168 hours.  ABG    Component Value Date/Time   PHART 7.32 (L) 06/14/2018 1559   PCO2ART 91 (HH) 06/14/2018 1559   PO2ART 69 (L) 06/14/2018 1559   HCO3 46.9 (H) 06/14/2018 1559   ACIDBASEDEF 2.1 (H) 05/13/2016 1407   O2SAT 92.0 06/14/2018 1559     Coagulation Profile: No results for input(s): INR, PROTIME in the last 168 hours.  Cardiac Enzymes: Recent Labs  Lab 06/14/18 1542  TROPONINI <0.03    HbA1C: Hemoglobin A1C  Date/Time Value Ref Range Status  11/03/2011 04:31 PM 6.5 (H) 4.2 - 6.3 % Final    Comment:    The American Diabetes Association recommends that  a primary goal of therapy should be <7% and that physicians should reevaluate the treatment regimen in patients with HbA1c values consistently >8%.    Hgb A1c MFr Bld  Date/Time Value Ref Range Status  01/31/2018 05:17 AM 5.3 4.8 - 5.6 % Final    Comment:    (NOTE)         Prediabetes: 5.7 - 6.4         Diabetes: >6.4         Glycemic control for adults with diabetes: <7.0     CBG: Recent Labs  Lab 06/14/18 1854  GLUCAP 147*    Review of Systems:   Unable  Past Medical History  She,  has a past medical history of Chronic diastolic congestive heart failure (HCC), COPD (chronic obstructive pulmonary disease) (HCC), Diabetes mellitus without complication (HCC), Hyperlipidemia, Hypertension, Hypothyroidism, Morbid obesity (HCC), and Pulmonary hypertension (HCC).   Surgical History    Past Surgical History:  Procedure Laterality Date  . APPENDECTOMY    . CHOLECYSTECTOMY       Social History   reports that she has quit smoking. She has never used smokeless tobacco. She reports that she does not drink alcohol or use drugs.   Family History   Her family  history includes Colon cancer in her brother; Diabetes in her brother and sister; Heart attack in her father and mother; Hypertension in her brother and sister; Lung cancer in her brother.   Allergies No Known Allergies   Home Medications  Prior to Admission medications   Medication Sig Start Date End Date Taking? Authorizing Provider  acetaminophen (TYLENOL) 500 MG tablet Take 500 mg by mouth every 6 (six) hours as needed for headache.    [provider]  albuterol (PROVENTIL HFA;VENTOLIN HFA) 108 (90 Base) MCG/ACT inhaler Inhale 2 puffs into the lungs every 6 (six) hours as needed for wheezing or shortness of breath.    [provider]  budesonide-formoterol (SYMBICORT) 80-4.5 MCG/ACT inhaler Inhale 2 puffs into the lungs 2 (two) times daily.    [provider]  calcium carbonate (OSCAL) 1500 (600 Ca) MG TABS tablet Take 600 mg of elemental calcium by mouth 2 (two) times daily with a meal.     [provider]  citalopram (CELEXA) 40 MG tablet Take 40 mg by mouth daily.    [provider]  docusate sodium (COLACE) 100 MG capsule Take 200 mg by mouth at bedtime.    [provider]  furosemide (LASIX) 20 MG tablet Take 20 mg by mouth daily.    [provider]  gabapentin (NEURONTIN) 100 MG capsule Take 200 mg by mouth 2 (two) times daily.    [provider]  glipiZIDE (GLUCOTROL) 10 MG tablet Take 10 mg by mouth daily before breakfast.    [provider]  levothyroxine (SYNTHROID, LEVOTHROID) 125 MCG tablet Take 125 mcg by mouth daily before breakfast.    [provider]  LORazepam (ATIVAN) 0.5 MG tablet Take 0.5 mg by mouth every 4 (four) hours as needed for anxiety.    [provider]  metFORMIN (GLUCOPHAGE) 500 MG tablet Take 1,000 mg by mouth 2 (two) times daily with a meal.     [provider]  metoprolol tartrate (LOPRESSOR) 25 MG tablet Take 0.5 tablets (12.5 mg total) by mouth 2  (two) times daily. Patient taking differently: Take 12.5 mg by mouth 2 (two) times daily. HOLD for SBP <100 or HR <60 05/20/16   Shaune Pollack,  MD  morphine (ROXANOL) 20 MG/ML concentrated solution Take 10 mg by mouth every 2 (two) hours as needed for severe pain.    [provider]  nystatin (NYSTATIN) powder Apply topically 2 (two) times daily as needed. For fungal growth    [provider]  omeprazole (PRILOSEC) 20 MG capsule Take 20 mg by mouth daily.    [provider]  potassium chloride (K-DUR) 10 MEQ tablet Take 10 mEq by mouth daily.    [provider]  predniSONE (STERAPRED UNI-PAK 21 TAB) 10 MG (21) TBPK tablet Take 1 tablet (10 mg total) by mouth daily. Take 6 tablets by mouth for 1 day followed by  5 tablets by mouth for 1 day followed by  4 tablets by mouth for 1 day followed by  3 tablets by mouth for 1 day followed by  2 tablets by mouth for 1 day followed by  1 tablet by mouth for a day and stop Patient not taking: Reported on 06/03/2018 02/27/18   Ramonita LabGouru, Aruna, MD  prochlorperazine (COMPAZINE) 10 MG tablet Take 10 mg by mouth every 6 (six) hours as needed for nausea or vomiting.    [provider]  rosuvastatin (CRESTOR) 40 MG tablet Take 40 mg by mouth every evening.     [provider]  tiotropium (SPIRIVA) 18 MCG inhalation capsule Place 18 mcg into inhaler and inhale daily.    [provider]     Critical care time: 50 mins   Posey BoyerBrooke Cowan Pilar, AGACNP-BC [facility] Pgr: [phone] or if no answer [phone] 06/14/2018, 8:46 PM

## 2018-06-14 NOTE — ED Provider Notes (Signed)
[facility] Emergency Department Provider Note  ____________________________________________   First MD Initiated Contact with Patient 06/14/18 1539     (approximate)  I have reviewed the triage vital signs and the nursing notes.   HISTORY  Chief Complaint Loss of Consciousness and Shortness of Breath   HPI Sheila Hayes is a 77 y.o. female with a history of COPD as well as CHF and diabetes who is presenting to the emergency department today with shortness of breath.  EMS reported that they were told that the patient did syncopized with the patient denies passing out.  No falls reported.  Wheezing reported in route with the patient on her baseline 2 L of nasal cannula oxygen sustained in the 80s on pulse ox.  However, increased to 4 L and now in the 90s.  However, upon arrival to the emergency department the patient is on a nonrebreather mask and is only 88% oxygen saturation.   Past Medical History:  Diagnosis Date  . Chronic diastolic congestive heart failure (HCC)   . COPD (chronic obstructive pulmonary disease) (HCC)   . Diabetes mellitus without complication (HCC)   . Hyperlipidemia   . Hypertension   . Hypothyroidism   . Morbid obesity (HCC)   . Pulmonary hypertension [facility])     Patient Active Problem List   Diagnosis Date Noted  . Acute on chronic diastolic CHF (congestive heart failure) (HCC) 02/23/2018  . Altered mental status   . Urinary tract infection without hematuria   . Acute respiratory failure with hypoxia and hypercapnia (HCC) 01/30/2018  . Near syncope 10/09/2017  . HTN (hypertension) 10/09/2017  . Diabetes (HCC) 10/09/2017  . HLD (hyperlipidemia) 10/09/2017  . Chronic diastolic CHF (congestive heart failure) (HCC) 10/09/2017  . Hypothyroidism 10/09/2017  . COPD exacerbation (HCC) 05/13/2016    Past Surgical History:  Procedure Laterality Date  . APPENDECTOMY    . CHOLECYSTECTOMY      Prior to Admission medications    Medication Sig Start Date End Date Taking? Authorizing Provider  acetaminophen (TYLENOL) 500 MG tablet Take 500 mg by mouth every 6 (six) hours as needed for headache.    [provider]  albuterol (PROVENTIL HFA;VENTOLIN HFA) 108 (90 Base) MCG/ACT inhaler Inhale 2 puffs into the lungs every 6 (six) hours as needed for wheezing or shortness of breath.    [provider]  budesonide-formoterol (SYMBICORT) 80-4.5 MCG/ACT inhaler Inhale 2 puffs into the lungs 2 (two) times daily.    [provider]  calcium carbonate (OSCAL) 1500 (600 Ca) MG TABS tablet Take 600 mg of elemental calcium by mouth 2 (two) times daily with a meal.     [provider]  citalopram (CELEXA) 40 MG tablet Take 40 mg by mouth daily.    [provider]  docusate sodium (COLACE) 100 MG capsule Take 200 mg by mouth at bedtime.    [provider]  furosemide (LASIX) 20 MG tablet Take 20 mg by mouth daily.    [provider]  gabapentin (NEURONTIN) 100 MG capsule Take 200 mg by mouth 2 (two) times daily.    [provider]  glipiZIDE (GLUCOTROL) 10 MG tablet Take 10 mg by mouth daily before breakfast.    [provider]  levothyroxine (SYNTHROID, LEVOTHROID) 125 MCG tablet Take 125 mcg by mouth daily before breakfast.    [provider]  LORazepam (ATIVAN) 0.5 MG tablet Take 0.5 mg by mouth every 4 (four) hours as needed for anxiety.  [provider]  metFORMIN (GLUCOPHAGE) 500 MG tablet Take 1,000 mg by mouth 2 (two) times daily with a meal.     [provider]  metoprolol tartrate (LOPRESSOR) 25 MG tablet Take 0.5 tablets (12.5 mg total) by mouth 2 (two) times daily. Patient taking differently: Take 12.5 mg by mouth 2 (two) times daily. HOLD for SBP <100 or HR <60 05/20/16   Shaune Pollack, MD  morphine (ROXANOL) 20 MG/ML concentrated solution Take 10 mg by mouth every 2 (two) hours as needed for severe pain.    [provider]  nystatin (NYSTATIN) powder Apply topically 2 (two) times daily as needed. For fungal growth    [provider]  omeprazole (PRILOSEC) 20 MG capsule Take 20 mg by mouth daily.    [provider]  potassium chloride (K-DUR) 10 MEQ tablet Take 10 mEq by mouth daily.    [provider]  predniSONE (STERAPRED UNI-PAK 21 TAB) 10 MG (21) TBPK tablet Take 1 tablet (10 mg total) by mouth daily. Take 6 tablets by mouth for 1 day followed by  5 tablets by mouth for 1 day followed by  4 tablets by mouth for 1 day followed by  3 tablets by mouth for 1 day followed by  2 tablets by mouth for 1 day followed by  1 tablet by mouth for a day and stop Patient not taking: Reported on 06/03/2018 02/27/18   Ramonita Lab, MD  prochlorperazine (COMPAZINE) 10 MG tablet Take 10 mg by mouth every 6 (six) hours as needed for nausea or vomiting.    [provider]  rosuvastatin (CRESTOR) 40 MG tablet Take 40 mg by mouth every evening.     [provider]  tiotropium (SPIRIVA) 18 MCG inhalation capsule Place 18 mcg into inhaler and inhale daily.    [provider]    Allergies Patient has no known allergies.  Family History  Problem Relation Age of Onset  . Heart attack Mother   . Heart attack Father   . Diabetes Sister   . Hypertension Sister   . Hypertension Brother   . Diabetes Brother   . Lung cancer Brother   . Colon cancer Brother     Social History Social History   Tobacco Use  . Smoking status: Former Games developer  . Smokeless tobacco: Never Used  Substance Use Topics  . Alcohol use: No  . Drug use: No    Review of Systems  Constitutional: No fever/chills Eyes: No visual changes. ENT: No sore throat. Cardiovascular: Denies chest pain. Respiratory: As above Gastrointestinal: No abdominal pain.  No nausea, no vomiting.  No diarrhea.  No constipation. Genitourinary: Negative for dysuria. Musculoskeletal: Negative for back  pain. Skin: Negative for rash. Neurological: Negative for headaches, focal weakness or numbness.   ____________________________________________   PHYSICAL EXAM:  VITAL SIGNS: ED Triage Vitals [06/14/18 1536]  Enc Vitals Group     BP      Pulse      Resp      Temp      Temp src      SpO2      Weight 245 lb (111.1 kg)     Height 5\' 7"  (1.702 m)     Head Circumference      Peak Flow      Pain Score 0     Pain Loc      Pain Edu?      Excl. in GC?     Constitutional:  Alert and oriented.  Labored respirations, using accessory muscles but able to speak in full sentences. Eyes: Conjunctivae are normal.  Head: Atraumatic. Nose: No congestion/rhinnorhea. Mouth/Throat: Mucous membranes are moist.  Neck: No stridor.   Cardiovascular: Tachycardic, regular rhythm. Grossly normal heart sounds.   Respiratory: Respirations with decreased air movement throughout.  Expiratory cough with a wet sound to it.  Diffuse coarse wheezing. Gastrointestinal: Soft and nontender. No distention. Musculoskeletal: No lower extremity tenderness nor edema.  No joint effusions. Neurologic:  Normal speech and language. No gross focal neurologic deficits are appreciated. Skin:  Skin is warm, dry and intact. No rash noted. Psychiatric: Mood and affect are normal. Speech and behavior are normal.  ____________________________________________   LABS (all labs ordered are listed, but only abnormal results are displayed)  Labs Reviewed  COMPREHENSIVE METABOLIC PANEL - Abnormal; Notable for the following components:      Result Value   Chloride 90 (*)    CO2 42 (*)    Glucose, Bld 110 (*)    Albumin 3.4 (*)    AST 13 (*)    All other components within normal limits  CBC WITH DIFFERENTIAL/PLATELET - Abnormal; Notable for the following components:   Hemoglobin 10.8 (*)    MCHC 28.3 (*)    All other components within normal limits  BLOOD GAS, ARTERIAL - Abnormal; Notable for the following components:    pH, Arterial 7.32 (*)    pCO2 arterial 91 (*)    pO2, Arterial 69 (*)    Bicarbonate 46.9 (*)    Acid-Base Excess 16.1 (*)    All other components within normal limits  CULTURE, BLOOD (ROUTINE X 2)  CULTURE, BLOOD (ROUTINE X 2)  URINE CULTURE  BRAIN NATRIURETIC PEPTIDE  TROPONIN I  INFLUENZA PANEL BY PCR (TYPE A & B)  URINALYSIS, ROUTINE W REFLEX MICROSCOPIC  I-STAT CG4 LACTIC ACID, ED  I-STAT CG4 LACTIC ACID, ED  CG4 I-STAT (LACTIC ACID)   ____________________________________________  EKG  ED ECG REPORT I, Arelia Longestavid M Claire Dolores, the attending physician, personally viewed and interpreted this ECG.   Date: 06/14/2018  EKG Time: 1545  Rate: 108  Rhythm: Sinus tachycardia.  Appears to be sinus tachycardia versus atrial flutter read by the EKG machine.  Possible bundle branch block pattern altering the machine read.  Axis: Normal  Intervals:right bundle branch block  ST&T Change: No ST segment elevation or depression.  T wave inversions in V2 and V3.  ____________________________________________  RADIOLOGY  No acute finding on the chest x-ray. ____________________________________________   PROCEDURES  Procedure(s) performed:   .Critical Care Performed by: Myrna BlazerSchaevitz, Imane Burrough Matthew, MD Authorized by: Myrna BlazerSchaevitz, Javoni Lucken Matthew, MD   Critical care provider statement:    Critical care time (minutes):  35   Critical care time was exclusive of:  Separately billable procedures and treating other patients   Critical care was necessary to treat or prevent imminent or life-threatening deterioration of the following conditions:  Respiratory failure   Critical care was time spent personally by me on the following activities:  Development of treatment plan with patient or surrogate, discussions with consultants, evaluation of patient's response to treatment, examination of patient, obtaining history from patient or surrogate, ordering and performing treatments and interventions, ordering  and review of laboratory studies, ordering and review of radiographic studies, pulse oximetry, re-evaluation of patient's condition and review of old charts    Critical Care performed:    ____________________________________________   INITIAL IMPRESSION / ASSESSMENT AND PLAN / ED COURSE  Pertinent labs & imaging results that were available during my care of the patient were reviewed by me and considered in my medical decision making (see chart for details).  Differential includes, but is not limited to, viral syndrome, bronchitis including COPD exacerbation, pneumonia, reactive airway disease including asthma, CHF including exacerbation with or without pulmonary/interstitial edema, pneumothorax, ACS, thoracic trauma, and pulmonary embolism. As part of my medical decision making, I reviewed the following data within the electronic MEDICAL RECORD NUMBER Notes from prior ED visits  Patient will be started on BiPAP because of work of breathing as well as poor oxygen saturation on nonrebreather mask.  ----------------------------------------- 5:02 PM on 06/14/2018 -----------------------------------------  Patient at this time with an elevated PCO2 of 91 but appears to be near baseline for this patient.  She seems to be more awake and alert and is able to answer my questions.  Tolerating the BiPAP well.  Unfortunately, there are [facility] available [facility] and the patient will need to be [facility].  I discussed the case with Dr. Craige CottaSood of the ICU and he accepts the patient onto his service.  Patient aware of need for transfer. ____________________________________________   FINAL CLINICAL IMPRESSION(S) / ED DIAGNOSES  COPD exacerbation.  NEW MEDICATIONS STARTED DURING THIS VISIT:  New Prescriptions   No medications on file     Note:  This document was prepared using Dragon voice recognition software and may include unintentional dictation  errors.     Myrna BlazerSchaevitz, Callin Ashe Matthew, MD 06/14/18 [phone]

## 2018-06-15 DIAGNOSIS — J9602 Acute respiratory failure with hypercapnia: Secondary | ICD-10-CM

## 2018-06-15 LAB — CBC
HCT: 33.9 % — ABNORMAL LOW (ref 36.0–46.0)
HEMOGLOBIN: 9.6 g/dL — AB (ref 12.0–15.0)
MCH: 26.4 pg (ref 26.0–34.0)
MCHC: 28.3 g/dL — ABNORMAL LOW (ref 30.0–36.0)
MCV: 93.1 fL (ref 80.0–100.0)
Platelets: 170 10*3/uL (ref 150–400)
RBC: 3.64 MIL/uL — ABNORMAL LOW (ref 3.87–5.11)
RDW: 13.3 % (ref 11.5–15.5)
WBC: 7.6 10*3/uL (ref 4.0–10.5)
nRBC: 0 % (ref 0.0–0.2)

## 2018-06-15 LAB — GLUCOSE, CAPILLARY
GLUCOSE-CAPILLARY: 292 mg/dL — AB (ref 70–99)
Glucose-Capillary: 189 mg/dL — ABNORMAL HIGH (ref 70–99)
Glucose-Capillary: 203 mg/dL — ABNORMAL HIGH (ref 70–99)
Glucose-Capillary: 218 mg/dL — ABNORMAL HIGH (ref 70–99)
Glucose-Capillary: 220 mg/dL — ABNORMAL HIGH (ref 70–99)
Glucose-Capillary: 277 mg/dL — ABNORMAL HIGH (ref 70–99)

## 2018-06-15 LAB — RESPIRATORY PANEL BY PCR

## 2018-06-15 LAB — POCT I-STAT 3, ART BLOOD GAS (G3+)
Acid-Base Excess: 16 mmol/L — ABNORMAL HIGH (ref 0.0–2.0)
Bicarbonate: 44.4 mmol/L — ABNORMAL HIGH (ref 20.0–28.0)
O2 Saturation: 88 %
PO2 ART: 63 mmHg — AB (ref 83.0–108.0)
TCO2: 47 mmol/L — ABNORMAL HIGH (ref 22–32)
pCO2 arterial: 82.8 mmHg (ref 32.0–48.0)
pH, Arterial: 7.338 — ABNORMAL LOW (ref 7.350–7.450)

## 2018-06-15 LAB — BASIC METABOLIC PANEL
Anion gap: 9 (ref 5–15)
BUN: 11 mg/dL (ref 8–23)
CO2: 40 mmol/L — ABNORMAL HIGH (ref 22–32)
CREATININE: 0.82 mg/dL (ref 0.44–1.00)
Calcium: 8.6 mg/dL — ABNORMAL LOW (ref 8.9–10.3)
Chloride: 91 mmol/L — ABNORMAL LOW (ref 98–111)
GFR calc Af Amer: >60 mL/min (ref >60)
GFR calc non Af Amer: >60 mL/min (ref >60)
Glucose, Bld: 243 mg/dL — ABNORMAL HIGH (ref 70–99)
Potassium: 3.6 mmol/L (ref 3.5–5.1)
Sodium: 140 mmol/L (ref 135–145)

## 2018-06-15 LAB — MAGNESIUM: Magnesium: 1.8 mg/dL (ref 1.7–2.4)

## 2018-06-15 LAB — MRSA PCR SCREENING: MRSA by PCR: NEGATIVE

## 2018-06-15 LAB — PHOSPHORUS: Phosphorus: 2.9 mg/dL (ref 2.5–4.6)

## 2018-06-15 LAB — PROCALCITONIN: Procalcitonin: <0.1 ng/mL

## 2018-06-15 NOTE — Progress Notes (Addendum)
NAME:  Sheila ModenaSarah F Hayes, MRN:  [id number], DOB:  05/04/1941, LOS: 1 ADMISSION DATE:  06/14/2018, CONSULTATION DATE:  06/14/2018 REFERRING MD: Dr. Pershing ProudSchaevitz , CHIEF COMPLAINT:  SOB/ Hypoxia  Brief History   77 year old female SNF resident with COPD with episode of vomiting and diarrhea and [facility].  Ongoing hypoxia and increased work of breathing requiring BiPAP.   Past Medical History  HTN, DMII, Hypothryoidism, CHF NYHA Class 2, pulmonary hypertension,and COPD  Significant Hospital Events   12/24 Admit  Consults:   Procedures:   Significant Diagnostic Tests:   Micro Data:  12/24 MRSA PCR   Antimicrobials:  12/24 Azithro  Interim history/subjective:  Off BiPAP today morning.  States that breathing is doing okay.  Objective   Blood pressure (!) 117/59, pulse 91, temperature 97.8 F (36.6 C), temperature source Axillary, resp. rate (!) 23, weight 107 kg, SpO2 (!) 88 %.    FiO2 (%):  [40 %] 40 %   Intake/Output Summary (Last 24 hours) at 06/15/2018 1056 Last data filed at 06/15/2018 0900 Gross per 24 hour  Intake 100 ml  Output 125 ml  Net -25 ml   Filed Weights   06/15/18 0401  Weight: 107 kg   Examination: Gen:      No acute distress HEENT:  EOMI, sclera anicteric Neck:     No masses; no thyromegaly Lungs:    Bilateral expiratory wheeze. CV:         Regular rate and rhythm; no murmurs Abd:      + bowel sounds; soft, non-tender; no palpable masses, no distension Ext:    No edema; adequate peripheral perfusion Skin:      Warm and dry; no rash Neuro: alert and oriented x 3 Psych: normal mood and affect  Resolved Hospital Problem list    Assessment & Plan:  Acute on chronic hypoxic respiratory failure in the setting of end stage COPD +/- AECOPD on baseline home O2 3L - vomiting episode precipitated worsening hypoxia and work breathing however no obvious infiltrate on CXR - flu negative, no WBC, and afebrile - also some question of  possible syncopal episode concerning, may have been related to hypoxia, or given hx of pulmonary hypertension (although her last 2 TTE do not mention PASP pressures), concern for possible PE- although trop/ BNP neg less likely to have significant PE with right heart strain P:  Off BiPAP now. Wean down oxygen as tolerated Continue nebulizers, Solu-Medrol, Zithromax Hold morphine (unclear why she is on this and not a full DNR)  Acute encephalopathy from above > resolved P:  Monitor status.  Reported vomiting/ diarrhea - none reported since being in ER or here P:  Start clears.  Monitor status.  DMT2 P:  CBG q4 SSI   HTN, heart failure (TTE 09/2017 EF 55-60%, mod TR), ?pulmonary hypertension, HLD P:  Hold lopressor, lasix Resume crestor when able to take POs/ more awake  Hypothyroidism P:  Continue levothyroxine.  Anxiety/ depression P:  Hold ativan and celexa for now  Fungal rash in breast folds Nystatin as needed  Goals of care No CPR. Wants intubation if needed  Best practice:  Diet: NPO until mental status is improved Pain/Anxiety/Delirium protocol (if indicated): n/a VAP protocol (if indicated): n/a DVT prophylaxis: heparin SQ GI prophylaxis: protonix Glucose control: SSI Mobility: BR Code Status: Limited, no CPR/ defib but yes to everything else Family Communication: see above, spoke with Inetta Fermoina, patient's niece and HPOA.  No Family at  bedside 12/25. Disposition: Stable for transfer out of ICU   Chilton GreathousePraveen Alexxander Kurt MD [place] [facility] Pager [phone] If no answer or after 3pm call: [phone] 06/15/2018, 10:56 AM

## 2018-06-16 ENCOUNTER — Other Ambulatory Visit: Payer: Self-pay

## 2018-06-16 DIAGNOSIS — I5032 Chronic diastolic (congestive) heart failure: Secondary | ICD-10-CM

## 2018-06-16 DIAGNOSIS — J441 Chronic obstructive pulmonary disease with (acute) exacerbation: Secondary | ICD-10-CM

## 2018-06-16 DIAGNOSIS — E785 Hyperlipidemia, unspecified: Secondary | ICD-10-CM

## 2018-06-16 DIAGNOSIS — E039 Hypothyroidism, unspecified: Secondary | ICD-10-CM

## 2018-06-16 DIAGNOSIS — E1169 Type 2 diabetes mellitus with other specified complication: Secondary | ICD-10-CM

## 2018-06-16 LAB — GLUCOSE, CAPILLARY
Glucose-Capillary: 161 mg/dL — ABNORMAL HIGH (ref 70–99)
Glucose-Capillary: 183 mg/dL — ABNORMAL HIGH (ref 70–99)
Glucose-Capillary: 196 mg/dL — ABNORMAL HIGH (ref 70–99)
Glucose-Capillary: 259 mg/dL — ABNORMAL HIGH (ref 70–99)
Glucose-Capillary: 259 mg/dL — ABNORMAL HIGH (ref 70–99)
Glucose-Capillary: 349 mg/dL — ABNORMAL HIGH (ref 70–99)

## 2018-06-16 MED ORDER — METHYLPREDNISOLONE SODIUM SUCC 40 MG IJ SOLR
40.0000 mg | Freq: Every day | INTRAMUSCULAR | Status: DC
  Administered 2018-06-17 – 2018-06-18 (×2): 40 mg via INTRAVENOUS
  Filled 2018-06-16 (×2): qty 1

## 2018-06-16 MED ORDER — CITALOPRAM HYDROBROMIDE 20 MG PO TABS
40.0000 mg | ORAL_TABLET | Freq: Every day | ORAL | Status: DC
  Administered 2018-06-16 – 2018-06-18 (×3): 40 mg via ORAL
  Filled 2018-06-16 (×3): qty 2

## 2018-06-16 MED ORDER — FUROSEMIDE 20 MG PO TABS
20.0000 mg | ORAL_TABLET | Freq: Every day | ORAL | Status: DC
  Administered 2018-06-17 – 2018-06-18 (×2): 20 mg via ORAL
  Filled 2018-06-16 (×2): qty 1

## 2018-06-16 MED ORDER — AZITHROMYCIN 250 MG PO TABS
500.0000 mg | ORAL_TABLET | Freq: Every evening | ORAL | Status: DC
  Administered 2018-06-16 – 2018-06-17 (×2): 500 mg via ORAL
  Filled 2018-06-16 (×2): qty 2

## 2018-06-16 MED ORDER — ROSUVASTATIN CALCIUM 20 MG PO TABS
40.0000 mg | ORAL_TABLET | Freq: Every evening | ORAL | Status: DC
  Administered 2018-06-16 – 2018-06-17 (×2): 40 mg via ORAL
  Filled 2018-06-16 (×2): qty 2

## 2018-06-16 MED ORDER — METOPROLOL TARTRATE 12.5 MG HALF TABLET
12.5000 mg | ORAL_TABLET | Freq: Two times a day (BID) | ORAL | Status: DC
  Administered 2018-06-16 – 2018-06-17 (×4): 12.5 mg via ORAL
  Filled 2018-06-16 (×5): qty 1

## 2018-06-16 MED ORDER — PANTOPRAZOLE SODIUM 40 MG PO TBEC
40.0000 mg | DELAYED_RELEASE_TABLET | Freq: Every day | ORAL | Status: DC
  Administered 2018-06-16 – 2018-06-17 (×2): 40 mg via ORAL
  Filled 2018-06-16 (×2): qty 1

## 2018-06-16 MED ORDER — INSULIN ASPART 100 UNIT/ML ~~LOC~~ SOLN
0.0000 [IU] | Freq: Three times a day (TID) | SUBCUTANEOUS | Status: DC
  Administered 2018-06-16: 5 [IU] via SUBCUTANEOUS
  Administered 2018-06-16: 8 [IU] via SUBCUTANEOUS
  Administered 2018-06-17: 5 [IU] via SUBCUTANEOUS
  Administered 2018-06-17: 8 [IU] via SUBCUTANEOUS
  Administered 2018-06-17 – 2018-06-18 (×3): 2 [IU] via SUBCUTANEOUS

## 2018-06-16 NOTE — Progress Notes (Signed)
PROGRESS NOTE    Sheila Hayes  ZOX:[id number] DOB: Oct 08, 1940 DOA: 06/14/2018 PCP: Almetta Lovely, [facility]   Brief Narrative: Sheila Hayes is a 77 y.o. female with a history of chronic respiratory failure, chronic diastolic heart failure, end stage COPD, hyperlipidemia, hypothyroidism, hypertension. She presented secondary to hypoxia secondary to COPD exacerbation in setting of emesis episode and possible aspiration.   Assessment & Plan:   Active Problems:   Acute respiratory failure with hypercapnia (HCC)  Acute on chronic respiratory failure with hypoxia and hypercapnia Secondary to emesis. Improved currently and at baseline.  COPD exacerbation -Continue Azithromycin (5 day course) -Continue Duoneb, Pulmicort, solu-medrol -Discontinue Roxanol on discharge  Essential hypertension -Continue metoprolol  Hyperlipidemia -Continue Crestor  Diabetes mellitus, type 2 On metformin and glipizide as an outpatient. -Continue SSI  Hypothyroidism -Continue Synthroid  Depression -Celexa  Chronic diastolic heart failure On lasix as an outpatient. -Resume   DVT prophylaxis: heparin Code Status:   Code Status: Partial Code Family Communication: None at bedside Disposition Plan: Discharge pending PT evaluation   Consultants:   Critical care medicine  Procedures:   Bipap  Antimicrobials:  Azithromycin    Subjective: Breathing improved since admission.   Objective: Vitals:   06/16/18 0025 06/16/18 0500 06/16/18 0734 06/16/18 0846  BP: 127/65  (!) 132/55   Pulse: 85  70 70  Resp: (!) 24  12 18   Temp: 98.3 F (36.8 C)  98.3 F (36.8 C)   TempSrc: Oral  Oral   SpO2: 95%  96% 96%  Weight:  107.8 kg      Intake/Output Summary (Last 24 hours) at 06/16/2018 1040 Last data filed at 06/16/2018 0500 Gross per 24 hour  Intake 360 ml  Output 2150 ml  Net -1790 ml   Filed Weights   06/15/18 0401 06/16/18 0500  Weight: 107 kg 107.8 kg     Examination:  General exam: Appears calm and comfortable Respiratory system: Expiratory rhonchi which are transmitted from the upper airway. Respiratory effort normal. Cardiovascular system: S1 & S2 heard, RRR. No murmurs, rubs, gallops or clicks. Gastrointestinal system: Abdomen is nondistended, soft and nontender. No organomegaly or masses felt. Normal bowel sounds heard. Central nervous system: Alert and oriented. No focal neurological deficits. Extremities: No edema. No calf tenderness Skin: No cyanosis. No rashes Psychiatry: Judgement and insight appear normal. Mood & affect appropriate.     Data Reviewed: I have personally reviewed following labs and imaging studies  CBC: Recent Labs  Lab 06/14/18 1542 06/14/18 2023 06/15/18 0333  WBC 9.6 10.3 7.6  NEUTROABS 7.4  --   --   HGB 10.8* 10.1* 9.6*  HCT 38.2 35.5* 33.9*  MCV 94.8 95.4 93.1  PLT 188 180 170   Basic Metabolic Panel: Recent Labs  Lab 06/14/18 1542 06/14/18 2023 06/15/18 0333  NA 141  --  140  K 3.6  --  3.6  CL 90*  --  91*  CO2 42*  --  40*  GLUCOSE 110*  --  243*  BUN 12  --  11  CREATININE 0.82 0.85 0.82  CALCIUM 9.0  --  8.6*  MG  --   --  1.8  PHOS  --   --  2.9   GFR: Estimated Creatinine Clearance: 73.8 mL/min (by C-G formula based on SCr of 0.82 mg/dL). Liver Function Tests: Recent Labs  Lab 06/14/18 1542  AST 13*  ALT 8  ALKPHOS 48  BILITOT 0.4  PROT 6.9  ALBUMIN 3.4*  No results for input(s): LIPASE, AMYLASE in the last 168 hours. No results for input(s): AMMONIA in the last 168 hours. Coagulation Profile: No results for input(s): INR, PROTIME in the last 168 hours. Cardiac Enzymes: Recent Labs  Lab 06/14/18 1542  TROPONINI <0.03   BNP (last 3 results) No results for input(s): PROBNP in the last 8760 hours. HbA1C: No results for input(s): HGBA1C in the last 72 hours. CBG: Recent Labs  Lab 06/15/18 1637 06/15/18 1946 06/16/18 0022 06/16/18 0345 06/16/18 0734   GLUCAP 277* 292* 196* 161* 183*   Lipid Profile: No results for input(s): CHOL, HDL, LDLCALC, TRIG, CHOLHDL, LDLDIRECT in the last 72 hours. Thyroid Function Tests: No results for input(s): TSH, T4TOTAL, FREET4, T3FREE, THYROIDAB in the last 72 hours. Anemia Panel: No results for input(s): VITAMINB12, FOLATE, FERRITIN, TIBC, IRON, RETICCTPCT in the last 72 hours. Sepsis Labs: Recent Labs  Lab 06/14/18 1547 06/14/18 2023 06/15/18 0333  PROCALCITON  --  <0.10 <0.10  LATICACIDVEN 1.72  --   --     Recent Results (from the past 240 hour(s))  Blood Culture (routine x 2)     Status: None (Preliminary result)   Collection Time: 06/14/18  3:43 PM  Result Value Ref Range Status   Specimen Description BLOOD LEFT ANTECUBITAL  Final   Special Requests   Final    BOTTLES DRAWN AEROBIC AND ANAEROBIC Blood Culture results may not be optimal due to an excessive volume of blood received in culture bottles   Culture   Final    NO GROWTH 2 DAYS Performed at [facility], [address] [place], [place] [address]    Report Status PENDING  Incomplete  Blood Culture (routine x 2)     Status: None (Preliminary result)   Collection Time: 06/14/18  3:43 PM  Result Value Ref Range Status   Specimen Description BLOOD BLOOD LEFT FOREARM  Final   Special Requests   Final    BOTTLES DRAWN AEROBIC AND ANAEROBIC Blood Culture results may not be optimal due to an excessive volume of blood received in culture bottles   Culture   Final    NO GROWTH 2 DAYS Performed at [facility], [address] [place], [place] [address]    Report Status PENDING  Incomplete  MRSA PCR Screening     Status: None   Collection Time: 06/15/18  2:15 AM  Result Value Ref Range Status   MRSA by PCR NEGATIVE NEGATIVE Final    Comment:        The GeneXpert MRSA Assay (FDA approved for NASAL specimens only), is one component of a comprehensive MRSA colonization surveillance program. It is not intended  to diagnose MRSA infection nor to guide or monitor treatment for MRSA infections. Performed at [facility], [address] [place], [place] [address]   Respiratory Panel by PCR     Status: None   Collection Time: 06/15/18 12:32 PM  Result Value Ref Range Status   Adenovirus NOT DETECTED NOT DETECTED Final   Coronavirus 229E NOT DETECTED NOT DETECTED Final   Coronavirus HKU1 NOT DETECTED NOT DETECTED Final   Coronavirus NL63 NOT DETECTED NOT DETECTED Final   Coronavirus OC43 NOT DETECTED NOT DETECTED Final   Metapneumovirus NOT DETECTED NOT DETECTED Final   Rhinovirus / Enterovirus NOT DETECTED NOT DETECTED Final   Influenza A NOT DETECTED NOT DETECTED Final   Influenza B NOT DETECTED NOT DETECTED Final   Parainfluenza Virus 1 NOT DETECTED NOT  DETECTED Final   Parainfluenza Virus 2 NOT DETECTED NOT DETECTED Final   Parainfluenza Virus 3 NOT DETECTED NOT DETECTED Final   Parainfluenza Virus 4 NOT DETECTED NOT DETECTED Final   Respiratory Syncytial Virus NOT DETECTED NOT DETECTED Final   Bordetella pertussis NOT DETECTED NOT DETECTED Final   Chlamydophila pneumoniae NOT DETECTED NOT DETECTED Final   Mycoplasma pneumoniae NOT DETECTED NOT DETECTED Final    Comment: Performed at [facility], [address] [place], [place] [address]         Radiology Studies: Dg Chest Port 1 View  Result Date: 06/14/2018 CLINICAL DATA:  Patient vomited this morning syncopal episode and dyspnea. EXAM: PORTABLE CHEST 1 VIEW COMPARISON:  None. FINDINGS: Lung volumes are slightly low. Stable borderline cardiomegaly with aortic atherosclerosis. Mild chronic interstitial prominence without alveolar consolidation nor overt pulmonary edema. No acute osseous abnormality. IMPRESSION: 1. No active disease. 2. Mild chronic interstitial prominence without alveolar consolidation nor overt pulmonary edema. Electronically Signed   By: Tollie Ethavid  Kwon M.D.   On: 06/14/2018 15:54        Scheduled  Meds: . budesonide (PULMICORT) nebulizer solution  0.5 mg Nebulization BID  . chlorhexidine  15 mL Mouth Rinse BID  . citalopram  40 mg Oral Daily  . heparin  5,000 Units Subcutaneous Q8H  . insulin aspart  0-15 Units Subcutaneous TID WC  . ipratropium-albuterol  3 mL Nebulization Q6H  . levothyroxine  125 mcg Oral Q0600  . mouth rinse  15 mL Mouth Rinse q12n4p  . methylPREDNISolone (SOLU-MEDROL) injection  40 mg Intravenous Q6H  . metoprolol tartrate  12.5 mg Oral BID  . pantoprazole (PROTONIX) IV  40 mg Intravenous Q24H  . rosuvastatin  40 mg Oral QPM   Continuous Infusions: . azithromycin Stopped (06/15/18 2001)     LOS: 2 days     Jacquelin Hawkingalph Tamkia Temples, MD [facility] 06/16/2018, 10:40 AM  If 7PM-7AM, please contact night-coverage [email or web address]

## 2018-06-16 NOTE — Social Work (Signed)
Pt is from [facility] not [facility], CSW continuing to follow for support with disposition when medically appropriate.  Octavio GravesIsabel Archana Eckman, MSW, [facility] [place] Clinical Social Work [phone]

## 2018-06-16 NOTE — Progress Notes (Signed)
PT Cancellation Note  Patient Details Name: Sheila ModenaSarah F Hayes MRN: [id number] DOB: [birth year]   Cancelled Treatment:    Reason Eval/Treat Not Completed: Medical issues which prohibited therapy Pt currently on BiPAP- increased work of breathing. Will follow up as pt appropriate.    Blake DivineShauna A Kyshawn Teal 06/16/2018, 12:22 PM  Mylo RedShauna Otilia Kareem, PT, DPT Acute Rehabilitation Services Pager [phone] Office [phone]

## 2018-06-17 DIAGNOSIS — J9602 Acute respiratory failure with hypercapnia: Secondary | ICD-10-CM

## 2018-06-17 LAB — GLUCOSE, CAPILLARY
GLUCOSE-CAPILLARY: 270 mg/dL — AB (ref 70–99)
Glucose-Capillary: 134 mg/dL — ABNORMAL HIGH (ref 70–99)
Glucose-Capillary: 209 mg/dL — ABNORMAL HIGH (ref 70–99)
Glucose-Capillary: 247 mg/dL — ABNORMAL HIGH (ref 70–99)

## 2018-06-17 MED ORDER — AZITHROMYCIN 250 MG PO TABS: 500.0000 mg | ORAL_TABLET | Freq: Every day | ORAL | Status: DC

## 2018-06-17 MED ORDER — ORAL CARE MOUTH RINSE
15.0000 mL | Freq: Two times a day (BID) | OROMUCOSAL | Status: DC
  Administered 2018-06-17 – 2018-06-18 (×3): 15 mL via OROMUCOSAL

## 2018-06-17 MED ORDER — PREDNISONE 10 MG PO TABS: ORAL_TABLET | ORAL | Status: DC

## 2018-06-17 NOTE — Discharge Summary (Addendum)
Physician Discharge Summary  NORMAJEAN NASH WUJ:[id number] DOB: 03/21/1941 DOA: 06/14/2018  PCP: Almetta Lovely, [facility]  Admit date: 06/14/2018 Discharge date: 06/18/2018  Admitted From: ALF Disposition: ALF  Recommendations for Outpatient Follow-up:  1. Follow up with PCP in 1 week 2. Please obtain BMP/CBC in one week 3. Please follow up on the following pending results: None  Home Health: PT, OT Equipment/Devices: Wheelchair  Discharge Condition: Stable CODE STATUS: No CPR. Intubation/Bipap only Diet recommendation: Heart healthy/carb modified   Brief/Interim Summary:  Admission HPI written by Coralyn Helling, MD   History of present illness  HPI obtained from medical chart review as patient is encephalopathic and from talking to patient's niece, [name] who is her HPOA.  77 year old female with history significant for COPD, chronic hypoxic respiratory failure on 3L O2 baseline, HTN, DMT2, and hypothyroidism who presented from [facility] assisted living with shortness of breath and hypoxia.    Her niece reports there has been a stomach bug going around patient's SNF.  She was told patient had episode of nausea and vomiting and possible reported syncopal episode.  After that, patient was hypoxic in the low 80's requiring more oxygen.  She was taken to [facility] ER.  There she was afebrile and hemodynamically stable.  CXR did not show obvious inflirate.  Labs noted for flu negative, WBC 9.6, Hgb 10.8, K 3.6, lactic 1.72, BNP 90 and negative troponin.  ABG showed 7.32/ 91 /69 /46.9.  Due to poor work of breathing, hypercarbia, and hypoxia, patient placed on BiPAP.  Of note, patient is a no CPR/ defib however family still requesting intubation.  Due to no [facility], patient was transferred to [facility], PCCM accepting.      Hospital course:  Acute on chronic respiratory failure with hypoxia and hypercapnia Secondary to emesis. Improved currently and at baseline.  Continue 3L oxygen via [place].  COPD exacerbation Started on Azithromycin and Solumedrol. Given Duoneb, Pulmicort. Discharge on prednisone taper. Discontinued Roxanol on discharge.  Essential hypertension Continue metoprolol  Hyperlipidemia Continue Crestor  Diabetes mellitus, type 2 On metformin and glipizide as an outpatient. Resume on discharge. Consider discontinuing glipizide in setting of age.  Hypothyroidism Continue Synthroid  Depression Continue Celexa  Chronic diastolic heart failure On lasix as an outpatient. Resumed on discharge.  Discharge Diagnoses:  Active Problems:   Acute respiratory failure with hypercapnia [facility])    Discharge Instructions   Allergies as of 06/18/2018   No Known Allergies     Medication List    STOP taking these medications   morphine 20 MG/ML concentrated solution Commonly known as:  ROXANOL   predniSONE 10 MG (21) Tbpk tablet Commonly known as:  STERAPRED UNI-PAK 21 TAB Replaced by:  predniSONE 10 MG tablet     TAKE these medications   acetaminophen 500 MG tablet Commonly known as:  TYLENOL Take 500 mg by mouth every 6 (six) hours as needed for headache.   albuterol 108 (90 Base) MCG/ACT inhaler Commonly known as:  PROVENTIL HFA;VENTOLIN HFA Inhale 2 puffs into the lungs every 6 (six) hours as needed for wheezing or shortness of breath.   azithromycin 250 MG tablet Commonly known as:  ZITHROMAX Take 2 tablets (500 mg total) by mouth daily for 2 days.   budesonide-formoterol 80-4.5 MCG/ACT inhaler Commonly known as:  SYMBICORT Inhale 2 puffs into the lungs 2 (two) times daily.   calcium carbonate 1500 (600 Ca) MG Tabs tablet Commonly known as:  OSCAL Take 600  mg of elemental calcium by mouth 2 (two) times daily with a meal.   citalopram 40 MG tablet Commonly known as:  CELEXA Take 40 mg by mouth daily.   docusate sodium 100 MG capsule Commonly known as:  COLACE Take 200 mg by mouth at bedtime.     furosemide 20 MG tablet Commonly known as:  LASIX Take 20 mg by mouth daily.   gabapentin 100 MG capsule Commonly known as:  NEURONTIN Take 200 mg by mouth 2 (two) times daily.   glipiZIDE 10 MG tablet Commonly known as:  GLUCOTROL Take 10 mg by mouth daily before breakfast.   levothyroxine 125 MCG tablet Commonly known as:  SYNTHROID, LEVOTHROID Take 125 mcg by mouth daily before breakfast.   LORazepam 0.5 MG tablet Commonly known as:  ATIVAN Take 0.5 mg by mouth every 4 (four) hours as needed for anxiety.   metFORMIN 500 MG tablet Commonly known as:  GLUCOPHAGE Take 1,000 mg by mouth 2 (two) times daily with a meal.   metoprolol tartrate 25 MG tablet Commonly known as:  LOPRESSOR Take 0.5 tablets (12.5 mg total) by mouth 2 (two) times daily. What changed:  additional instructions   nystatin powder Generic drug:  nystatin Apply topically 2 (two) times daily as needed. For fungal growth   omeprazole 20 MG capsule Commonly known as:  PRILOSEC Take 20 mg by mouth daily.   potassium chloride 10 MEQ tablet Commonly known as:  K-DUR Take 10 mEq by mouth daily.   predniSONE 10 MG tablet Commonly known as:  DELTASONE Take 4 tablets (40 mg total) by mouth daily with breakfast for 2 days, THEN 3 tablets (30 mg total) daily with breakfast for 3 days, THEN 2 tablets (20 mg total) daily with breakfast for 3 days, THEN 1 tablet (10 mg total) daily with breakfast for 3 days. Start taking on:  June 19, 2018 Replaces:  predniSONE 10 MG (21) Tbpk tablet   prochlorperazine 10 MG tablet Commonly known as:  COMPAZINE Take 10 mg by mouth every 6 (six) hours as needed for nausea or vomiting.   rosuvastatin 40 MG tablet Commonly known as:  CRESTOR Take 40 mg by mouth every evening.   tiotropium 18 MCG inhalation capsule Commonly known as:  SPIRIVA Place 18 mcg into inhaler and inhale daily.            Durable Medical Equipment  (From admission, onward)          Start     Ordered   06/17/18 1600  For home use only DME standard manual wheelchair with seat cushion  Once    Comments:  Patient suffers from COPD, chronic respiratory failure which impairs their ability to perform daily activities like bathing, dressing, grooming and toileting in the home.  A cane, crutch or walker will not resolve  issue with performing activities of daily living. A wheelchair will allow patient to safely perform daily activities. Patient can safely propel the wheelchair in the home or has a caregiver who can provide assistance.  Accessories: elevating leg rests (ELRs), wheel locks, extensions and anti-tippers.   06/17/18 1600          No Known Allergies  Consultations:  Critical care medicine   Procedures/Studies: Ct Head Wo Contrast  Result Date: 06/03/2018 CLINICAL DATA:  Head trauma-unwitnessed fall EXAM: CT HEAD WITHOUT CONTRAST TECHNIQUE: Contiguous axial images were obtained from the base of the skull through the vertex without intravenous contrast. COMPARISON:  03/05/2018 FINDINGS: Brain: No  evidence of acute infarction, hemorrhage, hydrocephalus, extra-axial collection or mass lesion/mass effect. Vascular: No hyperdense vessel or unexpected calcification. Skull: Posterior right occipital parietal scalp contusion without underlying calvarial fracture. No opaque foreign body. Sinuses/Orbits: Bilateral cataract resection. IMPRESSION: 1. No acute finding. 2. Right posterior scalp contusion without calvarial fracture. Electronically Signed   By: Marnee SpringJonathon  Watts M.D.   On: 06/03/2018 14:34   Dg Chest Port 1 View  Result Date: 06/14/2018 CLINICAL DATA:  Patient vomited this morning syncopal episode and dyspnea. EXAM: PORTABLE CHEST 1 VIEW COMPARISON:  None. FINDINGS: Lung volumes are slightly low. Stable borderline cardiomegaly with aortic atherosclerosis. Mild chronic interstitial prominence without alveolar consolidation nor overt pulmonary edema. No acute osseous  abnormality. IMPRESSION: 1. No active disease. 2. Mild chronic interstitial prominence without alveolar consolidation nor overt pulmonary edema. Electronically Signed   By: Tollie Ethavid  Kwon M.D.   On: 06/14/2018 15:54      Subjective: No chest pain today. Dyspnea is baseline.  Discharge Exam: Vitals:   06/17/18 0900 06/17/18 1420  BP:    Pulse:    Resp:    Temp:    SpO2: 97% 96%   Vitals:   06/17/18 0500 06/17/18 0725 06/17/18 0900 06/17/18 1420  BP:  127/63    Pulse:  61    Resp:  16    Temp:  98.4 F (36.9 C)    TempSrc:  Oral    SpO2:  97% 97% 96%  Weight: 105.1 kg       General: Pt is alert, awake, not in acute distress Cardiovascular: RRR, S1/S2 +, no rubs, no gallops Respiratory: CTA bilaterally, no wheezing, no rhonchi Abdominal: Soft, NT, ND, bowel sounds + Extremities: no edema, no cyanosis    The results of significant diagnostics from this hospitalization (including imaging, microbiology, ancillary and laboratory) are listed below for reference.     Microbiology: Recent Results (from the past 240 hour(s))  Blood Culture (routine x 2)     Status: None (Preliminary result)   Collection Time: 06/14/18  3:43 PM  Result Value Ref Range Status   Specimen Description BLOOD LEFT ANTECUBITAL  Final   Special Requests   Final    BOTTLES DRAWN AEROBIC AND ANAEROBIC Blood Culture results may not be optimal due to an excessive volume of blood received in culture bottles   Culture   Final    NO GROWTH 3 DAYS Performed at [facility], [address] [place], [place] [address]    Report Status PENDING  Incomplete  Blood Culture (routine x 2)     Status: None (Preliminary result)   Collection Time: 06/14/18  3:43 PM  Result Value Ref Range Status   Specimen Description BLOOD BLOOD LEFT FOREARM  Final   Special Requests   Final    BOTTLES DRAWN AEROBIC AND ANAEROBIC Blood Culture results may not be optimal due to an excessive volume of blood received in  culture bottles   Culture   Final    NO GROWTH 3 DAYS Performed at [facility], [address] [place], [place] [address]    Report Status PENDING  Incomplete  MRSA PCR Screening     Status: None   Collection Time: 06/15/18  2:15 AM  Result Value Ref Range Status   MRSA by PCR NEGATIVE NEGATIVE Final    Comment:        The GeneXpert MRSA Assay (FDA approved for NASAL specimens only), is one component of a comprehensive MRSA colonization surveillance program.  It is not intended to diagnose MRSA infection nor to guide or monitor treatment for MRSA infections. Performed at [facility], [address] [place], [place] [address]   Respiratory Panel by PCR     Status: None   Collection Time: 06/15/18 12:32 PM  Result Value Ref Range Status   Adenovirus NOT DETECTED NOT DETECTED Final   Coronavirus 229E NOT DETECTED NOT DETECTED Final   Coronavirus HKU1 NOT DETECTED NOT DETECTED Final   Coronavirus NL63 NOT DETECTED NOT DETECTED Final   Coronavirus OC43 NOT DETECTED NOT DETECTED Final   Metapneumovirus NOT DETECTED NOT DETECTED Final   Rhinovirus / Enterovirus NOT DETECTED NOT DETECTED Final   Influenza A NOT DETECTED NOT DETECTED Final   Influenza B NOT DETECTED NOT DETECTED Final   Parainfluenza Virus 1 NOT DETECTED NOT DETECTED Final   Parainfluenza Virus 2 NOT DETECTED NOT DETECTED Final   Parainfluenza Virus 3 NOT DETECTED NOT DETECTED Final   Parainfluenza Virus 4 NOT DETECTED NOT DETECTED Final   Respiratory Syncytial Virus NOT DETECTED NOT DETECTED Final   Bordetella pertussis NOT DETECTED NOT DETECTED Final   Chlamydophila pneumoniae NOT DETECTED NOT DETECTED Final   Mycoplasma pneumoniae NOT DETECTED NOT DETECTED Final    Comment: Performed at [facility], [address], [place], [place] [address]     Labs: BNP (last 3 results) Recent Labs    01/30/18 1618 03/03/18 1201 06/14/18 1542  BNP 117.0* 73.0 90.0   Basic Metabolic  Panel: Recent Labs  Lab 06/14/18 1542 06/14/18 2023 06/15/18 0333  NA 141  --  140  K 3.6  --  3.6  CL 90*  --  91*  CO2 42*  --  40*  GLUCOSE 110*  --  243*  BUN 12  --  11  CREATININE 0.82 0.85 0.82  CALCIUM 9.0  --  8.6*  MG  --   --  1.8  PHOS  --   --  2.9   Liver Function Tests: Recent Labs  Lab 06/14/18 1542  AST 13*  ALT 8  ALKPHOS 48  BILITOT 0.4  PROT 6.9  ALBUMIN 3.4*   No results for input(s): LIPASE, AMYLASE in the last 168 hours. No results for input(s): AMMONIA in the last 168 hours. CBC: Recent Labs  Lab 06/14/18 1542 06/14/18 2023 06/15/18 0333  WBC 9.6 10.3 7.6  NEUTROABS 7.4  --   --   HGB 10.8* 10.1* 9.6*  HCT 38.2 35.5* 33.9*  MCV 94.8 95.4 93.1  PLT 188 180 170   Cardiac Enzymes: Recent Labs  Lab 06/14/18 1542  TROPONINI <0.03   BNP: Invalid input(s): POCBNP CBG: Recent Labs  Lab 06/16/18 1203 06/16/18 1625 06/16/18 2127 06/17/18 0725 06/17/18 1129  GLUCAP 349* 259* 259* 134* 209*   D-Dimer Recent Labs    06/14/18 2023  DDIMER 0.66*   Hgb A1c No results for input(s): HGBA1C in the last 72 hours. Lipid Profile No results for input(s): CHOL, HDL, LDLCALC, TRIG, CHOLHDL, LDLDIRECT in the last 72 hours. Thyroid function studies No results for input(s): TSH, T4TOTAL, T3FREE, THYROIDAB in the last 72 hours.  Invalid input(s): FREET3 Anemia work up No results for input(s): VITAMINB12, FOLATE, FERRITIN, TIBC, IRON, RETICCTPCT in the last 72 hours. Urinalysis    Component Value Date/Time   COLORURINE YELLOW 06/14/2018 1937   APPEARANCEUR HAZY (A) 06/14/2018 1937   APPEARANCEUR Clear 07/24/2013 1152   LABSPEC 1.013 06/14/2018 1937   LABSPEC 1.010 07/24/2013 1152  PHURINE 5.0 06/14/2018 1937   GLUCOSEU NEGATIVE 06/14/2018 1937   GLUCOSEU Negative 07/24/2013 1152   HGBUR SMALL (A) 06/14/2018 1937   BILIRUBINUR NEGATIVE 06/14/2018 1937   BILIRUBINUR Negative 07/24/2013 1152   KETONESUR NEGATIVE 06/14/2018 1937    PROTEINUR 100 (A) 06/14/2018 1937   NITRITE NEGATIVE 06/14/2018 1937   LEUKOCYTESUR NEGATIVE 06/14/2018 1937   LEUKOCYTESUR Negative 07/24/2013 1152   Sepsis Labs Invalid input(s): PROCALCITONIN,  WBC,  LACTICIDVEN Microbiology Recent Results (from the past 240 hour(s))  Blood Culture (routine x 2)     Status: None (Preliminary result)   Collection Time: 06/14/18  3:43 PM  Result Value Ref Range Status   Specimen Description BLOOD LEFT ANTECUBITAL  Final   Special Requests   Final    BOTTLES DRAWN AEROBIC AND ANAEROBIC Blood Culture results may not be optimal due to an excessive volume of blood received in culture bottles   Culture   Final    NO GROWTH 3 DAYS Performed at [facility], [address], [place], [place] [address]    Report Status PENDING  Incomplete  Blood Culture (routine x 2)     Status: None (Preliminary result)   Collection Time: 06/14/18  3:43 PM  Result Value Ref Range Status   Specimen Description BLOOD BLOOD LEFT FOREARM  Final   Special Requests   Final    BOTTLES DRAWN AEROBIC AND ANAEROBIC Blood Culture results may not be optimal due to an excessive volume of blood received in culture bottles   Culture   Final    NO GROWTH 3 DAYS Performed at [facility], [address] [place], [place] [address]    Report Status PENDING  Incomplete  MRSA PCR Screening     Status: None   Collection Time: 06/15/18  2:15 AM  Result Value Ref Range Status   MRSA by PCR NEGATIVE NEGATIVE Final    Comment:        The GeneXpert MRSA Assay (FDA approved for NASAL specimens only), is one component of a comprehensive MRSA colonization surveillance program. It is not intended to diagnose MRSA infection nor to guide or monitor treatment for MRSA infections. Performed at [facility], [address], [place], [place] [address]   Respiratory Panel by PCR     Status: None   Collection Time: 06/15/18 12:32 PM  Result Value Ref Range Status    Adenovirus NOT DETECTED NOT DETECTED Final   Coronavirus 229E NOT DETECTED NOT DETECTED Final   Coronavirus HKU1 NOT DETECTED NOT DETECTED Final   Coronavirus NL63 NOT DETECTED NOT DETECTED Final   Coronavirus OC43 NOT DETECTED NOT DETECTED Final   Metapneumovirus NOT DETECTED NOT DETECTED Final   Rhinovirus / Enterovirus NOT DETECTED NOT DETECTED Final   Influenza A NOT DETECTED NOT DETECTED Final   Influenza B NOT DETECTED NOT DETECTED Final   Parainfluenza Virus 1 NOT DETECTED NOT DETECTED Final   Parainfluenza Virus 2 NOT DETECTED NOT DETECTED Final   Parainfluenza Virus 3 NOT DETECTED NOT DETECTED Final   Parainfluenza Virus 4 NOT DETECTED NOT DETECTED Final   Respiratory Syncytial Virus NOT DETECTED NOT DETECTED Final   Bordetella pertussis NOT DETECTED NOT DETECTED Final   Chlamydophila pneumoniae NOT DETECTED NOT DETECTED Final   Mycoplasma pneumoniae NOT DETECTED NOT DETECTED Final    Comment: Performed at [facility], [address] [place], [place] [address]    SIGNED:   Jacquelin Hawking, MD [facility] 06/17/2018, 4:21 PM

## 2018-06-17 NOTE — Care Management Note (Signed)
Case Management Note  Patient Details  Name: Sheila ModenaSarah F Ellen MRN: [id number] Date of Birth: [birth year]  Subjective/Objective:                    Action/Plan: [facility] from [facility]. CM was consulted for wheelchair for patient at the facility. CM called Jermaine with [facility] and he will have it delivered to the room. CM updated CSW that wheelchair would be delivered to the patient. CSW working on getting patient to the facility.   Expected Discharge Date:                  Expected Discharge Plan:  Assisted Living / Rest Home  In-House Referral:  Clinical Social Work  Discharge planning Services  CM Consult  Post Acute Care Choice:  Durable Medical Equipment Choice offered to:  Patient  DME Arranged:  Government social research officerWheelchair manual DME Agency:  [facility]  HH Arranged:    HH Agency:     Status of Service:  Completed, signed off  If discussed at [employer] Meetings, dates discussed:    Additional Comments:  Kermit BaloKelli F Devine Dant, RN 06/17/2018, 4:28 PM

## 2018-06-17 NOTE — Plan of Care (Signed)

## 2018-06-17 NOTE — Progress Notes (Signed)
    Durable Medical Equipment  (From admission, onward)         Start     Ordered   06/17/18 1600  For home use only DME standard manual wheelchair with seat cushion  Once    Comments:  Patient suffers from COPD, chronic respiratory failure which impairs their ability to perform daily activities like bathing, dressing, grooming and toileting in the home.  A cane, crutch or walker will not resolve  issue with performing activities of daily living. A wheelchair will allow patient to safely perform daily activities. Patient can safely propel the wheelchair in the home or has a caregiver who can provide assistance.  Accessories: elevating leg rests (ELRs), wheel locks, extensions and anti-tippers.   06/17/18 1600

## 2018-06-17 NOTE — Discharge Instructions (Signed)

## 2018-06-17 NOTE — Plan of Care (Signed)

## 2018-06-17 NOTE — Evaluation (Signed)
Physical Therapy Evaluation Patient Details Name: Sheila ModenaSarah F Dizdarevic MRN: [id number] DOB: Nov 21, 1940 Today's Date: 06/17/2018   History of Present Illness  Sheila Hayes is a 77 y.o. female with a history of chronic respiratory failure, chronic diastolic heart failure, end stage COPD, hyperlipidemia, hypothyroidism, hypertension. She presented secondary to hypoxia secondary to COPD exacerbation in setting of emesis episode and possible aspiration.    Clinical Impression  Pt admitted with above diagnosis. Pt currently with functional limitations due to the deficits listed below (see PT Problem List). PTA, pt reports living at ALF, receiving assistance with bathing and cooking, ambulating with RW. Per chart pt has history of falls but reports to PT that she has not fallen in 2 years when asked. Today, pt ambulating very short distances before fatigue and requiring to sit, supervision level for OOB. Pt agreeable to work with HHPT and utilize a w/c for OOB until she increases her tolerance balance and safety.  Pt will benefit from skilled PT to increase their independence and safety with mobility to allow discharge to the venue listed below.       Follow Up Recommendations Home health PT;Supervision for mobility/OOB (HHPT AT ALF) (w/c level for OOB mobility if unsupervised)     Equipment Recommendations  None recommended by PT    Recommendations for Other Services OT consult     Precautions / Restrictions Precautions Precautions: Fall Restrictions Weight Bearing Restrictions: No      Mobility  Bed Mobility Overal bed mobility: Modified Independent                Transfers Overall transfer level: Needs assistance Equipment used: Rolling walker (2 wheeled) Transfers: Sit to/from Stand Sit to Stand: Supervision         General transfer comment: standing with RW without overt LOB or external spport. close stand by assist  Ambulation/Gait Ambulation/Gait assistance:  Supervision Gait Distance (Feet): 20 Feet Assistive device: Rolling walker (2 wheeled) Gait Pattern/deviations: Step-to pattern;Step-through pattern Gait velocity: decreased   General Gait Details: pt ambulating with RW, no overt LOB, DOE 2/4 with short distance ambulation, SpO2 90% with activity on 3L. pt reports her legs "give out" when she gets tired, urgently sat back onto bed.   Stairs            Wheelchair Mobility    Modified Rankin (Stroke Patients Only)       Balance Overall balance assessment: Needs assistance   Sitting balance-[name] Scale: Good       Standing balance-[name] Scale: Fair                               Pertinent Vitals/Pain Pain Assessment: No/denies pain    Home Living Family/patient expects to be discharged to:: Assisted living               Home Equipment: Dan HumphreysWalker - 4 wheels Additional Comments: [facility]    Prior Function Level of Independence: Needs assistance   Gait / Transfers Assistance Needed: reports using rolling walker, denies falls in 2 years.            Hand Dominance   Dominant Hand: Right    Extremity/Trunk Assessment        Lower Extremity Assessment Lower Extremity Assessment: Overall WFL for tasks assessed(BLE strength gross 4-/5)       Communication   Communication: No difficulties  Cognition Arousal/Alertness: Awake/alert Behavior During Therapy: WFL for tasks assessed/performed  General Comments      Exercises     Assessment/Plan    PT Assessment Patient needs continued PT services  PT Problem List Decreased strength       PT Treatment Interventions DME instruction;Gait training;Stair training;Functional mobility training;Therapeutic activities;Therapeutic exercise    PT Goals (Current goals can be found in the Care Plan section)  Acute Rehab PT Goals Patient Stated Goal: get out of here and go hoem PT Goal  Formulation: With patient Time For Goal Achievement: 07/01/18 Potential to Achieve Goals: Good    Frequency Min 3X/week   Barriers to discharge        Co-evaluation               AM-PAC PT "6 Clicks" Mobility  Outcome Measure Help needed turning from your back to your side while in a flat bed without using bedrails?: None Help needed moving from lying on your back to sitting on the side of a flat bed without using bedrails?: None Help needed moving to and from a bed to a chair (including a wheelchair)?: A Little Help needed standing up from a chair using your arms (e.g., wheelchair or bedside chair)?: A Little Help needed to walk in hospital room?: A Little Help needed climbing 3-5 steps with a railing? : A Lot 6 Click Score: 19    End of Session Equipment Utilized During Treatment: Gait belt Activity Tolerance: Patient tolerated treatment well Patient left: in bed Nurse Communication: Mobility status PT Visit Diagnosis: Unsteadiness on feet (R26.81)    Time: 1610-96041300-1325 PT Time Calculation (min) (ACUTE ONLY): 25 min   Charges:   PT Evaluation $PT Eval Low Complexity: 1 Low PT Treatments $Gait Training: 8-22 mins       Etta GrandchildSean Edsel Shives, PT, DPT Acute Rehabilitation Services Pager: [phone] Office: [phone]    Etta GrandchildSean Serenidy Waltz 06/17/2018, 2:02 PM

## 2018-06-17 NOTE — Progress Notes (Signed)
Occupational Therapy Evaluation Patient Details Name: Sheila Hayes MRN: [id number] DOB: 10/07/1940 Today's Date: 06/17/2018    History of Present Illness Sheila Hayes is a 77 y.o. female with a history of chronic respiratory failure, chronic diastolic heart failure, end stage COPD, hyperlipidemia, hypothyroidism, hypertension. She presented secondary to hypoxia secondary to COPD exacerbation in setting of emesis episode and possible aspiration.   Clinical Impression   Pt presents to OT with the below listed deficits.  She is able to perform ADLs with min A, but fatigues quickly and requires multiple rest breaks.  [facility], and per her report, required intermittent assist with ADLs.  She does have to go dining area.  At this time, do not feel she can safely ambulate to dining area.  She desperately wants to discharge back to ALF, and is agreeable to use of w/c until [facility] clear her for safe ambulation.   [facility] needs, and pt able to use w/c, then recommend discharge to ALF with follow up HHOT and HHPT. All further OT can be addressed by HHOT.      Follow Up Recommendations  Home health OT;Supervision/Assistance - 24 hour    Equipment Recommendations  Wheelchair (measurements OT)    Recommendations for Other Services       Precautions / Restrictions Precautions Precautions: Fall Restrictions Weight Bearing Restrictions: No      Mobility Bed Mobility Overal bed mobility: Modified Independent             General bed mobility comments: Pt with c/o "swimming" with return to supine.  She then acknowledges dizziness with head turns - PT notified   Transfers Overall transfer level: Needs assistance Equipment used: Rolling walker (2 wheeled) Transfers: Sit to/from UGI CorporationStand;Stand Pivot Transfers Sit to Stand: Supervision Stand pivot transfers: Min guard       General transfer comment: standing with RW without overt  LOB or external spport. close stand by assist    Balance Overall balance assessment: Needs assistance   Sitting balance-[name] Scale: Good     Standing balance support: Single extremity supported;During functional activity Standing balance-[name] Scale: Poor Standing balance comment: reliant on UE support                            ADL either performed or assessed with clinical judgement   ADL Overall ADL's : Needs assistance/impaired Eating/Feeding: Independent   Grooming: Wash/dry face;Wash/dry hands;Oral care;Min guard;Standing;Sitting   Upper Body Bathing: Set up;Supervision/ safety;Sitting   Lower Body Bathing: Minimal assistance;Sit to/from stand   Upper Body Dressing : Supervision/safety;Set up;Sitting   Lower Body Dressing: Min guard;Sit to/from stand Lower Body Dressing Details (indicate cue type and reason): able to don/doff socks, requires rest breaks  Toilet Transfer: Min guard;Ambulation;Comfort height toilet;RW;Grab bars   Toileting- Clothing Manipulation and Hygiene: Min guard;Sit to/from stand       Functional mobility during ADLs: Hydrographic surveyorMin guard;Rolling walker       Vision         Perception     Praxis      Pertinent Vitals/Pain Pain Assessment: No/denies pain     Hand Dominance Right   Extremity/Trunk Assessment Upper Extremity Assessment Upper Extremity Assessment: Generalized weakness   Lower Extremity Assessment Lower Extremity Assessment: Defer to PT evaluation       Communication Communication Communication: No difficulties   Cognition Arousal/Alertness: Awake/alert Behavior During Therapy: [facility] for tasks  assessed/performed Overall Cognitive Status: No family/caregiver present to determine baseline cognitive functioning                                 General Comments: Pt demonstrates decreased safety awareness    General Comments  Pt became very irritated with OT as she fatigued.   She acknowledged she is  fearful we will tell her she can't discharge to ALF.  Discussed current deficits with her, and concern for falls due to fatigue.  She indicates that she could use w/c at ALF, and is agreeable to this until [facility] for ambulation     Exercises     Shoulder Instructions      Home Living Family/patient expects to be discharged to:: Assisted living                             Home Equipment: Dan HumphreysWalker - 4 wheels   Additional Comments: [facility]      Prior Functioning/Environment Level of Independence: Needs assistance  Gait / Transfers Assistance Needed: reports using rolling walker, denies falls in 2 years.  ADL's / Homemaking Assistance Needed: Pt reports she requires assist for bathing back and feet.  She reports some days she can don socks, but uses sock aid when she can't.     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[facility]) OT Goal Formulation: All assessment and education complete, DC therapy  OT Frequency:     Barriers to D/C:            Co-evaluation              AM-PAC OT "6 Clicks" Daily Activity     Outcome Measure Help from another person eating meals?: None Help from another person taking care of personal grooming?: A Little Help from another person toileting, which includes using toliet, bedpan, or urinal?: A Little Help from another person bathing (including washing, rinsing, drying)?: A Little Help from another person to put on and taking off regular upper body  clothing?: A Little Help from another person to put on and taking off regular lower body clothing?: A Little 6 Click Score: 19   End of Session Equipment Utilized During Treatment: Rolling walker;Gait belt;Oxygen Nurse Communication: Mobility status  Activity Tolerance: Patient limited by fatigue Patient left: in bed;with call bell/phone within reach;with bed alarm set  OT Visit Diagnosis: Unsteadiness on feet (R26.81)                Time: [id number] OT Time Calculation (min): 20 min Charges:  OT General Charges $OT Visit: 1 Visit OT Evaluation $OT Eval Moderate Complexity: 1 Mod  Jeani HawkingWendi Xitlalic Maslin, OTR/L Acute Rehabilitation Services Pager [phone] Office [phone]   Jeani HawkingConarpe, Britnee Mcdevitt M 06/17/2018, 2:22 PM

## 2018-06-17 NOTE — Clinical Social Work Note (Signed)
Clinical Social Work Assessment  Patient Details  Name: Sheila Hayes MRN: [id number] Date of Birth: 07/04/1940  Date of referral:  06/17/18               Reason for consult:  Facility Placement                Permission sought to share information with:  Chartered certified accountant granted to share information::  Yes, Verbal Permission Granted  Name::        Agency::  [employer]  Relationship::     Contact Information:     Housing/Transportation Living arrangements for the past 2 months:  [place] of Information:  Patient, Medical Team Patient Interpreter Needed:  None Criminal Activity/Legal Involvement Pertinent to Current Situation/Hospitalization:  No - Comment as needed Significant Relationships:  Adult Children Lives with:  Self, Facility Resident Do you feel safe going back to the place where you live?  Yes Need for family participation in patient care:  No (Coment)  Care giving concerns:  Patient from [facility] and has no concerns about care received.   Social Worker assessment / plan:  CSW received call from RN and OT that patient can return to ALF with home health, as long as they can handle her with a wheelchair. RNCM ordered wheelchair to be delivered to the room. CSW met with patient to discuss her return to ALF. Patient agreeable to return, says she is happy there. CSW to contact ALF and discuss patient's return.  Employment status:  Retired Forensic scientist:  Medicare PT Recommendations:  Home with [place] / Referral to community resources:  [facility]  Patient/Family's Response to care:  Patient agreeable to return to ALF.  Patient/Family's Understanding of and Emotional Response to Diagnosis, Current Treatment, and Prognosis:  Patient discussed how she's ready to get out of the hospital and go back home. Patient appreciative of CSW assistance. Patient says she has  had home health set up for her before and is willing to participate again.  Emotional Assessment Appearance:  Appears stated age Attitude/Demeanor/Rapport:  Engaged Affect (typically observed):  Pleasant Orientation:  Oriented to Self, Oriented to Place, Oriented to  Time, Oriented to Situation Alcohol / Substance use:  Not Applicable Psych involvement (Current and /or in the community):  No (Comment)  Discharge Needs  Concerns to be addressed:  Care Coordination Readmission within the last 30 days:  No Current discharge risk:  Dependent with Mobility Barriers to Discharge:  Continued Medical Work up   [employer], [place] 06/17/2018, 5:49 PM

## 2018-06-18 LAB — GLUCOSE, CAPILLARY
Glucose-Capillary: 147 mg/dL — ABNORMAL HIGH (ref 70–99)
Glucose-Capillary: 150 mg/dL — ABNORMAL HIGH (ref 70–99)

## 2018-06-18 MED ORDER — AZITHROMYCIN 250 MG PO TABS: 500.0000 mg | ORAL_TABLET | Freq: Every day | ORAL | Status: AC

## 2018-06-18 MED ORDER — IPRATROPIUM-ALBUTEROL 0.5-2.5 (3) MG/3ML IN SOLN
3.0000 mL | Freq: Three times a day (TID) | RESPIRATORY_TRACT | Status: DC
  Administered 2018-06-18 (×2): 3 mL via RESPIRATORY_TRACT
  Filled 2018-06-18 (×2): qty 3

## 2018-06-18 MED ORDER — PREDNISONE 10 MG PO TABS: ORAL_TABLET | ORAL | 0 refills | Status: AC

## 2018-06-18 MED ORDER — PREDNISONE 10 MG PO TABS: ORAL_TABLET | ORAL | Status: DC

## 2018-06-18 NOTE — Progress Notes (Addendum)
Patient will DC to: Anticipated DC date: 06/18/2018 Family notified: Yes Transport by: Sharin MonsPTAR   Per MD patient ready for DC to . RN, patient, patient's family, and facility notified of DC. Discharge Summary and FL2 sent to facility. RN to call report prior to discharge [phone]) and will be going to room 12. DC packet on chart. Ambulance transport requested for patient.   CSW will sign off for now as social work intervention is no longer needed. Please consult us again if new needs arise.  Macallan Ord, LCSW-A [place]/Clinical Social Work Department Cell: [phone]

## 2018-06-18 NOTE — Progress Notes (Signed)
Patient seen and examined at bedside. Patient discharged on 12/27, however, ALF can only accept today. Still stable for discharge.   Jacquelin Hawkingalph Cynitha Berte, MD [facility] 06/18/2018, 12:43 PM

## 2018-06-18 NOTE — NC FL2 (Signed)
[place] MEDICAID FL2 LEVEL OF CARE SCREENING TOOL     IDENTIFICATION  Patient Name: Sheila Hayes Birthdate: [birth year] Sex: female Admission Date (Current Location): 06/14/2018  [name] [place]   Fac[facility] and Address:  [facility] [address], [place], [place] [address]      Provider Number: [id number]  Attending Physician Name and Address:  Narda BondsNettey, Ralph A, MD  Relative Name and Phone Number:       Current Level of Care: Hospital Recommended Level of Care: Assisted Living Facility Prior Approval Number: [id number] O  Date Approved/Denied: 10/30/16 PASRR Number:    Discharge Plan: Other (Comment)(Assisted living facility)    Current Diagnoses: Patient Active Problem List   Diagnosis Date Noted  . Acute respiratory failure with hypercapnia (HCC) 06/14/2018  . Acute on chronic diastolic CHF (congestive heart failure) (HCC) 02/23/2018  . Altered mental status   . Urinary tract infection without hematuria   . Acute respiratory failure with hypoxia and hypercapnia (HCC) 01/30/2018  . Near syncope 10/09/2017  . HTN (hypertension) 10/09/2017  . Diabetes (HCC) 10/09/2017  . HLD (hyperlipidemia) 10/09/2017  . Chronic diastolic CHF (congestive heart failure) (HCC) 10/09/2017  . Hypothyroidism 10/09/2017  . COPD exacerbation (HCC) 05/13/2016    Orientation RESPIRATION BLADDER Height & Weight     Self, Time  O2(98, nasal cannula, flow rate 3) Continent(has urinary catheter) Weight: 231 lb 11.3 oz (105.1 kg) Height:     BEHAVIORAL SYMPTOMS/MOOD NEUROLOGICAL BOWEL NUTRITION STATUS      Continent Diet(Heart Healthy )  AMBULATORY STATUS COMMUNICATION OF NEEDS Skin   Limited Assist   Normal                       Personal Care Assistance Level of Assistance  Bathing, Feeding, Total care, Dressing Bathing Assistance: Limited assistance Feeding assistance: Limited assistance Dressing Assistance: Limited  assistance Total Care Assistance: Limited assistance   Functional Limitations Info  Sight, Hearing, Speech Sight Info: Adequate Hearing Info: Adequate Speech Info: Adequate    SPECIAL CARE FACTORS FREQUENCY  PT (By licensed PT), OT (By licensed OT)     PT Frequency: 3x/wk OT Frequency: 3x/wk            Contractures Contractures Info: Not present    Additional Factors Info  Code Status, Allergies Code Status Info: Partial Code Allergies Info: No known allergies           Current Medications (06/18/2018):  This is the current hospital active medication list Current Facility-Administered Medications  Medication Dose Route Frequency Provider Last Rate Last Dose  . azithromycin (ZITHROMAX) tablet 500 mg  500 mg Oral QPM Ilda BassetDurham, Jennifer D, RPH   500 mg at 06/17/18 1751  . budesonide (PULMICORT) nebulizer solution 0.5 mg  0.5 mg Nebulization BID Ileana RoupIsmail, Ahmad M, MD   0.5 mg at 06/18/18 [id number]  . citalopram (CELEXA) tablet 40 mg  40 mg Oral Daily Narda BondsNettey, Ralph A, MD   40 mg at 06/18/18 1009  . furosemide (LASIX) tablet 20 mg  20 mg Oral Daily Narda BondsNettey, Ralph A, MD   20 mg at 06/18/18 1009  . heparin injection 5,000 Units  5,000 Units Subcutaneous Q8H Ileana RoupIsmail, Ahmad M, MD   5,000 Units at 06/18/18 [phone]  . insulin aspart (novoLOG) injection 0-15 Units  0-15 Units Subcutaneous TID WC Narda BondsNettey, Ralph A, MD   2 Units at 06/18/18 1223  . ipratropium-albuterol (DUONEB) 0.5-2.5 (3) MG/3ML nebulizer solution 3  mL  3 mL Nebulization TID Narda BondsNettey, Ralph A, MD   3 mL at 06/18/18 0721  . levothyroxine (SYNTHROID, LEVOTHROID) tablet 125 mcg  125 mcg Oral Q0600 Selmer DominionSimpson, Paula B, NP   125 mcg at 06/18/18 [phone]  . MEDLINE mouth rinse  15 mL Mouth Rinse BID Narda BondsNettey, Ralph A, MD   15 mL at 06/18/18 1000  . methylPREDNISolone sodium succinate (SOLU-MEDROL) 40 mg/mL injection 40 mg  40 mg Intravenous Daily Narda BondsNettey, Ralph A, MD   40 mg at 06/18/18 1003  . metoprolol tartrate (LOPRESSOR) tablet 12.5 mg  12.5 mg  Oral BID Narda BondsNettey, Ralph A, MD   Stopped at 06/18/18 1001  . nystatin (MYCOSTATIN/NYSTOP) topical powder   Topical TID PRN Selmer DominionSimpson, Paula B, NP      . pantoprazole (PROTONIX) EC tablet 40 mg  40 mg Oral Q supper Ilda BassetDurham, Jennifer D, [place]   40 mg at 06/17/18 1751  . rosuvastatin (CRESTOR) tablet 40 mg  40 mg Oral QPM Narda BondsNettey, Ralph A, MD   40 mg at 06/17/18 1751     Discharge Medications: Please see discharge summary for a list of discharge medications.  Relevant Imaging Results:  Relevant Lab Results:   Additional Information SSN: [id number]  Nada BoozerCaitlin B Bhavya Grand, LCSWA

## 2018-06-19 LAB — CULTURE, BLOOD (ROUTINE X 2)
Culture: NO GROWTH
Culture: NO GROWTH

## 2018-09-01 ENCOUNTER — Emergency Department

## 2018-09-01 ENCOUNTER — Inpatient Hospital Stay
Admission: EM | Admit: 2018-09-01 | Discharge: 2018-09-04 | DRG: 689 | Disposition: A | Discharge status: Hospice | Source: Skilled Nursing Facility | Attending: Specialist | Admitting: Specialist

## 2018-09-01 ENCOUNTER — Other Ambulatory Visit: Payer: Self-pay

## 2018-09-01 ENCOUNTER — Encounter: Payer: Self-pay | Admitting: Emergency Medicine

## 2018-09-01 DIAGNOSIS — W19XXXA Unspecified fall, initial encounter: Secondary | ICD-10-CM

## 2018-09-01 DIAGNOSIS — R296 Repeated falls: Secondary | ICD-10-CM | POA: Diagnosis present

## 2018-09-01 DIAGNOSIS — R062 Wheezing: Secondary | ICD-10-CM

## 2018-09-01 DIAGNOSIS — B9562 Methicillin resistant Staphylococcus aureus infection as the cause of diseases classified elsewhere: Secondary | ICD-10-CM | POA: Diagnosis present

## 2018-09-01 DIAGNOSIS — Z87440 Personal history of urinary (tract) infections: Secondary | ICD-10-CM

## 2018-09-01 DIAGNOSIS — J9801 Acute bronchospasm: Secondary | ICD-10-CM | POA: Diagnosis not present

## 2018-09-01 DIAGNOSIS — K219 Gastro-esophageal reflux disease without esophagitis: Secondary | ICD-10-CM | POA: Diagnosis present

## 2018-09-01 DIAGNOSIS — R4182 Altered mental status, unspecified: Secondary | ICD-10-CM | POA: Diagnosis present

## 2018-09-01 DIAGNOSIS — I5032 Chronic diastolic (congestive) heart failure: Secondary | ICD-10-CM | POA: Diagnosis present

## 2018-09-01 DIAGNOSIS — I110 Hypertensive heart disease with heart failure: Secondary | ICD-10-CM | POA: Diagnosis present

## 2018-09-01 DIAGNOSIS — Z800 Family history of malignant neoplasm of digestive organs: Secondary | ICD-10-CM

## 2018-09-01 DIAGNOSIS — E119 Type 2 diabetes mellitus without complications: Secondary | ICD-10-CM | POA: Diagnosis present

## 2018-09-01 DIAGNOSIS — Z833 Family history of diabetes mellitus: Secondary | ICD-10-CM

## 2018-09-01 DIAGNOSIS — Z801 Family history of malignant neoplasm of trachea, bronchus and lung: Secondary | ICD-10-CM

## 2018-09-01 DIAGNOSIS — Z6836 Body mass index (BMI) 36.0-36.9, adult: Secondary | ICD-10-CM

## 2018-09-01 DIAGNOSIS — Z7951 Long term (current) use of inhaled steroids: Secondary | ICD-10-CM

## 2018-09-01 DIAGNOSIS — F329 Major depressive disorder, single episode, unspecified: Secondary | ICD-10-CM | POA: Diagnosis present

## 2018-09-01 DIAGNOSIS — Z7984 Long term (current) use of oral hypoglycemic drugs: Secondary | ICD-10-CM

## 2018-09-01 DIAGNOSIS — E876 Hypokalemia: Secondary | ICD-10-CM | POA: Diagnosis present

## 2018-09-01 DIAGNOSIS — R7881 Bacteremia: Secondary | ICD-10-CM | POA: Diagnosis present

## 2018-09-01 DIAGNOSIS — Z515 Encounter for palliative care: Secondary | ICD-10-CM | POA: Diagnosis present

## 2018-09-01 DIAGNOSIS — Z79899 Other long term (current) drug therapy: Secondary | ICD-10-CM

## 2018-09-01 DIAGNOSIS — E039 Hypothyroidism, unspecified: Secondary | ICD-10-CM | POA: Diagnosis present

## 2018-09-01 DIAGNOSIS — Z79890 Hormone replacement therapy (postmenopausal): Secondary | ICD-10-CM

## 2018-09-01 DIAGNOSIS — J441 Chronic obstructive pulmonary disease with (acute) exacerbation: Secondary | ICD-10-CM | POA: Diagnosis not present

## 2018-09-01 DIAGNOSIS — Z8249 Family history of ischemic heart disease and other diseases of the circulatory system: Secondary | ICD-10-CM

## 2018-09-01 DIAGNOSIS — N390 Urinary tract infection, site not specified: Secondary | ICD-10-CM | POA: Diagnosis not present

## 2018-09-01 DIAGNOSIS — E785 Hyperlipidemia, unspecified: Secondary | ICD-10-CM | POA: Diagnosis present

## 2018-09-01 DIAGNOSIS — I951 Orthostatic hypotension: Secondary | ICD-10-CM | POA: Diagnosis present

## 2018-09-01 DIAGNOSIS — Z66 Do not resuscitate: Secondary | ICD-10-CM | POA: Diagnosis present

## 2018-09-01 DIAGNOSIS — I272 Pulmonary hypertension, unspecified: Secondary | ICD-10-CM | POA: Diagnosis present

## 2018-09-01 DIAGNOSIS — Z87891 Personal history of nicotine dependence: Secondary | ICD-10-CM

## 2018-09-01 DIAGNOSIS — Z9049 Acquired absence of other specified parts of digestive tract: Secondary | ICD-10-CM

## 2018-09-01 DIAGNOSIS — G9341 Metabolic encephalopathy: Secondary | ICD-10-CM | POA: Diagnosis present

## 2018-09-01 LAB — URINALYSIS, COMPLETE (UACMP) WITH MICROSCOPIC
Bacteria, UA: NONE SEEN
Bilirubin Urine: NEGATIVE
Glucose, UA: NEGATIVE mg/dL
Ketones, ur: NEGATIVE mg/dL
Leukocytes,Ua: NEGATIVE
Nitrite: NEGATIVE
PH: 5 (ref 5.0–8.0)
Protein, ur: 100 mg/dL — AB
Specific Gravity, Urine: 1.017 (ref 1.005–1.030)

## 2018-09-01 LAB — CBC WITH DIFFERENTIAL/PLATELET
Abs Immature Granulocytes: 0.05 10*3/uL (ref 0.00–0.07)
Basophils Absolute: 0.1 10*3/uL (ref 0.0–0.1)
Basophils Relative: 1 %
Eosinophils Absolute: 0 10*3/uL (ref 0.0–0.5)
Eosinophils Relative: 0 %
HCT: 35.4 % — ABNORMAL LOW (ref 36.0–46.0)
HEMOGLOBIN: 9.9 g/dL — AB (ref 12.0–15.0)
Immature Granulocytes: 1 %
Lymphocytes Relative: 13 %
Lymphs Abs: 1 10*3/uL (ref 0.7–4.0)
MCH: 26.1 pg (ref 26.0–34.0)
MCHC: 28 g/dL — AB (ref 30.0–36.0)
MCV: 93.2 fL (ref 80.0–100.0)
Monocytes Absolute: 0.6 10*3/uL (ref 0.1–1.0)
Monocytes Relative: 8 %
Neutro Abs: 5.9 10*3/uL (ref 1.7–7.7)
Neutrophils Relative %: 77 %
Platelets: 211 10*3/uL (ref 150–400)
RBC: 3.8 MIL/uL — ABNORMAL LOW (ref 3.87–5.11)
RDW: 14.6 % (ref 11.5–15.5)
WBC: 7.7 10*3/uL (ref 4.0–10.5)
nRBC: 0 % (ref 0.0–0.2)

## 2018-09-01 LAB — COMPREHENSIVE METABOLIC PANEL
ALT: 8 U/L (ref 0–44)
AST: 17 U/L (ref 15–41)
Albumin: 3.3 g/dL — ABNORMAL LOW (ref 3.5–5.0)
Alkaline Phosphatase: 39 U/L (ref 38–126)
Anion gap: 9 (ref 5–15)
BUN: 13 mg/dL (ref 8–23)
CO2: 39 mmol/L — ABNORMAL HIGH (ref 22–32)
Calcium: 8.7 mg/dL — ABNORMAL LOW (ref 8.9–10.3)
Chloride: 89 mmol/L — ABNORMAL LOW (ref 98–111)
Creatinine, Ser: 0.7 mg/dL (ref 0.44–1.00)
GFR calc non Af Amer: >60 mL/min (ref >60)
Glucose, Bld: 191 mg/dL — ABNORMAL HIGH (ref 70–99)
Potassium: 3.9 mmol/L (ref 3.5–5.1)
Sodium: 137 mmol/L (ref 135–145)
TOTAL PROTEIN: 6.8 g/dL (ref 6.5–8.1)
Total Bilirubin: 1.2 mg/dL (ref 0.3–1.2)

## 2018-09-01 LAB — INFLUENZA PANEL BY PCR (TYPE A & B)
INFLBPCR: NEGATIVE
Influenza A By PCR: NEGATIVE

## 2018-09-01 LAB — LACTIC ACID, PLASMA: Lactic Acid, Venous: 1.1 mmol/L (ref 0.5–1.9)

## 2018-09-01 LAB — GLUCOSE, CAPILLARY
Glucose-Capillary: 128 mg/dL — ABNORMAL HIGH (ref 70–99)
Glucose-Capillary: 113 mg/dL — ABNORMAL HIGH (ref 70–99)

## 2018-09-01 LAB — TROPONIN I: Troponin I: <0.03 ng/mL (ref <0.03)

## 2018-09-01 MED ORDER — LEVOTHYROXINE SODIUM 25 MCG PO TABS
125.0000 ug | ORAL_TABLET | Freq: Every day | ORAL | Status: DC
  Administered 2018-09-02 – 2018-09-04 (×3): 125 ug via ORAL
  Filled 2018-09-01 (×3): qty 1

## 2018-09-01 MED ORDER — ENOXAPARIN SODIUM 40 MG/0.4ML ~~LOC~~ SOLN
40.0000 mg | SUBCUTANEOUS | Status: DC
  Administered 2018-09-01 – 2018-09-03 (×3): 40 mg via SUBCUTANEOUS
  Filled 2018-09-01 (×3): qty 0.4

## 2018-09-01 MED ORDER — SODIUM CHLORIDE 0.9 % IV SOLN
1.0000 g | Freq: Once | INTRAVENOUS | Status: AC
  Administered 2018-09-01: 1 g via INTRAVENOUS
  Filled 2018-09-01: qty 10

## 2018-09-01 MED ORDER — CITALOPRAM HYDROBROMIDE 20 MG PO TABS
40.0000 mg | ORAL_TABLET | Freq: Every day | ORAL | Status: DC
  Administered 2018-09-01 – 2018-09-02 (×2): 40 mg via ORAL
  Filled 2018-09-01 (×3): qty 2

## 2018-09-01 MED ORDER — ALBUTEROL SULFATE (2.5 MG/3ML) 0.083% IN NEBU
2.5000 mg | INHALATION_SOLUTION | Freq: Four times a day (QID) | RESPIRATORY_TRACT | Status: DC | PRN
  Administered 2018-09-03: 13:00:00 2.5 mg via RESPIRATORY_TRACT
  Filled 2018-09-01: qty 3

## 2018-09-01 MED ORDER — ROSUVASTATIN CALCIUM 20 MG PO TABS
40.0000 mg | ORAL_TABLET | Freq: Every evening | ORAL | Status: DC
  Administered 2018-09-01 – 2018-09-03 (×3): 40 mg via ORAL
  Filled 2018-09-01 (×3): qty 2

## 2018-09-01 MED ORDER — ACETAMINOPHEN 650 MG RE SUPP: 650.0000 mg | Freq: Four times a day (QID) | RECTAL | Status: DC | PRN

## 2018-09-01 MED ORDER — GABAPENTIN 100 MG PO CAPS
200.0000 mg | ORAL_CAPSULE | Freq: Two times a day (BID) | ORAL | Status: DC
  Administered 2018-09-01 – 2018-09-04 (×6): 200 mg via ORAL
  Filled 2018-09-01 (×6): qty 2

## 2018-09-01 MED ORDER — PANTOPRAZOLE SODIUM 40 MG PO TBEC
40.0000 mg | DELAYED_RELEASE_TABLET | Freq: Every day | ORAL | Status: DC
  Administered 2018-09-01 – 2018-09-04 (×4): 40 mg via ORAL
  Filled 2018-09-01 (×4): qty 1

## 2018-09-01 MED ORDER — SODIUM CHLORIDE 0.9 % IV SOLN
1.0000 g | INTRAVENOUS | Status: DC
  Administered 2018-09-02 – 2018-09-04 (×3): 1 g via INTRAVENOUS
  Filled 2018-09-01: qty 10
  Filled 2018-09-01 (×3): qty 1

## 2018-09-01 MED ORDER — TIOTROPIUM BROMIDE MONOHYDRATE 18 MCG IN CAPS
18.0000 ug | ORAL_CAPSULE | Freq: Every day | RESPIRATORY_TRACT | Status: DC
  Administered 2018-09-02 – 2018-09-03 (×2): 18 ug via RESPIRATORY_TRACT
  Filled 2018-09-01 (×2): qty 5

## 2018-09-01 MED ORDER — CALCIUM CARBONATE ANTACID 500 MG PO CHEW
600.0000 mg | CHEWABLE_TABLET | Freq: Two times a day (BID) | ORAL | Status: DC
  Administered 2018-09-01 – 2018-09-04 (×6): 600 mg via ORAL
  Filled 2018-09-01 (×7): qty 3

## 2018-09-01 MED ORDER — SODIUM CHLORIDE 0.9 % IV SOLN
INTRAVENOUS | Status: DC
  Administered 2018-09-01 – 2018-09-03 (×4): via INTRAVENOUS

## 2018-09-01 MED ORDER — MOMETASONE FURO-FORMOTEROL FUM 100-5 MCG/ACT IN AERO
2.0000 | INHALATION_SPRAY | Freq: Two times a day (BID) | RESPIRATORY_TRACT | Status: DC
  Administered 2018-09-01 – 2018-09-03 (×4): 2 via RESPIRATORY_TRACT
  Filled 2018-09-01: qty 8.8

## 2018-09-01 MED ORDER — INSULIN ASPART 100 UNIT/ML ~~LOC~~ SOLN
0.0000 [IU] | Freq: Three times a day (TID) | SUBCUTANEOUS | Status: DC
  Administered 2018-09-01: 1 [IU] via SUBCUTANEOUS
  Administered 2018-09-02 (×2): 2 [IU] via SUBCUTANEOUS
  Administered 2018-09-03: 13:00:00 3 [IU] via SUBCUTANEOUS
  Administered 2018-09-03: 17:00:00 5 [IU] via SUBCUTANEOUS
  Administered 2018-09-03: 09:00:00 1 [IU] via SUBCUTANEOUS
  Administered 2018-09-04 (×2): 5 [IU] via SUBCUTANEOUS
  Filled 2018-09-01 (×8): qty 1

## 2018-09-01 MED ORDER — SODIUM CHLORIDE 0.9 % IV BOLUS
1000.0000 mL | Freq: Once | INTRAVENOUS | Status: AC
  Administered 2018-09-01: 1000 mL via INTRAVENOUS

## 2018-09-01 MED ORDER — METOPROLOL TARTRATE 25 MG PO TABS
12.5000 mg | ORAL_TABLET | Freq: Two times a day (BID) | ORAL | Status: DC
  Administered 2018-09-01 – 2018-09-04 (×6): 12.5 mg via ORAL
  Filled 2018-09-01 (×6): qty 1

## 2018-09-01 MED ORDER — FUROSEMIDE 20 MG PO TABS
20.0000 mg | ORAL_TABLET | Freq: Every day | ORAL | Status: DC
  Administered 2018-09-01 – 2018-09-04 (×4): 20 mg via ORAL
  Filled 2018-09-01 (×5): qty 1

## 2018-09-01 MED ORDER — POTASSIUM CHLORIDE CRYS ER 10 MEQ PO TBCR
10.0000 meq | EXTENDED_RELEASE_TABLET | Freq: Every day | ORAL | Status: DC
  Administered 2018-09-01 – 2018-09-04 (×4): 10 meq via ORAL
  Filled 2018-09-01 (×5): qty 1

## 2018-09-01 MED ORDER — IPRATROPIUM-ALBUTEROL 0.5-2.5 (3) MG/3ML IN SOLN
3.0000 mL | Freq: Four times a day (QID) | RESPIRATORY_TRACT | Status: DC | PRN
  Administered 2018-09-01: 3 mL via RESPIRATORY_TRACT
  Filled 2018-09-01: qty 3

## 2018-09-01 MED ORDER — ACETAMINOPHEN 325 MG PO TABS: 650.0000 mg | ORAL_TABLET | Freq: Four times a day (QID) | ORAL | Status: DC | PRN

## 2018-09-01 MED ORDER — ONDANSETRON HCL 4 MG PO TABS
4.0000 mg | ORAL_TABLET | Freq: Four times a day (QID) | ORAL | Status: DC | PRN
  Filled 2018-09-01: qty 1

## 2018-09-01 MED ORDER — ONDANSETRON HCL 4 MG/2ML IJ SOLN: 4.0000 mg | Freq: Four times a day (QID) | INTRAMUSCULAR | Status: DC | PRN

## 2018-09-01 MED ORDER — INSULIN ASPART 100 UNIT/ML ~~LOC~~ SOLN
0.0000 [IU] | Freq: Every day | SUBCUTANEOUS | Status: DC
  Administered 2018-09-03: 3 [IU] via SUBCUTANEOUS
  Filled 2018-09-01: qty 1

## 2018-09-01 NOTE — ED Provider Notes (Signed)
[facility] Emergency Department Provider Note   ____________________________________________   I have reviewed the triage vital signs and the nursing notes.   HISTORY  Chief Complaint Fall   History limited by and level 5 caveat due to altered mental status   HPI Sheila Hayes is a 78 y.o. female who presents to the emergency department today from [place] facility via EMS because of concerns for falls and altered mental status.  The patient apparently now has had 2 falls in the past 12 hours.  EMS states that staff did not have any particular traumatic concerns however did notice the patient was acting abnormally.  Patient is been less responsive.  Is unclear how long patient has been less responsive.  Per EMS the patient was slightly tachycardic.  Per EMS the patient is on oxygen at baseline.  Per medical record review patient has a history of COPD, DM, HLD, HTN.   Past Medical History:  Diagnosis Date  . Chronic diastolic congestive heart failure (HCC)   . COPD (chronic obstructive pulmonary disease) (HCC)   . Diabetes mellitus without complication (HCC)   . Hyperlipidemia   . Hypertension   . Hypothyroidism   . Morbid obesity (HCC)   . Pulmonary hypertension [facility])     Patient Active Problem List   Diagnosis Date Noted  . Acute respiratory failure with hypercapnia (HCC) 06/14/2018  . Acute on chronic diastolic CHF (congestive heart failure) (HCC) 02/23/2018  . Altered mental status   . Urinary tract infection without hematuria   . Acute respiratory failure with hypoxia and hypercapnia (HCC) 01/30/2018  . Near syncope 10/09/2017  . HTN (hypertension) 10/09/2017  . Diabetes (HCC) 10/09/2017  . HLD (hyperlipidemia) 10/09/2017  . Chronic diastolic CHF (congestive heart failure) (HCC) 10/09/2017  . Hypothyroidism 10/09/2017  . COPD exacerbation (HCC) 05/13/2016    Past Surgical History:  Procedure Laterality Date  . APPENDECTOMY    .  CHOLECYSTECTOMY      Prior to Admission medications   Medication Sig Start Date End Date Taking? Authorizing Provider  acetaminophen (TYLENOL) 500 MG tablet Take 500 mg by mouth every 6 (six) hours as needed for headache.    [provider]  albuterol (PROVENTIL HFA;VENTOLIN HFA) 108 (90 Base) MCG/ACT inhaler Inhale 2 puffs into the lungs every 6 (six) hours as needed for wheezing or shortness of breath.    [provider]  budesonide-formoterol (SYMBICORT) 80-4.5 MCG/ACT inhaler Inhale 2 puffs into the lungs 2 (two) times daily.    [provider]  calcium carbonate (OSCAL) 1500 (600 Ca) MG TABS tablet Take 600 mg of elemental calcium by mouth 2 (two) times daily with a meal.     [provider]  citalopram (CELEXA) 40 MG tablet Take 40 mg by mouth daily.    [provider]  docusate sodium (COLACE) 100 MG capsule Take 200 mg by mouth at bedtime.    [provider]  furosemide (LASIX) 20 MG tablet Take 20 mg by mouth daily.    [provider]  gabapentin (NEURONTIN) 100 MG capsule Take 200 mg by mouth 2 (two) times daily.    [provider]  glipiZIDE (GLUCOTROL) 10 MG tablet Take 10 mg by mouth daily before breakfast.    [provider]  levothyroxine (SYNTHROID, LEVOTHROID) 125 MCG tablet Take 125 mcg by mouth daily before breakfast.    [provider]  LORazepam (ATIVAN) 0.5 MG tablet Take 0.5 mg by mouth every 4 (four) hours  as needed for anxiety.    [provider]  metFORMIN (GLUCOPHAGE) 500 MG tablet Take 1,000 mg by mouth 2 (two) times daily with a meal.     [provider]  metoprolol tartrate (LOPRESSOR) 25 MG tablet Take 0.5 tablets (12.5 mg total) by mouth 2 (two) times daily. Patient taking differently: Take 12.5 mg by mouth 2 (two) times daily. HOLD for SBP <100 or HR <60 05/20/16   Shaune Pollack, MD  nystatin (NYSTATIN) powder Apply topically 2 (two) times daily as needed. For  fungal growth    [provider]  omeprazole (PRILOSEC) 20 MG capsule Take 20 mg by mouth daily.    [provider]  potassium chloride (K-DUR) 10 MEQ tablet Take 10 mEq by mouth daily.    [provider]  prochlorperazine (COMPAZINE) 10 MG tablet Take 10 mg by mouth every 6 (six) hours as needed for nausea or vomiting.    [provider]  rosuvastatin (CRESTOR) 40 MG tablet Take 40 mg by mouth every evening.     [provider]  tiotropium (SPIRIVA) 18 MCG inhalation capsule Place 18 mcg into inhaler and inhale daily.    [provider]    Allergies Patient has no known allergies.  Family History  Problem Relation Age of Onset  . Heart attack Mother   . Heart attack Father   . Diabetes Sister   . Hypertension Sister   . Hypertension Brother   . Diabetes Brother   . Lung cancer Brother   . Colon cancer Brother     Social History Social History   Tobacco Use  . Smoking status: Former Games developer  . Smokeless tobacco: Never Used  Substance Use Topics  . Alcohol use: No  . Drug use: No    Review of Systems Unable to obtain secondary to AMS.   ____________________________________________   PHYSICAL EXAM:  VITAL SIGNS: ED Triage Vitals  Enc Vitals Group     BP 09/01/18 0727 (!) 122/48     Pulse Rate 09/01/18 0725 (!) 109     Resp 09/01/18 0725 18     Temp --      Temp src --      SpO2 09/01/18 0727 96 %     Weight --      Height --      Head Circumference --      Peak Flow --      Pain Score 09/01/18 0725 0   Constitutional: Somnolent, awakens to verbal stimuli, not oriented Eyes: Conjunctivae are normal.  ENT      Head: Normocephalic and atraumatic.      Nose: No congestion/rhinnorhea.      Mouth/Throat: Mucous membranes are moist.      Neck: No stridor. Hematological/Lymphatic/Immunilogical: No cervical lymphadenopathy. Cardiovascular: Tachycardic, regular rhythm.  No murmurs, rubs, or gallops.   Respiratory: Normal respiratory effort without tachypnea nor retractions. No rhonchi or wheezing appreciated.  Gastrointestinal: Soft and non tender. No rebound. No guarding.  Genitourinary: Deferred Musculoskeletal: Normal range of motion in all extremities. No lower extremity edema. Neurologic:  Somnolent, awakens to verbal stimuli, not oriented. Appears to move all extremities.  Skin:  Skin is warm, dry and intact. No rash noted.  ____________________________________________    LABS (pertinent positives/negatives)  Influenza negative CBC wbc 7.7, hgb 9.9, plt 211 Trop <0.03 CMP na 137, k 3.9, glu 191, cr 0.70 Lactic 1.1 UA cloudy, 21-50 rbc, 6-10 wbc  ____________________________________________   EKG  I, Dustin Flock  Derrill Kay, attending physician, personally viewed and interpreted this EKG  EKG Time: 0731 Rate: 109 Rhythm: sinus tachycardia Axis: right axis deviation Intervals: qtc 489 QRS: RBBB ST changes: no st elevation Impression: abnormal ekg   ____________________________________________    RADIOLOGY  CT head/cervical spine No acute finding  CXR No infiltrate  ____________________________________________   PROCEDURES  Procedures  ____________________________________________   INITIAL IMPRESSION / ASSESSMENT AND PLAN / ED COURSE  Pertinent labs & imaging results that were available during my care of the patient were reviewed by me and considered in my medical decision making (see chart for details).   Patient presented to the emergency department today because of concerns for falls and altered mental status.  On exam patient is somewhat somnolent but awakens to verbal stimuli.  She is not oriented to events.  Concern for intracranial process given history of fall vs infection or sepsis given low grade fever and tachycardia. Patient head ct negative. No cervical spine injury. Patient's urine had some concerning findings for UTI. Will plan on treatment.  Will plan on admission. Discussed with hospitalist service.   ____________________________________________   FINAL CLINICAL IMPRESSION(S) / ED DIAGNOSES  Final diagnoses:  Fall, initial encounter  Lower urinary tract infectious disease  Altered mental status, unspecified altered mental status type     Note: This dictation was prepared with Dragon dictation. Any transcriptional errors that result from this process are unintentional     Phineas Semen, MD 09/01/18 1134

## 2018-09-01 NOTE — Progress Notes (Signed)
ED visit made. Patient is currently followed by [facility] at [facility] ALF with a hospice diagnosis of Heart disease with secondary diagnosis of respiratory failure. She is a DNR code, with signed out of facility DNR in place in the facility. Patient is oxygen dependent on 3 liters and at baseline is alert and oriented and ambulatory with a walker. She was sent to the [facility] ED this morning for evaluation following 2 unwitnessed falls. Per chart note review and discussion with EDP Dr. Derrill Kay and staff RN [place] head/spine CT are negative for any acute process and CXR does not show any consolidation. Flu tests negative as well. Patient seen lying on the ED stretcher was able to answer to her name, but not able to interact with writer, she is NOT at her baseline mentally. She continues with low grade fever and altered mental status. Urinalysis shows possible UTI. Writer spoke via telephone to patient's niece Sherlynn Stalls [phone]) she has requested admission. Dr. Derrill Kay updated. Hospice team updated. Will continue to follow through discharge. Dayna Barker BSN, RN, [facility] Liaison [facility] [phone]

## 2018-09-01 NOTE — ED Notes (Signed)
Niece, Sherlynn Stalls [phone]

## 2018-09-01 NOTE — ED Triage Notes (Signed)
Pt from [place] with c/o unwitnessed  2 falls last nigh. Per staff pt is lethargic, at baseline pt A&OX4. VSS . Pt on 3L [place] chronic,. MD at bedside

## 2018-09-01 NOTE — ED Notes (Signed)
ED TO INPATIENT HANDOFF REPORT  ED Nurse Name and Phone #: [phone] [place] rn   S Name/Age/Gender Sheila Hayes 78 y.o. female Room/Bed: ED36A/ED36A  Code Status   Code Status: DNR  Home/SNF/Other Nursing Home Patient oriented to: self Is this baseline? No   Triage Complete: Triage complete  Chief Complaint fall  Triage Note Pt from [place] with c/o unwitnessed  2 falls last nigh. Per staff pt is lethargic, at baseline pt A&OX4. VSS . Pt on 3L [place] chronic,. MD at bedside    Allergies No Known Allergies  Level of Care/Admitting Diagnosis ED Disposition    ED Disposition Condition Comment   Admit  Hospital Area: [facility] [[id number]]  Level of Care: Med-Surg [16]  Diagnosis: Altered mental status [780.97.ICD-9-CM]  Admitting Physician: Houston Siren [[id number]]  Attending Physician: Houston Siren [[id number]]  PT Class (Do Not Modify): Observation [104]  PT Acc Code (Do Not Modify): Observation [10022]       B Medical/Surgery History Past Medical History:  Diagnosis Date  . Chronic diastolic congestive heart failure (HCC)   . COPD (chronic obstructive pulmonary disease) (HCC)   . Diabetes mellitus without complication (HCC)   . Hyperlipidemia   . Hypertension   . Hypothyroidism   . Morbid obesity (HCC)   . Pulmonary hypertension (HCC)    Past Surgical History:  Procedure Laterality Date  . APPENDECTOMY    . CHOLECYSTECTOMY       A IV Location/Drains/Wounds Patient Lines/Drains/Airways Status   Active Line/Drains/Airways    Name:   Placement date:   Placement time:   Site:   Days:   Peripheral IV 09/01/18 Right Antecubital   09/01/18    0830    Antecubital   less than 1   Peripheral IV 09/01/18 Left Antecubital   09/01/18    0900    Antecubital   less than 1   External Urinary Catheter   06/14/18    1930    -   79          Intake/Output Last 24 hours No intake or output data in the 24 hours ending 09/01/18  1603  Labs/Imaging Results for orders placed or performed during the hospital encounter of 09/01/18 (from the past 48 hour(s))  Lactic acid, plasma     Status: None   Collection Time: 09/01/18  7:38 AM  Result Value Ref Range   Lactic Acid, Venous 1.1 0.5 - 1.9 mmol/L    Comment: Performed at [facility], [address] [place], [place] [address]  CBC with Differential     Status: Abnormal   Collection Time: 09/01/18  7:38 AM  Result Value Ref Range   WBC 7.7 4.0 - 10.5 K/uL   RBC 3.80 (L) 3.87 - 5.11 MIL/uL   Hemoglobin 9.9 (L) 12.0 - 15.0 g/dL   HCT 03.7 (L) 04.8 - 88.9 %   MCV 93.2 80.0 - 100.0 fL   MCH 26.1 26.0 - 34.0 pg   MCHC 28.0 (L) 30.0 - 36.0 g/dL   RDW 16.9 45.0 - 38.8 %   Platelets 211 150 - 400 K/uL   nRBC 0.0 0.0 - 0.2 %   Neutrophils Relative % 77 %   Neutro Abs 5.9 1.7 - 7.7 K/uL   Lymphocytes Relative 13 %   Lymphs Abs 1.0 0.7 - 4.0 K/uL   Monocytes Relative 8 %   Monocytes Absolute 0.6 0.1 - 1.0 K/uL   Eosinophils Relative 0 %  Eosinophils Absolute 0.0 0.0 - 0.5 K/uL   Basophils Relative 1 %   Basophils Absolute 0.1 0.0 - 0.1 K/uL   Immature Granulocytes 1 %   Abs Immature Granulocytes 0.05 0.00 - 0.07 K/uL    Comment: Performed at [facility], [address] [place], [place] [address]  Comprehensive metabolic panel     Status: Abnormal   Collection Time: 09/01/18  7:38 AM  Result Value Ref Range   Sodium 137 135 - 145 mmol/L   Potassium 3.9 3.5 - 5.1 mmol/L    Comment: HEMOLYSIS AT THIS LEVEL MAY AFFECT RESULT   Chloride 89 (L) 98 - 111 mmol/L   CO2 39 (H) 22 - 32 mmol/L   Glucose, Bld 191 (H) 70 - 99 mg/dL   BUN 13 8 - 23 mg/dL   Creatinine, Ser 6.04 0.44 - 1.00 mg/dL   Calcium 8.7 (L) 8.9 - 10.3 mg/dL   Total Protein 6.8 6.5 - 8.1 g/dL   Albumin 3.3 (L) 3.5 - 5.0 g/dL   AST 17 15 - 41 U/L   ALT 8 0 - 44 U/L   Alkaline Phosphatase 39 38 - 126 U/L   Total Bilirubin 1.2 0.3 - 1.2 mg/dL   GFR calc non Af Amer >60 >60  mL/min   GFR calc Af Amer >60 >60 mL/min   Anion gap 9 5 - 15    Comment: Performed at [facility], [address] [place], [place] [address]  Troponin I - ONCE - STAT     Status: None   Collection Time: 09/01/18  7:38 AM  Result Value Ref Range   Troponin I <0.03 <0.03 ng/mL    Comment: Performed at [facility], [address] [place], [place] [address]  Urinalysis, Complete w Microscopic     Status: Abnormal   Collection Time: 09/01/18  7:38 AM  Result Value Ref Range   Color, Urine AMBER (A) YELLOW    Comment: BIOCHEMICALS MAY BE AFFECTED BY COLOR   APPearance CLOUDY (A) CLEAR   Specific Gravity, Urine 1.017 1.005 - 1.030   pH 5.0 5.0 - 8.0   Glucose, UA NEGATIVE NEGATIVE mg/dL   Hgb urine dipstick SMALL (A) NEGATIVE   Bilirubin Urine NEGATIVE NEGATIVE   Ketones, ur NEGATIVE NEGATIVE mg/dL   Protein, ur 782 (A) NEGATIVE mg/dL   Nitrite NEGATIVE NEGATIVE   Leukocytes,Ua NEGATIVE NEGATIVE   RBC / HPF 21-50 0 - 5 RBC/hpf   WBC, UA 6-10 0 - 5 WBC/hpf   Bacteria, UA NONE SEEN NONE SEEN   Squamous Epithelial / LPF 0-5 0 - 5   Mucus PRESENT    Amorphous Crystal PRESENT     Comment: Performed at [facility], [address] [place], [place] [address]  Blood culture (routine x 2)     Status: None (Preliminary result)   Collection Time: 09/01/18  7:38 AM  Result Value Ref Range   Specimen Description BLOOD RIGHT AC    Special Requests      BOTTLES DRAWN AEROBIC AND ANAEROBIC Blood Culture adequate volume   Culture      NO GROWTH <12 HOURS Performed at [facility], [address] [place], [place] [address]    Report Status PENDING   Blood culture (routine x 2)     Status: None (Preliminary result)   Collection Time: 09/01/18  7:50 AM  Result Value Ref Range   Specimen Description BLOOD LEFT [facility]    Special Requests  BOTTLES DRAWN AEROBIC AND ANAEROBIC Blood Culture results may not be optimal due to an excessive volume of blood  received in culture bottles   Culture      NO GROWTH <12 HOURS Performed at [facility], [address] [place], [place] [address]    Report Status PENDING   Influenza panel by PCR (type A & B)     Status: None   Collection Time: 09/01/18  8:32 AM  Result Value Ref Range   Influenza A By PCR NEGATIVE NEGATIVE   Influenza B By PCR NEGATIVE NEGATIVE    Comment: (NOTE) The Xpert Xpress Flu assay is intended as an aid in the diagnosis of  influenza and should not be used as a sole basis for treatment.  This  assay is FDA approved for nasopharyngeal swab specimens only. Nasal  washings and aspirates are unacceptable for Xpert Xpress Flu testing. Performed at [facility], [address] [place], [place] [address]    Ct Head Wo Contrast  Result Date: 09/01/2018 CLINICAL DATA:  Larey Seat last evening. EXAM: CT HEAD WITHOUT CONTRAST CT CERVICAL SPINE WITHOUT CONTRAST TECHNIQUE: Multidetector CT imaging of the head and cervical spine was performed following the standard protocol without intravenous contrast. Multiplanar CT image reconstructions of the cervical spine were also generated. COMPARISON:  CT 06/03/2018 FINDINGS: CT HEAD FINDINGS Brain: The ventricles are in the midline without mass effect or shift. They are normal and stable in size and configuration. No extra-axial fluid collections are identified. The gray-white differentiation is maintained. No findings for hemispheric infarction or intracranial hemorrhage. No mass lesions. The brainstem and cerebellum appear grossly normal in stable. Vascular: Stable vascular calcifications. No definite aneurysm or hyperdense vessels. Skull: No skull fracture. Hyperostosis frontalis interna again noted. Sinuses/Orbits: Diffuse sinus disease. The mastoid air cells and middle ear cavities are clear. The globes are intact. Other: No scalp lesions or hematoma. CT CERVICAL SPINE FINDINGS Motion degraded images. Alignment: Normal overall  alignment. Skull base and vertebrae: No definite acute fractures. Soft tissues and spinal canal: No abnormal prevertebral soft tissue swelling or hematoma. Disc levels: Fairly generous spinal canal. No spinal stenosis. Moderate facet disease and uncinate spurring with mild multilevel foraminal narrowing. Upper chest: Emphysematous changes are noted with associated scarring changes. No mass lesions. Other: Dense bilateral carotid artery calcifications are noted. IMPRESSION: 1. No acute intracranial findings or skull fracture. 2. Motion degraded cervical spine study but no obvious fracture. Electronically Signed   By: Rudie Meyer M.D.   On: 09/01/2018 08:59   Ct Cervical Spine Wo Contrast  Result Date: 09/01/2018 CLINICAL DATA:  Larey Seat last evening. EXAM: CT HEAD WITHOUT CONTRAST CT CERVICAL SPINE WITHOUT CONTRAST TECHNIQUE: Multidetector CT imaging of the head and cervical spine was performed following the standard protocol without intravenous contrast. Multiplanar CT image reconstructions of the cervical spine were also generated. COMPARISON:  CT 06/03/2018 FINDINGS: CT HEAD FINDINGS Brain: The ventricles are in the midline without mass effect or shift. They are normal and stable in size and configuration. No extra-axial fluid collections are identified. The gray-white differentiation is maintained. No findings for hemispheric infarction or intracranial hemorrhage. No mass lesions. The brainstem and cerebellum appear grossly normal in stable. Vascular: Stable vascular calcifications. No definite aneurysm or hyperdense vessels. Skull: No skull fracture. Hyperostosis frontalis interna again noted. Sinuses/Orbits: Diffuse sinus disease. The mastoid air cells and middle ear cavities are clear. The globes are intact. Other: No scalp lesions or hematoma. CT CERVICAL SPINE FINDINGS Motion degraded  images. Alignment: Normal overall alignment. Skull base and vertebrae: No definite acute fractures. Soft tissues and spinal  canal: No abnormal prevertebral soft tissue swelling or hematoma. Disc levels: Fairly generous spinal canal. No spinal stenosis. Moderate facet disease and uncinate spurring with mild multilevel foraminal narrowing. Upper chest: Emphysematous changes are noted with associated scarring changes. No mass lesions. Other: Dense bilateral carotid artery calcifications are noted. IMPRESSION: 1. No acute intracranial findings or skull fracture. 2. Motion degraded cervical spine study but no obvious fracture. Electronically Signed   By: Rudie Meyer M.D.   On: 09/01/2018 08:59   Dg Chest Portable 1 View  Result Date: 09/01/2018 CLINICAL DATA:  Unwitnessed falls last night.  Hypertension.  COPD. EXAM: PORTABLE CHEST 1 VIEW COMPARISON:  06/14/2018 FINDINGS: Heart size upper limits of normal. Chronic aortic atherosclerosis. Pulmonary venous hypertension without frank edema. No infiltrate, collapse or effusion. No acute bone finding. IMPRESSION: Borderline cardiomegaly. Aortic atherosclerosis. Pulmonary venous hypertension without frank edema. Electronically Signed   By: Paulina Fusi M.D.   On: 09/01/2018 08:20    Pending Labs Unresulted Labs (From admission, onward)    Start     Ordered   09/08/18 0500  Creatinine, serum  (enoxaparin (LOVENOX)    CrCl >/= 30 ml/min)  Weekly,   STAT    Comments:  while on enoxaparin therapy    09/01/18 1531   09/02/18 0500  Basic metabolic panel  Tomorrow morning,   STAT     09/01/18 1531   09/02/18 0500  CBC  Tomorrow morning,   STAT     09/01/18 1531   09/01/18 1532  CBC  (enoxaparin (LOVENOX)    CrCl >/= 30 ml/min)  Once,   STAT    Comments:  Baseline for enoxaparin therapy IF NOT ALREADY DRAWN.  Notify MD if PLT < 100 K.    09/01/18 1531   09/01/18 1532  Creatinine, serum  (enoxaparin (LOVENOX)    CrCl >/= 30 ml/min)  Once,   STAT    Comments:  Baseline for enoxaparin therapy IF NOT ALREADY DRAWN.    09/01/18 1531   09/01/18 1010  Urine Culture  Add-on,   AD      09/01/18 1009          Vitals/Pain Today's Vitals   09/01/18 1230 09/01/18 1300 09/01/18 1430 09/01/18 1500  BP: (!) 109/44 (!) 122/34 (!) 106/50 (!) 120/59  Pulse: 96 (!) 104 99 (!) 102  Resp: (!) 23 (!) 26 (!) 23 20  Temp:      TempSrc:      SpO2: 98% 96% 97% 95%  PainSc:        Isolation Precautions No active isolations  Medications Medications  acetaminophen (TYLENOL) tablet 650 mg (has no administration in time range)    Or  acetaminophen (TYLENOL) suppository 650 mg (has no administration in time range)  ondansetron (ZOFRAN) tablet 4 mg (has no administration in time range)    Or  ondansetron (ZOFRAN) injection 4 mg (has no administration in time range)  enoxaparin (LOVENOX) injection 40 mg (has no administration in time range)  0.9 %  sodium chloride infusion (has no administration in time range)  cefTRIAXone (ROCEPHIN) 1 g in sodium chloride 0.9 % 100 mL IVPB (has no administration in time range)  furosemide (LASIX) tablet 20 mg (has no administration in time range)  metoprolol tartrate (LOPRESSOR) tablet 12.5 mg (has no administration in time range)  rosuvastatin (CRESTOR) tablet 40 mg (has no administration in time  range)  citalopram (CELEXA) tablet 40 mg (has no administration in time range)  levothyroxine (SYNTHROID, LEVOTHROID) tablet 125 mcg (has no administration in time range)  pantoprazole (PROTONIX) EC tablet 40 mg (has no administration in time range)  gabapentin (NEURONTIN) capsule 200 mg (has no administration in time range)  calcium carbonate (TUMS - dosed in mg elemental calcium) chewable tablet 600 mg of elemental calcium (has no administration in time range)  potassium chloride (K-DUR,KLOR-CON) CR tablet 10 mEq (has no administration in time range)  albuterol (PROVENTIL) (2.5 MG/3ML) 0.083% nebulizer solution 2.5 mg (has no administration in time range)  mometasone-formoterol (DULERA) 100-5 MCG/ACT inhaler 2 puff (has no administration in time range)   ipratropium-albuterol (DUONEB) 0.5-2.5 (3) MG/3ML nebulizer solution 3 mL (has no administration in time range)  tiotropium (SPIRIVA) inhalation capsule ([facility] use ONLY) 18 mcg (has no administration in time range)  insulin aspart (novoLOG) injection 0-9 Units (has no administration in time range)  insulin aspart (novoLOG) injection 0-5 Units (has no administration in time range)  sodium chloride 0.9 % bolus 1,000 mL (0 mLs Intravenous Stopped 09/01/18 1013)  cefTRIAXone (ROCEPHIN) 1 g in sodium chloride 0.9 % 100 mL IVPB (0 g Intravenous Stopped 09/01/18 1044)    Mobility non-ambulatory High fall risk   Focused Assessments Cardiac Assessment Handoff:  Cardiac Rhythm: Sinus tachycardia(hr 107) Lab Results  Component Value Date   CKTOTAL 33 11/04/2011   CKMB 1.5 11/04/2011   TROPONINI <0.03 09/01/2018   Lab Results  Component Value Date   DDIMER 0.66 (H) 06/14/2018   Does the Patient currently have chest pain? No     R Recommendations: See Admitting Provider Note  Report given to:   Additional Notes:  Pt is ambulatory and A&OX4 at baseline, pt sleeping all day, alert to self when awake

## 2018-09-01 NOTE — ED Notes (Signed)
Pure wick applied to pt at this time for incontinence

## 2018-09-01 NOTE — H&P (Signed)
[facility] - [place] at [facility]    PATIENT NAME: Sheila Hayes    MR#:  [id number]  DATE OF BIRTH:  Oct 14, 1940  DATE OF ADMISSION:  09/01/2018  PRIMARY CARE PHYSICIAN: [facility]   REQUESTING/REFERRING PHYSICIAN: Dr. Phineas Semen  CHIEF COMPLAINT:   Chief Complaint  Patient presents with   Fall    HISTORY OF PRESENT ILLNESS:  Sheila Hayes  is a 78 y.o. female with a known history of COPD, hypertension, hyperlipidemia, hypothyroidism, obesity, pulmonary hypertension, chronic diastolic CHF who presents to the hospital from a assisted living due to recurrent falls and altered mental status.  Patient herself is a poor historian therefore most of the history obtained from the chart and from the ER physician.  Patient apparently has had multiple falls over the past 2 days, and her mental status has been worse.  Patient is more more confused and lethargic.  She was therefore sent to the ER for further evaluation.  Patient underwent CT of the head, cervical spine which was negative for acute pathology.  Her urinalysis is mildly positive for UTI and therefore she started to have altered mental status/encephalopathy secondary to UTI and hospitalist services were contacted for admission.  Patient has no fever chills nausea vomiting or any other associated symptoms presently.  PAST MEDICAL HISTORY:   Past Medical History:  Diagnosis Date   Chronic diastolic congestive heart failure (HCC)    COPD (chronic obstructive pulmonary disease) (HCC)    Diabetes mellitus without complication (HCC)    Hyperlipidemia    Hypertension    Hypothyroidism    Morbid obesity (HCC)    Pulmonary hypertension (HCC)     PAST SURGICAL HISTORY:   Past Surgical History:  Procedure Laterality Date   APPENDECTOMY     CHOLECYSTECTOMY      SOCIAL HISTORY:   Social History   Tobacco Use   Smoking status: Former Smoker   Smokeless tobacco: Never Used    Substance Use Topics   Alcohol use: No    FAMILY HISTORY:   Family History  Problem Relation Age of Onset   Heart attack Mother    Heart attack Father    Diabetes Sister    Hypertension Sister    Hypertension Brother    Diabetes Brother    Lung cancer Brother    Colon cancer Brother     DRUG ALLERGIES:  No Known Allergies  REVIEW OF SYSTEMS:   Review of Systems  Unable to perform ROS: Mental acuity    MEDICATIONS AT HOME:   Prior to Admission medications   Medication Sig Start Date End Date Taking? Authorizing Provider  acetaminophen (TYLENOL) 500 MG tablet Take 500 mg by mouth every 6 (six) hours as needed for headache.    [provider]  albuterol (PROVENTIL HFA;VENTOLIN HFA) 108 (90 Base) MCG/ACT inhaler Inhale 2 puffs into the lungs every 6 (six) hours as needed for wheezing or shortness of breath.    [provider]  budesonide-formoterol (SYMBICORT) 80-4.5 MCG/ACT inhaler Inhale 2 puffs into the lungs 2 (two) times daily.    [provider]  calcium carbonate (OSCAL) 1500 (600 Ca) MG TABS tablet Take 600 mg of elemental calcium by mouth 2 (two) times daily with a meal.     [provider]  citalopram (CELEXA) 40 MG tablet Take 40 mg by mouth daily.    [provider]  docusate sodium (COLACE) 100 MG capsule Take 200 mg by mouth at bedtime.  [provider]  furosemide (LASIX) 20 MG tablet Take 20 mg by mouth daily.    [provider]  gabapentin (NEURONTIN) 100 MG capsule Take 200 mg by mouth 2 (two) times daily.    [provider]  glipiZIDE (GLUCOTROL) 10 MG tablet Take 10 mg by mouth daily before breakfast.    [provider]  levothyroxine (SYNTHROID, LEVOTHROID) 125 MCG tablet Take 125 mcg by mouth daily before breakfast.    [provider]  LORazepam (ATIVAN) 0.5 MG tablet Take 0.5 mg by mouth every 4 (four) hours as needed for anxiety.    [provider]  metFORMIN (GLUCOPHAGE) 500 MG tablet Take 1,000 mg by mouth 2 (two) times daily with a meal.     [provider]  metoprolol tartrate (LOPRESSOR) 25 MG tablet Take 0.5 tablets (12.5 mg total) by mouth 2 (two) times daily. Patient taking differently: Take 12.5 mg by mouth 2 (two) times daily. HOLD for SBP <100 or HR <60 05/20/16   Shaune Pollack, MD  nystatin (NYSTATIN) powder Apply topically 2 (two) times daily as needed. For fungal growth    [provider]  omeprazole (PRILOSEC) 20 MG capsule Take 20 mg by mouth daily.    [provider]  potassium chloride (K-DUR) 10 MEQ tablet Take 10 mEq by mouth daily.    [provider]  prochlorperazine (COMPAZINE) 10 MG tablet Take 10 mg by mouth every 6 (six) hours as needed for nausea or vomiting.    [provider]  rosuvastatin (CRESTOR) 40 MG tablet Take 40 mg by mouth every evening.     [provider]  tiotropium (SPIRIVA) 18 MCG inhalation capsule Place 18 mcg into inhaler and inhale daily.    [provider]      VITAL SIGNS:  Blood pressure (!) 103/42, pulse 99, temperature 100.1 F (37.8 C), temperature source Rectal, resp. rate (!) 26, SpO2 98 %.  PHYSICAL EXAMINATION:  Physical Exam  GENERAL:  78 y.o.-year-old patient lying in the bed lethargic/encephalopathic  EYES: Pupils equal, round, reactive to light and accommodation. No scleral icterus. Extraocular muscles intact.   HEENT: Head atraumatic, normocephalic. Oropharynx and nasopharynx clear. No oropharyngeal erythema, dry oral mucosa  NECK:  Supple, no jugular venous distention. No thyroid enlargement, no tenderness.  LUNGS: Poor Resp. effort, no wheezing, rales, rhonchi. No use of accessory muscles of respiration.  CARDIOVASCULAR: S1, S2 RRR. No murmurs, rubs, gallops, clicks.  ABDOMEN: Soft, nontender, nondistended. Bowel sounds present. No organomegaly or mass.  EXTREMITIES: No pedal edema, cyanosis, or clubbing. + 2  pedal & radial pulses b/l.   NEUROLOGIC: Cranial nerves II through XII are intact. No focal Motor or sensory deficits appreciated b/l. Globally weak and Encephalopathic PSYCHIATRIC: The patient is alert and oriented x 1.  Globally weak and encephalopathic. SKIN: No obvious rash, lesion, or ulcer.   LABORATORY PANEL:   CBC Recent Labs  Lab 09/01/18 0738  WBC 7.7  HGB 9.9*  HCT 35.4*  PLT 211   ------------------------------------------------------------------------------------------------------------------  Chemistries  Recent Labs  Lab 09/01/18 0738  NA 137  K 3.9  CL 89*  CO2 39*  GLUCOSE 191*  BUN 13  CREATININE 0.70  CALCIUM 8.7*  AST 17  ALT 8  ALKPHOS 39  BILITOT 1.2   ------------------------------------------------------------------------------------------------------------------  Cardiac Enzymes Recent Labs  Lab 09/01/18 0738  TROPONINI <0.03   ------------------------------------------------------------------------------------------------------------------  RADIOLOGY:  Ct Head Wo Contrast  Result Date: 09/01/2018 CLINICAL DATA:  Larey Seat last evening.  EXAM: CT HEAD WITHOUT CONTRAST CT CERVICAL SPINE WITHOUT CONTRAST TECHNIQUE: Multidetector CT imaging of the head and cervical spine was performed following the standard protocol without intravenous contrast. Multiplanar CT image reconstructions of the cervical spine were also generated. COMPARISON:  CT 06/03/2018 FINDINGS: CT HEAD FINDINGS Brain: The ventricles are in the midline without mass effect or shift. They are normal and stable in size and configuration. No extra-axial fluid collections are identified. The gray-white differentiation is maintained. No findings for hemispheric infarction or intracranial hemorrhage. No mass lesions. The brainstem and cerebellum appear grossly normal in stable. Vascular: Stable vascular calcifications. No definite aneurysm or hyperdense vessels. Skull: No skull fracture.  Hyperostosis frontalis interna again noted. Sinuses/Orbits: Diffuse sinus disease. The mastoid air cells and middle ear cavities are clear. The globes are intact. Other: No scalp lesions or hematoma. CT CERVICAL SPINE FINDINGS Motion degraded images. Alignment: Normal overall alignment. Skull base and vertebrae: No definite acute fractures. Soft tissues and spinal canal: No abnormal prevertebral soft tissue swelling or hematoma. Disc levels: Fairly generous spinal canal. No spinal stenosis. Moderate facet disease and uncinate spurring with mild multilevel foraminal narrowing. Upper chest: Emphysematous changes are noted with associated scarring changes. No mass lesions. Other: Dense bilateral carotid artery calcifications are noted. IMPRESSION: 1. No acute intracranial findings or skull fracture. 2. Motion degraded cervical spine study but no obvious fracture. Electronically Signed   By: Rudie Meyer M.D.   On: 09/01/2018 08:59   Ct Cervical Spine Wo Contrast  Result Date: 09/01/2018 CLINICAL DATA:  Larey Seat last evening. EXAM: CT HEAD WITHOUT CONTRAST CT CERVICAL SPINE WITHOUT CONTRAST TECHNIQUE: Multidetector CT imaging of the head and cervical spine was performed following the standard protocol without intravenous contrast. Multiplanar CT image reconstructions of the cervical spine were also generated. COMPARISON:  CT 06/03/2018 FINDINGS: CT HEAD FINDINGS Brain: The ventricles are in the midline without mass effect or shift. They are normal and stable in size and configuration. No extra-axial fluid collections are identified. The gray-white differentiation is maintained. No findings for hemispheric infarction or intracranial hemorrhage. No mass lesions. The brainstem and cerebellum appear grossly normal in stable. Vascular: Stable vascular calcifications. No definite aneurysm or hyperdense vessels. Skull: No skull fracture. Hyperostosis frontalis interna again noted. Sinuses/Orbits: Diffuse sinus disease. The  mastoid air cells and middle ear cavities are clear. The globes are intact. Other: No scalp lesions or hematoma. CT CERVICAL SPINE FINDINGS Motion degraded images. Alignment: Normal overall alignment. Skull base and vertebrae: No definite acute fractures. Soft tissues and spinal canal: No abnormal prevertebral soft tissue swelling or hematoma. Disc levels: Fairly generous spinal canal. No spinal stenosis. Moderate facet disease and uncinate spurring with mild multilevel foraminal narrowing. Upper chest: Emphysematous changes are noted with associated scarring changes. No mass lesions. Other: Dense bilateral carotid artery calcifications are noted. IMPRESSION: 1. No acute intracranial findings or skull fracture. 2. Motion degraded cervical spine study but no obvious fracture. Electronically Signed   By: Rudie Meyer M.D.   On: 09/01/2018 08:59   Dg Chest Portable 1 View  Result Date: 09/01/2018 CLINICAL DATA:  Unwitnessed falls last night.  Hypertension.  COPD. EXAM: PORTABLE CHEST 1 VIEW COMPARISON:  06/14/2018 FINDINGS: Heart size upper limits of normal. Chronic aortic atherosclerosis. Pulmonary venous hypertension without frank edema. No infiltrate, collapse or effusion. No acute bone finding. IMPRESSION: Borderline cardiomegaly. Aortic atherosclerosis. Pulmonary venous hypertension without frank edema. Electronically Signed   By: Paulina Fusi M.D.   On: 09/01/2018 08:20  IMPRESSION AND PLAN:   78 year old female with past medical history of essential hypertension, hyperlipidemia, hypothyroidism, obesity, diabetes, COPD, chronic diastolic CHF who presents to the hospital after recurrent falls and altered mental status.  1.  Altered mental status-metabolic encephalopathy secondary to suspected UTI. - Patient CT head and cervical spine is negative for acute pathology.  There is no other acute infectious or metabolic source identified. - We will give the patient gentle IV fluids, IV ceftriaxone for  the UTI.  Follow mental status.    2.  Recurrent falls- secondary to possible underlying UTI with deconditioning. -We will treat underlying UTI with IV ceftriaxone as mentioned.  Give gentle IV fluids.  We will get physical therapy consult to assess mobility.  3.  COPD-no acute exacerbation-continue Spiriva, Dulera.    4.  Hypothyroidism-continue Synthroid.    5.  History of chronic diastolic CHF-clinically patient is not in congestive heart failure. -Continue Lasix, metoprolol.  6.  GERD-continue Protonix.  7.  Hyperlipidemia-continue Crestor.  8.  Depression-continue Celexa.     All the records are reviewed and case discussed with ED provider. Management plans discussed with the patient, family and they are in agreement.  CODE STATUS: DNR  TOTAL TIME TAKING CARE OF THIS PATIENT: 40 minutes.    Houston SirenVivek J Masey Scheiber M.D on 09/01/2018 at 11:11 AM  Between 7am to 6pm - Pager - [phone]  After 6pm go to [email or web address] - password [email or web address] [facility]  [facility]  Office  [phone]  CC: Primary care physician; [facility]

## 2018-09-02 DIAGNOSIS — Z87440 Personal history of urinary (tract) infections: Secondary | ICD-10-CM | POA: Diagnosis not present

## 2018-09-02 DIAGNOSIS — Z7951 Long term (current) use of inhaled steroids: Secondary | ICD-10-CM | POA: Diagnosis not present

## 2018-09-02 DIAGNOSIS — Z6836 Body mass index (BMI) 36.0-36.9, adult: Secondary | ICD-10-CM | POA: Diagnosis not present

## 2018-09-02 DIAGNOSIS — I110 Hypertensive heart disease with heart failure: Secondary | ICD-10-CM | POA: Diagnosis present

## 2018-09-02 DIAGNOSIS — Z79890 Hormone replacement therapy (postmenopausal): Secondary | ICD-10-CM | POA: Diagnosis not present

## 2018-09-02 DIAGNOSIS — E876 Hypokalemia: Secondary | ICD-10-CM | POA: Diagnosis present

## 2018-09-02 DIAGNOSIS — G9341 Metabolic encephalopathy: Secondary | ICD-10-CM | POA: Diagnosis present

## 2018-09-02 DIAGNOSIS — N390 Urinary tract infection, site not specified: Secondary | ICD-10-CM | POA: Diagnosis present

## 2018-09-02 DIAGNOSIS — R296 Repeated falls: Secondary | ICD-10-CM | POA: Diagnosis present

## 2018-09-02 DIAGNOSIS — Z7984 Long term (current) use of oral hypoglycemic drugs: Secondary | ICD-10-CM | POA: Diagnosis not present

## 2018-09-02 DIAGNOSIS — I951 Orthostatic hypotension: Secondary | ICD-10-CM | POA: Diagnosis present

## 2018-09-02 DIAGNOSIS — J9801 Acute bronchospasm: Secondary | ICD-10-CM | POA: Diagnosis not present

## 2018-09-02 DIAGNOSIS — I5032 Chronic diastolic (congestive) heart failure: Secondary | ICD-10-CM | POA: Diagnosis present

## 2018-09-02 DIAGNOSIS — Z515 Encounter for palliative care: Secondary | ICD-10-CM | POA: Diagnosis present

## 2018-09-02 DIAGNOSIS — Z66 Do not resuscitate: Secondary | ICD-10-CM | POA: Diagnosis present

## 2018-09-02 DIAGNOSIS — K219 Gastro-esophageal reflux disease without esophagitis: Secondary | ICD-10-CM | POA: Diagnosis present

## 2018-09-02 DIAGNOSIS — Z79899 Other long term (current) drug therapy: Secondary | ICD-10-CM | POA: Diagnosis not present

## 2018-09-02 DIAGNOSIS — I272 Pulmonary hypertension, unspecified: Secondary | ICD-10-CM | POA: Diagnosis present

## 2018-09-02 DIAGNOSIS — E785 Hyperlipidemia, unspecified: Secondary | ICD-10-CM | POA: Diagnosis present

## 2018-09-02 DIAGNOSIS — E119 Type 2 diabetes mellitus without complications: Secondary | ICD-10-CM | POA: Diagnosis present

## 2018-09-02 DIAGNOSIS — F329 Major depressive disorder, single episode, unspecified: Secondary | ICD-10-CM | POA: Diagnosis present

## 2018-09-02 DIAGNOSIS — E039 Hypothyroidism, unspecified: Secondary | ICD-10-CM | POA: Diagnosis present

## 2018-09-02 DIAGNOSIS — R7881 Bacteremia: Secondary | ICD-10-CM | POA: Diagnosis present

## 2018-09-02 DIAGNOSIS — J441 Chronic obstructive pulmonary disease with (acute) exacerbation: Secondary | ICD-10-CM | POA: Diagnosis not present

## 2018-09-02 LAB — BLOOD CULTURE ID PANEL (REFLEXED)
Acinetobacter baumannii: NOT DETECTED
CANDIDA ALBICANS: NOT DETECTED
Candida glabrata: NOT DETECTED
Candida krusei: NOT DETECTED
Candida parapsilosis: NOT DETECTED
Candida tropicalis: NOT DETECTED
ENTEROBACTERIACEAE SPECIES: NOT DETECTED
Enterobacter cloacae complex: NOT DETECTED
Enterococcus species: NOT DETECTED
Escherichia coli: NOT DETECTED
Haemophilus influenzae: NOT DETECTED
Klebsiella oxytoca: NOT DETECTED
Klebsiella pneumoniae: NOT DETECTED
Listeria monocytogenes: NOT DETECTED
Methicillin resistance: DETECTED — AB
Neisseria meningitidis: NOT DETECTED
Proteus species: NOT DETECTED
Pseudomonas aeruginosa: NOT DETECTED
Serratia marcescens: NOT DETECTED
Staphylococcus aureus (BCID): NOT DETECTED
Staphylococcus species: DETECTED — AB
Streptococcus agalactiae: NOT DETECTED
Streptococcus pneumoniae: NOT DETECTED
Streptococcus pyogenes: NOT DETECTED
Streptococcus species: NOT DETECTED

## 2018-09-02 LAB — BASIC METABOLIC PANEL
Anion gap: 5 (ref 5–15)
BUN: 12 mg/dL (ref 8–23)
CO2: 44 mmol/L — ABNORMAL HIGH (ref 22–32)
Calcium: 8.5 mg/dL — ABNORMAL LOW (ref 8.9–10.3)
Chloride: 93 mmol/L — ABNORMAL LOW (ref 98–111)
Creatinine, Ser: 0.76 mg/dL (ref 0.44–1.00)
GFR calc Af Amer: >60 mL/min (ref >60)
GFR calc non Af Amer: >60 mL/min (ref >60)
Glucose, Bld: 158 mg/dL — ABNORMAL HIGH (ref 70–99)
Potassium: 3.4 mmol/L — ABNORMAL LOW (ref 3.5–5.1)
Sodium: 142 mmol/L (ref 135–145)

## 2018-09-02 LAB — GLUCOSE, CAPILLARY
Glucose-Capillary: 120 mg/dL — ABNORMAL HIGH (ref 70–99)
Glucose-Capillary: 195 mg/dL — ABNORMAL HIGH (ref 70–99)
Glucose-Capillary: 153 mg/dL — ABNORMAL HIGH (ref 70–99)
Glucose-Capillary: 171 mg/dL — ABNORMAL HIGH (ref 70–99)

## 2018-09-02 LAB — CBC
HCT: 31.9 % — ABNORMAL LOW (ref 36.0–46.0)
Hemoglobin: 8.9 g/dL — ABNORMAL LOW (ref 12.0–15.0)
MCH: 25.9 pg — ABNORMAL LOW (ref 26.0–34.0)
MCHC: 27.9 g/dL — ABNORMAL LOW (ref 30.0–36.0)
MCV: 92.7 fL (ref 80.0–100.0)
Platelets: 179 10*3/uL (ref 150–400)
RBC: 3.44 MIL/uL — ABNORMAL LOW (ref 3.87–5.11)
RDW: 14.4 % (ref 11.5–15.5)
WBC: 5.4 10*3/uL (ref 4.0–10.5)
nRBC: 0 % (ref 0.0–0.2)

## 2018-09-02 LAB — URINE CULTURE: Culture: >=100000 — AB

## 2018-09-02 MED ORDER — CITALOPRAM HYDROBROMIDE 20 MG PO TABS
20.0000 mg | ORAL_TABLET | Freq: Every day | ORAL | Status: DC
  Administered 2018-09-03 – 2018-09-04 (×2): 20 mg via ORAL
  Filled 2018-09-02 (×2): qty 1

## 2018-09-02 MED ORDER — VANCOMYCIN HCL IN DEXTROSE 750-5 MG/150ML-% IV SOLN
750.0000 mg | Freq: Two times a day (BID) | INTRAVENOUS | Status: DC
  Filled 2018-09-02: qty 150

## 2018-09-02 MED ORDER — VANCOMYCIN HCL 10 G IV SOLR
1750.0000 mg | Freq: Once | INTRAVENOUS | Status: AC
  Administered 2018-09-02: 05:00:00 1750 mg via INTRAVENOUS
  Filled 2018-09-02: qty 1750

## 2018-09-02 NOTE — TOC Initial Note (Signed)
Transition of Care [facility]) - Initial/Assessment Note    Patient Details  Name: Sheila Hayes MRN: [id number] Date of Birth: 1940-10-26  Transition of Care [facility]) CM/SW Contact:    Gwenette Greet, RN Phone Number: 09/02/2018, 10:59 AM  Clinical Narrative:     Admitted to [facility] post falls with altered mental status and observation status.  A resident of [facility] x 9 years    Niece is [name] [phone])  Seen by [facility]                Patient Goals and CMS Choice Physical therapy evaluation pending        Expected Discharge Plan and Services: To be determined                                  Prior Living Arrangements/Services [facility]                       Activities of Daily Living Home Assistive Devices/Equipment: Environmental consultant (specify type), Wheelchair ADL Screening (condition at time of admission) Patient's cognitive ability adequate to safely complete daily activities?: No Is the patient deaf or have difficulty hearing?: Yes Does the patient have difficulty seeing, even when wearing glasses/contacts?: No Does the patient have difficulty concentrating, remembering, or making decisions?: Yes Patient able to express need for assistance with ADLs?: Yes Does the patient have difficulty dressing or bathing?: Yes Independently performs ADLs?: No Communication: Independent Dressing (OT): Needs assistance Is this a change from baseline?: Pre-admission baseline Grooming: Needs assistance Is this a change from baseline?: Pre-admission baseline Feeding: Independent Bathing: Needs assistance Is this a change from baseline?: Pre-admission baseline Toileting: Needs assistance Is this a change from baseline?: Pre-admission baseline In/Out Bed: Needs assistance Is this a change from baseline?: Pre-admission baseline Walks in Home: Independent with device (comment) Does the patient have difficulty walking or climbing  stairs?: Yes Weakness of Legs: Both Weakness of Arms/Hands: None  Permission Sought/Granted yes                  Emotional Assessment sleepy              Admission diagnosis:  Lower urinary tract infectious disease [N39.0] Fall, initial encounter [W19.XXXA] Altered mental status, unspecified altered mental status type [R41.82] Patient Active Problem List   Diagnosis Date Noted  . MRSA bacteremia 09/02/2018  . Acute respiratory failure with hypercapnia (HCC) 06/14/2018  . Acute on chronic diastolic CHF (congestive heart failure) (HCC) 02/23/2018  . Altered mental status   . Urinary tract infection without hematuria   . Acute respiratory failure with hypoxia and hypercapnia (HCC) 01/30/2018  . Near syncope 10/09/2017  . HTN (hypertension) 10/09/2017  . Diabetes (HCC) 10/09/2017  . HLD (hyperlipidemia) 10/09/2017  . Chronic diastolic CHF (congestive heart failure) (HCC) 10/09/2017  . Hypothyroidism 10/09/2017  . COPD exacerbation (HCC) 05/13/2016   PCP:  [employer]:   [facility] - [place], [place] - [address] [place] [address] Phone: [phone] Fax: [phone]     Social Determinants of Health (SDOH) Interventions    Readmission Risk Interventions 30 Day Unplanned Readmission Risk Score     ED to Hosp-Admission (Current) from 09/01/2018 in [facility] (1C)  30 Day Unplanned Readmission Risk Score (%)  28 Filed at 09/02/2018 0801  This score is the patient's risk of an unplanned readmission within 30 days of being discharged (0 -100%). The score is based on dignosis, age, lab data, medications, orders, and past utilization.   Low:  0-14.9   Medium: 15-21.9   High: 22-29.9   Extreme: 30 and above       Readmission Risk Prevention Plan 02/28/2018  Transportation Screening Complete  PCP or Specialist Appt within 3-5 Days (No  Data)  Home Care Screening Complete  HRI or Home Care Consult Complete  Social Work Consult for Recovery Care Planning/Counseling Not Complete  Palliative Care Screening Not Complete  Medication Review [facility]) Complete  Some recent data might be hidden

## 2018-09-02 NOTE — Care Management Obs Status (Signed)
MEDICARE OBSERVATION STATUS NOTIFICATION   Patient Details  Name: Sheila Hayes MRN: [id number] Date of Birth: 10-09-1940   Medicare Observation Status Notification Given:  Yes    Gwenette Greet, RN 09/02/2018, 10:59 AM

## 2018-09-02 NOTE — Progress Notes (Signed)
[facility] - [place] at [facility]   PATIENT NAME: Sheila Hayes    MR#:  [id number]  DATE OF BIRTH:  10-13-1940  SUBJECTIVE:  CHIEF COMPLAINT:   Chief Complaint  Patient presents with  . Fall   Patient is confused.  On oxygen by nasal cannula 4 L.  Orthostatic hypotension per vital sign. REVIEW OF SYSTEMS:  Review of Systems  Unable to perform ROS: Mental status change    DRUG ALLERGIES:  No Known Allergies VITALS:  Blood pressure 93/62, pulse 83, temperature 98.4 F (36.9 C), resp. rate 20, weight 105.1 kg, SpO2 93 %. PHYSICAL EXAMINATION:  Physical Exam Constitutional:      General: She is not in acute distress. HENT:     Head: Normocephalic.     Mouth/Throat:     Mouth: Mucous membranes are moist.  Eyes:     General: No scleral icterus.    Conjunctiva/sclera: Conjunctivae normal.     Pupils: Pupils are equal, round, and reactive to light.  Neck:     Musculoskeletal: Normal range of motion and neck supple.     Vascular: No JVD.     Trachea: No tracheal deviation.  Cardiovascular:     Rate and Rhythm: Normal rate and regular rhythm.     Heart sounds: Normal heart sounds. No murmur. No gallop.   Pulmonary:     Effort: Pulmonary effort is normal. No respiratory distress.     Breath sounds: Normal breath sounds. No wheezing or rales.  Abdominal:     General: Bowel sounds are normal. There is no distension.     Palpations: Abdomen is soft.     Tenderness: There is no abdominal tenderness. There is no rebound.  Musculoskeletal: Normal range of motion.        General: No tenderness.     Right lower leg: No edema.     Left lower leg: No edema.  Skin:    Findings: No erythema or rash.  Neurological:     Mental Status: She is alert.     Comments: Confused, unable to exam.    LABORATORY PANEL:  Female CBC Recent Labs  Lab 09/02/18 0254  WBC 5.4  HGB 8.9*  HCT 31.9*  PLT 179    ------------------------------------------------------------------------------------------------------------------ Chemistries  Recent Labs  Lab 09/01/18 0738 09/02/18 0254  NA 137 142  K 3.9 3.4*  CL 89* 93*  CO2 39* 44*  GLUCOSE 191* 158*  BUN 13 12  CREATININE 0.70 0.76  CALCIUM 8.7* 8.5*  AST 17  --   ALT 8  --   ALKPHOS 39  --   BILITOT 1.2  --    RADIOLOGY:  No results found. ASSESSMENT AND PLAN:   78 year old female with past medical history of essential hypertension, hyperlipidemia, hypothyroidism, obesity, diabetes, COPD, chronic diastolic CHF who presents to the hospital after recurrent falls and altered mental status.  1.  Altered mental status-metabolic encephalopathy secondary to suspected UTI. - Patient CT head and cervical spine is negative for acute pathology.  There is no other acute infectious or metabolic source identified. Continue IV ceftriaxone for the UTI.  Follow up urine culture and sensitivity.   2.  Recurrent falls- secondary to possible underlying UTI with deconditioning. Continue UTI with IV ceftriaxone as mentioned.  Give gentle IV fluids.   PT.  3.  COPD-no acute exacerbation-continue Spiriva, Dulera.    4.  Hypothyroidism-continue Synthroid.    5.  History of chronic diastolic CHF-clinically patient is not  in congestive heart failure. -Continue Lasix, metoprolol.  6.  GERD-continue Protonix.  7.  Hyperlipidemia-continue Crestor.  8.  Depression-continue Celexa.  Hypokalemia.  Potassium supplement.  All the records are reviewed and case discussed with Care Management/Social Worker. Management plans discussed with the patient, family and they are in agreement.  CODE STATUS: DNR  TOTAL TIME TAKING CARE OF THIS PATIENT: 32 minutes.   More than 50% of the time was spent in counseling/coordination of care: YES  POSSIBLE D/C IN 1-2 DAYS, DEPENDING ON CLINICAL CONDITION.   Shaune Pollack M.D on 09/02/2018 at 1:49 PM  Between 7am  to 6pm - Pager - [phone]  After 6pm go to [email or web address] - Therapist, [facility] Hospitalists

## 2018-09-02 NOTE — Progress Notes (Signed)
Visit made. Patient seen siting up in bed, alert and interactive, able to converse with Clinical research associate. Denied pain or discomfort. She continues on IV fluids and antibiotics. Per chart note review she ate well at breakfast. Physical therapy note revieiwed, patient with drop in BP with bed mobility. Plan is for return to [facility] when medically stable. Will continue to follow and update hospice team. Dayna Barker BSN, RN, [facility] Liaison [facility] [place]) [phone]

## 2018-09-02 NOTE — [place] FL2 (Addendum)
[place] MEDICAID FL2 LEVEL OF CARE SCREENING TOOL     IDENTIFICATION  Patient Name: Sheila Hayes Birthdate: 07-30-1940 Sex: female Admission Date (Current Location): 09/01/2018  [place] and [place] Number:  Chiropodist and Address:  [facility], [address] [place], [place] [address]      Provider Number: [id number]  Attending Physician Name and Address:  Shaune Pollack, MD  Relative Name and Phone Number:       Current Level of Care: Hospital Recommended Level of Care: Assisted Living Facility Prior Approval Number:    Date Approved/Denied:   PASRR Number:    Discharge Plan: Domiciliary (Rest home)    Current Diagnoses: Patient Active Problem List   Diagnosis Date Noted  . MRSA bacteremia 09/02/2018  . Acute respiratory failure with hypercapnia (HCC) 06/14/2018  . Acute on chronic diastolic CHF (congestive heart failure) (HCC) 02/23/2018  . Altered mental status   . Urinary tract infection without hematuria   . Acute respiratory failure with hypoxia and hypercapnia (HCC) 01/30/2018  . Near syncope 10/09/2017  . HTN (hypertension) 10/09/2017  . Diabetes (HCC) 10/09/2017  . HLD (hyperlipidemia) 10/09/2017  . Chronic diastolic CHF (congestive heart failure) (HCC) 10/09/2017  . Hypothyroidism 10/09/2017  . COPD exacerbation (HCC) 05/13/2016    Orientation RESPIRATION BLADDER Height & Weight     Self, Place  O2(4 liters ) Incontinent Weight: 231 lb 11.3 oz (105.1 kg) Height:     BEHAVIORAL SYMPTOMS/MOOD NEUROLOGICAL BOWEL NUTRITION STATUS  (none) (none) Incontinent Diet(Heart Healthy )  AMBULATORY STATUS COMMUNICATION OF NEEDS Skin   Limited Assist Verbally Normal                       Personal Care Assistance Level of Assistance  Bathing, Feeding, Dressing Bathing Assistance: Limited assistance Feeding assistance: Independent Dressing Assistance: Limited assistance     Functional Limitations Info  Sight,  Hearing, Speech Sight Info: Adequate Hearing Info: Adequate Speech Info: Adequate    SPECIAL CARE FACTORS FREQUENCY                       Contractures Contractures Info: Not present    Additional Factors Info  Code Status, Allergies Code Status Info: DNR Allergies Info: NKA           Current Medications (09/02/2018):  This is the current hospital active medication list Current Facility-Administered Medications  Medication Dose Route Frequency Provider Last Rate Last Dose  . 0.9 %  sodium chloride infusion   Intravenous Continuous Houston Siren, MD   Stopped at 09/02/18 0518  . acetaminophen (TYLENOL) tablet 650 mg  650 mg Oral Q6H PRN Houston Siren, MD       Or  . acetaminophen (TYLENOL) suppository 650 mg  650 mg Rectal Q6H PRN Sainani, Rolly Pancake, MD      . albuterol (PROVENTIL) (2.5 MG/3ML) 0.083% nebulizer solution 2.5 mg  2.5 mg Inhalation Q6H PRN Houston Siren, MD      . calcium carbonate (TUMS - dosed in mg elemental calcium) chewable tablet 600 mg of elemental calcium  600 mg of elemental calcium Oral BID WC Houston Siren, MD   600 mg of elemental calcium at 09/02/18 1019  . cefTRIAXone (ROCEPHIN) 1 g in sodium chloride 0.9 % 100 mL IVPB  1 g Intravenous Q24H Houston Siren, MD 200 mL/hr at 09/02/18 1019 1 g at 09/02/18 1019  . [START ON 09/03/2018] citalopram (  CELEXA) tablet 20 mg  20 mg Oral Daily Shaune Pollack, MD      . enoxaparin (LOVENOX) injection 40 mg  40 mg Subcutaneous Q24H Houston Siren, MD   40 mg at 09/01/18 2250  . furosemide (LASIX) tablet 20 mg  20 mg Oral Daily Houston Siren, MD   20 mg at 09/02/18 1020  . gabapentin (NEURONTIN) capsule 200 mg  200 mg Oral BID Houston Siren, MD   200 mg at 09/02/18 1020  . insulin aspart (novoLOG) injection 0-5 Units  0-5 Units Subcutaneous QHS Sainani, Vivek J, MD      . insulin aspart (novoLOG) injection 0-9 Units  0-9 Units Subcutaneous TID WC Houston Siren, MD   1 Units at 09/01/18 1802  .  ipratropium-albuterol (DUONEB) 0.5-2.5 (3) MG/3ML nebulizer solution 3 mL  3 mL Nebulization Q6H PRN Houston Siren, MD   3 mL at 09/01/18 1710  . levothyroxine (SYNTHROID, LEVOTHROID) tablet 125 mcg  125 mcg Oral QAC breakfast Houston Siren, MD   125 mcg at 09/02/18 0519  . metoprolol tartrate (LOPRESSOR) tablet 12.5 mg  12.5 mg Oral BID Houston Siren, MD   12.5 mg at 09/02/18 1020  . mometasone-formoterol (DULERA) 100-5 MCG/ACT inhaler 2 puff  2 puff Inhalation BID Houston Siren, MD   2 puff at 09/02/18 1025  . ondansetron (ZOFRAN) tablet 4 mg  4 mg Oral Q6H PRN Houston Siren, MD       Or  . ondansetron (ZOFRAN) injection 4 mg  4 mg Intravenous Q6H PRN Houston Siren, MD      . pantoprazole (PROTONIX) EC tablet 40 mg  40 mg Oral Daily Houston Siren, MD   40 mg at 09/02/18 1020  . potassium chloride (K-DUR,KLOR-CON) CR tablet 10 mEq  10 mEq Oral Daily Houston Siren, MD   10 mEq at 09/02/18 1020  . rosuvastatin (CRESTOR) tablet 40 mg  40 mg Oral QPM Houston Siren, MD   40 mg at 09/01/18 1802  . tiotropium (SPIRIVA) inhalation capsule ([facility] use ONLY) 18 mcg  18 mcg Inhalation Daily Houston Siren, MD         Discharge Medications: Medication List    TAKE these medications   acetaminophen 500 MG tablet Commonly known as:  TYLENOL Take 500 mg by mouth every 6 (six) hours as needed for headache.   albuterol 108 (90 Base) MCG/ACT inhaler Commonly known as:  PROVENTIL HFA;VENTOLIN HFA Inhale 2 puffs into the lungs every 6 (six) hours as needed for wheezing or shortness of breath.   budesonide-formoterol 80-4.5 MCG/ACT inhaler Commonly known as:  SYMBICORT Inhale 2 puffs into the lungs 2 (two) times daily.   calcium carbonate 1500 (600 Ca) MG Tabs tablet Commonly known as:  OSCAL Take 600 mg of elemental calcium by mouth 2 (two) times daily with a meal.   cefUROXime 250 MG tablet Commonly known as:  CEFTIN Take 1 tablet (250 mg total) by mouth 2 (two)  times daily with a meal for 2 days.   citalopram 40 MG tablet Commonly known as:  CELEXA Take 40 mg by mouth daily.   docusate sodium 100 MG capsule Commonly known as:  COLACE Take 200 mg by mouth at bedtime.   furosemide 20 MG tablet Commonly known as:  LASIX Take 20 mg by mouth daily.   gabapentin 100 MG capsule Commonly known as:  NEURONTIN Take 200 mg by mouth 2 (two) times daily.  glipiZIDE 10 MG tablet Commonly known as:  GLUCOTROL Take 10 mg by mouth daily before breakfast.   ipratropium-albuterol 0.5-2.5 (3) MG/3ML Soln Commonly known as:  DUONEB Take 3 mLs by nebulization 3 (three) times daily.   levothyroxine 125 MCG tablet Commonly known as:  SYNTHROID, LEVOTHROID Take 125 mcg by mouth daily before breakfast.   LORazepam 0.5 MG tablet Commonly known as:  ATIVAN Take 0.5 mg by mouth every 4 (four) hours as needed for anxiety.   metFORMIN 500 MG tablet Commonly known as:  GLUCOPHAGE Take 1,000 mg by mouth 2 (two) times daily with a meal.   metoprolol tartrate 25 MG tablet Commonly known as:  LOPRESSOR Take 0.5 tablets (12.5 mg total) by mouth 2 (two) times daily. What changed:  additional instructions   nystatin powder Generic drug:  nystatin Apply topically 2 (two) times daily as needed. For fungal growth   omeprazole 20 MG capsule Commonly known as:  PRILOSEC Take 20 mg by mouth daily.   potassium chloride 10 MEQ tablet Commonly known as:  K-DUR Take 10 mEq by mouth daily.   predniSONE 10 MG tablet Commonly known as:  DELTASONE Label  & dispense according to the schedule below. 5 Pills PO for 1 day then, 4 Pills PO for 1 day, 3 Pills PO for 1 day, 2 Pills PO for 1 day, 1 Pill PO for 1 days then STOP.   prochlorperazine 10 MG tablet Commonly known as:  COMPAZINE Take 10 mg by mouth every 6 (six) hours as needed for nausea or vomiting.   rosuvastatin 40 MG tablet Commonly known as:  CRESTOR Take 40 mg by mouth every  evening.   tiotropium 18 MCG inhalation capsule Commonly known as:  SPIRIVA Place 18 mcg into inhaler and inhale daily.     Relevant Imaging Results:  Relevant Lab Results:   Additional Information    Candace  Rinaldo Ratel, [address]

## 2018-09-02 NOTE — Evaluation (Signed)
Physical Therapy Evaluation Patient Details Name: Sheila Hayes MRN: [id number] DOB: February 09, 1941 Today's Date: 09/02/2018   History of Present Illness  Pt from [facility] with c/o unwitnessed  2 falls last night. Per staff pt is lethargic, at baseline pt A&OX4. VSS . Pt on 3L [place] chronic. PMH of COPD, HTN, HLD, hypothyroidism, obesity, pulm HTN, CHF.     Clinical Impression  Patient alert, oriented to self, and date, disoriented to situation and place. No complaints of pain at this time. Patient unable to provide PLOF information, gathered from chart that patient is from assisted living. Pt did state she was independent with bathing, dressing, ambulates with a walker, and goes to the dining hall for meals.   Patient demonstrated bed mobility with minAx1 and verbal cues for sequencing. Patient able to sit EOB for several minutes, patient orthostatic (see vitals flowsheet for details) with complaints of lightheadedness. Assisted back to supine, and performing bed changing/pt care with nursing tech. Patient with CGA and verbal cues for rolling in bed. At end of session BP 93/62 patient in NAD, RN notified and aware of BP with mobility.  Overall the patient demonstrated deficits (see "PT Problem List") that impede the patient's functional abilities, safety, and mobility and would benefit from skilled PT intervention.      Follow Up Recommendations SNF    Equipment Recommendations  Other (comment)(TBD)    Recommendations for Other Services       Precautions / Restrictions Precautions Precautions: Fall Precaution Comments: watch BP Restrictions Weight Bearing Restrictions: No      Mobility  Bed Mobility Overal bed mobility: Needs Assistance Bed Mobility: Supine to Sit;Sit to Supine     Supine to sit: Min assist Sit to supine: Min assist      Transfers                 General transfer comment: deferred due to BP  Ambulation/Gait             General Gait Details:  deferred due to BP  Stairs            Wheelchair Mobility    Modified Rankin (Stroke Patients Only)       Balance Overall balance assessment: Needs assistance Sitting-balance support: Feet supported;Single extremity supported Sitting balance-[name] Scale: Poor Sitting balance - Comments: Patient instances of fair balance seated EOB (no UE support) but patient preferred UE support at least unilaterally throughout.                                     Pertinent Vitals/Pain Pain Assessment: No/denies pain    Home Living Family/patient expects to be discharged to:: Assisted living                 Additional Comments: [facility]    Prior Function Level of Independence: Needs assistance   Gait / Transfers Assistance Needed: reports using a walker to ambulate to dining hall  ADL's / Homemaking Assistance Needed: patient reported that she is able to dress and bathe independently.   Comments: Patient reported that she has fallen 3-4x in the last month, questionable historian     Hand Dominance   Dominant Hand: Right    Extremity/Trunk Assessment   Upper Extremity Assessment Upper Extremity Assessment: Generalized weakness;Defer to OT evaluation    Lower Extremity Assessment Lower Extremity Assessment: Generalized weakness       Communication  Communication: No difficulties  Cognition Arousal/Alertness: Awake/alert Behavior During Therapy: WFL for tasks assessed/performed Overall Cognitive Status: No family/caregiver present to determine baseline cognitive functioning                                 General Comments: pt oriented to self, time, disoriented to place and situation      General Comments      Exercises Other Exercises Other Exercises: Patient able to sit EOB to assess BP response for several minutes with supervision.   Assessment/Plan    PT Assessment Patient needs continued PT services  PT Problem  List Decreased strength;Cardiopulmonary status limiting activity;Decreased range of motion;Decreased activity tolerance;Decreased balance;Decreased mobility;Decreased safety awareness;Decreased knowledge of use of DME       PT Treatment Interventions DME instruction;Therapeutic exercise;Gait training;Balance training;Stair training;Neuromuscular re-education;Functional mobility training;Therapeutic activities;Patient/family education    PT Goals (Current goals can be found in the Care Plan section)  Acute Rehab PT Goals Patient Stated Goal: Pt wants to walk PT Goal Formulation: With patient Time For Goal Achievement: 09/16/18 Potential to Achieve Goals: Good    Frequency Min 2X/week   Barriers to discharge Decreased caregiver support      Co-evaluation               AM-PAC PT "6 Clicks" Mobility  Outcome Measure Help needed turning from your back to your side while in a flat bed without using bedrails?: A Little Help needed moving from lying on your back to sitting on the side of a flat bed without using bedrails?: A Little Help needed moving to and from a bed to a chair (including a wheelchair)?: A Lot Help needed standing up from a chair using your arms (e.g., wheelchair or bedside chair)?: A Lot Help needed to walk in hospital room?: Total Help needed climbing 3-5 steps with a railing? : Total 6 Click Score: 12    End of Session   Activity Tolerance: Treatment limited secondary to medical complications (Comment);Other (comment)(orthostatic BP) Patient left: with call bell/phone within reach;with bed alarm set;in bed Nurse Communication: Mobility status;Other (comment)(orthostatic BP) PT Visit Diagnosis: Other abnormalities of gait and mobility (R26.89);Muscle weakness (generalized) (M62.81);Difficulty in walking, not elsewhere classified (R26.2)    Time: 1133-1208 PT Time Calculation (min) (ACUTE ONLY): 35 min   Charges:   PT Evaluation $PT Eval Moderate  Complexity: 1 Mod PT Treatments $Therapeutic Activity: 8-22 mins        Olga Coaster PT, DPT 12:59 PM,09/02/18 [phone]

## 2018-09-02 NOTE — Plan of Care (Signed)
  Problem: Education: Goal: Knowledge of General Education information will improve Description Including pain rating scale, medication(s)/side effects and non-pharmacologic comfort measures Outcome: Progressing   Problem: Urinary Elimination: Goal: Signs and symptoms of infection will decrease Outcome: Progressing   Problem: Safety: Goal: Ability to remain free from injury will improve Outcome: Progressing

## 2018-09-02 NOTE — TOC Initial Note (Signed)
Transition of Care [facility]) - Initial/Assessment Note    Patient Details  Name: Sheila Hayes MRN: [id number] Date of Birth: [birth year]  [facility]) CM/SW Contact:    Ruthe Mannan, LCSWA Phone Number: 09/02/2018, 12:24 PM  Clinical Narrative:   Patient is a long term resident at [facility] facility. She is also followed by [facility]. Patient would like to return to facility when medically ready for discharge.                 Expected Discharge Plan: Assisted Living Barriers to Discharge: Continued Medical Work up   Patient Goals and CMS Choice Patient states their goals for this hospitalization and ongoing recovery are:: To return "home"  CMS Medicare.gov Compare Post Acute Care list provided to:: Patient Represenative (must comment)(Sheila Hayes ) Choice offered to / list presented to : Patient  Expected Discharge Plan and Services Expected Discharge Plan: Assisted Living     Living arrangements for the past 2 months: Assisted Living Facility                          Prior Living Arrangements/Services Living arrangements for the past 2 months: Assisted Living Facility Lives with:: Facility Resident Patient language and need for interpreter reviewed:: Yes Do you feel safe going back to the place where you live?: Yes      Need for Family Participation in Patient Care: Yes (Comment) Care giver support system in place?: Yes (comment) Current home services: Hospice([facility] ) Criminal Activity/Legal Involvement Pertinent to Current Situation/Hospitalization: No - Comment as needed  Activities of Daily Living Home Assistive Devices/Equipment: Environmental consultant (specify type), Wheelchair ADL Screening (condition at time of admission) Patient's cognitive ability adequate to safely complete daily activities?: No Is the patient deaf or have difficulty hearing?: Yes Does the patient have difficulty seeing, even  when wearing glasses/contacts?: No Does the patient have difficulty concentrating, remembering, or making decisions?: Yes Patient able to express need for assistance with ADLs?: Yes Does the patient have difficulty dressing or bathing?: Yes Independently performs ADLs?: No Communication: Independent Dressing (OT): Needs assistance Is this a change from baseline?: Pre-admission baseline Grooming: Needs assistance Is this a change from baseline?: Pre-admission baseline Feeding: Independent Bathing: Needs assistance Is this a change from baseline?: Pre-admission baseline Toileting: Needs assistance Is this a change from baseline?: Pre-admission baseline In/Out Bed: Needs assistance Is this a change from baseline?: Pre-admission baseline Walks in Home: Independent with device (comment) Does the patient have difficulty walking or climbing stairs?: Yes Weakness of Legs: Both Weakness of Arms/Hands: None  Permission Sought/Granted Permission sought to share information with : Case Manager, Family Supports, Oceanographer granted to share information with : Yes, Verbal Permission Granted  Share Information with NAME: Hayes- Sheila Hayes   Permission granted to share info w AGENCY: [facility]         Emotional Assessment Appearance:: Appears stated age Attitude/Demeanor/Rapport: Lethargic Affect (typically observed): Accepting Orientation: : Oriented to Self, Oriented to Place Alcohol / Substance Use: Not Applicable Psych Involvement: No (comment)  Admission diagnosis:  Lower urinary tract infectious disease [N39.0] Fall, initial encounter [W19.XXXA] Altered mental status, unspecified altered mental status type [R41.82] Patient Active Problem List   Diagnosis Date Noted  . MRSA bacteremia 09/02/2018  . Acute respiratory failure with hypercapnia (HCC) 06/14/2018  . Acute on chronic diastolic CHF (congestive heart failure) (HCC) 02/23/2018   .  Altered mental status   . Urinary tract infection without hematuria   . Acute respiratory failure with hypoxia and hypercapnia (HCC) 01/30/2018  . Near syncope 10/09/2017  . HTN (hypertension) 10/09/2017  . Diabetes (HCC) 10/09/2017  . HLD (hyperlipidemia) 10/09/2017  . Chronic diastolic CHF (congestive heart failure) (HCC) 10/09/2017  . Hypothyroidism 10/09/2017  . COPD exacerbation (HCC) 05/13/2016   PCP:  [employer]:   [facility] - [place], [place] - [address] [place] [address] Phone: [phone] Fax: [phone]     Social Determinants of Health (SDOH) Interventions    Readmission Risk Interventions 30 Day Unplanned Readmission Risk Score     ED to Hosp-Admission (Current) from 09/01/2018 in [facility] (1C)  30 Day Unplanned Readmission Risk Score (%)  28 Filed at 09/02/2018 1200     This score is the patient's risk of an unplanned readmission within 30 days of being discharged (0 -100%). The score is based on dignosis, age, lab data, medications, orders, and past utilization.   Low:  0-14.9   Medium: 15-21.9   High: 22-29.9   Extreme: 30 and above       Readmission Risk Prevention Plan 09/02/2018 02/28/2018  Transportation Screening Complete Complete  PCP or Specialist Appt within 3-5 Days Complete (No Data)  Home Care Screening - Complete  HRI or Home Care Consult Complete Complete  Social Work Consult for Recovery Care Planning/Counseling Not Complete Not Complete  SW consult not completed comments NA -  Palliative Care Screening Complete Not Complete  Medication Review Oceanographer) Complete Complete  Some recent data might be hidden

## 2018-09-02 NOTE — Progress Notes (Signed)
PHARMACY - PHYSICIAN COMMUNICATION CRITICAL VALUE ALERT - BLOOD CULTURE IDENTIFICATION (BCID)  Sheila Hayes is an 78 y.o. female who presented to [facility] on 09/01/2018 with a chief complaint of fall s/t UTI  Assessment:  UA showing hgb w/ protein, CXR cardiomegaly, pulm htn no overt edema, patient was here in 12/19 [facility],  2/4 [facility] BCID Staph mec A +   Name of physician (or Provider) Contacted: Barbaraann Rondo  Current antibiotics: Ceftriaxone  Changes to prescribed antibiotics recommended:  Recommendations accepted by provider -- will add vancomycin; vancomycin pharmacy to dose ordered. Will give patient vanc 1.75g IV load then: Vancomycin 750 mg IV Q 12 hrs. Goal AUC 400-550. Expected AUC: 478.9 SCr used: 0.7 mg/dL  Results for orders placed or performed during the hospital encounter of 09/01/18  Blood Culture ID Panel (Reflexed) (Collected: 09/01/2018  7:38 AM)  Result Value Ref Range   Enterococcus species NOT DETECTED NOT DETECTED   Listeria monocytogenes NOT DETECTED NOT DETECTED   Staphylococcus species DETECTED (A) NOT DETECTED   Staphylococcus aureus (BCID) NOT DETECTED NOT DETECTED   Methicillin resistance DETECTED (A) NOT DETECTED   Streptococcus species NOT DETECTED NOT DETECTED   Streptococcus agalactiae NOT DETECTED NOT DETECTED   Streptococcus pneumoniae NOT DETECTED NOT DETECTED   Streptococcus pyogenes NOT DETECTED NOT DETECTED   Acinetobacter baumannii NOT DETECTED NOT DETECTED   Enterobacteriaceae species NOT DETECTED NOT DETECTED   Enterobacter cloacae complex NOT DETECTED NOT DETECTED   Escherichia coli NOT DETECTED NOT DETECTED   Klebsiella oxytoca NOT DETECTED NOT DETECTED   Klebsiella pneumoniae NOT DETECTED NOT DETECTED   Proteus species NOT DETECTED NOT DETECTED   Serratia marcescens NOT DETECTED NOT DETECTED   Haemophilus influenzae NOT DETECTED NOT DETECTED   Neisseria meningitidis NOT DETECTED NOT DETECTED   Pseudomonas aeruginosa NOT  DETECTED NOT DETECTED   Candida albicans NOT DETECTED NOT DETECTED   Candida glabrata NOT DETECTED NOT DETECTED   Candida krusei NOT DETECTED NOT DETECTED   Candida parapsilosis NOT DETECTED NOT DETECTED   Candida tropicalis NOT DETECTED NOT DETECTED   Thomasene Ripple, PharmD, BCPS Clinical Pharmacist 09/02/2018  3:47 AM

## 2018-09-03 ENCOUNTER — Inpatient Hospital Stay

## 2018-09-03 LAB — CULTURE, BLOOD (ROUTINE X 2): Special Requests: ADEQUATE

## 2018-09-03 LAB — BASIC METABOLIC PANEL
Anion gap: 4 — ABNORMAL LOW (ref 5–15)
BUN: 11 mg/dL (ref 8–23)
CO2: 44 mmol/L — ABNORMAL HIGH (ref 22–32)
CREATININE: 0.71 mg/dL (ref 0.44–1.00)
Calcium: 8.5 mg/dL — ABNORMAL LOW (ref 8.9–10.3)
Chloride: 93 mmol/L — ABNORMAL LOW (ref 98–111)
GFR calc Af Amer: >60 mL/min (ref >60)
GFR calc non Af Amer: >60 mL/min (ref >60)
Glucose, Bld: 168 mg/dL — ABNORMAL HIGH (ref 70–99)
Potassium: 3.6 mmol/L (ref 3.5–5.1)
Sodium: 141 mmol/L (ref 135–145)

## 2018-09-03 LAB — GLUCOSE, CAPILLARY
Glucose-Capillary: 143 mg/dL — ABNORMAL HIGH (ref 70–99)
Glucose-Capillary: 246 mg/dL — ABNORMAL HIGH (ref 70–99)
Glucose-Capillary: 262 mg/dL — ABNORMAL HIGH (ref 70–99)
Glucose-Capillary: 259 mg/dL — ABNORMAL HIGH (ref 70–99)

## 2018-09-03 MED ORDER — IPRATROPIUM-ALBUTEROL 0.5-2.5 (3) MG/3ML IN SOLN
3.0000 mL | Freq: Four times a day (QID) | RESPIRATORY_TRACT | Status: DC
  Administered 2018-09-03 – 2018-09-04 (×5): 3 mL via RESPIRATORY_TRACT
  Filled 2018-09-03 (×5): qty 3

## 2018-09-03 MED ORDER — SODIUM CHLORIDE 0.9 % IV SOLN
INTRAVENOUS | Status: DC | PRN
  Administered 2018-09-03: 11:00:00 1000 mL via INTRAVENOUS
  Administered 2018-09-04: 500 mL via INTRAVENOUS

## 2018-09-03 MED ORDER — METHYLPREDNISOLONE SODIUM SUCC 40 MG IJ SOLR
40.0000 mg | Freq: Two times a day (BID) | INTRAMUSCULAR | Status: DC
  Administered 2018-09-03 – 2018-09-04 (×3): 40 mg via INTRAVENOUS
  Filled 2018-09-03 (×3): qty 1

## 2018-09-03 MED ORDER — BUDESONIDE 0.5 MG/2ML IN SUSP
0.5000 mg | Freq: Two times a day (BID) | RESPIRATORY_TRACT | Status: DC
  Administered 2018-09-03 – 2018-09-04 (×3): 0.5 mg via RESPIRATORY_TRACT
  Filled 2018-09-03 (×3): qty 2

## 2018-09-03 MED ORDER — ORAL CARE MOUTH RINSE
15.0000 mL | Freq: Two times a day (BID) | OROMUCOSAL | Status: DC
  Administered 2018-09-03 – 2018-09-04 (×3): 15 mL via OROMUCOSAL

## 2018-09-03 NOTE — Progress Notes (Signed)
[employer] - [place] at [facility]     PATIENT NAME: Sheila Hayes    MR#:  [id number]  DATE OF BIRTH:  17-Jun-1941  SUBJECTIVE:   Patient admitted to the hospital secondary to altered mental status.  Mental status improved since admission.  Patient having some wheezing and bronchospasm today.  REVIEW OF SYSTEMS:    Review of Systems  Constitutional: Negative for chills and fever.  HENT: Negative for congestion and tinnitus.   Eyes: Negative for blurred vision and double vision.  Respiratory: Positive for shortness of breath and wheezing. Negative for cough.   Cardiovascular: Negative for chest pain, orthopnea and PND.  Gastrointestinal: Negative for abdominal pain, diarrhea, nausea and vomiting.  Genitourinary: Negative for dysuria and hematuria.  Neurological: Positive for weakness (generalized). Negative for dizziness, sensory change and focal weakness.  All other systems reviewed and are negative.   Nutrition: Heart Healthy/Carb modified Tolerating Diet: yes Tolerating PT: Await Eval.   DRUG ALLERGIES:  No Known Allergies  VITALS:  Blood pressure 122/64, pulse 85, temperature 98.9 F (37.2 C), temperature source Oral, resp. rate 18, weight 105.1 kg, SpO2 92 %.  PHYSICAL EXAMINATION:   Physical Exam  GENERAL:  78 y.o.-year-old patient lying in bed in no acute distress.  EYES: Pupils equal, round, reactive to light and accommodation. No scleral icterus. Extraocular muscles intact.  HEENT: Head atraumatic, normocephalic. Oropharynx and nasopharynx clear.  NECK:  Supple, no jugular venous distention. No thyroid enlargement, no tenderness.  LUNGS: Good a/e b/l, diffuse wheezing b/l, No rales, rhonchi. No use of accessory muscles of respiration.  CARDIOVASCULAR: S1, S2 normal. No murmurs, rubs, or gallops.  ABDOMEN: Soft, nontender, nondistended. Bowel sounds present. No organomegaly or mass.  EXTREMITIES: No cyanosis, clubbing or edema b/l.     NEUROLOGIC: Cranial nerves II through XII are intact. No focal Motor or sensory deficits b/l.   PSYCHIATRIC: The patient is alert and oriented x 3.  SKIN: No obvious rash, lesion, or ulcer.    LABORATORY PANEL:   CBC Recent Labs  Lab 09/02/18 0254  WBC 5.4  HGB 8.9*  HCT 31.9*  PLT 179   ------------------------------------------------------------------------------------------------------------------  Chemistries  Recent Labs  Lab 09/01/18 0738  09/03/18 0441  NA 137   < > 141  K 3.9   < > 3.6  CL 89*   < > 93*  CO2 39*   < > 44*  GLUCOSE 191*   < > 168*  BUN 13   < > 11  CREATININE 0.70   < > 0.71  CALCIUM 8.7*   < > 8.5*  AST 17  --   --   ALT 8  --   --   ALKPHOS 39  --   --   BILITOT 1.2  --   --    < > = values in this interval not displayed.   ------------------------------------------------------------------------------------------------------------------  Cardiac Enzymes Recent Labs  Lab 09/01/18 0738  TROPONINI <0.03   ------------------------------------------------------------------------------------------------------------------  RADIOLOGY:  Dg Chest Port 1 View  Result Date: 09/03/2018 CLINICAL DATA:  Wheezing EXAM: PORTABLE CHEST 1 VIEW COMPARISON:  09/01/2018 FINDINGS: Heart size upper limits of normal. Aortic atherosclerosis. Widespread bronchial thickening pattern suggesting asthma or bronchitis. No consolidation or collapse. There could be an element of coexistent venous hypertension. IMPRESSION: Probable diffuse bronchitis pattern. There could be an element of venous hypertension as well. Electronically Signed   By: Paulina Fusi M.D.   On: 09/03/2018 11:16     ASSESSMENT AND  PLAN:   78 year old female with past medical history of essential hypertension, hyperlipidemia, hypothyroidism, obesity, diabetes, COPD, chronic diastolic CHF who presents to the hospital after recurrent falls and altered mental status.  1. Altered mental  status-metabolic encephalopathy secondary to suspected UTI. -Much improved since admission.  Urine cultures are growing strep bovis which is likely contaminant. -Continue IV ceftriaxone.  [place] improved.  CT head negative on admission.  2. Recurrent falls-  due to underlying deconditioning/UTI.   - My physical therapy and the recommend short-term rehab.  Patient is actually followed by hospice, and will arrange discharge to her assisted living with palliative care/hospice to follow.  3. COPD-patient having a mild acute exacerbation this morning as she was having some wheezing and bronchospasm.  Chest x-ray showing more bronchitic pattern. - We will start the patient on IV steroids, will add some Pulmicort nebs, scheduled duo nebs.  4. Hypothyroidism-continue Synthroid.   5. History of chronic diastolic CHF-clinically patient is not in congestive heart failure. -Continue Lasix, metoprolol.  6. GERD-continue Protonix.  7. Hyperlipidemia-continue Crestor.  8. Depression-continue Celexa.    All the records are reviewed and case discussed with Care Management/Social Worker. Management plans discussed with the patient, family and they are in agreement.  CODE STATUS: DNR  DVT Prophylaxis: Lovenox  TOTAL TIME TAKING CARE OF THIS PATIENT: 30 minutes.   POSSIBLE D/C IN 1-2 DAYS, DEPENDING ON CLINICAL CONDITION.   Houston Siren M.D on 09/03/2018 at 1:45 PM  Between 7am to 6pm - Pager - [phone]  After 6pm go to [email or web address] - Therapist, nutritional Hospitalists  Office  [phone]  CC: Primary care physician; [facility]

## 2018-09-03 NOTE — Plan of Care (Signed)
  Problem: Urinary Elimination: Goal: Signs and symptoms of infection will decrease Outcome: Completed/Met   Problem: Safety: Goal: Ability to remain free from injury will improve Outcome: Completed/Met

## 2018-09-04 LAB — GLUCOSE, CAPILLARY
GLUCOSE-CAPILLARY: 227 mg/dL — AB (ref 70–99)
Glucose-Capillary: 263 mg/dL — ABNORMAL HIGH (ref 70–99)
Glucose-Capillary: 287 mg/dL — ABNORMAL HIGH (ref 70–99)

## 2018-09-04 MED ORDER — CEFUROXIME AXETIL 250 MG PO TABS: 250.0000 mg | ORAL_TABLET | Freq: Two times a day (BID) | ORAL | 0 refills | Status: AC

## 2018-09-04 MED ORDER — PREDNISONE 10 MG PO TABS: ORAL_TABLET | ORAL | 0 refills | Status: DC

## 2018-09-04 NOTE — Discharge Summary (Addendum)
[employer] - [place] at [facility]   PATIENT NAME: Sheila Hayes    MR#:  [id number]  DATE OF BIRTH:  1940-11-06  DATE OF ADMISSION:  09/01/2018 ADMITTING PHYSICIAN: Houston Siren, MD  DATE OF DISCHARGE: 09/04/2018  PRIMARY CARE PHYSICIAN: [facility]    ADMISSION DIAGNOSIS:  Lower urinary tract infectious disease [N39.0] Fall, initial encounter [W19.XXXA] Altered mental status, unspecified altered mental status type [R41.82]  DISCHARGE DIAGNOSIS:  Active Problems:   Altered mental status  COPD Exacerbation Recurrent falls.    SECONDARY DIAGNOSIS:   Past Medical History:  Diagnosis Date  . Chronic diastolic congestive heart failure (HCC)   . COPD (chronic obstructive pulmonary disease) (HCC)   . Diabetes mellitus without complication (HCC)   . Hyperlipidemia   . Hypertension   . Hypothyroidism   . Morbid obesity (HCC)   . Pulmonary hypertension [facility])     HOSPITAL COURSE:   78 year old female with past medical history of essential hypertension, hyperlipidemia, hypothyroidism, obesity, diabetes, COPD, chronic diastolic CHF who presents to the hospital after recurrent falls and altered mental status.  1. Altered mental status-metabolic encephalopathy secondary to suspected UTI. Patient was treated with IV ceftriaxone for her UTI.  Urine cultures are positive for strep bovis which is likely a contaminant.  Patient has clinically improved and mental status back to baseline therefore being discharged back to her assisted living.  2. Recurrent falls- due to underlying deconditioning/UTI.   -Seen by physical therapy and they recommend short-term rehab.  Patient is actually followed by hospice services and patient to be discharged back to her assisted living with palliative care/hospice to continue to follow. - UTI has been adequately treated.  3. COPD- she had a mild COPD exacerbation while in the hospital.  Chest x-ray was suggestive  of more bronchitis rather than pneumonia. -Patient was treated with some IV steroids scheduled duo nebs Pulmicort nebs.  Her wheezing and bronchospasm has improved.   She will be discharged on prednisone taper, continue her inhalers and her duo nebs as needed as stated below.  4. Hypothyroidism- she will continue Synthroid.   5. History of chronic diastolic CHF-clinically patient was not in congestive heart failure while in the hospital. - she will Continue Lasix, metoprolol.  6. GERD- she will continue her Omeprazole.   7. Hyperlipidemia- she will continue Crestor.  8. Depression- she will cont. Celexa.    DISCHARGE CONDITIONS:   Stable.   CONSULTS OBTAINED:    DRUG ALLERGIES:  No Known Allergies  DISCHARGE MEDICATIONS:   Allergies as of 09/04/2018   No Known Allergies     Medication List    TAKE these medications   acetaminophen 500 MG tablet Commonly known as:  TYLENOL Take 500 mg by mouth every 6 (six) hours as needed for headache.   albuterol 108 (90 Base) MCG/ACT inhaler Commonly known as:  PROVENTIL HFA;VENTOLIN HFA Inhale 2 puffs into the lungs every 6 (six) hours as needed for wheezing or shortness of breath.   budesonide-formoterol 80-4.5 MCG/ACT inhaler Commonly known as:  SYMBICORT Inhale 2 puffs into the lungs 2 (two) times daily.   calcium carbonate 1500 (600 Ca) MG Tabs tablet Commonly known as:  OSCAL Take 600 mg of elemental calcium by mouth 2 (two) times daily with a meal.   cefUROXime 250 MG tablet Commonly known as:  CEFTIN Take 1 tablet (250 mg total) by mouth 2 (two) times daily with a meal for 2 days.   citalopram 40 MG  tablet Commonly known as:  CELEXA Take 40 mg by mouth daily.   docusate sodium 100 MG capsule Commonly known as:  COLACE Take 200 mg by mouth at bedtime.   furosemide 20 MG tablet Commonly known as:  LASIX Take 20 mg by mouth daily.   gabapentin 100 MG capsule Commonly known as:  NEURONTIN Take 200  mg by mouth 2 (two) times daily.   glipiZIDE 10 MG tablet Commonly known as:  GLUCOTROL Take 10 mg by mouth daily before breakfast.   ipratropium-albuterol 0.5-2.5 (3) MG/3ML Soln Commonly known as:  DUONEB Take 3 mLs by nebulization 3 (three) times daily.   levothyroxine 125 MCG tablet Commonly known as:  SYNTHROID, LEVOTHROID Take 125 mcg by mouth daily before breakfast.   LORazepam 0.5 MG tablet Commonly known as:  ATIVAN Take 0.5 mg by mouth every 4 (four) hours as needed for anxiety.   metFORMIN 500 MG tablet Commonly known as:  GLUCOPHAGE Take 1,000 mg by mouth 2 (two) times daily with a meal.   metoprolol tartrate 25 MG tablet Commonly known as:  LOPRESSOR Take 0.5 tablets (12.5 mg total) by mouth 2 (two) times daily. What changed:  additional instructions   nystatin powder Generic drug:  nystatin Apply topically 2 (two) times daily as needed. For fungal growth   omeprazole 20 MG capsule Commonly known as:  PRILOSEC Take 20 mg by mouth daily.   potassium chloride 10 MEQ tablet Commonly known as:  K-DUR Take 10 mEq by mouth daily.   predniSONE 10 MG tablet Commonly known as:  DELTASONE Label  & dispense according to the schedule below. 5 Pills PO for 1 day then, 4 Pills PO for 1 day, 3 Pills PO for 1 day, 2 Pills PO for 1 day, 1 Pill PO for 1 days then STOP.   prochlorperazine 10 MG tablet Commonly known as:  COMPAZINE Take 10 mg by mouth every 6 (six) hours as needed for nausea or vomiting.   rosuvastatin 40 MG tablet Commonly known as:  CRESTOR Take 40 mg by mouth every evening.   tiotropium 18 MCG inhalation capsule Commonly known as:  SPIRIVA Place 18 mcg into inhaler and inhale daily.         DISCHARGE INSTRUCTIONS:   DIET:  Cardiac diet and Diabetic diet  DISCHARGE CONDITION:  Stable  ACTIVITY:  Activity as tolerated  OXYGEN:  Home Oxygen: Yes.     Oxygen Delivery: 2 liters/min via Patient connected to nasal cannula  oxygen  DISCHARGE LOCATION:  Assisted Living with Palliative to follow.    If you experience worsening of your admission symptoms, develop shortness of breath, life threatening emergency, suicidal or homicidal thoughts you must seek medical attention immediately by calling 911 or calling your MD immediately  if symptoms less severe.  You Must read complete instructions/literature along with all the possible adverse reactions/side effects for all the Medicines you take and that have been prescribed to you. Take any new Medicines after you have completely understood and accpet all the possible adverse reactions/side effects.   Please note  You were cared for by a hospitalist during your hospital stay. If you have any questions about your discharge medications or the care you received while you were in the hospital after you are discharged, you can call the unit and asked to speak with the hospitalist on call if the hospitalist that took care of you is not available. Once you are discharged, your primary care physician will handle any further  medical issues. Please note that NO REFILLS for any discharge medications will be authorized once you are discharged, as it is imperative that you return to your primary care physician (or establish a relationship with a primary care physician if you do not have one) for your aftercare needs so that they can reassess your need for medications and monitor your lab values.     Today   Status back to baseline.  Shortness of breath wheezing improved since yesterday.  Will discharge back to assisted living today.  VITAL SIGNS:  Blood pressure (!) 108/58, pulse 81, temperature 98.2 F (36.8 C), temperature source Oral, resp. rate 14, weight 105.1 kg, SpO2 92 %.  I/O:    Intake/Output Summary (Last 24 hours) at 09/04/2018 1106 Last data filed at 09/04/2018 0942 Gross per 24 hour  Intake 174.74 ml  Output 2500 ml  Net -2325.26 ml    PHYSICAL EXAMINATION:    GENERAL:  78 y.o.-year-old patient lying in bed in no acute distress.  EYES: Pupils equal, round, reactive to light and accommodation. No scleral icterus. Extraocular muscles intact.  HEENT: Head atraumatic, normocephalic. Oropharynx and nasopharynx clear.  NECK:  Supple, no jugular venous distention. No thyroid enlargement, no tenderness.  LUNGS: Good a/e b/l, minimal end-exp. Wheezing b/l, No rales, rhonchi. No use of accessory muscles of respiration.  CARDIOVASCULAR: S1, S2 normal. No murmurs, rubs, or gallops.  ABDOMEN: Soft, nontender, nondistended. Bowel sounds present. No organomegaly or mass.  EXTREMITIES: No cyanosis, clubbing or edema b/l.    NEUROLOGIC: Cranial nerves II through XII are intact. No focal Motor or sensory deficits b/l. Globally weak.   PSYCHIATRIC: The patient is alert and oriented x 3.  SKIN: No obvious rash, lesion, or ulcer.  DATA REVIEW:   CBC Recent Labs  Lab 09/02/18 0254  WBC 5.4  HGB 8.9*  HCT 31.9*  PLT 179    Chemistries  Recent Labs  Lab 09/01/18 0738  09/03/18 0441  NA 137   < > 141  K 3.9   < > 3.6  CL 89*   < > 93*  CO2 39*   < > 44*  GLUCOSE 191*   < > 168*  BUN 13   < > 11  CREATININE 0.70   < > 0.71  CALCIUM 8.7*   < > 8.5*  AST 17  --   --   ALT 8  --   --   ALKPHOS 39  --   --   BILITOT 1.2  --   --    < > = values in this interval not displayed.    Cardiac Enzymes Recent Labs  Lab 09/01/18 0738  TROPONINI <0.03    Microbiology Results  Results for orders placed or performed during the hospital encounter of 09/01/18  Blood culture (routine x 2)     Status: Abnormal   Collection Time: 09/01/18  7:38 AM  Result Value Ref Range Status   Specimen Description   Final    BLOOD RIGHT [facility] Performed at [facility], [address] [place], [place] [address]    Special Requests   Final    BOTTLES DRAWN AEROBIC AND ANAEROBIC Blood Culture adequate volume Performed at [facility], [address] [place], [place] [address]    Culture  Setup Time   Final    GRAM POSITIVE COCCI IN BOTH AEROBIC AND ANAEROBIC BOTTLES CRITICAL RESULT CALLED TO, READ BACK BY AND VERIFIED WITH: DAVID BESANTI 09/02/2018 0320 REC  Culture (A)  Final    STAPHYLOCOCCUS SPECIES (COAGULASE NEGATIVE) THE SIGNIFICANCE OF ISOLATING THIS ORGANISM FROM A SINGLE SET OF BLOOD CULTURES WHEN MULTIPLE SETS ARE DRAWN IS UNCERTAIN. PLEASE NOTIFY THE MICROBIOLOGY DEPARTMENT WITHIN ONE WEEK IF SPECIATION AND SENSITIVITIES ARE REQUIRED. Performed at [facility], [address] [place], [place] [address]    Report Status 09/03/2018 FINAL  Final  Urine Culture     Status: Abnormal   Collection Time: 09/01/18  7:38 AM  Result Value Ref Range Status   Specimen Description   Final    URINE, RANDOM Performed at [facility], [address], [place], [place] [address]    Special Requests   Final    NONE Performed at [facility], [address] [place], [place] [address]    Culture (A)  Final    >=100,000 COLONIES/mL STREPTOCOCCUS GROUP D;high probability for S.bovis   Report Status 09/02/2018 FINAL  Final  Blood Culture ID Panel (Reflexed)     Status: Abnormal   Collection Time: 09/01/18  7:38 AM  Result Value Ref Range Status   Enterococcus species NOT DETECTED NOT DETECTED Final   Listeria monocytogenes NOT DETECTED NOT DETECTED Final   Staphylococcus species DETECTED (A) NOT DETECTED Final    Comment: Methicillin (oxacillin) resistant coagulase negative staphylococcus. Possible blood culture contaminant (unless isolated from more than one blood culture draw or clinical case suggests pathogenicity). No antibiotic treatment is indicated for blood  culture contaminants. CRITICAL RESULT CALLED TO, READ BACK BY AND VERIFIED WITH: DAVID BESANTI 09/02/2018 0320 REC    Staphylococcus aureus (BCID) NOT DETECTED NOT DETECTED Final   Methicillin resistance DETECTED (A) NOT DETECTED Final    Comment:  CRITICAL RESULT CALLED TO, READ BACK BY AND VERIFIED WITH: DAVID BESANTI 09/02/2018 0320 REC    Streptococcus species NOT DETECTED NOT DETECTED Final   Streptococcus agalactiae NOT DETECTED NOT DETECTED Final   Streptococcus pneumoniae NOT DETECTED NOT DETECTED Final   Streptococcus pyogenes NOT DETECTED NOT DETECTED Final   Acinetobacter baumannii NOT DETECTED NOT DETECTED Final   Enterobacteriaceae species NOT DETECTED NOT DETECTED Final   Enterobacter cloacae complex NOT DETECTED NOT DETECTED Final   Escherichia coli NOT DETECTED NOT DETECTED Final   Klebsiella oxytoca NOT DETECTED NOT DETECTED Final   Klebsiella pneumoniae NOT DETECTED NOT DETECTED Final   Proteus species NOT DETECTED NOT DETECTED Final   Serratia marcescens NOT DETECTED NOT DETECTED Final   Haemophilus influenzae NOT DETECTED NOT DETECTED Final   Neisseria meningitidis NOT DETECTED NOT DETECTED Final   Pseudomonas aeruginosa NOT DETECTED NOT DETECTED Final   Candida albicans NOT DETECTED NOT DETECTED Final   Candida glabrata NOT DETECTED NOT DETECTED Final   Candida krusei NOT DETECTED NOT DETECTED Final   Candida parapsilosis NOT DETECTED NOT DETECTED Final   Candida tropicalis NOT DETECTED NOT DETECTED Final    Comment: Performed at [facility], [address] [place], [place] [address]  Blood culture (routine x 2)     Status: None (Preliminary result)   Collection Time: 09/01/18  7:50 AM  Result Value Ref Range Status   Specimen Description BLOOD LEFT AC  Final   Special Requests   Final    BOTTLES DRAWN AEROBIC AND ANAEROBIC Blood Culture results may not be optimal due to an excessive volume of blood received in culture bottles   Culture   Final    NO GROWTH 3 DAYS Performed at [name], [address]  [place], [place] [address]    Report Status PENDING  Incomplete    RADIOLOGY:  Dg Chest Port 1 View  Result Date: 09/03/2018 CLINICAL DATA:  Wheezing EXAM: PORTABLE CHEST 1  VIEW COMPARISON:  09/01/2018 FINDINGS: Heart size upper limits of normal. Aortic atherosclerosis. Widespread bronchial thickening pattern suggesting asthma or bronchitis. No consolidation or collapse. There could be an element of coexistent venous hypertension. IMPRESSION: Probable diffuse bronchitis pattern. There could be an element of venous hypertension as well. Electronically Signed   By: Paulina Fusi M.D.   On: 09/03/2018 11:16      Management plans discussed with the patient, family and they are in agreement.  CODE STATUS:     Code Status Orders  (From admission, onward)         Start     Ordered   09/01/18 1532  Do not attempt resuscitation (DNR)  Continuous    Question Answer Comment  In the event of cardiac or respiratory ARREST Do not call a "code blue"   In the event of cardiac or respiratory ARREST Do not perform Intubation, CPR, defibrillation or ACLS   In the event of cardiac or respiratory ARREST Use medication by any route, position, wound care, and other measures to relive pain and suffering. May use oxygen, suction and manual treatment of airway obstruction as needed for comfort.      09/01/18 1531        TOTAL TIME TAKING CARE OF THIS PATIENT: 40 minutes.    Houston Siren M.D on 09/04/2018 at 11:06 AM  Between 7am to 6pm - Pager - [phone]  After 6pm go to [email or web address] - Therapist, nutritional Hospitalists  Office  [phone]  CC: Primary care physician; [facility]

## 2018-09-04 NOTE — TOC Transition Note (Signed)
Transition of Care [facility]) - CM/SW Discharge Note   Patient Details  Name: Sheila Hayes MRN: [id number] Date of Birth: Nov 17, 1940  [facility] CM/SW Contact:  Judi Cong, LCSW Phone Number: 09/04/2018, 11:43 AM   Clinical Narrative:   The patient will discharge today to [facility] via non-emergent EMS. The CSW has contacted the patient's niece, [name], who is in agreement with this plan. Chip Boer is aware and confirmed that the patient can return, today. The CSW has sent all documentation to the facility. The CSW called [facility] to alert that the patient is discharging, today, and the CSW sent the discharge summary to them. The CSW will deliver the discharge packet at which time, the CSW will sign off.    Final next level of care: Assisted Living Barriers to Discharge: No Barriers Identified   Patient Goals and CMS Choice Patient states their goals for this hospitalization and ongoing recovery are:: "I want my breathing to get better." CMS Medicare.gov Compare Post Acute Care list provided to:: Other (Comment Required)(N/A) Choice offered to / list presented to : NA  Discharge Placement              Patient chooses bed at: (N/A) Patient to be transferred to facility by: EMS Name of family member notified: [name] (Niece)) Patient and family notified of of transfer: 09/04/18  Discharge Plan and Services  [facility]                      Social Determinants of Health (SDOH) Interventions     Readmission Risk Interventions Readmission Risk Prevention Plan 09/04/2018 09/02/2018 02/28/2018  Transportation Screening Complete Complete Complete  PCP or Specialist Appt within 3-5 Days Complete Complete (No Data)  Home Care Screening - - Complete  HRI or Home Care Consult Complete Complete Complete  Social Work Consult for Recovery Care Planning/Counseling Complete Not Complete Not Complete  SW consult not completed comments - NA -   Palliative Care Screening Complete Complete Not Complete  Medication Review Oceanographer) Complete Complete Complete  Some recent data might be hidden

## 2018-09-04 NOTE — Progress Notes (Signed)
AVS and 2 printed RX in packet for EMS

## 2018-09-06 LAB — CULTURE, BLOOD (ROUTINE X 2): CULTURE: NO GROWTH

## 2019-05-01 ENCOUNTER — Other Ambulatory Visit: Payer: Self-pay | Admitting: Internal Medicine

## 2019-05-01 ENCOUNTER — Emergency Department
Admission: EM | Admit: 2019-05-01 | Discharge: 2019-05-01 | Disposition: A | Discharge status: Other Acute Inpatient Hospital | Attending: Emergency Medicine | Admitting: Emergency Medicine

## 2019-05-01 ENCOUNTER — Other Ambulatory Visit: Payer: Self-pay

## 2019-05-01 ENCOUNTER — Emergency Department

## 2019-05-01 ENCOUNTER — Inpatient Hospital Stay (HOSPITAL_COMMUNITY)
Admission: AD | Admit: 2019-05-01 | Discharge: 2019-05-08 | DRG: 177 | Disposition: A | Discharge status: Skilled Nursing Facility | Source: Other Acute Inpatient Hospital | Attending: Family Medicine | Admitting: Family Medicine

## 2019-05-01 DIAGNOSIS — Z23 Encounter for immunization: Secondary | ICD-10-CM

## 2019-05-01 DIAGNOSIS — Z833 Family history of diabetes mellitus: Secondary | ICD-10-CM

## 2019-05-01 DIAGNOSIS — J9621 Acute and chronic respiratory failure with hypoxia: Secondary | ICD-10-CM | POA: Diagnosis present

## 2019-05-01 DIAGNOSIS — Z6833 Body mass index (BMI) 33.0-33.9, adult: Secondary | ICD-10-CM

## 2019-05-01 DIAGNOSIS — R4182 Altered mental status, unspecified: Secondary | ICD-10-CM | POA: Insufficient documentation

## 2019-05-01 DIAGNOSIS — I4891 Unspecified atrial fibrillation: Secondary | ICD-10-CM | POA: Diagnosis present

## 2019-05-01 DIAGNOSIS — R0902 Hypoxemia: Secondary | ICD-10-CM | POA: Insufficient documentation

## 2019-05-01 DIAGNOSIS — E039 Hypothyroidism, unspecified: Secondary | ICD-10-CM | POA: Diagnosis not present

## 2019-05-01 DIAGNOSIS — I272 Pulmonary hypertension, unspecified: Secondary | ICD-10-CM | POA: Diagnosis present

## 2019-05-01 DIAGNOSIS — N390 Urinary tract infection, site not specified: Secondary | ICD-10-CM | POA: Diagnosis present

## 2019-05-01 DIAGNOSIS — K5900 Constipation, unspecified: Secondary | ICD-10-CM | POA: Diagnosis not present

## 2019-05-01 DIAGNOSIS — I10 Essential (primary) hypertension: Secondary | ICD-10-CM | POA: Diagnosis present

## 2019-05-01 DIAGNOSIS — J440 Chronic obstructive pulmonary disease with acute lower respiratory infection: Secondary | ICD-10-CM | POA: Diagnosis present

## 2019-05-01 DIAGNOSIS — Z515 Encounter for palliative care: Secondary | ICD-10-CM | POA: Diagnosis present

## 2019-05-01 DIAGNOSIS — F0390 Unspecified dementia without behavioral disturbance: Secondary | ICD-10-CM | POA: Diagnosis present

## 2019-05-01 DIAGNOSIS — E785 Hyperlipidemia, unspecified: Secondary | ICD-10-CM | POA: Diagnosis not present

## 2019-05-01 DIAGNOSIS — Z7951 Long term (current) use of inhaled steroids: Secondary | ICD-10-CM

## 2019-05-01 DIAGNOSIS — U071 COVID-19: Principal | ICD-10-CM | POA: Diagnosis present

## 2019-05-01 DIAGNOSIS — E119 Type 2 diabetes mellitus without complications: Secondary | ICD-10-CM | POA: Diagnosis not present

## 2019-05-01 DIAGNOSIS — J441 Chronic obstructive pulmonary disease with (acute) exacerbation: Secondary | ICD-10-CM | POA: Diagnosis present

## 2019-05-01 DIAGNOSIS — E669 Obesity, unspecified: Secondary | ICD-10-CM | POA: Diagnosis present

## 2019-05-01 DIAGNOSIS — Z9981 Dependence on supplemental oxygen: Secondary | ICD-10-CM

## 2019-05-01 DIAGNOSIS — Z66 Do not resuscitate: Secondary | ICD-10-CM | POA: Diagnosis present

## 2019-05-01 DIAGNOSIS — Z8249 Family history of ischemic heart disease and other diseases of the circulatory system: Secondary | ICD-10-CM

## 2019-05-01 DIAGNOSIS — J9601 Acute respiratory failure with hypoxia: Secondary | ICD-10-CM | POA: Diagnosis not present

## 2019-05-01 DIAGNOSIS — I5032 Chronic diastolic (congestive) heart failure: Secondary | ICD-10-CM | POA: Diagnosis present

## 2019-05-01 DIAGNOSIS — Z87891 Personal history of nicotine dependence: Secondary | ICD-10-CM | POA: Insufficient documentation

## 2019-05-01 DIAGNOSIS — J449 Chronic obstructive pulmonary disease, unspecified: Secondary | ICD-10-CM | POA: Insufficient documentation

## 2019-05-01 DIAGNOSIS — Z7984 Long term (current) use of oral hypoglycemic drugs: Secondary | ICD-10-CM | POA: Diagnosis not present

## 2019-05-01 DIAGNOSIS — I110 Hypertensive heart disease with heart failure: Secondary | ICD-10-CM | POA: Diagnosis present

## 2019-05-01 DIAGNOSIS — J690 Pneumonitis due to inhalation of food and vomit: Secondary | ICD-10-CM | POA: Diagnosis not present

## 2019-05-01 DIAGNOSIS — J9622 Acute and chronic respiratory failure with hypercapnia: Secondary | ICD-10-CM | POA: Diagnosis present

## 2019-05-01 DIAGNOSIS — Z79899 Other long term (current) drug therapy: Secondary | ICD-10-CM | POA: Diagnosis not present

## 2019-05-01 DIAGNOSIS — J9602 Acute respiratory failure with hypercapnia: Secondary | ICD-10-CM | POA: Diagnosis not present

## 2019-05-01 DIAGNOSIS — R531 Weakness: Secondary | ICD-10-CM | POA: Diagnosis not present

## 2019-05-01 DIAGNOSIS — Z801 Family history of malignant neoplasm of trachea, bronchus and lung: Secondary | ICD-10-CM | POA: Diagnosis not present

## 2019-05-01 DIAGNOSIS — Z7901 Long term (current) use of anticoagulants: Secondary | ICD-10-CM | POA: Diagnosis not present

## 2019-05-01 DIAGNOSIS — J1289 Other viral pneumonia: Secondary | ICD-10-CM | POA: Diagnosis present

## 2019-05-01 DIAGNOSIS — Z79890 Hormone replacement therapy (postmenopausal): Secondary | ICD-10-CM

## 2019-05-01 LAB — CBC WITH DIFFERENTIAL/PLATELET
Abs Immature Granulocytes: 0.05 10*3/uL (ref 0.00–0.07)
Basophils Absolute: 0 10*3/uL (ref 0.0–0.1)
Basophils Relative: 0 %
Eosinophils Absolute: 0 10*3/uL (ref 0.0–0.5)
Eosinophils Relative: 0 %
HCT: 36.5 % (ref 36.0–46.0)
Hemoglobin: 9.8 g/dL — ABNORMAL LOW (ref 12.0–15.0)
Immature Granulocytes: 1 %
Lymphocytes Relative: 12 %
Lymphs Abs: 0.7 10*3/uL (ref 0.7–4.0)
MCH: 23 pg — ABNORMAL LOW (ref 26.0–34.0)
MCHC: 26.8 g/dL — ABNORMAL LOW (ref 30.0–36.0)
MCV: 85.7 fL (ref 80.0–100.0)
Monocytes Absolute: 0.4 10*3/uL (ref 0.1–1.0)
Monocytes Relative: 6 %
Neutro Abs: 5.1 10*3/uL (ref 1.7–7.7)
Neutrophils Relative %: 81 %
Platelets: 208 10*3/uL (ref 150–400)
RBC: 4.26 MIL/uL (ref 3.87–5.11)
RDW: 15.8 % — ABNORMAL HIGH (ref 11.5–15.5)
WBC: 6.2 10*3/uL (ref 4.0–10.5)
nRBC: 0 % (ref 0.0–0.2)

## 2019-05-01 LAB — BLOOD GAS, VENOUS
Acid-Base Excess: 14.9 mmol/L — ABNORMAL HIGH (ref 0.0–2.0)
Acid-Base Excess: 18 mmol/L — ABNORMAL HIGH (ref 0.0–2.0)
Bicarbonate: 47.6 mmol/L — ABNORMAL HIGH (ref 20.0–28.0)
Bicarbonate: 50.3 mmol/L — ABNORMAL HIGH (ref 20.0–28.0)
O2 Saturation: 84.5 %
O2 Saturation: 70.6 %
Patient temperature: 37
Patient temperature: 37
pCO2, Ven: 111 mmHg (ref 44.0–60.0)
pCO2, Ven: 107 mmHg (ref 44.0–60.0)
pH, Ven: 7.24 — ABNORMAL LOW (ref 7.250–7.430)
pH, Ven: 7.28 (ref 7.250–7.430)
pO2, Ven: 58 mmHg — ABNORMAL HIGH (ref 32.0–45.0)
pO2, Ven: 42 mmHg (ref 32.0–45.0)

## 2019-05-01 LAB — URINALYSIS, COMPLETE (UACMP) WITH MICROSCOPIC
Bilirubin Urine: NEGATIVE
Glucose, UA: NEGATIVE mg/dL
Ketones, ur: NEGATIVE mg/dL
Nitrite: POSITIVE — AB
Protein, ur: 100 mg/dL — AB
Specific Gravity, Urine: 1.021 (ref 1.005–1.030)
pH: 5 (ref 5.0–8.0)

## 2019-05-01 LAB — C-REACTIVE PROTEIN: CRP: 22.1 mg/dL — ABNORMAL HIGH (ref <1.0)

## 2019-05-01 LAB — TROPONIN I (HIGH SENSITIVITY)
Troponin I (High Sensitivity): 13 ng/L (ref <18)
Troponin I (High Sensitivity): 14 ng/L (ref <18)

## 2019-05-01 LAB — COMPREHENSIVE METABOLIC PANEL
ALT: 26 U/L (ref 0–44)
AST: 23 U/L (ref 15–41)
Albumin: 3.2 g/dL — ABNORMAL LOW (ref 3.5–5.0)
Alkaline Phosphatase: 58 U/L (ref 38–126)
Anion gap: 10 (ref 5–15)
BUN: 19 mg/dL (ref 8–23)
CO2: 41 mmol/L — ABNORMAL HIGH (ref 22–32)
Calcium: 8.8 mg/dL — ABNORMAL LOW (ref 8.9–10.3)
Chloride: 91 mmol/L — ABNORMAL LOW (ref 98–111)
Creatinine, Ser: 0.84 mg/dL (ref 0.44–1.00)
GFR calc Af Amer: >60 mL/min (ref >60)
GFR calc non Af Amer: >60 mL/min (ref >60)
Glucose, Bld: 75 mg/dL (ref 70–99)
Potassium: 3.3 mmol/L — ABNORMAL LOW (ref 3.5–5.1)
Sodium: 142 mmol/L (ref 135–145)
Total Bilirubin: 0.5 mg/dL (ref 0.3–1.2)
Total Protein: 7.1 g/dL (ref 6.5–8.1)

## 2019-05-01 LAB — FIBRINOGEN: Fibrinogen: >750 mg/dL — ABNORMAL HIGH (ref 210–475)

## 2019-05-01 LAB — LACTIC ACID, PLASMA: Lactic Acid, Venous: 1.4 mmol/L (ref 0.5–1.9)

## 2019-05-01 LAB — FIBRIN DERIVATIVES D-DIMER (ARMC ONLY): Fibrin derivatives D-dimer (ARMC): 873.85 ng/mL (FEU) — ABNORMAL HIGH (ref 0.00–499.00)

## 2019-05-01 LAB — GLUCOSE, CAPILLARY: Glucose-Capillary: 183 mg/dL — ABNORMAL HIGH (ref 70–99)

## 2019-05-01 LAB — TRIGLYCERIDES: Triglycerides: 157 mg/dL — ABNORMAL HIGH (ref <150)

## 2019-05-01 LAB — PROCALCITONIN: Procalcitonin: 0.17 ng/mL

## 2019-05-01 LAB — FERRITIN: Ferritin: 22 ng/mL (ref 11–307)

## 2019-05-01 LAB — LACTATE DEHYDROGENASE: LDH: 119 U/L (ref 98–192)

## 2019-05-01 MED ORDER — DEXAMETHASONE SODIUM PHOSPHATE 10 MG/ML IJ SOLN
10.0000 mg | Freq: Once | INTRAMUSCULAR | Status: AC
  Administered 2019-05-01: 14:00:00 10 mg via INTRAVENOUS
  Filled 2019-05-01: qty 1

## 2019-05-01 MED ORDER — SODIUM CHLORIDE 0.9 % IV SOLN
1.0000 g | Freq: Once | INTRAVENOUS | Status: AC
  Administered 2019-05-01: 1 g via INTRAVENOUS
  Filled 2019-05-01: qty 10

## 2019-05-01 MED ORDER — SODIUM CHLORIDE 0.9 % IV BOLUS
500.0000 mL | Freq: Once | INTRAVENOUS | Status: AC
  Administered 2019-05-01: 500 mL via INTRAVENOUS

## 2019-05-01 NOTE — ED Provider Notes (Signed)
Patient continuing to rest, currently on nonrebreather with normal oxygen saturation.  She is in grave condition with her diagnosis of COVID-19, family including her medical decision maker is aware.  Risks of transfer have been discussed, including risk of high risk of death during this hospitalization due to critical illness of COVID-19.  Patient appropriate for transfer to [facility] at this time   Delman Kitten, MD 05/01/19 2237

## 2019-05-01 NOTE — ED Provider Notes (Signed)
Attmpted to call Gevena Mart (emergency contact), no answer. Also called and no answer from Ms. Littlejohn. Request to return call (no patient info associated) left on voicemail   Delman Kitten, MD 05/01/19 1810

## 2019-05-01 NOTE — Progress Notes (Signed)
ED visit made. Patient is currently followed by [employer] [facility] at [facility] with a hospice diagnosis of Heart disease, She is a DNR code with out of facility DNR in place. At baseline patient ambulates with a  Walker and is on 3 liters of oxygen. She tested positive for COVID last week and developed more respiratory symptoms over night.  She was sent to the [facility] for evaluation. Plan per discussion with EDP Dr. Jimmye Norman is for patient to be transferred to [facility] for treatment, he has spoken to patient's niece to advise. Hospice team updated. [name] BSN, RN, [facility] Liaison [phone]

## 2019-05-01 NOTE — Progress Notes (Signed)
Clinical Education officer, museum (CSW) received a call from [employer] from [facility]. CSW made Lattie Haw aware that per chart patient will transfer to [place], LCSW [phone]

## 2019-05-01 NOTE — ED Notes (Signed)
Pt increased back up to 15L non rebreather due to decrease in oxygen saturation between 85-87

## 2019-05-01 NOTE — Progress Notes (Signed)
The patient appeared to get sleepy about an hour ago, we check the venous blood gas and discovered hypercarbia. I placed her on BiPAP and she appears much better. On BiPAP she is telling us that she is feeling better. We will repeat a venous blood gas. Understandably we cannot transport her on BiPAP. The plan is to clear her CO2 and transport her on sufficient oxygen. I reconfirmed with family that she does not want to be able to be there for any reason and she is a new code DNR. She appears to be improving right now.

## 2019-05-01 NOTE — ED Provider Notes (Signed)
Spoke with Ms. Armenta; niece understands and agreeable with plan and understands risks of transfer.  Niece confirms the patient is DNR. Understands risks of death associated with disease/COVID 52 and transfer/hospitalization.    Delman Kitten, MD 05/01/19 [phone]

## 2019-05-01 NOTE — ED Notes (Addendum)
[facility] arrived to transport pt to [facility], pt unable to sign transfer consent due to AMS

## 2019-05-01 NOTE — ED Notes (Signed)
[facility]  CALLED  FOR  TRANSFER 

## 2019-05-01 NOTE — ED Provider Notes (Signed)
Patient is on BiPAP, no hypoxia.  She is tolerating BiPAP well.  She does have a DO NOT RESUSCITATE  Because of transport limitations and concerns around BiPAP utilization, the patient will need to be transported to [facility] on [address].  She is DNR, she has been previously on nonrebreather without hypoxia or apnea, I have called and spoke with [facility] hospitalist and we will plan to transfer her to [facility] during her transport she will be maintained on nonrebreather and maintain her DNR status.  I have called and left message for emergency contact as well as other family members but have not received a call back from them yet, however Dr. Jimmye Norman previously discussed case and plan with the family   Delman Kitten, MD 05/01/19 [phone]

## 2019-05-01 NOTE — ED Provider Notes (Signed)
[facility] Emergency Department Provider Note       Time seen: ----------------------------------------- 11:31 AM on 05/01/2019 -----------------------------------------   I have reviewed the triage vital signs and the nursing notes.  HISTORY   Chief Complaint covid + and Altered Mental Status    HPI Sheila Hayes is a 78 y.o. female with a history of CHF, COPD, diabetes, hyperlipidemia, hypertension, hypothyroidism who presents to the ED for hypoxia and altered mental status.  She was hypoxic on 4 L nasal cannula oxygen.  She was placed subsequently on a nonrebreather and was normoxic.  Reportedly she is Covid positive.  Past Medical History:  Diagnosis Date  . Chronic diastolic congestive heart failure ([place])   . COPD (chronic obstructive pulmonary disease) ([place])   . Diabetes mellitus without complication ([place])   . Hyperlipidemia   . Hypertension   . Hypothyroidism   . Morbid obesity ([place])   . Pulmonary hypertension [facility])     Patient Active Problem List   Diagnosis Date Noted  . MRSA bacteremia 09/02/2018  . Acute respiratory failure with hypercapnia ([place]) 06/14/2018  . Acute on chronic diastolic CHF (congestive heart failure) ([place]) 02/23/2018  . Altered mental status   . Urinary tract infection without hematuria   . Acute respiratory failure with hypoxia and hypercapnia ([place]) 01/30/2018  . Near syncope 10/09/2017  . HTN (hypertension) 10/09/2017  . Diabetes ([place]) 10/09/2017  . HLD (hyperlipidemia) 10/09/2017  . Chronic diastolic CHF (congestive heart failure) ([place]) 10/09/2017  . Hypothyroidism 10/09/2017  . COPD exacerbation ([place]) 05/13/2016    Past Surgical History:  Procedure Laterality Date  . APPENDECTOMY    . CHOLECYSTECTOMY      Allergies Patient has no known allergies.  Social History Social History   Tobacco Use  . Smoking status: Former Research scientist (life sciences)  . Smokeless tobacco: Never Used  Substance Use Topics  . Alcohol  use: No  . Drug use: No   Review of Systems Constitutional: Negative for fever. Cardiovascular: Negative for chest pain. Respiratory: Negative for shortness of breath. Gastrointestinal: Negative for abdominal pain, vomiting and diarrhea. Musculoskeletal: Negative for back pain. Skin: Negative for rash. Neurological: Negative for headaches, focal weakness or numbness.  All systems negative/normal/unremarkable except as stated in the HPI  ____________________________________________   PHYSICAL EXAM:  VITAL SIGNS: ED Triage Vitals  Enc Vitals Group     BP      Pulse      Resp      Temp      Temp src      SpO2      Weight      Height      Head Circumference      Peak Flow      Pain Score      Pain Loc      Pain Edu?      Excl. in [place]?    Constitutional: Drowsy but arouses easily to verbal or painful stimuli. Eyes: Conjunctivae are normal. Normal extraocular movements. ENT      Head: Normocephalic and atraumatic.      Nose: No congestion/rhinnorhea.      Mouth/Throat: Mucous membranes are moist.      Neck: No stridor. Cardiovascular: Rapid rate, regular rhythm. No murmurs, rubs, or gallops. Respiratory: Scattered rales are noted, tachypnea Gastrointestinal: Soft and nontender. Normal bowel sounds Musculoskeletal: Nontender with normal range of motion in extremities. No lower extremity tenderness nor edema. Neurologic:  Normal speech and language. No gross focal neurologic deficits are  appreciated.  Skin:  Skin is warm, dry and intact. No rash noted. Psychiatric: Mood and affect are normal. Speech and behavior are normal.  ____________________________________________  EKG: Interpreted by me.  Sinus tachycardia with a rate of 111 bpm, wide complex, right bundle branch block  ____________________________________________  ED COURSE:  As part of my medical decision making, I reviewed the following data within the electronic MEDICAL RECORD NUMBER History obtained from family  if available, nursing notes, old chart and ekg, as well as notes from prior ED visits. Patient presented for altered mental status and hypoxia, we will assess with labs and imaging as indicated at this time.   Procedures  Sheila Hayes was evaluated in Emergency Department on 05/01/2019 for the symptoms described in the history of present illness. She was evaluated in the context of the global COVID-19 pandemic, which necessitated consideration that the patient might be at risk for infection with the SARS-CoV-2 virus that causes COVID-19. Institutional protocols and algorithms that pertain to the evaluation of patients at risk for COVID-19 are in a state of rapid change based on information released by regulatory bodies including the CDC and federal and state organizations. These policies and algorithms were followed during the patient's care in the ED.  ____________________________________________   LABS (pertinent positives/negatives)  Labs Reviewed  CBC WITH DIFFERENTIAL/PLATELET - Abnormal; Notable for the following components:      Result Value   Hemoglobin 9.8 (*)    MCH 23.0 (*)    MCHC 26.8 (*)    RDW 15.8 (*)    All other components within normal limits  COMPREHENSIVE METABOLIC PANEL - Abnormal; Notable for the following components:   Potassium 3.3 (*)    Chloride 91 (*)    CO2 41 (*)    Calcium 8.8 (*)    Albumin 3.2 (*)    All other components within normal limits  FIBRIN DERIVATIVES D-DIMER ([facility] ONLY) - Abnormal; Notable for the following components:   Fibrin derivatives D-dimer ([facility]) 873.85 (*)    All other components within normal limits  FIBRINOGEN - Abnormal; Notable for the following components:   Fibrinogen >750 (*)    All other components within normal limits  URINALYSIS, COMPLETE (UACMP) WITH MICROSCOPIC - Abnormal; Notable for the following components:   Color, Urine AMBER (*)    APPearance CLOUDY (*)    Hgb urine dipstick SMALL (*)    Protein, ur 100 (*)     Nitrite POSITIVE (*)    Leukocytes,Ua TRACE (*)    Bacteria, UA MANY (*)    All other components within normal limits  CULTURE, BLOOD (ROUTINE X 2)  CULTURE, BLOOD (ROUTINE X 2)  LACTIC ACID, PLASMA  LACTATE DEHYDROGENASE  LACTIC ACID, PLASMA  PROCALCITONIN  FERRITIN  TRIGLYCERIDES  C-REACTIVE PROTEIN  TROPONIN I (HIGH SENSITIVITY)   CRITICAL CARE Performed by: Ulice Dash   Total critical care time: 30 minutes  Critical care time was exclusive of separately billable procedures and treating other patients.  Critical care was necessary to treat or prevent imminent or life-threatening deterioration.  Critical care was time spent personally by me on the following activities: development of treatment plan with patient and/or surrogate as well as nursing, discussions with consultants, evaluation of patient's response to treatment, examination of patient, obtaining history from patient or surrogate, ordering and performing treatments and interventions, ordering and review of laboratory studies, ordering and review of radiographic studies, pulse oximetry and re-evaluation of patient's condition.  RADIOLOGY Images were viewed  by me  Chest x-ray IMPRESSION:  1. Prominent bilateral interstitial prominence. Pneumonitis could  present in this fashion with known COVID-19 infection.   2. Cardiomegaly.  ____________________________________________   DIFFERENTIAL DIAGNOSIS   COVID-19, pneumonia, CHF, PE, pneumothorax  FINAL ASSESSMENT AND PLAN  COVID-19, hypoxia   Plan: The patient had presented for hypoxia and altered mental status. Patient's labs were consistent with typical COVID-19, possible UTI was discovered. Patient's imaging revealed likely pneumonitis.  I have ordered IV Decadron, she does require 10 L of oxygen by nonrebreather.  I have discussed with the family who [facility].  She appears to be stable for transfer  at this time.   Ulice DashJohnathan E Akyah Lagrange, MD    Note: This note was generated in part or whole with voice recognition software. Voice recognition is usually quite accurate but there are transcription errors that can and very often do occur. I apologize for any typographical errors that were not detected and corrected.     Emily FilbertWilliams, Piero Mustard E, MD 05/01/19 1247

## 2019-05-01 NOTE — ED Notes (Signed)
Non rebreather turned down to 10L

## 2019-05-01 NOTE — ED Triage Notes (Signed)
Pt arrives via ems from [facility]. EMS reports that pt tested positive for covid several days ago, and has AMS but was unable to get baseline from facility, pt also has a hx of COPD. Pt able to state name and birthday but unable to answer other questions at this time. Pt arrived on non rebreather. Initial oxygen sat at facility 82% 4 L [place].   Ems vitals 98% non rebreather cbg 87

## 2019-05-01 NOTE — ED Provider Notes (Signed)
Patient has been accepted in transfer to the [facility].  She is DNR   Sheila Newport, MD 05/01/19 1323

## 2019-05-02 ENCOUNTER — Encounter (HOSPITAL_COMMUNITY): Payer: Self-pay

## 2019-05-02 DIAGNOSIS — J1282 Pneumonia due to coronavirus disease 2019: Secondary | ICD-10-CM | POA: Diagnosis present

## 2019-05-02 DIAGNOSIS — I10 Essential (primary) hypertension: Secondary | ICD-10-CM

## 2019-05-02 DIAGNOSIS — J1289 Other viral pneumonia: Secondary | ICD-10-CM

## 2019-05-02 DIAGNOSIS — I5032 Chronic diastolic (congestive) heart failure: Secondary | ICD-10-CM

## 2019-05-02 DIAGNOSIS — J9601 Acute respiratory failure with hypoxia: Secondary | ICD-10-CM

## 2019-05-02 DIAGNOSIS — U071 COVID-19: Principal | ICD-10-CM

## 2019-05-02 DIAGNOSIS — J9602 Acute respiratory failure with hypercapnia: Secondary | ICD-10-CM

## 2019-05-02 LAB — CBC
HCT: 32.9 % — ABNORMAL LOW (ref 36.0–46.0)
Hemoglobin: 8.7 g/dL — ABNORMAL LOW (ref 12.0–15.0)
MCH: 23.9 pg — ABNORMAL LOW (ref 26.0–34.0)
MCHC: 26.4 g/dL — ABNORMAL LOW (ref 30.0–36.0)
MCV: 90.4 fL (ref 80.0–100.0)
Platelets: 213 10*3/uL (ref 150–400)
RBC: 3.64 MIL/uL — ABNORMAL LOW (ref 3.87–5.11)
RDW: 15.9 % — ABNORMAL HIGH (ref 11.5–15.5)
WBC: 6.9 10*3/uL (ref 4.0–10.5)
nRBC: 0 % (ref 0.0–0.2)

## 2019-05-02 LAB — CREATININE, SERUM
Creatinine, Ser: 0.85 mg/dL (ref 0.44–1.00)
GFR calc Af Amer: >60 mL/min (ref >60)
GFR calc non Af Amer: >60 mL/min (ref >60)

## 2019-05-02 LAB — GLUCOSE, CAPILLARY
Glucose-Capillary: 89 mg/dL (ref 70–99)
Glucose-Capillary: 158 mg/dL — ABNORMAL HIGH (ref 70–99)
Glucose-Capillary: 139 mg/dL — ABNORMAL HIGH (ref 70–99)
Glucose-Capillary: 194 mg/dL — ABNORMAL HIGH (ref 70–99)

## 2019-05-02 LAB — HEMOGLOBIN A1C
Hgb A1c MFr Bld: 5.4 % (ref 4.8–5.6)
Mean Plasma Glucose: 108.28 mg/dL

## 2019-05-02 LAB — ABO/RH: ABO/RH(D): A NEG

## 2019-05-02 MED ORDER — SODIUM CHLORIDE 0.9 % IV SOLN
1.0000 g | INTRAVENOUS | Status: DC
  Filled 2019-05-02: qty 10

## 2019-05-02 MED ORDER — HYDROCOD POLST-CPM POLST ER 10-8 MG/5ML PO SUER
5.0000 mL | Freq: Two times a day (BID) | ORAL | Status: DC
  Administered 2019-05-02 – 2019-05-04 (×5): 5 mL via ORAL
  Filled 2019-05-02 (×5): qty 5

## 2019-05-02 MED ORDER — GABAPENTIN 100 MG PO CAPS
200.0000 mg | ORAL_CAPSULE | Freq: Two times a day (BID) | ORAL | Status: DC
  Administered 2019-05-02 – 2019-05-08 (×13): 200 mg via ORAL
  Filled 2019-05-02 (×13): qty 2

## 2019-05-02 MED ORDER — ACETAMINOPHEN 325 MG PO TABS
650.0000 mg | ORAL_TABLET | Freq: Four times a day (QID) | ORAL | Status: DC | PRN
  Administered 2019-05-06 – 2019-05-07 (×2): 650 mg via ORAL
  Filled 2019-05-02 (×2): qty 2

## 2019-05-02 MED ORDER — ENOXAPARIN SODIUM 100 MG/ML ~~LOC~~ SOLN
1.0000 mg/kg | Freq: Two times a day (BID) | SUBCUTANEOUS | Status: DC
  Administered 2019-05-02 – 2019-05-05 (×6): 95 mg via SUBCUTANEOUS
  Filled 2019-05-02 (×8): qty 1

## 2019-05-02 MED ORDER — PANTOPRAZOLE SODIUM 40 MG PO TBEC
40.0000 mg | DELAYED_RELEASE_TABLET | Freq: Every day | ORAL | Status: DC
  Administered 2019-05-02 – 2019-05-08 (×7): 40 mg via ORAL
  Filled 2019-05-02 (×7): qty 1

## 2019-05-02 MED ORDER — SODIUM CHLORIDE 0.9 % IV SOLN
100.0000 mg | INTRAVENOUS | Status: AC
  Administered 2019-05-03 – 2019-05-06 (×4): 100 mg via INTRAVENOUS
  Filled 2019-05-02: qty 20
  Filled 2019-05-02 (×2): qty 100
  Filled 2019-05-02: qty 20

## 2019-05-02 MED ORDER — ONDANSETRON HCL 4 MG PO TABS: 4.0000 mg | ORAL_TABLET | Freq: Four times a day (QID) | ORAL | Status: DC | PRN

## 2019-05-02 MED ORDER — ROSUVASTATIN CALCIUM 20 MG PO TABS
40.0000 mg | ORAL_TABLET | Freq: Every evening | ORAL | Status: DC
  Administered 2019-05-02 – 2019-05-07 (×6): 40 mg via ORAL
  Filled 2019-05-02 (×6): qty 2

## 2019-05-02 MED ORDER — GUAIFENESIN-DM 100-10 MG/5ML PO SYRP
10.0000 mL | ORAL_SOLUTION | ORAL | Status: DC | PRN
  Administered 2019-05-03: 10 mL via ORAL
  Filled 2019-05-02: qty 10

## 2019-05-02 MED ORDER — ZINC SULFATE 220 (50 ZN) MG PO CAPS
220.0000 mg | ORAL_CAPSULE | Freq: Every day | ORAL | Status: DC
  Administered 2019-05-02 – 2019-05-08 (×7): 220 mg via ORAL
  Filled 2019-05-02 (×7): qty 1

## 2019-05-02 MED ORDER — MOMETASONE FURO-FORMOTEROL FUM 100-5 MCG/ACT IN AERO
2.0000 | INHALATION_SPRAY | Freq: Two times a day (BID) | RESPIRATORY_TRACT | Status: DC
  Administered 2019-05-02 – 2019-05-08 (×12): 2 via RESPIRATORY_TRACT
  Filled 2019-05-02: qty 8.8

## 2019-05-02 MED ORDER — SODIUM CHLORIDE 0.9% FLUSH: 3.0000 mL | INTRAVENOUS | Status: DC | PRN

## 2019-05-02 MED ORDER — SODIUM CHLORIDE 0.9 % IV SOLN: 250.0000 mL | INTRAVENOUS | Status: DC | PRN

## 2019-05-02 MED ORDER — ENOXAPARIN SODIUM 60 MG/0.6ML ~~LOC~~ SOLN
55.0000 mg | Freq: Once | SUBCUTANEOUS | Status: AC
  Administered 2019-05-02: 55 mg via SUBCUTANEOUS
  Filled 2019-05-02: qty 0.6

## 2019-05-02 MED ORDER — ENOXAPARIN SODIUM 40 MG/0.4ML ~~LOC~~ SOLN
40.0000 mg | SUBCUTANEOUS | Status: DC
  Administered 2019-05-02: 40 mg via SUBCUTANEOUS
  Filled 2019-05-02: qty 0.4

## 2019-05-02 MED ORDER — CITALOPRAM HYDROBROMIDE 10 MG PO TABS
40.0000 mg | ORAL_TABLET | Freq: Every day | ORAL | Status: DC
  Administered 2019-05-02 – 2019-05-08 (×7): 40 mg via ORAL
  Filled 2019-05-02 (×7): qty 4

## 2019-05-02 MED ORDER — MOMETASONE FURO-FORMOTEROL FUM 100-5 MCG/ACT IN AERO
2.0000 | INHALATION_SPRAY | Freq: Two times a day (BID) | RESPIRATORY_TRACT | Status: DC
  Filled 2019-05-02: qty 8.8

## 2019-05-02 MED ORDER — LEVOTHYROXINE SODIUM 75 MCG PO TABS
125.0000 ug | ORAL_TABLET | Freq: Every day | ORAL | Status: DC
  Administered 2019-05-03 – 2019-05-08 (×6): 125 ug via ORAL
  Filled 2019-05-02 (×6): qty 1

## 2019-05-02 MED ORDER — ONDANSETRON HCL 4 MG/2ML IJ SOLN: 4.0000 mg | Freq: Four times a day (QID) | INTRAMUSCULAR | Status: DC | PRN

## 2019-05-02 MED ORDER — SODIUM CHLORIDE 0.9% FLUSH
3.0000 mL | Freq: Two times a day (BID) | INTRAVENOUS | Status: DC
  Administered 2019-05-02 – 2019-05-08 (×14): 3 mL via INTRAVENOUS

## 2019-05-02 MED ORDER — VITAMIN C 500 MG PO TABS
500.0000 mg | ORAL_TABLET | Freq: Every day | ORAL | Status: DC
  Administered 2019-05-02 – 2019-05-08 (×7): 500 mg via ORAL
  Filled 2019-05-02 (×7): qty 1

## 2019-05-02 MED ORDER — DOCUSATE SODIUM 100 MG PO CAPS
200.0000 mg | ORAL_CAPSULE | Freq: Every day | ORAL | Status: DC
  Administered 2019-05-02 – 2019-05-07 (×6): 200 mg via ORAL
  Filled 2019-05-02 (×6): qty 2

## 2019-05-02 MED ORDER — LORAZEPAM 0.5 MG PO TABS
0.5000 mg | ORAL_TABLET | ORAL | Status: DC | PRN
  Administered 2019-05-03: 21:00:00 0.5 mg via ORAL
  Filled 2019-05-02: qty 1

## 2019-05-02 MED ORDER — METOPROLOL TARTRATE 5 MG/5ML IV SOLN
5.0000 mg | INTRAVENOUS | Status: DC | PRN
  Administered 2019-05-02 – 2019-05-04 (×2): 5 mg via INTRAVENOUS
  Filled 2019-05-02 (×2): qty 5

## 2019-05-02 MED ORDER — SODIUM CHLORIDE 0.9 % IV SOLN
200.0000 mg | Freq: Once | INTRAVENOUS | Status: AC
  Administered 2019-05-02: 200 mg via INTRAVENOUS
  Filled 2019-05-02: qty 40

## 2019-05-02 MED ORDER — IPRATROPIUM-ALBUTEROL 20-100 MCG/ACT IN AERS
1.0000 | INHALATION_SPRAY | Freq: Four times a day (QID) | RESPIRATORY_TRACT | Status: DC
  Administered 2019-05-02 – 2019-05-08 (×22): 1 via RESPIRATORY_TRACT
  Filled 2019-05-02: qty 4

## 2019-05-02 MED ORDER — INSULIN ASPART 100 UNIT/ML ~~LOC~~ SOLN
0.0000 [IU] | Freq: Three times a day (TID) | SUBCUTANEOUS | Status: DC
  Administered 2019-05-02: 1 [IU] via SUBCUTANEOUS
  Administered 2019-05-02 – 2019-05-03 (×2): 2 [IU] via SUBCUTANEOUS
  Administered 2019-05-03: 1 [IU] via SUBCUTANEOUS

## 2019-05-02 MED ORDER — PROCHLORPERAZINE MALEATE 10 MG PO TABS
10.0000 mg | ORAL_TABLET | Freq: Four times a day (QID) | ORAL | Status: DC | PRN
  Filled 2019-05-02: qty 1

## 2019-05-02 MED ORDER — ALBUTEROL SULFATE HFA 108 (90 BASE) MCG/ACT IN AERS
2.0000 | INHALATION_SPRAY | RESPIRATORY_TRACT | Status: DC | PRN
  Filled 2019-05-02: qty 6.7

## 2019-05-02 MED ORDER — CALCIUM CARBONATE 1250 (500 CA) MG PO TABS
500.0000 mg | ORAL_TABLET | Freq: Two times a day (BID) | ORAL | Status: DC
  Administered 2019-05-02 – 2019-05-08 (×12): 500 mg via ORAL
  Filled 2019-05-02 (×15): qty 1

## 2019-05-02 MED ORDER — METOPROLOL TARTRATE 25 MG PO TABS
25.0000 mg | ORAL_TABLET | Freq: Two times a day (BID) | ORAL | Status: DC
  Administered 2019-05-02 – 2019-05-04 (×5): 25 mg via ORAL
  Filled 2019-05-02 (×4): qty 1

## 2019-05-02 MED ORDER — DEXAMETHASONE SODIUM PHOSPHATE 10 MG/ML IJ SOLN
6.0000 mg | INTRAMUSCULAR | Status: DC
  Administered 2019-05-02 – 2019-05-03 (×2): 6 mg via INTRAVENOUS
  Filled 2019-05-02 (×2): qty 1

## 2019-05-02 MED ORDER — FUROSEMIDE 20 MG PO TABS
20.0000 mg | ORAL_TABLET | Freq: Every day | ORAL | Status: DC
  Administered 2019-05-02 – 2019-05-08 (×7): 20 mg via ORAL
  Filled 2019-05-02 (×7): qty 1

## 2019-05-02 MED ORDER — HYDROCOD POLST-CPM POLST ER 10-8 MG/5ML PO SUER: 5.0000 mL | Freq: Two times a day (BID) | ORAL | Status: DC | PRN

## 2019-05-02 MED ORDER — METOPROLOL TARTRATE 25 MG PO TABS
12.5000 mg | ORAL_TABLET | Freq: Two times a day (BID) | ORAL | Status: DC
  Filled 2019-05-02: qty 1

## 2019-05-02 NOTE — Progress Notes (Signed)
ANTICOAGULATION CONSULT NOTE - Initial Consult  Pharmacy Consult for Lovenox Indication: atrial fibrillation  No Known Allergies  Patient Measurements: Height: 5\' 7"  (170.2 cm) Weight: 211 lb 8 oz (95.9 kg) IBW/kg (Calculated) : 61.6  Vital Signs: Temp: 98.6 F (37 C) (11/10 1150) Temp Source: Oral (11/10 1150) BP: 110/58 (11/10 1230) Pulse Rate: 99 (11/10 1233)  Labs: Recent Labs    05/01/19 1131 05/01/19 1538 05/02/19 0322  HGB 9.8*  --  8.7*  HCT 36.5  --  32.9*  PLT 208  --  213  CREATININE 0.84  --  0.85  TROPONINIHS 13 14  --     Estimated Creatinine Clearance: 65.9 mL/min (by C-G formula based on SCr of 0.85 mg/dL).   Medical History: Past Medical History:  Diagnosis Date  . Chronic diastolic congestive heart failure ([place])   . COPD (chronic obstructive pulmonary disease) ([place])   . Diabetes mellitus without complication ([place])   . Hyperlipidemia   . Hypertension   . Hypothyroidism   . Morbid obesity ([place])   . Pulmonary hypertension ([facility])     Medications:  Medications Prior to Admission  Medication Sig Dispense Refill Last Dose  . acetaminophen (TYLENOL) 500 MG tablet Take 500 mg by mouth every 6 (six) hours as needed for headache.     . albuterol (PROVENTIL HFA;VENTOLIN HFA) 108 (90 Base) MCG/ACT inhaler Inhale 2 puffs into the lungs every 6 (six) hours as needed for wheezing or shortness of breath.     . budesonide-formoterol (SYMBICORT) 80-4.5 MCG/ACT inhaler Inhale 2 puffs into the lungs 2 (two) times daily.     . calcium carbonate (OSCAL) 1500 (600 Ca) MG TABS tablet Take 600 mg of elemental calcium by mouth 2 (two) times daily with a meal.      . citalopram (CELEXA) 40 MG tablet Take 40 mg by mouth daily.     Marland Kitchen docusate sodium (COLACE) 100 MG capsule Take 200 mg by mouth at bedtime.     . furosemide (LASIX) 20 MG tablet Take 20 mg by mouth daily.     Marland Kitchen gabapentin (NEURONTIN) 100 MG capsule Take 200 mg by mouth 2 (two) times daily.     Marland Kitchen glipiZIDE  (GLUCOTROL) 10 MG tablet Take 10 mg by mouth daily before breakfast.     . ipratropium-albuterol (DUONEB) 0.5-2.5 (3) MG/3ML SOLN Take 3 mLs by nebulization 3 (three) times daily.     Marland Kitchen levothyroxine (SYNTHROID, LEVOTHROID) 125 MCG tablet Take 125 mcg by mouth daily before breakfast.     . LORazepam (ATIVAN) 0.5 MG tablet Take 0.5 mg by mouth every 4 (four) hours as needed for anxiety.     . metFORMIN (GLUCOPHAGE) 500 MG tablet Take 1,000 mg by mouth 2 (two) times daily with a meal.      . metoprolol tartrate (LOPRESSOR) 25 MG tablet Take 0.5 tablets (12.5 mg total) by mouth 2 (two) times daily. (Patient taking differently: Take 12.5 mg by mouth 2 (two) times daily. HOLD for SBP <100 or HR <60) 30 tablet 0   . nystatin (NYSTATIN) powder Apply topically 2 (two) times daily as needed. For fungal growth     . omeprazole (PRILOSEC) 20 MG capsule Take 20 mg by mouth daily.     . potassium chloride (K-DUR) 10 MEQ tablet Take 10 mEq by mouth daily.     . predniSONE (DELTASONE) 10 MG tablet Label  & dispense according to the schedule below. 5 Pills PO for 1 day then, 4 Pills  PO for 1 day, 3 Pills PO for 1 day, 2 Pills PO for 1 day, 1 Pill PO for 1 days then STOP. (Patient not taking: Reported on 05/01/2019) 15 tablet 0   . prochlorperazine (COMPAZINE) 10 MG tablet Take 10 mg by mouth every 6 (six) hours as needed for nausea or vomiting.     . rosuvastatin (CRESTOR) 40 MG tablet Take 40 mg by mouth every evening.      . tiotropium (SPIRIVA) 18 MCG inhalation capsule Place 18 mcg into inhaler and inhale daily.       Assessment: 5 YOF with COVID-19 associated hypoxia. Now with new Afib. Pharmacy consulted to start treatment dose Lovenox. H/H low, Plt wnl. SCr wnl. Of note, patient received a prophylactic dose of Lovenox this AM at 0834  Goal of Therapy:  Anti-Xa level 0.6-1 units/ml 4hrs after LMWH dose given Monitor platelets by anticoagulation protocol: Yes   Plan:  -Start Lovenox 1 mg/kg (95 mg) BID.  Will only give 55 mg now in addition to AM dose -Monitor renal fx, CBC and s/s of bleeding  Vinnie Level, PharmD., BCPS Clinical Pharmacist Clinical phone for 05/02/19 until 5pm: [phone]

## 2019-05-02 NOTE — Progress Notes (Signed)
Pt's HR 160-173 sustaining. Provider has been made aware and EKG was done. After  EKG provider  was paged. Patient  is in Afib with RVR provider ordered metoprolol IV 5mg  and a po form of 12.5mg  to be given afterward. After metoprolol IV administration pt HR is 99-100.  Will continue to monitor

## 2019-05-02 NOTE — Progress Notes (Addendum)
Pharmacy Note - Remdesivir Dosing  O:  ALT: 26  CXR:  Prominent bilateral interstitial prominence. Pneumonitis could present in this fashion with known COVID-19 infection  Requiring supplemental O2:   HFNC @8     A/P:  Patient meets criteria for remdesivir.  Begin remdesivir 200 mg IV x 1, followed by 100 mg IV daily x 4 days  Monitor ALT, clinical progress  Despina Pole, Pharm. D. Clinical Pharmacist 05/02/2019 1:04 AM

## 2019-05-02 NOTE — Plan of Care (Signed)
  Problem: Education: Goal: Knowledge of risk factors and measures for prevention of condition will improve Outcome: Progressing   Problem: Coping: Goal: Psychosocial and spiritual needs will be supported Outcome: Progressing   Problem: Respiratory: Goal: Will maintain a patent airway Outcome: Progressing Goal: Complications related to the disease process, condition or treatment will be avoided or minimized Outcome: Progressing   

## 2019-05-02 NOTE — Progress Notes (Signed)
PROGRESS NOTE    Sheila Hayes  YBO:[id number] DOB: 04-21-1941 DOA: 05/01/2019 PCP: Almetta Lovely, [facility]    Brief Narrative:  78 year old female who presented with hypoxemia.  She does have significant past medical history for COPD, chronic hypoxic respiratory failure, using 3 L of supplemental oxygen per nasal cannula, she also has congestive heart failure and hypertension.  She lives at the nursing facility where she was noted to be confused and dyspneic, she was found acute on chronic hypoxemia and was transferred to the hospital.  On her initial physical examination blood pressure 134/66, temperature 98.9, oxygen saturation 94% on supplemental oxygen, her lungs are clear to auscultation bilaterally, heart S1-S2 present rhythmic, the abdomen was soft no lower extremity edema. Sodium 142, potassium 3.3, chloride 91, bicarb 41, glucose 75, BUN 19, creatinine 1.84, white count 6.2, hemoglobin 9.8, hematocrit 36.5, platelets 208.  Urinalysis 11-20 white cells, specific gravity 1.021, 100 protein.  Her chest radiograph had bilateral interstitial infiltrates, predominantly in the right base but also left base and left upper lobe.  EKG 111 bpm, normal axis, right bundle branch block, sinus rhythm, no ST segment changes, negative T wave in V1 through V3.  Patient was admitted to the hospital with a working diagnosis of acute on chronic hypoxic respiratory failure due to SARS COVID-19 viral pneumonia.   Developed new onset atrial fibrillation, controlled with B blockade and started on anticoagulation with enoxaparin.    Assessment & Plan:   Principal Problem:   Pneumonia due to COVID-19 virus Active Problems:   HTN (hypertension)   Diabetes (HCC)   Chronic diastolic CHF (congestive heart failure) (HCC)   Acute respiratory failure with hypoxia and hypercapnia (HCC)   Urinary tract infection without hematuria   1. Acute on chronic hypoxic respiratory failure due to SARS COVID 19 viral  pneumonia.   RR: 16  Pulse oxymetry: 96 to 97% Fi02: 6 down to 4 LPM per HFNC   COVID-19 Labs  Recent Labs    05/01/19 1131  FERRITIN 22  LDH 119  CRP 22.1*    No results found for: SARSCOV2NAA Patient tested positive for COVID 19 about 7 days before hospitalization, test performed per hospice agency. I have requested the paper copy to be faxed for further documentation.   Continue medical therapy with Remdesivir #1/5 and systemic corticosteroids. Antitussive agents wit guaifenesin DM and chlorpheniramine.   2. COPD with acute exacerbation. Will continue bronchodilator therapy with albutero/ ipratropium, dulera.   3. Diastolic heart failure. No signs of acute exacerbation, will continue oral furosemide and blood pressure control.  3. Hypotyhyroid. Will continue levothyroxine.   4. New onset atrial fibrillation with RVR. Increase metoprolol to 25 mg po bid, add anticoagulation with enoxaparin. Patient with high risk of thrombosis, CHADs Vasc  score is 6.  5. UTI. UA with 11 to 20 wbc no frank urinary symptoms, urine culture pending, will hold on antibiotic therapy for now.   6. T2DM and dyslipidemia. Will continue with insulin sliding scale for glucose cover and monitoring. Continue with rosuvastatin.   7. Dementia. Positive confusion and disorientation, will continue with citalopram, as needed lorazepam.   DVT prophylaxis: enoxaparin   Code Status:  dnr  Family Communication:  No family at the bedside  Disposition Plan/ discharge barriers: pending clinical improvement,   Body mass index is 33.13 kg/m. Malnutrition Type:      Malnutrition Characteristics:      Nutrition Interventions:     RN Pressure Injury Documentation:  Consultants:     Procedures:     Antimicrobials:       Subjective: Patient continue to have dyspnea, not yet back to baseline, seems to be confused and disorientated.   Objective: Vitals:   05/02/19 0200 05/02/19 0400  05/02/19 0756 05/02/19 0800  BP: 122/72 (!) 99/54 120/60 (!) 117/54  Pulse: (!) 106 (!) 104 98 96  Resp:   16   Temp:  98.4 F (36.9 C) 98.5 F (36.9 C)   TempSrc:  Oral Oral   SpO2: 94% 96% 96% 97%  Weight:      Height:        Intake/Output Summary (Last 24 hours) at 05/02/2019 0820 Last data filed at 05/02/2019 0400 Gross per 24 hour  Intake 360 ml  Output -  Net 360 ml   Filed Weights   05/01/19 2342  Weight: 95.9 kg    Examination:   General: deconditioned.  Neurology: Awake and alert, non focal, confused and disorientated.  E ENT: poisitive pallor, no icterus, oral mucosa moist Cardiovascular: No JVD. S1-S2 present, rhythmic, no gallops, rubs, or murmurs. No lower extremity edema. Pulmonary: positive breath sounds bilaterally, rhonchi bilaterally. Gastrointestinal. Abdomen with no organomegaly, non tender, no rebound or guarding Skin. No rashes Musculoskeletal: no joint deformities     Data Reviewed: I have personally reviewed following labs and imaging studies  CBC: Recent Labs  Lab 05/01/19 1131 05/02/19 0322  WBC 6.2 6.9  NEUTROABS 5.1  --   HGB 9.8* 8.7*  HCT 36.5 32.9*  MCV 85.7 90.4  PLT 208 295   Basic Metabolic Panel: Recent Labs  Lab 05/01/19 1131 05/02/19 0322  NA 142  --   K 3.3*  --   CL 91*  --   CO2 41*  --   GLUCOSE 75  --   BUN 19  --   CREATININE 0.84 0.85  CALCIUM 8.8*  --    GFR: Estimated Creatinine Clearance: 65.9 mL/min (by C-G formula based on SCr of 0.85 mg/dL). Liver Function Tests: Recent Labs  Lab 05/01/19 1131  AST 23  ALT 26  ALKPHOS 58  BILITOT 0.5  PROT 7.1  ALBUMIN 3.2*   No results for input(s): LIPASE, AMYLASE in the last 168 hours. No results for input(s): AMMONIA in the last 168 hours. Coagulation Profile: No results for input(s): INR, PROTIME in the last 168 hours. Cardiac Enzymes: No results for input(s): CKTOTAL, CKMB, CKMBINDEX, TROPONINI in the last 168 hours. BNP (last 3 results) No  results for input(s): PROBNP in the last 8760 hours. HbA1C: No results for input(s): HGBA1C in the last 72 hours. CBG: Recent Labs  Lab 05/01/19 2049 05/02/19 0756  GLUCAP 183* 89   Lipid Profile: Recent Labs    05/01/19 1131  TRIG 157*   Thyroid Function Tests: No results for input(s): TSH, T4TOTAL, FREET4, T3FREE, THYROIDAB in the last 72 hours. Anemia Panel: Recent Labs    05/01/19 1131  FERRITIN 22      Radiology Studies: I have reviewed all of the imaging during this hospital visit personally     Scheduled Meds: . dexamethasone (DECADRON) injection  6 mg Intravenous Q24H  . enoxaparin (LOVENOX) injection  40 mg Subcutaneous Q24H  . insulin aspart  0-9 Units Subcutaneous TID WC  . sodium chloride flush  3 mL Intravenous Q12H  . vitamin C  500 mg Oral Daily  . zinc sulfate  220 mg Oral Daily   Continuous Infusions: . sodium chloride    .  cefTRIAXone (ROCEPHIN)  IV    . [START ON 05/03/2019] remdesivir 100 mg in NS 250 mL       LOS: 1 day        Candelario Steppe Annett Gulaaniel Katlin Bortner, MD

## 2019-05-02 NOTE — Evaluation (Signed)
Clinical/Bedside Swallow Evaluation Patient Details  Name: LEATHA ROHNER MRN: [id number] Date of Birth: [birth year]  Today's Date: 05/02/2019 Time: SLP Start Time (ACUTE ONLY): 1027 SLP Stop Time (ACUTE ONLY): 1406 SLP Time Calculation (min) (ACUTE ONLY): 8 min  Past Medical History:  Past Medical History:  Diagnosis Date  . Chronic diastolic congestive heart failure ([place])   . COPD (chronic obstructive pulmonary disease) ([place])   . Diabetes mellitus without complication ([place])   . Hyperlipidemia   . Hypertension   . Hypothyroidism   . Morbid obesity ([place])   . Pulmonary hypertension ([place])    Past Surgical History:  Past Surgical History:  Procedure Laterality Date  . APPENDECTOMY    . CHOLECYSTECTOMY     HPI:  DANAISHA CELLI is a 78 y.o. female with medical history significant of copd on 3 liters chronically at snf, chf, htn comes in with worsening sob and hypoxia.  Denies cp or abd pain , no n/v/d.  Found to have Covid pna on cxr and referred for admission for such. No hx of dysphagia found in chart   Assessment / Plan / Recommendation Clinical Impression  Pt demonstrates ability to swallow without signs of dysphagia or aspiration. She is edentulous but is able to masticate a saltine appropriately with gums and tongue. Pt would benefit from soft solids given missing dentition and illness but no SLP f/u needed. Will order mechanical soft and thin liquids and sign off.  SLP Visit Diagnosis: Dysphagia, unspecified (R13.10)    Aspiration Risk  Mild aspiration risk    Diet Recommendation Dysphagia 3 (Mech soft);Thin liquid   Liquid Administration via: Cup;Straw Medication Administration: Crushed with puree Supervision: Patient able to self feed Compensations: Slow rate;Small sips/bites Postural Changes: Seated upright at 90 degrees    Other  Recommendations Oral Care Recommendations: Oral care BID   Follow up Recommendations Skilled Nursing facility      Frequency and  Duration            Prognosis        Swallow Study   General HPI: AVANELLE PIXLEY is a 78 y.o. female with medical history significant of copd on 3 liters chronically at snf, chf, htn comes in with worsening sob and hypoxia.  Denies cp or abd pain , no n/v/d.  Found to have Covid pna on cxr and referred for admission for such. No hx of dysphagia found in chart Type of Study: Bedside Swallow Evaluation Previous Swallow Assessment: none Diet Prior to this Study: Thin liquids Temperature Spikes Noted: No Respiratory Status: Nasal cannula History of Recent Intubation: No Behavior/Cognition: Alert;Cooperative;Pleasant mood Oral Cavity Assessment: Within Functional Limits Oral Care Completed by SLP: No Oral Cavity - Dentition: Edentulous Vision: Functional for self-feeding Self-Feeding Abilities: Able to feed self Patient Positioning: Upright in bed Baseline Vocal Quality: Normal Volitional Cough: Strong Volitional Swallow: Able to elicit    Oral/Motor/Sensory Function Overall Oral Motor/Sensory Function: Within functional limits   Ice Chips Ice chips: Not tested   Thin Liquid Thin Liquid: Within functional limits    Nectar Thick Nectar Thick Liquid: Not tested   Honey Thick Honey Thick Liquid: Not tested   Puree Puree: Within functional limits   Solid    Herbie Baltimore, MA CCC-SLP  Acute Rehabilitation Services Pager [phone] Office [phone]  Solid: Within functional limits Presentation: Self Fed      Lynann Beaver 05/02/2019,3:42 PM

## 2019-05-02 NOTE — H&P (Signed)
History and Physical    Sheila Hayes JOI:[id number] DOB: [birth year] DOA: 05/01/2019  PCP: Almetta Lovely, [facility]  Patient coming from: snf  Chief Complaint:  Low 02   HPI: Sheila Hayes is a 78 y.o. female with medical history significant of copd on 3 liters chronically at snf, chf, htn comes in with worsening sob and hypoxia.  Denies cp or abd pain , no n/v/d.  Was noted to be confused earlier, seems to clear at this time.  Feels better with her oxygen on.  fuond to have covid pna on cxr and referred for admission for such.   Review of Systems: As per HPI otherwise 10 point review of systems negative.   Past Medical History:  Diagnosis Date  . Chronic diastolic congestive heart failure (HCC)   . COPD (chronic obstructive pulmonary disease) (HCC)   . Diabetes mellitus without complication (HCC)   . Hyperlipidemia   . Hypertension   . Hypothyroidism   . Morbid obesity (HCC)   . Pulmonary hypertension (HCC)     Past Surgical History:  Procedure Laterality Date  . APPENDECTOMY    . CHOLECYSTECTOMY       reports that she has quit smoking. She has never used smokeless tobacco. She reports that she does not drink alcohol or use drugs.  No Known Allergies  Family History  Problem Relation Age of Onset  . Heart attack Mother   . Heart attack Father   . Diabetes Sister   . Hypertension Sister   . Hypertension Brother   . Diabetes Brother   . Lung cancer Brother   . Colon cancer Brother     Prior to Admission medications   Medication Sig Start Date End Date Taking? Authorizing Provider  acetaminophen (TYLENOL) 500 MG tablet Take 500 mg by mouth every 6 (six) hours as needed for headache.    [provider]  albuterol (PROVENTIL HFA;VENTOLIN HFA) 108 (90 Base) MCG/ACT inhaler Inhale 2 puffs into the lungs every 6 (six) hours as needed for wheezing or shortness of breath.    [provider]  budesonide-formoterol (SYMBICORT) 80-4.5 MCG/ACT  inhaler Inhale 2 puffs into the lungs 2 (two) times daily.    [provider]  calcium carbonate (OSCAL) 1500 (600 Ca) MG TABS tablet Take 600 mg of elemental calcium by mouth 2 (two) times daily with a meal.     [provider]  citalopram (CELEXA) 40 MG tablet Take 40 mg by mouth daily.    [provider]  docusate sodium (COLACE) 100 MG capsule Take 200 mg by mouth at bedtime.    [provider]  furosemide (LASIX) 20 MG tablet Take 20 mg by mouth daily.    [provider]  gabapentin (NEURONTIN) 100 MG capsule Take 200 mg by mouth 2 (two) times daily.    [provider]  glipiZIDE (GLUCOTROL) 10 MG tablet Take 10 mg by mouth daily before breakfast.    [provider]  ipratropium-albuterol (DUONEB) 0.5-2.5 (3) MG/3ML SOLN Take 3 mLs by nebulization 3 (three) times daily.    [provider]  levothyroxine (SYNTHROID, LEVOTHROID) 125 MCG tablet Take 125 mcg by mouth daily before breakfast.    [provider]  LORazepam (ATIVAN) 0.5 MG tablet Take 0.5 mg by mouth every 4 (four) hours as needed for anxiety.    [provider]  metFORMIN (GLUCOPHAGE) 500 MG tablet Take 1,000 mg by mouth 2 (two) times daily with a meal.  [provider]  metoprolol tartrate (LOPRESSOR) 25 MG tablet Take 0.5 tablets (12.5 mg total) by mouth 2 (two) times daily. Patient taking differently: Take 12.5 mg by mouth 2 (two) times daily. HOLD for SBP <100 or HR <60 05/20/16   Demetrios Loll, MD  nystatin (NYSTATIN) powder Apply topically 2 (two) times daily as needed. For fungal growth    [provider]  omeprazole (PRILOSEC) 20 MG capsule Take 20 mg by mouth daily.    [provider]  potassium chloride (K-DUR) 10 MEQ tablet Take 10 mEq by mouth daily.    [provider]  predniSONE (DELTASONE) 10 MG tablet Label  & dispense according to the schedule below. 5 Pills PO for 1 day then, 4 Pills PO for 1  day, 3 Pills PO for 1 day, 2 Pills PO for 1 day, 1 Pill PO for 1 days then STOP. Patient not taking: Reported on 05/01/2019 09/04/18   Henreitta Leber, MD  prochlorperazine (COMPAZINE) 10 MG tablet Take 10 mg by mouth every 6 (six) hours as needed for nausea or vomiting.    [provider]  rosuvastatin (CRESTOR) 40 MG tablet Take 40 mg by mouth every evening.     [provider]  tiotropium (SPIRIVA) 18 MCG inhalation capsule Place 18 mcg into inhaler and inhale daily.    [provider]    Physical Exam: Vitals:   05/01/19 2342  BP: 134/66  Temp: 98.9 F (37.2 C)  TempSrc: Axillary  SpO2: (P) 94%  Weight: 95.9 kg  Height: 5\' 7"  (1.702 m)      Constitutional: NAD, calm, comfortable Vitals:   05/01/19 2342  BP: 134/66  Temp: 98.9 F (37.2 C)  TempSrc: Axillary  SpO2: (P) 94%  Weight: 95.9 kg  Height: 5\' 7"  (1.702 m)   Eyes: PERRL, lids and conjunctivae normal ENMT: Mucous membranes are moist. Posterior pharynx clear of any exudate or lesions.Normal dentition.  Neck: normal, supple, no masses, no thyromegaly Respiratory: clear to auscultation bilaterally, no wheezing, no crackles. Normal respiratory effort. No accessory muscle use.  Cardiovascular: Regular rate and rhythm, no murmurs / rubs / gallops. No extremity edema. 2+ pedal pulses. No carotid bruits.  Abdomen: no tenderness, no masses palpated. No hepatosplenomegaly. Bowel sounds positive.  Musculoskeletal: no clubbing / cyanosis. No joint deformity upper and lower extremities. Good ROM, no contractures. Normal muscle tone.  Skin: no rashes, lesions, ulcers. No induration Neurologic: CN 2-12 grossly intact. Sensation intact, DTR normal. Strength 5/5 in all 4.  Psychiatric: Normal judgment and insight. Alert and oriented x 3. Normal mood.    Labs on Admission: I have personally reviewed following labs and imaging studies  CBC: Recent Labs  Lab 05/01/19 1131  WBC 6.2  NEUTROABS 5.1  HGB  9.8*  HCT 36.5  MCV 85.7  PLT 397   Basic Metabolic Panel: Recent Labs  Lab 05/01/19 1131  NA 142  K 3.3*  CL 91*  CO2 41*  GLUCOSE 75  BUN 19  CREATININE 0.84  CALCIUM 8.8*   GFR: Estimated Creatinine Clearance: 66.7 mL/min (by C-G formula based on SCr of 0.84 mg/dL). Liver Function Tests: Recent Labs  Lab 05/01/19 1131  AST 23  ALT 26  ALKPHOS 58  BILITOT 0.5  PROT 7.1  ALBUMIN 3.2*   No results for input(s): LIPASE, AMYLASE in the last 168 hours. No results for input(s): AMMONIA in the last 168 hours. Coagulation Profile: No results for input(s): INR, PROTIME in the last  168 hours. Cardiac Enzymes: No results for input(s): CKTOTAL, CKMB, CKMBINDEX, TROPONINI in the last 168 hours. BNP (last 3 results) No results for input(s): PROBNP in the last 8760 hours. HbA1C: No results for input(s): HGBA1C in the last 72 hours. CBG: Recent Labs  Lab 05/01/19 2049  GLUCAP 183*   Lipid Profile: Recent Labs    05/01/19 1131  TRIG 157*   Thyroid Function Tests: No results for input(s): TSH, T4TOTAL, FREET4, T3FREE, THYROIDAB in the last 72 hours. Anemia Panel: Recent Labs    05/01/19 1131  FERRITIN 22   Urine analysis:    Component Value Date/Time   COLORURINE AMBER (A) 05/01/2019 1132   APPEARANCEUR CLOUDY (A) 05/01/2019 1132   APPEARANCEUR Clear 07/24/2013 1152   LABSPEC 1.021 05/01/2019 1132   LABSPEC 1.010 07/24/2013 1152   PHURINE 5.0 05/01/2019 1132   GLUCOSEU NEGATIVE 05/01/2019 1132   GLUCOSEU Negative 07/24/2013 1152   HGBUR SMALL (A) 05/01/2019 1132   BILIRUBINUR NEGATIVE 05/01/2019 1132   BILIRUBINUR Negative 07/24/2013 1152   KETONESUR NEGATIVE 05/01/2019 1132   PROTEINUR 100 (A) 05/01/2019 1132   NITRITE POSITIVE (A) 05/01/2019 1132   LEUKOCYTESUR TRACE (A) 05/01/2019 1132   LEUKOCYTESUR Negative 07/24/2013 1152   Sepsis Labs: !!!!!!!!!!!!!!!!!!!!!!!!!!!!!!!!!!!!!!!!!!!! @LABRCNTIP (procalcitonin:4,lacticidven:4) )No results found for  this or any previous visit (from the past 240 hour(s)).   Radiological Exams on Admission: Dg Chest Port 1 View  Result Date: 05/01/2019 CLINICAL DATA:  Hypoxia.  COVID-19 positive. EXAM: PORTABLE CHEST 1 VIEW COMPARISON:  09/03/2018.  09/01/2018.06/14/2018.  03/03/2018. FINDINGS: Stable cardiomegaly. Prominent bilateral interstitial prominence. No pleural effusion or pneumothorax. No acute bony abnormality. IMPRESSION: 1. Prominent bilateral interstitial prominence. Pneumonitis could present in this fashion with known COVID-19 infection. 2.  Cardiomegaly. Electronically Signed   By: Maisie Fushomas  Register   On: 05/01/2019 11:57   Old chart reviewed cxr reviewed bilateral opacities present  Assessment/Plan 78 yo female with acute on chronic hypoxic respiratory failure secondary to covid pna  Principal Problem:   Pneumonia due to COVID-19 virus- pt agreeable to remdisivir and steroids for covid infection.  supplemental oxygen and wean as tolerates.  Daily labs ordered.  Active Problems:   Acute respiratory failure with hypoxia and hypercapnia (HCC)- as above   HTN (hypertension)- stable   Diabetes (HCC)- ssi   Chronic diastolic CHF (congestive heart failure) (HCC)- stable , clarify home meds   Urinary tract infection without hematuria- iv rocephin and f/u uc     DVT prophylaxis:  lovenox Code Status:  DNR Family Communication:  none Disposition Plan:  days Consults called:  none Admission status:  admission   Tahari Clabaugh A MD [facility]  If 7PM-7AM, please contact night-coverage [email or web address] Password [email or web address]  05/02/2019, 12:59 AM

## 2019-05-02 NOTE — Progress Notes (Signed)
Manufacturing engineer [facility]) hospital Liaison note. Patient is currently followed by [facility] at [facility] with a hospice diagnosis Heart disease. She is a DNR code, with out of facility DNR in place. She is COVID positive and  was transferred to [facility] last evening for treatment. CSW Kingsley Spittle made aware. Patient's baseline is ambulatory with a  Walker and 3 liters of oxygen via nasal cannula. Will continue to follow and update hospice team. [name] [facility], RN, [place] [phone]

## 2019-05-03 ENCOUNTER — Other Ambulatory Visit: Payer: Self-pay

## 2019-05-03 DIAGNOSIS — I10 Essential (primary) hypertension: Secondary | ICD-10-CM

## 2019-05-03 DIAGNOSIS — J9601 Acute respiratory failure with hypoxia: Secondary | ICD-10-CM

## 2019-05-03 DIAGNOSIS — J9602 Acute respiratory failure with hypercapnia: Secondary | ICD-10-CM

## 2019-05-03 DIAGNOSIS — I5032 Chronic diastolic (congestive) heart failure: Secondary | ICD-10-CM

## 2019-05-03 LAB — GLUCOSE, CAPILLARY
Glucose-Capillary: 138 mg/dL — ABNORMAL HIGH (ref 70–99)
Glucose-Capillary: 193 mg/dL — ABNORMAL HIGH (ref 70–99)
Glucose-Capillary: 117 mg/dL — ABNORMAL HIGH (ref 70–99)
Glucose-Capillary: 281 mg/dL — ABNORMAL HIGH (ref 70–99)

## 2019-05-03 LAB — COMPREHENSIVE METABOLIC PANEL
ALT: 18 U/L (ref 0–44)
AST: 13 U/L — ABNORMAL LOW (ref 15–41)
Albumin: 2.6 g/dL — ABNORMAL LOW (ref 3.5–5.0)
Alkaline Phosphatase: 45 U/L (ref 38–126)
Anion gap: 10 (ref 5–15)
BUN: 26 mg/dL — ABNORMAL HIGH (ref 8–23)
CO2: 41 mmol/L — ABNORMAL HIGH (ref 22–32)
Calcium: 8.7 mg/dL — ABNORMAL LOW (ref 8.9–10.3)
Chloride: 88 mmol/L — ABNORMAL LOW (ref 98–111)
Creatinine, Ser: 0.92 mg/dL (ref 0.44–1.00)
GFR calc Af Amer: >60 mL/min (ref >60)
GFR calc non Af Amer: >60 mL/min (ref >60)
Glucose, Bld: 191 mg/dL — ABNORMAL HIGH (ref 70–99)
Potassium: 3.5 mmol/L (ref 3.5–5.1)
Sodium: 139 mmol/L (ref 135–145)
Total Bilirubin: 0.6 mg/dL (ref 0.3–1.2)
Total Protein: 5.8 g/dL — ABNORMAL LOW (ref 6.5–8.1)

## 2019-05-03 LAB — C-REACTIVE PROTEIN: CRP: 24.7 mg/dL — ABNORMAL HIGH (ref <1.0)

## 2019-05-03 LAB — FERRITIN: Ferritin: 35 ng/mL (ref 11–307)

## 2019-05-03 LAB — D-DIMER, QUANTITATIVE: D-Dimer, Quant: 0.58 ug/mL-FEU — ABNORMAL HIGH (ref 0.00–0.50)

## 2019-05-03 MED ORDER — ENSURE MAX PROTEIN PO LIQD: 11.0000 [oz_av] | Freq: Every day | ORAL | Status: DC

## 2019-05-03 MED ORDER — DEXAMETHASONE SODIUM PHOSPHATE 10 MG/ML IJ SOLN
6.0000 mg | Freq: Two times a day (BID) | INTRAMUSCULAR | Status: DC
  Administered 2019-05-03 – 2019-05-08 (×10): 6 mg via INTRAVENOUS
  Filled 2019-05-03 (×10): qty 1

## 2019-05-03 MED ORDER — ADULT MULTIVITAMIN W/MINERALS CH
1.0000 | ORAL_TABLET | Freq: Every day | ORAL | Status: DC
  Administered 2019-05-03 – 2019-05-08 (×6): 1 via ORAL
  Filled 2019-05-03 (×6): qty 1

## 2019-05-03 MED ORDER — POTASSIUM CHLORIDE 20 MEQ/15ML (10%) PO SOLN
40.0000 meq | Freq: Once | ORAL | Status: AC
  Administered 2019-05-03: 40 meq via ORAL
  Filled 2019-05-03: qty 30

## 2019-05-03 MED ORDER — ENSURE MAX PROTEIN PO LIQD
11.0000 [oz_av] | Freq: Two times a day (BID) | ORAL | Status: DC
  Administered 2019-05-03 – 2019-05-08 (×11): 11 [oz_av] via ORAL
  Filled 2019-05-03 (×13): qty 330

## 2019-05-03 NOTE — Progress Notes (Signed)
I called the pt's niece and gave her an update about the pt, all questions answered. will continue to monitor

## 2019-05-03 NOTE — Progress Notes (Addendum)
PROGRESS NOTE  Sheila Hayes  QMG:[id number] DOB: 26-Oct-1940 DOA: 05/01/2019 PCP: Orvis Brill, [facility]   Brief Narrative: Sheila Hayes is a 78 y.o. female with a history of 3L oxygen-dependent COPD, chronic CHF, T2DMand HTN currently on hospice at ALF who was diagnosed with covid-19 on 11/4 and presented with confusion and dyspnea on 11/9, found to have worsened hypoxia, elevated inflammatory markers, and bilateral infiltrates on CXR. She was admitted, started on remdesivir and steroids. Developed new-onset atrial fibrillation easily rate-controlled with beta blocker and started on lovenox for stroke risk reduction.   Assessment & Plan: Principal Problem:   Pneumonia due to COVID-19 virus Active Problems:   HTN (hypertension)   Diabetes ([place])   Chronic diastolic CHF (congestive heart failure) (HCC)   Acute respiratory failure with hypoxia and hypercapnia (HCC)   Urinary tract infection without hematuria  Acute on chronic hypoxic respiratory failure due to covid-19 pneumonia: Felt to be at risk of progression to ARDS. - Continue remdesivir 11/10 - 11/14. ALT wnl. - Continue steroids. CRP remains grossly elevated, augment dosing.  - Continue airborne, contact precautions.  - Check daily labs: CBC w/diff, CMP, d-dimer, ferritin, CRP - Enoxaparin as below - Maintain euvolemia/net negative.  - Avoid NSAIDs - Recommend proning and aggressive use of incentive spirometry. - Goals of care were discussed. Pt is on hospice and is DNR/DNI, though this hospitalization and treatments listed above are within her desired goals of care. Prognosis is guarded.   COPD with acute exacerbation:  - continue bronchodilators and steroids  New onset atrial fibrillation with RVR: Ventricular response controlled with addition of AV nodal agent. Has now converted back to sinus rhythm. - Continue metoprolol 25mg  po BID - Continue anticoagulation with CHA2DS2-VASc score of 6. - Monitor and replete  K and Mg as needed.  T2DM:  - Continue SSI  Dyslipidemia:  - Continue crestor  Chronic HFpEF:  - Continue home medications  Hypothyroidism:  - Continue synthroid. Check TSH since new onset AFib.   Obesity: BMI 33. Noted.   Asymptomatic pyuria: Will not treat.  DVT prophylaxis: Lovenox Code Status: DNR Family Communication: None at bedside Disposition Plan: Uncertain, pending PT/OT  Consultants:   None  Procedures:   None  Antimicrobials:  Remdesivir   Subjective: Feels well, breathing better than admission, wants to go back home. No chest pain. Very appreciative of care she's received thus far.   Objective: Vitals:   05/03/19 0733 05/03/19 0815 05/03/19 1105 05/03/19 1622  BP: (!) 109/53  (!) 106/55 (!) 114/52  Pulse: 72 72 78 80  Resp: (!) 22 (!) 22 19 18   Temp: 98.6 F (37 C)  98.6 F (37 C) 97.6 F (36.4 C)  TempSrc: Oral  Oral Oral  SpO2: 90% 92% 96% 97%  Weight:      Height:        Intake/Output Summary (Last 24 hours) at 05/03/2019 1628 Last data filed at 05/03/2019 1546 Gross per 24 hour  Intake 610 ml  Output 950 ml  Net -340 ml   Filed Weights   05/01/19 2342  Weight: 95.9 kg    Gen: 78 y.o. female in no distress Pulm: Non-labored breathing supplemental oxygen. Diminished and clear bilaterally.  CV: Regular rate and rhythm. No murmur, rub, or gallop. No JVD, trace pedal edema. GI: Abdomen soft, non-tender, non-distended, with normoactive bowel sounds. No organomegaly or masses felt. Ext: Warm, no deformities Skin: No rashes, lesions or ulcers on visualized skin.  Neuro: Alert and oriented  x3. No focal neurological deficits. Psych: Judgement and insight appear fair. Mood & affect appropriate.   Data Reviewed: I have personally reviewed following labs and imaging studies  CBC: Recent Labs  Lab 05/01/19 1131 05/02/19 0322  WBC 6.2 6.9  NEUTROABS 5.1  --   HGB 9.8* 8.7*  HCT 36.5 32.9*  MCV 85.7 90.4  PLT 208 213   Basic  Metabolic Panel: Recent Labs  Lab 05/01/19 1131 05/02/19 0322 05/03/19 0150  NA 142  --  139  K 3.3*  --  3.5  CL 91*  --  88*  CO2 41*  --  41*  GLUCOSE 75  --  191*  BUN 19  --  26*  CREATININE 0.84 0.85 0.92  CALCIUM 8.8*  --  8.7*   GFR: Estimated Creatinine Clearance: 60.9 mL/min (by C-G formula based on SCr of 0.92 mg/dL). Liver Function Tests: Recent Labs  Lab 05/01/19 1131 05/03/19 0150  AST 23 13*  ALT 26 18  ALKPHOS 58 45  BILITOT 0.5 0.6  PROT 7.1 5.8*  ALBUMIN 3.2* 2.6*   No results for input(s): LIPASE, AMYLASE in the last 168 hours. No results for input(s): AMMONIA in the last 168 hours. Coagulation Profile: No results for input(s): INR, PROTIME in the last 168 hours. Cardiac Enzymes: No results for input(s): CKTOTAL, CKMB, CKMBINDEX, TROPONINI in the last 168 hours. BNP (last 3 results) No results for input(s): PROBNP in the last 8760 hours. HbA1C: Recent Labs    05/02/19 0322  HGBA1C 5.4   CBG: Recent Labs  Lab 05/02/19 1149 05/02/19 1631 05/02/19 2032 05/03/19 0733 05/03/19 1106  GLUCAP 158* 139* 194* 138* 193*   Lipid Profile: Recent Labs    05/01/19 1131  TRIG 157*   Thyroid Function Tests: No results for input(s): TSH, T4TOTAL, FREET4, T3FREE, THYROIDAB in the last 72 hours. Anemia Panel: Recent Labs    05/01/19 1131 05/03/19 0150  FERRITIN 22 35   Urine analysis:    Component Value Date/Time   COLORURINE AMBER (A) 05/01/2019 1132   APPEARANCEUR CLOUDY (A) 05/01/2019 1132   APPEARANCEUR Clear 07/24/2013 1152   LABSPEC 1.021 05/01/2019 1132   LABSPEC 1.010 07/24/2013 1152   PHURINE 5.0 05/01/2019 1132   GLUCOSEU NEGATIVE 05/01/2019 1132   GLUCOSEU Negative 07/24/2013 1152   HGBUR SMALL (A) 05/01/2019 1132   BILIRUBINUR NEGATIVE 05/01/2019 1132   BILIRUBINUR Negative 07/24/2013 1152   KETONESUR NEGATIVE 05/01/2019 1132   PROTEINUR 100 (A) 05/01/2019 1132   NITRITE POSITIVE (A) 05/01/2019 1132   LEUKOCYTESUR TRACE  (A) 05/01/2019 1132   LEUKOCYTESUR Negative 07/24/2013 1152   Recent Results (from the past 240 hour(s))  Blood Culture (routine x 2)     Status: None (Preliminary result)   Collection Time: 05/01/19 11:28 AM   Specimen: BLOOD  Result Value Ref Range Status   Specimen Description BLOOD BLOOD LEFT FOREARM  Final   Special Requests   Final    BOTTLES DRAWN AEROBIC AND ANAEROBIC Blood Culture adequate volume   Culture   Final    NO GROWTH 2 DAYS Performed at [facility], [address] [place], [place] [address]    Report Status PENDING  Incomplete  Urine Culture     Status: Abnormal (Preliminary result)   Collection Time: 05/01/19 11:28 AM   Specimen: Urine, Random  Result Value Ref Range Status   Specimen Description   Final    URINE, RANDOM Performed at [facility], [address] [place],  [place] [address]    Special Requests   Final    Normal Performed at [facility], [address] [place], [place] [address]    Culture (A)  Final    >=100,000 COLONIES/mL CITROBACTER BRAAKII SUSCEPTIBILITIES TO FOLLOW Performed at [facility], [address] [place], [place] [address]    Report Status PENDING  Incomplete  Blood Culture (routine x 2)     Status: None (Preliminary result)   Collection Time: 05/01/19 11:31 AM   Specimen: BLOOD  Result Value Ref Range Status   Specimen Description BLOOD RIGHT ANTECUBITAL  Final   Special Requests   Final    BOTTLES DRAWN AEROBIC AND ANAEROBIC Blood Culture adequate volume   Culture   Final    NO GROWTH 2 DAYS Performed at [facility], [address] [place], [place] [address]    Report Status PENDING  Incomplete      Radiology Studies: No results found.  Scheduled Meds: . calcium carbonate  500 mg of elemental calcium Oral BID WC  . chlorpheniramine-HYDROcodone  5 mL Oral Q12H  . citalopram  40 mg Oral Daily  . dexamethasone (DECADRON) injection  6 mg Intravenous Q24H  .  docusate sodium  200 mg Oral QHS  . enoxaparin (LOVENOX) injection  1 mg/kg Subcutaneous Q12H  . furosemide  20 mg Oral Daily  . gabapentin  200 mg Oral BID  . insulin aspart  0-9 Units Subcutaneous TID WC  . Ipratropium-Albuterol  1 puff Inhalation Q6H  . levothyroxine  125 mcg Oral QAC breakfast  . metoprolol tartrate  25 mg Oral BID  . mometasone-formoterol  2 puff Inhalation BID  . multivitamin with minerals  1 tablet Oral Daily  . pantoprazole  40 mg Oral Daily  . Ensure Max Protein  11 oz Oral BID  . rosuvastatin  40 mg Oral QPM  . sodium chloride flush  3 mL Intravenous Q12H  . vitamin C  500 mg Oral Daily  . zinc sulfate  220 mg Oral Daily   Continuous Infusions: . sodium chloride    . remdesivir 100 mg in NS 250 mL 100 mg (05/03/19 0811)     LOS: 2 days   Time spent: 25 minutes.  Tyrone Nineyan B Gene Colee, MD [facility] [email or web address] 05/03/2019, 4:28 PM

## 2019-05-03 NOTE — Plan of Care (Signed)
  Problem: Education: Goal: Knowledge of risk factors and measures for prevention of condition will improve Outcome: Progressing   Problem: Coping: Goal: Psychosocial and spiritual needs will be supported Outcome: Progressing   Problem: Respiratory: Goal: Will maintain a patent airway Outcome: Progressing Goal: Complications related to the disease process, condition or treatment will be avoided or minimized Outcome: Progressing   

## 2019-05-03 NOTE — Progress Notes (Signed)
Initial Nutrition Assessment  DOCUMENTATION CODES:   Obesity unspecified  INTERVENTION:   Ensure Max po BID, each supplement provides 150 kcal and 30 grams of protein.  Multivitamin with minerals daily. Pt receiving Hormel Shake daily with Breakfast which provides 520 kcals and 22 g of protein and Magic cup BID with lunch and dinner, each supplement provides 290 kcal and 9 grams of protein, automatically on meal trays to optimize nutritional intake.   NUTRITION DIAGNOSIS:   Increased nutrient needs related to acute illness(COVID) as evidenced by estimated needs.  GOAL:   Patient will meet greater than or equal to 90% of their needs  MONITOR:   PO intake, Supplement acceptance, Labs, Skin  REASON FOR ASSESSMENT:   Malnutrition Screening Tool    ASSESSMENT:   78 yo female admitted with hypoxemia r/t COVID PNA. PMH includes COPD, chronic respiratory failure, 3L [place] at nursing facility, CHF, HTN.   S/P bedside swallow evaluation with SLP. Diet advanced to dysphagia 3 (mechanical soft) with thin liquids. Patient is consuming 50-100% of meals.  Patient is followed by Hospice for her CHF diagnosis.   Labs reviewed. CBG's: [phone] Medications reveiwed and include oscal, decadron, colace, lasix, novolog, vitamin C, zinc.  Usual weights reviewed. Patient with 9% weight loss within the past 8 months, not significant for the time frame.   NUTRITION - FOCUSED PHYSICAL EXAM:  deferred  Diet Order:   Diet Order            DIET DYS 3 Room service appropriate? Yes; Fluid consistency: Thin  Diet effective now              EDUCATION NEEDS:   Not appropriate for education at this time  Skin:  Skin Assessment: Skin Integrity Issues: Skin Integrity Issues:: Other (Comment) Other: head laceration, non pressure wound to vertebral column  Last BM:  no BM documented  Height:   Ht Readings from Last 1 Encounters:  05/01/19 5\' 7"  (1.702 m)    Weight:   Wt Readings  from Last 1 Encounters:  05/01/19 95.9 kg    Ideal Body Weight:  61.4 kg  BMI:  Body mass index is 33.13 kg/m.  Estimated Nutritional Needs:   Kcal:  1500-1700  Protein:  95-110 gm  Fluid:  >/= 1.6 L    Molli Barrows, RD, LDN, [place] Pager [phone] After Hours Pager [phone]

## 2019-05-04 ENCOUNTER — Encounter (HOSPITAL_COMMUNITY): Payer: Self-pay | Admitting: Family Medicine

## 2019-05-04 LAB — COMPREHENSIVE METABOLIC PANEL
ALT: 15 U/L (ref 0–44)
AST: 13 U/L — ABNORMAL LOW (ref 15–41)
Albumin: 2.6 g/dL — ABNORMAL LOW (ref 3.5–5.0)
Alkaline Phosphatase: 44 U/L (ref 38–126)
Anion gap: 9 (ref 5–15)
BUN: 27 mg/dL — ABNORMAL HIGH (ref 8–23)
CO2: 43 mmol/L — ABNORMAL HIGH (ref 22–32)
Calcium: 8.7 mg/dL — ABNORMAL LOW (ref 8.9–10.3)
Chloride: 88 mmol/L — ABNORMAL LOW (ref 98–111)
Creatinine, Ser: 0.7 mg/dL (ref 0.44–1.00)
GFR calc Af Amer: >60 mL/min (ref >60)
GFR calc non Af Amer: >60 mL/min (ref >60)
Glucose, Bld: 253 mg/dL — ABNORMAL HIGH (ref 70–99)
Potassium: 3.9 mmol/L (ref 3.5–5.1)
Sodium: 140 mmol/L (ref 135–145)
Total Bilirubin: 0.6 mg/dL (ref 0.3–1.2)
Total Protein: 6.1 g/dL — ABNORMAL LOW (ref 6.5–8.1)

## 2019-05-04 LAB — URINE CULTURE
Culture: >=100000 — AB
Special Requests: NORMAL

## 2019-05-04 LAB — D-DIMER, QUANTITATIVE: D-Dimer, Quant: 0.49 ug/mL-FEU (ref 0.00–0.50)

## 2019-05-04 LAB — GLUCOSE, CAPILLARY
Glucose-Capillary: 172 mg/dL — ABNORMAL HIGH (ref 70–99)
Glucose-Capillary: 232 mg/dL — ABNORMAL HIGH (ref 70–99)
Glucose-Capillary: 171 mg/dL — ABNORMAL HIGH (ref 70–99)
Glucose-Capillary: 196 mg/dL — ABNORMAL HIGH (ref 70–99)

## 2019-05-04 LAB — C-REACTIVE PROTEIN: CRP: 17.8 mg/dL — ABNORMAL HIGH (ref <1.0)

## 2019-05-04 LAB — MAGNESIUM: Magnesium: 1.6 mg/dL — ABNORMAL LOW (ref 1.7–2.4)

## 2019-05-04 LAB — TSH: TSH: 0.366 u[IU]/mL (ref 0.350–4.500)

## 2019-05-04 LAB — FERRITIN: Ferritin: 40 ng/mL (ref 11–307)

## 2019-05-04 MED ORDER — METOPROLOL TARTRATE 25 MG PO TABS
12.5000 mg | ORAL_TABLET | Freq: Two times a day (BID) | ORAL | Status: DC
  Administered 2019-05-04 – 2019-05-08 (×8): 12.5 mg via ORAL
  Filled 2019-05-04 (×8): qty 1

## 2019-05-04 MED ORDER — SODIUM CHLORIDE 0.9 % IV BOLUS: 500.0000 mL | Freq: Once | INTRAVENOUS | Status: DC

## 2019-05-04 MED ORDER — POTASSIUM CHLORIDE 20 MEQ/15ML (10%) PO SOLN
10.0000 meq | Freq: Once | ORAL | Status: AC
  Administered 2019-05-04: 08:00:00 10 meq via ORAL
  Filled 2019-05-04: qty 15

## 2019-05-04 MED ORDER — MAGNESIUM SULFATE 2 GM/50ML IV SOLN
2.0000 g | Freq: Once | INTRAVENOUS | Status: AC
  Administered 2019-05-04: 2 g via INTRAVENOUS
  Filled 2019-05-04: qty 50

## 2019-05-04 MED ORDER — INSULIN ASPART 100 UNIT/ML ~~LOC~~ SOLN
0.0000 [IU] | Freq: Every day | SUBCUTANEOUS | Status: DC
  Administered 2019-05-06 – 2019-05-07 (×2): 2 [IU] via SUBCUTANEOUS

## 2019-05-04 MED ORDER — INSULIN ASPART 100 UNIT/ML ~~LOC~~ SOLN
0.0000 [IU] | Freq: Three times a day (TID) | SUBCUTANEOUS | Status: DC
  Administered 2019-05-04: 4 [IU] via SUBCUTANEOUS
  Administered 2019-05-04: 12:00:00 7 [IU] via SUBCUTANEOUS
  Administered 2019-05-05: 11 [IU] via SUBCUTANEOUS
  Administered 2019-05-05: 4 [IU] via SUBCUTANEOUS
  Administered 2019-05-05: 15 [IU] via SUBCUTANEOUS
  Administered 2019-05-06: 11 [IU] via SUBCUTANEOUS
  Administered 2019-05-06: 7 [IU] via SUBCUTANEOUS
  Administered 2019-05-06: 15 [IU] via SUBCUTANEOUS
  Administered 2019-05-07: 7 [IU] via SUBCUTANEOUS
  Administered 2019-05-07 – 2019-05-08 (×3): 11 [IU] via SUBCUTANEOUS
  Administered 2019-05-08: 4 [IU] via SUBCUTANEOUS

## 2019-05-04 NOTE — Progress Notes (Addendum)
PROGRESS NOTE  Sheila Hayes  EVO:[id number] DOB: 24-Oct-1940 DOA: 05/01/2019 PCP: Orvis Brill[facility]   Brief Narrative: Sheila Hayes is a 78 y.o. female with a history of 3L oxygen-dependent COPD, chronic CHF, T2DMand HTN currently on hospice at ALF who was diagnosed with covid-19 on 11/4 and presented with confusion and dyspnea on 11/9, found to have worsened hypoxia, elevated inflammatory markers, and bilateral infiltrates on CXR. She was admitted, started on remdesivir and steroids. Developed new-onset atrial fibrillation easily rate-controlled with beta blocker and started on lovenox for stroke risk reduction.   Assessment & Plan: Principal Problem:   Pneumonia due to COVID-19 virus Active Problems:   HTN (hypertension)   Diabetes ([place])   Chronic diastolic CHF (congestive heart failure) (HCC)   Acute respiratory failure with hypoxia and hypercapnia (HCC)   Urinary tract infection without hematuria  Acute on chronic hypoxic respiratory failure due to covid-19 pneumonia: Felt to be at risk of progression to ARDS. - Continue remdesivir 11/10 - 11/14. ALT wnl. - Continue steroids. CRP remains grossly elevated, so continued augmented dosing. - Holding CCP due to history of CHF and stable symptoms. - Continue airborne, contact precautions.  - Check daily labs: CMP, d-dimer, ferritin, CRP - Enoxaparin as below - Maintain euvolemia/net negative.  - Avoid NSAIDs - Recommend proning and aggressive use of incentive spirometry. - Goals of care were discussed. Pt is on hospice and is DNR/DNI, though this hospitalization and treatments listed above are within her desired goals of care. Prognosis is guarded.   COPD with acute exacerbation:  - Continue bronchodilators and steroids  New onset atrial fibrillation with RVR: Ventricular response controlled with addition of AV nodal agent. Has now converted back to sinus rhythm. - Continue metoprolol 25mg  po BID - Continue  anticoagulation with CHA2DS2-VASc score of 6. - Monitor and replete K and Mg as needed. Give K 26mEq and Mg 2g, continue checking - Continue telemetry for now. Personally reviewed today with stable NSR w/BBB since conversion from AFib w/RVR on 11/10.  T2DM:  - Continue SSI, augment to resistant SSI and add HS coverage due to hyperglycemia worse with augmented steroids and use of ensure. If difficult to manage, may need to change to glucerna. Will monitor and consider addition of basal insulin this PM.   Dyslipidemia:  - Continue crestor  Chronic HFpEF:  - Continue home medications  Hypomagnesemia:  - Supplement and recheck.  Hypothyroidism: TSH 0.366. - Continue synthroid.   Obesity: BMI 33. Noted.   Asymptomatic pyuria: Will not treat.  DVT prophylaxis: Lovenox Code Status: DNR Family Communication: None at bedside. Niece contacted for updates by RN staff yesterday. Will touch base as indicated. Disposition Plan: Uncertain, pending PT/OT  Consultants:   None  Procedures:   None  Antimicrobials:  Remdesivir 11/10 - 11/14  Subjective: Confused, has no complaints this morning. No chest pain, denies shortness of breath.   Objective: Vitals:   05/04/19 0350 05/04/19 0500 05/04/19 0559 05/04/19 0727  BP: (!) 112/50   119/60  Pulse: 92   75  Resp: 16   16  Temp: 98.6 F (37 C)   99.1 F (37.3 C)  TempSrc: Oral   Oral  SpO2: 95% (!) 86% 90% 92%  Weight:      Height:        Intake/Output Summary (Last 24 hours) at 05/04/2019 0759 Last data filed at 05/04/2019 0400 Gross per 24 hour  Intake 1090 ml  Output 1625 ml  Net -535 ml  Filed Weights   05/01/19 2342  Weight: 95.9 kg   Gen: 78 y.o. female in no distress Pulm: Nonlabored breathing 3L O2. Clear, diminished, no wheezing. CV: Regular rate and rhythm. No murmur, rub, or gallop. No JVD, no dependent edema. GI: Abdomen soft, non-tender, non-distended, with normoactive bowel sounds.  Ext: Warm, no  deformities Skin: No new rashes, lesions or ulcers on visualized skin. Neuro: Alert and oriented. Moves all extremities. Psych: Judgement and insight appear slightly impaired. Mood euthymic & affect congruent. Behavior is appropriate.    Data Reviewed: I have personally reviewed following labs and imaging studies  CBC: Recent Labs  Lab 05/01/19 1131 05/02/19 0322  WBC 6.2 6.9  NEUTROABS 5.1  --   HGB 9.8* 8.7*  HCT 36.5 32.9*  MCV 85.7 90.4  PLT 208 213   Basic Metabolic Panel: Recent Labs  Lab 05/01/19 1131 05/02/19 0322 05/03/19 0150 05/04/19 0330  NA 142  --  139 140  K 3.3*  --  3.5 3.9  CL 91*  --  88* 88*  CO2 41*  --  41* 43*  GLUCOSE 75  --  191* 253*  BUN 19  --  26* 27*  CREATININE 0.84 0.85 0.92 0.70  CALCIUM 8.8*  --  8.7* 8.7*  MG  --   --   --  1.6*   GFR: Estimated Creatinine Clearance: 70 mL/min (by C-G formula based on SCr of 0.7 mg/dL). Liver Function Tests: Recent Labs  Lab 05/01/19 1131 05/03/19 0150 05/04/19 0330  AST 23 13* 13*  ALT 26 18 15   ALKPHOS 58 45 44  BILITOT 0.5 0.6 0.6  PROT 7.1 5.8* 6.1*  ALBUMIN 3.2* 2.6* 2.6*   No results for input(s): LIPASE, AMYLASE in the last 168 hours. No results for input(s): AMMONIA in the last 168 hours. Coagulation Profile: No results for input(s): INR, PROTIME in the last 168 hours. Cardiac Enzymes: No results for input(s): CKTOTAL, CKMB, CKMBINDEX, TROPONINI in the last 168 hours. BNP (last 3 results) No results for input(s): PROBNP in the last 8760 hours. HbA1C: Recent Labs    05/02/19 0322  HGBA1C 5.4   CBG: Recent Labs  Lab 05/02/19 2032 05/03/19 0733 05/03/19 1106 05/03/19 1624 05/03/19 2027  GLUCAP 194* 138* 193* 117* 281*   Lipid Profile: Recent Labs    05/01/19 1131  TRIG 157*   Thyroid Function Tests: Recent Labs    05/04/19 0330  TSH 0.366   Anemia Panel: Recent Labs    05/03/19 0150 05/04/19 0330  FERRITIN 35 40   Urine analysis:    Component Value  Date/Time   COLORURINE AMBER (A) 05/01/2019 1132   APPEARANCEUR CLOUDY (A) 05/01/2019 1132   APPEARANCEUR Clear 07/24/2013 1152   LABSPEC 1.021 05/01/2019 1132   LABSPEC 1.010 07/24/2013 1152   PHURINE 5.0 05/01/2019 1132   GLUCOSEU NEGATIVE 05/01/2019 1132   GLUCOSEU Negative 07/24/2013 1152   HGBUR SMALL (A) 05/01/2019 1132   BILIRUBINUR NEGATIVE 05/01/2019 1132   BILIRUBINUR Negative 07/24/2013 1152   KETONESUR NEGATIVE 05/01/2019 1132   PROTEINUR 100 (A) 05/01/2019 1132   NITRITE POSITIVE (A) 05/01/2019 1132   LEUKOCYTESUR TRACE (A) 05/01/2019 1132   LEUKOCYTESUR Negative 07/24/2013 1152   Recent Results (from the past 240 hour(s))  Blood Culture (routine x 2)     Status: None (Preliminary result)   Collection Time: 05/01/19 11:28 AM   Specimen: BLOOD  Result Value Ref Range Status   Specimen Description BLOOD BLOOD LEFT FOREARM  Final  Special Requests   Final    BOTTLES DRAWN AEROBIC AND ANAEROBIC Blood Culture adequate volume   Culture   Final    NO GROWTH 2 DAYS Performed at [facility], [address] [place], [place] [address]    Report Status PENDING  Incomplete  Urine Culture     Status: Abnormal (Preliminary result)   Collection Time: 05/01/19 11:28 AM   Specimen: Urine, Random  Result Value Ref Range Status   Specimen Description   Final    URINE, RANDOM Performed at [facility], [address] [place], [place] [address]    Special Requests   Final    Normal Performed at [facility], [address] [place], [place] [address]    Culture (A)  Final    >=100,000 COLONIES/mL CITROBACTER BRAAKII SUSCEPTIBILITIES TO FOLLOW Performed at [facility], [address] [place], [place] [address]    Report Status PENDING  Incomplete  Blood Culture (routine x 2)     Status: None (Preliminary result)   Collection Time: 05/01/19 11:31 AM   Specimen: BLOOD  Result Value Ref Range Status   Specimen Description BLOOD RIGHT  ANTECUBITAL  Final   Special Requests   Final    BOTTLES DRAWN AEROBIC AND ANAEROBIC Blood Culture adequate volume   Culture   Final    NO GROWTH 2 DAYS Performed at [facility], [address] [place], [place] [address]    Report Status PENDING  Incomplete      Radiology Studies: No results found.  Scheduled Meds: . calcium carbonate  500 mg of elemental calcium Oral BID WC  . chlorpheniramine-HYDROcodone  5 mL Oral Q12H  . citalopram  40 mg Oral Daily  . dexamethasone (DECADRON) injection  6 mg Intravenous Q12H  . docusate sodium  200 mg Oral QHS  . enoxaparin (LOVENOX) injection  1 mg/kg Subcutaneous Q12H  . furosemide  20 mg Oral Daily  . gabapentin  200 mg Oral BID  . insulin aspart  0-9 Units Subcutaneous TID WC  . Ipratropium-Albuterol  1 puff Inhalation Q6H  . levothyroxine  125 mcg Oral QAC breakfast  . metoprolol tartrate  25 mg Oral BID  . mometasone-formoterol  2 puff Inhalation BID  . multivitamin with minerals  1 tablet Oral Daily  . pantoprazole  40 mg Oral Daily  . Ensure Max Protein  11 oz Oral BID  . rosuvastatin  40 mg Oral QPM  . sodium chloride flush  3 mL Intravenous Q12H  . vitamin C  500 mg Oral Daily  . zinc sulfate  220 mg Oral Daily   Continuous Infusions: . sodium chloride    . remdesivir 100 mg in NS 250 mL 100 mg (05/03/19 0811)     LOS: 3 days   Time spent: 25 minutes.  Tyrone Nineyan B Tabita Corbo, MD [facility] [email or web address] 05/04/2019, 7:59 AM

## 2019-05-04 NOTE — Progress Notes (Signed)
Occupational Therapy Evaluation Patient Details Name: Sheila Hayes MRN: [id number] DOB: Aug 03, 1940 Today's Date: 05/04/2019    History of Present Illness Pt is a 78 y.o. female currently on hospice at ALF admitted 05/01/19 with worsening SOB and AMS; initial (+) COVID-19 on 11/4. Developed new onset afib. PMH includes COPD (on 3L O2 baseline), CHF, DM2, HTN.   Clinical Impression   Pt admitted from SNF. Will need to discuss PLOF with SNF. Pt currently mod A with mobility and mod to Max A with ADL. SpO2 desat into 80s on 4L with mobility. Low BP with systolic 85 -see doc flow. Will need to return to SNF for rehab. Unsure if she was receiving hospice services at [facility] - will further assess. Will follow acutely.    Follow Up Recommendations  SNF;Supervision/Assistance - 24 hour    Equipment Recommendations  None recommended by OT    Recommendations for Other Services       Precautions / Restrictions Precautions Precautions: Fall Restrictions Weight Bearing Restrictions: No      Mobility Bed Mobility Overal bed mobility: Needs Assistance Bed Mobility: Sit to Supine     Supine to sit: Min assist;HOB elevated Sit to supine: Mod assist   General bed mobility comments: Attempted to lay down toward the foot of the bed  Transfers Overall transfer level: Needs assistance Equipment used: Rolling walker (2 wheeled) Transfers: Sit to/from [facility] Sit to Stand: Mod assist Stand pivot transfers: Min assist       General transfer comment: MinA for trunk elevation and to stabilize RW    Balance Overall balance assessment: Needs assistance Sitting-balance support: Feet supported Sitting balance-[name] Scale: Fair       Standing balance-[name] Scale: Poor Standing balance comment: Reliant on UE support                           ADL either performed or assessed with clinical judgement   ADL Overall ADL's : Needs  assistance/impaired Eating/Feeding: Set up Eating/Feeding Details (indicate cue type and reason): noted poor PO intake Grooming: Minimal assistance;Sitting   Upper Body Bathing: Minimal assistance;Sitting   Lower Body Bathing: Maximal assistance;Sit to/from stand   Upper Body Dressing : Moderate assistance;Sitting   Lower Body Dressing: Maximal assistance;Sit to/from stand   Toilet Transfer: Moderate assistance;BSC(stand step)   Toileting- Clothing Manipulation and Hygiene: Maximal assistance       Functional mobility during ADLs: Rolling walker;Cueing for safety;Moderate assistance(from lower level of chair)       Vision         Perception     Praxis      Pertinent Vitals/Pain Pain Assessment: Faces Faces Pain Scale: Hurts little more Pain Location: Buttocks with sitting Pain Descriptors / Indicators: Aching;Discomfort;Grimacing;Moaning Pain Intervention(s): Limited activity within patient's tolerance     Hand Dominance Right   Extremity/Trunk Assessment Upper Extremity Assessment Upper Extremity Assessment: Generalized weakness   Lower Extremity Assessment Lower Extremity Assessment: Defer to PT evaluation   Cervical / Trunk Assessment Cervical / Trunk Assessment: Normal   Communication Communication Communication: HOH   Cognition Arousal/Alertness: Awake/alert Behavior During Therapy: Agitated Overall Cognitive Status: No family/caregiver present to determine baseline cognitive functioning Area of Impairment: Orientation;Attention;Memory;Following commands;Safety/judgement;Awareness;Problem solving                 Orientation Level: Disoriented to;Place;Time;Situation Current Attention Level: Selective Memory: Decreased recall of precautions;Decreased short-term memory Following Commands: Follows one step commands with increased time  Safety/Judgement: Decreased awareness of safety;Decreased awareness of deficits Awareness:  Intellectual Problem Solving: Slow processing;Decreased initiation General Comments: states she has been kidnapped adn brought to "this place"   General Comments  SpO2 desat into low 80s; tried earlobe probe; poor pleth - pt pulling probe off her finger    Exercises     Shoulder Instructions      Home Living Family/patient expects to be discharged to:: Assisted living                             Home Equipment: Walker - 4 wheels   Additional Comments: Pt poor historian. Per chart, from [facility] on hospice care      Prior Functioning/Environment Level of Independence: Needs assistance  Gait / Transfers Assistance Needed: Pt poor historian, reports ambulatory with RW ADL's / Homemaking Assistance Needed: Pt reports indep with ADL tasks; likely requiring assist from staff            OT Problem List: Decreased strength;Decreased activity tolerance;Impaired balance (sitting and/or standing);Decreased cognition;Decreased safety awareness;Decreased knowledge of use of DME or AE;Cardiopulmonary status limiting activity;Obesity;Pain      OT Treatment/Interventions: Self-care/ADL training;Therapeutic exercise;Neuromuscular education;Energy conservation;DME and/or AE instruction;Therapeutic activities;Cognitive remediation/compensation;Patient/family education;Balance training    OT Goals(Current goals can be found in the care plan section) Acute Rehab OT Goals Patient Stated Goal: to get in the bed OT Goal Formulation: With patient Time For Goal Achievement: 05/18/19 Potential to Achieve Goals: Good  OT Frequency: Min 2X/week   Barriers to D/C:            Co-evaluation              AM-PAC OT "6 Clicks" Daily Activity     Outcome Measure Help from another person eating meals?: A Little Help from another person taking care of personal grooming?: A Little Help from another person toileting, which includes using toliet, bedpan, or urinal?: A Lot Help from  another person bathing (including washing, rinsing, drying)?: A Lot Help from another person to put on and taking off regular upper body clothing?: A Lot Help from another person to put on and taking off regular lower body clothing?: Total 6 Click Score: 13   End of Session Equipment Utilized During Treatment: Gait belt;Rolling walker;Oxygen Nurse Communication: Mobility status  Activity Tolerance: Patient tolerated treatment well Patient left: in bed;with call bell/phone within reach;with bed alarm set;with nursing/sitter in room  OT Visit Diagnosis: Unsteadiness on feet (R26.81);Other abnormalities of gait and mobility (R26.89);Muscle weakness (generalized) (M62.81);Other symptoms and signs involving cognitive function;Pain Pain - part of body: (buttocks)                Time: 0272-5366 OT Time Calculation (min): 23 min Charges:  OT General Charges $OT Visit: 1 Visit OT Evaluation $OT Eval Moderate Complexity: 1 Mod OT Treatments $Self Care/Home Management : 8-22 mins  Luisa Dago, OT/L   Acute OT Clinical Specialist Acute Rehabilitation Services Pager [phone] Office [phone]   [facility] 05/04/2019, 5:44 PM

## 2019-05-04 NOTE — Plan of Care (Signed)
Pt slept on and off during the night. No complaints of pain verbalized. Alert and oriented to self only, very forgetful and anxious at times. Vitals stable on 3L [place]. Minimal dyspnea on exertion and productive cough noted, prn Robitussin given. Moderate assist with ADLs. Purewick in place, draining well.  Assisted regular repositioning for pressure relief. No other issues, will monitor.   Problem: Respiratory: Goal: Will maintain a patent airway Outcome: Progressing Goal: Complications related to the disease process, condition or treatment will be avoided or minimized Outcome: Progressing   Problem: Coping: Goal: Psychosocial and spiritual needs will be supported Outcome: Progressing   Problem: Activity: Goal: Risk for activity intolerance will decrease Outcome: Progressing   Problem: Nutrition: Goal: Adequate nutrition will be maintained Outcome: Progressing   Problem: Safety: Goal: Ability to remain free from injury will improve Outcome: Progressing

## 2019-05-04 NOTE — Progress Notes (Signed)
Manufacturing engineer [facility] liaison note Continuing to follow through chart note review and telephone calls with patient's niece Otila Kluver. Will continue to follow and request updates from nursing staff. [name] BSN, RN, [place] [phone]

## 2019-05-04 NOTE — Progress Notes (Signed)
L/M to update niece on POC.

## 2019-05-04 NOTE — Evaluation (Signed)
Physical Therapy Evaluation Patient Details Name: Sheila Hayes MRN: [id number] DOB: 07-29-1940 Today's Date: 05/04/2019   History of Present Illness  Pt is a 78 y.o. female currently on hospice at ALF admitted 05/01/19 with worsening SOB and AMS; initial (+) COVID-19 on 11/4. Developed new onset afib. PMH includes COPD (on 3L O2 baseline), CHF, DM2, HTN.    Clinical Impression  Pt presents with an overall decrease in functional mobility secondary to above. Pt poor historian and pleasantly confused; per chart, from SNF with hospice services. Today, pt requiring minA for mobility; limited by generalized weakness, decreased activity tolerance and fatigue. Saturating 79-90% on 3L O2 [name] with activity; maintaining >/87% on 4L O2 [name]. Pt would benefit from continued acute PT services to maximize functional mobility and independence prior to return to SNF.     Follow Up Recommendations SNF;Supervision/Assistance - 24 hour    Equipment Recommendations  None recommended by PT    Recommendations for Other Services       Precautions / Restrictions Precautions Precautions: Fall Restrictions Weight Bearing Restrictions: No      Mobility  Bed Mobility Overal bed mobility: Needs Assistance Bed Mobility: Supine to Sit     Supine to sit: Min assist;HOB elevated     General bed mobility comments: Pt reaching for HHA from therapist, minA for trunk elevation and scooting hips to EOB  Transfers Overall transfer level: Needs assistance Equipment used: Rolling walker (2 wheeled) Transfers: Sit to/from Stand Sit to Stand: Min assist         General transfer comment: MinA for trunk elevation and to stabilize RW  Ambulation/Gait Ambulation/Gait assistance: Min assist Gait Distance (Feet): 2 Feet Assistive device: Rolling walker (2 wheeled) Gait Pattern/deviations: Step-to pattern;Trunk flexed;Leaning posteriorly Gait velocity: Decreased   General Gait Details: Steps from bed to  recliner with RW and minA, cues for safety as pt leaving RW behind and reaching to recliner prematurely; declining to stand again for repositioning or further mobility  Stairs            Wheelchair Mobility    Modified Rankin (Stroke Patients Only)       Balance Overall balance assessment: Needs assistance   Sitting balance-[name] Scale: Fair       Standing balance-[name] Scale: Poor Standing balance comment: Reliant on UE support                             Pertinent Vitals/Pain Pain Assessment: Faces Faces Pain Scale: Hurts a little bit Pain Location: Buttocks with sitting Pain Intervention(s): Repositioned(pt declining sitting on pillow to relief sacral pressure)    Home Living Family/patient expects to be discharged to:: Assisted living                 Additional Comments: Pt poor historian. Per chart, from [facility]F on hospice care    Prior Function Level of Independence: Needs assistance   Gait / Transfers Assistance Needed: Pt poor historian, reports ambulatory with RW  ADL's / Homemaking Assistance Needed: Pt reports indep with ADL tasks; likely requiring assist from staff        Hand Dominance        Extremity/Trunk Assessment   Upper Extremity Assessment Upper Extremity Assessment: Generalized weakness    Lower Extremity Assessment Lower Extremity Assessment: Generalized weakness       Communication   Communication: HOH  Cognition Arousal/Alertness: Awake/alert Behavior During Therapy: WFL for tasks assessed/performed Overall Cognitive  Status: History of cognitive impairments - at baseline Area of Impairment: Orientation;Attention;Memory;Following commands;Safety/judgement;Awareness;Problem solving                 Orientation Level: Disoriented to;Place;Time;Situation Current Attention Level: Sustained;Selective Memory: Decreased short-term memory Following Commands: Follows one step commands with increased  time;Follows multi-step commands inconsistently Safety/Judgement: Decreased awareness of safety;Decreased awareness of deficits Awareness: Intellectual Problem Solving: Slow processing;Decreased initiation;Requires verbal cues        General Comments General comments (skin integrity, edema, etc.): SpO2 79-90% on 3L O2 [name], maintaining >87% on 4L O2 [place] while pt seated/resting    Exercises     Assessment/Plan    PT Assessment Patient needs continued PT services  PT Problem List Decreased strength;Decreased activity tolerance;Decreased balance;Decreased mobility;Decreased cognition;Decreased safety awareness;Cardiopulmonary status limiting activity       PT Treatment Interventions DME instruction;Gait training;Functional mobility training;Therapeutic activities;Therapeutic exercise;Balance training;Patient/family education    PT Goals (Current goals can be found in the Care Plan section)  Acute Rehab PT Goals Patient Stated Goal: "I want to find my niece" PT Goal Formulation: With patient Time For Goal Achievement: 05/18/19 Potential to Achieve Goals: Fair    Frequency Min 2X/week   Barriers to discharge        Co-evaluation               AM-PAC PT "6 Clicks" Mobility  Outcome Measure Help needed turning from your back to your side while in a flat bed without using bedrails?: A Little Help needed moving from lying on your back to sitting on the side of a flat bed without using bedrails?: A Little Help needed moving to and from a bed to a chair (including a wheelchair)?: A Little Help needed standing up from a chair using your arms (e.g., wheelchair or bedside chair)?: A Little Help needed to walk in hospital room?: A Little Help needed climbing 3-5 steps with a railing? : A Lot 6 Click Score: 17    End of Session Equipment Utilized During Treatment: Oxygen Activity Tolerance: Patient tolerated treatment well;Patient limited by fatigue Patient left: in chair;with  call bell/phone within reach;with chair alarm set Nurse Communication: Mobility status PT Visit Diagnosis: Other abnormalities of gait and mobility (R26.89);Muscle weakness (generalized) (M62.81)    Time: 1430-1505 PT Time Calculation (min) (ACUTE ONLY): 35 min   Charges:   PT Evaluation $PT Eval Moderate Complexity: 1 Mod PT Treatments $Therapeutic Activity: 8-22 mins   Mabeline Caras, PT, DPT Acute Rehabilitation Services  Pager [phone] Office [place] 05/04/2019, 4:55 PM

## 2019-05-05 LAB — CBC WITH DIFFERENTIAL/PLATELET
Abs Immature Granulocytes: 0.07 10*3/uL (ref 0.00–0.07)
Basophils Absolute: 0 10*3/uL (ref 0.0–0.1)
Basophils Relative: 0 %
Eosinophils Absolute: 0 10*3/uL (ref 0.0–0.5)
Eosinophils Relative: 0 %
HCT: 31.5 % — ABNORMAL LOW (ref 36.0–46.0)
Hemoglobin: 8.5 g/dL — ABNORMAL LOW (ref 12.0–15.0)
Immature Granulocytes: 2 %
Lymphocytes Relative: 17 %
Lymphs Abs: 0.6 10*3/uL — ABNORMAL LOW (ref 0.7–4.0)
MCH: 23.8 pg — ABNORMAL LOW (ref 26.0–34.0)
MCHC: 27 g/dL — ABNORMAL LOW (ref 30.0–36.0)
MCV: 88.2 fL (ref 80.0–100.0)
Monocytes Absolute: 0.2 10*3/uL (ref 0.1–1.0)
Monocytes Relative: 4 %
Neutro Abs: 2.8 10*3/uL (ref 1.7–7.7)
Neutrophils Relative %: 77 %
Platelets: 279 10*3/uL (ref 150–400)
RBC: 3.57 MIL/uL — ABNORMAL LOW (ref 3.87–5.11)
RDW: 15.6 % — ABNORMAL HIGH (ref 11.5–15.5)
WBC: 3.7 10*3/uL — ABNORMAL LOW (ref 4.0–10.5)
nRBC: 0 % (ref 0.0–0.2)

## 2019-05-05 LAB — COMPREHENSIVE METABOLIC PANEL
ALT: 15 U/L (ref 0–44)
AST: 15 U/L (ref 15–41)
Albumin: 2.4 g/dL — ABNORMAL LOW (ref 3.5–5.0)
Alkaline Phosphatase: 43 U/L (ref 38–126)
Anion gap: 9 (ref 5–15)
BUN: 24 mg/dL — ABNORMAL HIGH (ref 8–23)
CO2: 46 mmol/L — ABNORMAL HIGH (ref 22–32)
Calcium: 8.9 mg/dL (ref 8.9–10.3)
Chloride: 90 mmol/L — ABNORMAL LOW (ref 98–111)
Creatinine, Ser: 0.8 mg/dL (ref 0.44–1.00)
GFR calc Af Amer: >60 mL/min (ref >60)
GFR calc non Af Amer: >60 mL/min (ref >60)
Glucose, Bld: 198 mg/dL — ABNORMAL HIGH (ref 70–99)
Potassium: 3.9 mmol/L (ref 3.5–5.1)
Sodium: 145 mmol/L (ref 135–145)
Total Bilirubin: 0.2 mg/dL — ABNORMAL LOW (ref 0.3–1.2)
Total Protein: 5.8 g/dL — ABNORMAL LOW (ref 6.5–8.1)

## 2019-05-05 LAB — GLUCOSE, CAPILLARY
Glucose-Capillary: 167 mg/dL — ABNORMAL HIGH (ref 70–99)
Glucose-Capillary: 207 mg/dL — ABNORMAL HIGH (ref 70–99)
Glucose-Capillary: 176 mg/dL — ABNORMAL HIGH (ref 70–99)
Glucose-Capillary: 275 mg/dL — ABNORMAL HIGH (ref 70–99)
Glucose-Capillary: 326 mg/dL — ABNORMAL HIGH (ref 70–99)
Glucose-Capillary: 197 mg/dL — ABNORMAL HIGH (ref 70–99)

## 2019-05-05 LAB — C-REACTIVE PROTEIN: CRP: 10.2 mg/dL — ABNORMAL HIGH (ref <1.0)

## 2019-05-05 LAB — MAGNESIUM: Magnesium: 1.8 mg/dL (ref 1.7–2.4)

## 2019-05-05 MED ORDER — APIXABAN 5 MG PO TABS
5.0000 mg | ORAL_TABLET | Freq: Two times a day (BID) | ORAL | Status: DC
  Administered 2019-05-05 – 2019-05-08 (×6): 5 mg via ORAL
  Filled 2019-05-05 (×6): qty 1

## 2019-05-05 MED ORDER — MAGNESIUM SULFATE 2 GM/50ML IV SOLN
2.0000 g | Freq: Once | INTRAVENOUS | Status: AC
  Administered 2019-05-05: 2 g via INTRAVENOUS
  Filled 2019-05-05: qty 50

## 2019-05-05 MED ORDER — POTASSIUM CHLORIDE CRYS ER 20 MEQ PO TBCR
20.0000 meq | EXTENDED_RELEASE_TABLET | Freq: Once | ORAL | Status: AC
  Administered 2019-05-05: 20 meq via ORAL
  Filled 2019-05-05: qty 1

## 2019-05-05 MED ORDER — NICOTINE 21 MG/24HR TD PT24
21.0000 mg | MEDICATED_PATCH | Freq: Every day | TRANSDERMAL | Status: DC
  Administered 2019-05-05 – 2019-05-08 (×4): 21 mg via TRANSDERMAL
  Filled 2019-05-05 (×5): qty 1

## 2019-05-05 MED ORDER — INSULIN GLARGINE 100 UNIT/ML ~~LOC~~ SOLN
8.0000 [IU] | Freq: Every day | SUBCUTANEOUS | Status: DC
  Administered 2019-05-05 – 2019-05-08 (×4): 8 [IU] via SUBCUTANEOUS
  Filled 2019-05-05 (×4): qty 0.08

## 2019-05-05 NOTE — Progress Notes (Signed)
Brief Pharmacy Note  Patient is a 77 y/o F who presented to [facility] ED 11/9 for hypoxia and AMS in presence of positive COVID test. She was subsequently transferred to [place] for further care. Urine culture from [facility] has resulted >100k colonies/mL Citrobacter braakii (cefazolin resistant but otherwise pan-sensitive). Per chart, MD at [facility] not pursuing treatment due to asymptomatic pyuria. Notified pharmacist at [facility] caring for patient who was already aware. No further action indicated.   [place] Resident 05 May 2019

## 2019-05-05 NOTE — Progress Notes (Signed)
   05/05/19 1307  Family/Significant Other Communication  Family/Significant Other Update Called;Updated (niece, Otila Kluver)

## 2019-05-05 NOTE — Progress Notes (Signed)
CSW spoke with patients niece, Otila Kluver, regarding discharge plans. Patient is a resident at [facility] spoke with Lattie Haw from facility earlier in the week and was informed patient could return once 10 days out from 1st covid test. Otila Kluver prefers patient to go to SNF at this time to regain some of her strength. Informed her of limited SNF options in the area- prefers [facility]. [facility] is able to accept patient once ready.   Kingsley Spittle, LCSW [facility]  [phone]

## 2019-05-05 NOTE — Discharge Instructions (Addendum)

## 2019-05-05 NOTE — Progress Notes (Signed)
Physical Therapy Treatment Patient Details Name: Sheila Hayes MRN: [id number] DOB: 24-Dec-1940 Today's Date: 05/05/2019    History of Present Illness Pt is a 78 y.o. female currently on hospice at ALF admitted 05/01/19 with worsening SOB and AMS; initial (+) COVID-19 on 11/4. Developed new onset afib. PMH includes COPD (on 3L O2 baseline), CHF, DM2, HTN.   PT Comments    Pt received yelling for help, wanting to go outside for a cigarette despite multiple attempts to reorient to location/situation. Pt progressing with mobility, requiring min-modA for transfers and short ambulation distance using RW; remains impulsive with movement. Pt limited by generalized weakness and decreased activity tolerance; at high risk for falls. Continue to recommend SNF-level therapies to maximize functional mobility and independence.  Post-mobility BP 125/58 SpO2 down to 76% on 3L O2 [place], poor pleth with earlobe probe 83% on 4L O2 [place], RN notified Pt mouth breathing, not inhaling through nose/pursed lip breathing despite cues   Follow Up Recommendations  SNF;Supervision/Assistance - 24 hour     Equipment Recommendations  None recommended by PT    Recommendations for Other Services       Precautions / Restrictions Precautions Precautions: Fall Restrictions Weight Bearing Restrictions: No    Mobility  Bed Mobility Overal bed mobility: Needs Assistance Bed Mobility: Supine to Sit;Sit to Supine     Supine to sit: Supervision Sit to supine: Supervision   General bed mobility comments: Pt seated in recliner for ~10 min but restless and c/o butt pain, returned to supine at end of session  Transfers Overall transfer level: Needs assistance Equipment used: Rolling walker (2 wheeled) Transfers: Sit to/from Stand Sit to Stand: Min assist;Mod assist Stand pivot transfers: Min assist       General transfer comment: Pt performed multiple sit<>stand transfers from bed, BSC and recliner with varying  min-modA for trunk elevation and balance, reliant on UE support for steadying assist. "I can't go far I'm winded"  Ambulation/Gait Ambulation/Gait assistance: Min assist;Mod assist Gait Distance (Feet): 16 Feet Assistive device: Rolling walker (2 wheeled) Gait Pattern/deviations: Step-to pattern;Trunk flexed;Leaning posteriorly     General Gait Details: Impulsive, unsteady gait with RW requiring min-modA to maintain balance; assist for navigating RW and lines   Stairs             Wheelchair Mobility    Modified Rankin (Stroke Patients Only)       Balance Overall balance assessment: Needs assistance Sitting-balance support: Feet supported Sitting balance-[name] Scale: Fair       Standing balance-[name] Scale: Poor Standing balance comment: Reliant on UE support                            Cognition Arousal/Alertness: Awake/alert Behavior During Therapy: WFL for tasks assessed/performed;Agitated Overall Cognitive Status: No family/caregiver present to determine baseline cognitive functioning Area of Impairment: Orientation;Attention;Memory;Following commands;Safety/judgement;Awareness;Problem solving                 Orientation Level: Disoriented to;Place;Time;Situation Current Attention Level: Selective Memory: Decreased recall of precautions;Decreased short-term memory Following Commands: Follows one step commands with increased time Safety/Judgement: Decreased awareness of safety;Decreased awareness of deficits Awareness: Intellectual Problem Solving: Slow processing;Decreased initiation General Comments: Pt received having yelled for help, when PT entered room, pt saying, "Oh there you are, let's go outside for a cigarette." Pt frequently redirected to fact she's at hospital and cannot have cigarette, becoming agitated that she wants to go outside "because I'm not  going to smoke in this house"      Exercises      General Comments General  comments (skin integrity, edema, etc.): SpO2 down to 76% on 3L O2 [place]; tried earlobe probe but poor pleth; finger probe reading up to 83% on 4L O2 [place] with prolonged seated rest, pt not breathing through nose despite cues; RN notified of O2 levels      Pertinent Vitals/Pain Pain Assessment: Faces Faces Pain Scale: Hurts little more Pain Location: Buttocks with sitting Pain Descriptors / Indicators: Discomfort;Grimacing Pain Intervention(s): Monitored during session;Repositioned    Home Living                      Prior Function            PT Goals (current goals can now be found in the care plan section) Acute Rehab PT Goals Patient Stated Goal: To get a cigarette PT Goal Formulation: With patient Progress towards PT goals: Progressing toward goals    Frequency    Min 2X/week      PT Plan Current plan remains appropriate    Co-evaluation              AM-PAC PT "6 Clicks" Mobility   Outcome Measure  Help needed turning from your back to your side while in a flat bed without using bedrails?: None Help needed moving from lying on your back to sitting on the side of a flat bed without using bedrails?: A Little Help needed moving to and from a bed to a chair (including a wheelchair)?: A Little Help needed standing up from a chair using your arms (e.g., wheelchair or bedside chair)?: A Lot Help needed to walk in hospital room?: A Lot Help needed climbing 3-5 steps with a railing? : A Lot 6 Click Score: 16    End of Session Equipment Utilized During Treatment: Oxygen Activity Tolerance: Patient tolerated treatment well;Patient limited by fatigue;Treatment limited secondary to agitation Patient left: in bed;with call bell/phone within reach;with bed alarm set Nurse Communication: Mobility status PT Visit Diagnosis: Other abnormalities of gait and mobility (R26.89);Muscle weakness (generalized) (M62.81)     Time: 8453-6468 PT Time Calculation (min) (ACUTE  ONLY): 33 min  Charges:  $Therapeutic Activity: 23-37 mins                    Ina Homes, PT, DPT Acute Rehabilitation Services  Pager [phone] Office [phone]  Malachy Chamber 05/05/2019, 4:42 PM

## 2019-05-05 NOTE — Progress Notes (Signed)
PROGRESS NOTE  Sheila ModenaSarah F Hayes  ZOX:[id number] DOB: August 24, 1940 DOA: 05/01/2019 PCP: [facility]   Brief Narrative: Sheila ModenaSarah F Hayes is a 78 y.o. female with a history of 3L oxygen-dependent COPD, chronic CHF, T2DMand HTN currently on hospice at ALF who was diagnosed with covid-19 on 11/4 and presented with confusion and dyspnea on 11/9, found to have worsened hypoxia, elevated inflammatory markers, and bilateral infiltrates on CXR. She was admitted, started on remdesivir and steroids. Developed new-onset atrial fibrillation easily rate-controlled with beta blocker and started on lovenox for stroke risk reduction. This was later converted to DOAC. Hypoxia has improved.   Assessment & Plan: Principal Problem:   Pneumonia due to COVID-19 virus Active Problems:   HTN (hypertension)   Diabetes (HCC)   HLD (hyperlipidemia)   Chronic diastolic CHF (congestive heart failure) (HCC)   Hypothyroidism   Acute on chronic respiratory failure with hypoxia (HCC)   Urinary tract infection without hematuria  Acute on chronic hypoxic respiratory failure due to covid-19 pneumonia: Felt to be at risk of progression to ARDS. - Remains hypoxic beyond her baseline with exertion.  - Continue remdesivir 11/10 - 11/14. ALT wnl. - Continue steroids. CRP remains grossly elevated, though improving, so continued augmented dosing x1 more day. - Holding CCP due to history of CHF and stable symptoms. - Continue airborne, contact precautions.  - Check daily labs: CMP, d-dimer, ferritin, CRP - Maintain euvolemia/net negative.  - Avoid NSAIDs - Recommend proning and aggressive use of incentive spirometry. - Goals of care were discussed. Pt is on hospice and is DNR/DNI, though this hospitalization and treatments listed above are within her desired goals of care. Prognosis is guarded.   COPD with acute exacerbation:  - Continue bronchodilators and steroids  New onset atrial fibrillation with RVR:  Ventricular response controlled with addition of AV nodal agent. Has now converted back to sinus rhythm. - Continue metoprolol, decrease to 12.5mg  po BID given some hypotension. - Continue anticoagulation with CHA2DS2-VASc score of 6. Transition lovenox > DOAC. - Monitor and replete K and Mg as needed. Give K 10mEq again and Mg 2g with plan to keep levels about 4 and 2, respectively. Will continue checking - Continue telemetry for now. Has had stable NSR w/BBB since conversion from AFib w/RVR on 11/10.  T2DM:  - Continue SSI, augment to resistant SSI and add HS coverage due to hyperglycemia worse with augmented steroids and use of ensure. If difficult to manage, may need to change to glucerna. Add 8u basal insulin.  Dyslipidemia:  - Continue crestor  Chronic HFpEF:  - Continue home medications  Hypomagnesemia:  - Supplement and recheck.  Hypothyroidism: TSH 0.366. - Continue synthroid.   Obesity: BMI 33. Noted.   Asymptomatic pyuria: Will not treat.  DVT prophylaxis: Lovenox Code Status: DNR Family Communication: None at bedside. Niece contacted yesterdcay. Will update if clinical trajectory changes. Disposition Plan: Uncertain, pending PT/OT. Will need to improve hypoxia and complete remdesivir. Earliest possible DC is 11/14.  Consultants:   None  Procedures:   None  Antimicrobials:  Remdesivir 11/10 - 11/14  Subjective: Continues to be confused without other complaint. No chest pain or other pain or dyspnea. Eating ok.   Objective: Vitals:   05/05/19 0308 05/05/19 0616 05/05/19 0747 05/05/19 1132  BP: (!) 115/58 95/68 103/86 119/63  Pulse:  67 74 77  Resp: (!) 21 (!) 23 (!) 21 20  Temp: 99.1 F (37.3 C) 98.4 F (36.9 C) 98.7 F (37.1 C) 98.6 F (  37 C)  TempSrc: Oral Oral Oral Oral  SpO2:  93% 98% 94%  Weight:      Height:        Intake/Output Summary (Last 24 hours) at 05/05/2019 1204 Last data filed at 05/04/2019 1400 Gross per 24 hour  Intake 480  ml  Output 1200 ml  Net -720 ml   Filed Weights   05/01/19 2342  Weight: 95.9 kg   Gen: 78 y.o. female in no distress Pulm: Nonlabored on 3L O2 at rest. Diminished without crackles or wheezes. CV: Regular rate and rhythm. No murmur, rub, or gallop. No JVD, no significant dependent edema. GI: Abdomen soft, non-tender, non-distended, with normoactive bowel sounds.  Ext: Warm, no deformities Skin: No rashes, lesions or ulcers on visualized skin. Neuro: Alert and conversant without dysarthria but not oriented. No new focal neurological deficits. Psych: Judgement and insight appear impaired. Mood euthymic & affect congruent. Behavior is appropriate.    Data Reviewed: I have personally reviewed following labs and imaging studies  CBC: Recent Labs  Lab 05/01/19 1131 05/02/19 0322 05/05/19 0301  WBC 6.2 6.9 3.7*  NEUTROABS 5.1  --  2.8  HGB 9.8* 8.7* 8.5*  HCT 36.5 32.9* 31.5*  MCV 85.7 90.4 88.2  PLT 208 213 279   Basic Metabolic Panel: Recent Labs  Lab 05/01/19 1131 05/02/19 0322 05/03/19 0150 05/04/19 0330 05/05/19 0301  NA 142  --  139 140 145  K 3.3*  --  3.5 3.9 3.9  CL 91*  --  88* 88* 90*  CO2 41*  --  41* 43* 46*  GLUCOSE 75  --  191* 253* 198*  BUN 19  --  26* 27* 24*  CREATININE 0.84 0.85 0.92 0.70 0.80  CALCIUM 8.8*  --  8.7* 8.7* 8.9  MG  --   --   --  1.6* 1.8   GFR: Estimated Creatinine Clearance: 70 mL/min (by C-G formula based on SCr of 0.8 mg/dL). Liver Function Tests: Recent Labs  Lab 05/01/19 1131 05/03/19 0150 05/04/19 0330 05/05/19 0301  AST 23 13* 13* 15  ALT 26 18 15 15   ALKPHOS 58 45 44 43  BILITOT 0.5 0.6 0.6 0.2*  PROT 7.1 5.8* 6.1* 5.8*  ALBUMIN 3.2* 2.6* 2.6* 2.4*   No results for input(s): LIPASE, AMYLASE in the last 168 hours. No results for input(s): AMMONIA in the last 168 hours. Coagulation Profile: No results for input(s): INR, PROTIME in the last 168 hours. Cardiac Enzymes: No results for input(s): CKTOTAL, CKMB,  CKMBINDEX, TROPONINI in the last 168 hours. BNP (last 3 results) No results for input(s): PROBNP in the last 8760 hours. HbA1C: No results for input(s): HGBA1C in the last 72 hours. CBG: Recent Labs  Lab 05/04/19 1623 05/04/19 2223 05/05/19 0109 05/05/19 0627 05/05/19 0748  GLUCAP 171* 196* 167* 207* 176*   Lipid Profile: No results for input(s): CHOL, HDL, LDLCALC, TRIG, CHOLHDL, LDLDIRECT in the last 72 hours. Thyroid Function Tests: Recent Labs    05/04/19 0330  TSH 0.366   Anemia Panel: Recent Labs    05/03/19 0150 05/04/19 0330  FERRITIN 35 40   Urine analysis:    Component Value Date/Time   COLORURINE AMBER (A) 05/01/2019 1132   APPEARANCEUR CLOUDY (A) 05/01/2019 1132   APPEARANCEUR Clear 07/24/2013 1152   LABSPEC 1.021 05/01/2019 1132   LABSPEC 1.010 07/24/2013 1152   PHURINE 5.0 05/01/2019 1132   GLUCOSEU NEGATIVE 05/01/2019 1132   GLUCOSEU Negative 07/24/2013 1152   HGBUR SMALL (  A) 05/01/2019 1132   BILIRUBINUR NEGATIVE 05/01/2019 1132   BILIRUBINUR Negative 07/24/2013 1152   [place] 05/01/2019 1132   PROTEINUR 100 (A) 05/01/2019 1132   NITRITE POSITIVE (A) 05/01/2019 1132   LEUKOCYTESUR TRACE (A) 05/01/2019 1132   LEUKOCYTESUR Negative 07/24/2013 1152   Recent Results (from the past 240 hour(s))  Blood Culture (routine x 2)     Status: None (Preliminary result)   Collection Time: 05/01/19 11:28 AM   Specimen: BLOOD  Result Value Ref Range Status   Specimen Description BLOOD BLOOD LEFT FOREARM  Final   Special Requests   Final    BOTTLES DRAWN AEROBIC AND ANAEROBIC Blood Culture adequate volume   Culture   Final    NO GROWTH 4 DAYS Performed at [facility], [address], [place], [place] [address]    Report Status PENDING  Incomplete  Urine Culture     Status: Abnormal   Collection Time: 05/01/19 11:28 AM   Specimen: Urine, Random  Result Value Ref Range Status   Specimen Description   Final    URINE, RANDOM  Performed at [facility], [address] [place], [place] [address]    Special Requests   Final    Normal Performed at [facility], [address] [place], [place] [address]    Culture >=100,000 COLONIES/mL CITROBACTER BRAAKII (A)  Final   Report Status 05/04/2019 FINAL  Final   Organism ID, Bacteria CITROBACTER BRAAKII (A)  Final      Susceptibility   Citrobacter braakii - MIC*    CEFAZOLIN >=64 RESISTANT Resistant     CEFTRIAXONE <=1 SENSITIVE Sensitive     CIPROFLOXACIN <=0.25 SENSITIVE Sensitive     GENTAMICIN <=1 SENSITIVE Sensitive     IMIPENEM <=0.25 SENSITIVE Sensitive     NITROFURANTOIN <=16 SENSITIVE Sensitive     TRIMETH/SULFA <=20 SENSITIVE Sensitive     PIP/TAZO <=4 SENSITIVE Sensitive     * >=100,000 COLONIES/mL CITROBACTER BRAAKII  Blood Culture (routine x 2)     Status: None (Preliminary result)   Collection Time: 05/01/19 11:31 AM   Specimen: BLOOD  Result Value Ref Range Status   Specimen Description BLOOD RIGHT ANTECUBITAL  Final   Special Requests   Final    BOTTLES DRAWN AEROBIC AND ANAEROBIC Blood Culture adequate volume   Culture   Final    NO GROWTH 4 DAYS Performed at [facility], [address], [place], [place] [address]    Report Status PENDING  Incomplete      Radiology Studies: No results found.  Scheduled Meds: . calcium carbonate  500 mg of elemental calcium Oral BID WC  . citalopram  40 mg Oral Daily  . dexamethasone (DECADRON) injection  6 mg Intravenous Q12H  . docusate sodium  200 mg Oral QHS  . enoxaparin (LOVENOX) injection  1 mg/kg Subcutaneous Q12H  . furosemide  20 mg Oral Daily  . gabapentin  200 mg Oral BID  . insulin aspart  0-20 Units Subcutaneous TID WC  . insulin aspart  0-5 Units Subcutaneous QHS  . Ipratropium-Albuterol  1 puff Inhalation Q6H  . levothyroxine  125 mcg Oral QAC breakfast  . metoprolol tartrate  12.5 mg Oral BID  . mometasone-formoterol  2 puff Inhalation BID  .  multivitamin with minerals  1 tablet Oral Daily  . pantoprazole  40 mg Oral Daily  . Ensure Max Protein  11 oz Oral BID  . rosuvastatin  40 mg Oral QPM  .  sodium chloride flush  3 mL Intravenous Q12H  . vitamin C  500 mg Oral Daily  . zinc sulfate  220 mg Oral Daily   Continuous Infusions: . sodium chloride    . remdesivir 100 mg in NS 250 mL 100 mg (05/05/19 0828)  . sodium chloride       LOS: 4 days   Time spent: 25 minutes.  Tyrone Nine, MD [facility] [email or web address] 05/05/2019, 12:04 PM

## 2019-05-05 NOTE — Progress Notes (Signed)
[facility] Liaison note Telephone call with patient's niece Gevena Mart regarding the recommendations for her aunt to discharge to SNF for STR. Otila Kluver would like this if it will be helpful for aunt to retrun to herbaseline. Writer explained to Otila Kluver that if her aunt were to pursue short term rehab, she would have to first revoke her hospice benefit, she voiced understanding. She also stated that she would like hospice services to resume when her aunt returns to [facility]. Revocation of hospice benefit form must be signed before patient discharges to a SNF for rehab, again Otila Kluver voiced her understanding. hospice team updated to possible d/c to SNF. Please contact [phone] if there is a plan for discharge over the weekend. Number also provided to [facility]. [name] BSN, RN, [facility] Liaison [employer]

## 2019-05-05 NOTE — Progress Notes (Addendum)
ANTICOAGULATION CONSULT NOTE - Follow Up Consult  Pharmacy Consult for Lovenox Indication: atrial fibrillation  No Known Allergies  Patient Measurements: Height: 5\' 7"  (170.2 cm) Weight: 211 lb 8 oz (95.9 kg) IBW/kg (Calculated) : 61.6  Vital Signs: Temp: 98.7 F (37.1 C) (11/13 0747) Temp Source: Oral (11/13 0747) BP: 103/86 (11/13 0747) Pulse Rate: 74 (11/13 0747)  Labs: Recent Labs    05/03/19 0150 05/04/19 0330 05/05/19 0301  HGB  --   --  8.5*  HCT  --   --  31.5*  PLT  --   --  279  CREATININE 0.92 0.70 0.80    Estimated Creatinine Clearance: 70 mL/min (by C-G formula based on SCr of 0.8 mg/dL).   Medical History: Past Medical History:  Diagnosis Date  . Chronic diastolic congestive heart failure ([place])   . COPD (chronic obstructive pulmonary disease) ([place])   . Diabetes mellitus without complication ([place])   . Hyperlipidemia   . Hypertension   . Hypothyroidism   . Morbid obesity ([place])   . Pulmonary hypertension (HCC)     Medications:  Medications Prior to Admission  Medication Sig Dispense Refill Last Dose  . acetaminophen (TYLENOL) 325 MG tablet Take 650 mg by mouth 2 (two) times daily.   05/01/2019 at Unknown time  . acetaminophen (TYLENOL) 500 MG tablet Take 500 mg by mouth every 6 (six) hours as needed for headache.   unk  . budesonide-formoterol (SYMBICORT) 80-4.5 MCG/ACT inhaler Inhale 2 puffs into the lungs 2 (two) times daily.   05/01/2019 at Unknown time  . citalopram (CELEXA) 40 MG tablet Take 40 mg by mouth daily.   05/01/2019  . docusate sodium (COLACE) 100 MG capsule Take 200 mg by mouth every other day.    04/30/2019  . furosemide (LASIX) 20 MG tablet Take 20 mg by mouth daily.   05/01/2019  . gabapentin (NEURONTIN) 100 MG capsule Take 200 mg by mouth 2 (two) times daily.   05/01/2019 at Unknown time  . glipiZIDE (GLUCOTROL) 10 MG tablet Take 10 mg by mouth daily before breakfast.   05/01/2019 at Unknown time  . levothyroxine (SYNTHROID,  LEVOTHROID) 125 MCG tablet Take 125 mcg by mouth daily before breakfast.   05/01/2019 at Unknown time  . LORazepam (ATIVAN) 0.5 MG tablet Take 0.5 mg by mouth every 4 (four) hours as needed for anxiety.   04/29/2019  . metFORMIN (GLUCOPHAGE) 500 MG tablet Take 1,000 mg by mouth 2 (two) times daily with a meal.    05/01/2019 at Unknown time  . metoprolol tartrate (LOPRESSOR) 25 MG tablet Take 0.5 tablets (12.5 mg total) by mouth 2 (two) times daily. (Patient taking differently: Take 12.5 mg by mouth 2 (two) times daily. HOLD for SBP <100 or HR <60) 30 tablet 0 05/01/2019 at 0800  . Morphine Sulfate (MORPHINE CONCENTRATE) 10 mg / 0.5 ml concentrated solution Take 10 mg by mouth every 2 (two) hours as needed for severe pain.   04/29/2019  . OXYGEN Inhale 3 L into the lungs continuous.     . potassium chloride (K-DUR) 10 MEQ tablet Take 10 mEq by mouth daily.   05/01/2019 at Unknown time  . tiotropium (SPIRIVA) 18 MCG inhalation capsule Place 18 mcg into inhaler and inhale daily.   05/01/2019 at Unknown time  . albuterol (PROVENTIL HFA;VENTOLIN HFA) 108 (90 Base) MCG/ACT inhaler Inhale 2 puffs into the lungs every 6 (six) hours as needed for wheezing or shortness of breath.     . calcium  carbonate (OSCAL) 1500 (600 Ca) MG TABS tablet Take 600 mg of elemental calcium by mouth 2 (two) times daily with a meal.    Not Taking at Unknown time  . ipratropium-albuterol (DUONEB) 0.5-2.5 (3) MG/3ML SOLN Take 3 mLs by nebulization 3 (three) times daily.   Not Taking at Unknown time  . nystatin (NYSTATIN) powder Apply topically 2 (two) times daily as needed. For fungal growth   Not Taking at Unknown time  . omeprazole (PRILOSEC) 20 MG capsule Take 20 mg by mouth daily.   Not Taking at Unknown time  . predniSONE (DELTASONE) 10 MG tablet Label  & dispense according to the schedule below. 5 Pills PO for 1 day then, 4 Pills PO for 1 day, 3 Pills PO for 1 day, 2 Pills PO for 1 day, 1 Pill PO for 1 days then STOP. (Patient not  taking: Reported on 05/01/2019) 15 tablet 0 Not Taking at Unknown time  . prochlorperazine (COMPAZINE) 10 MG tablet Take 10 mg by mouth every 6 (six) hours as needed for nausea or vomiting.   Not Taking at Unknown time  . rosuvastatin (CRESTOR) 40 MG tablet Take 40 mg by mouth every evening.    Not Taking at Unknown time    Assessment: 64 YOF with COVID-19 associated hypoxia. Now with new Afib. Pharmacy consulted to start treatment dose Lovenox. H/H low stable, Plt wnl. SCr wnl  Goal of Therapy:  Anti-Xa level 0.6-1 units/ml 4hrs after LMWH dose given Monitor platelets by anticoagulation protocol: Yes   Plan:  -Continue Lovenox 1 mg/kg (95 mg) BID.  -Monitor renal fx, CBC and s/s of bleeding  Vinnie Level, PharmD., BCPS Clinical Pharmacist Clinical phone for 05/05/19 until 5pm: [phone]   Addendum: Now transitioning patient to apixaban. D/c Lovenox and start apixaban 5 mg twice daily. First dose tonight.   Vinnie Level, PharmD., BCPS Clinical Pharmacist

## 2019-05-06 LAB — CBC
HCT: 31.8 % — ABNORMAL LOW (ref 36.0–46.0)
Hemoglobin: 8.5 g/dL — ABNORMAL LOW (ref 12.0–15.0)
MCH: 23.2 pg — ABNORMAL LOW (ref 26.0–34.0)
MCHC: 26.7 g/dL — ABNORMAL LOW (ref 30.0–36.0)
MCV: 86.6 fL (ref 80.0–100.0)
Platelets: 319 10*3/uL (ref 150–400)
RBC: 3.67 MIL/uL — ABNORMAL LOW (ref 3.87–5.11)
RDW: 15.9 % — ABNORMAL HIGH (ref 11.5–15.5)
WBC: 4.1 10*3/uL (ref 4.0–10.5)
nRBC: 0.5 % — ABNORMAL HIGH (ref 0.0–0.2)

## 2019-05-06 LAB — GLUCOSE, CAPILLARY
Glucose-Capillary: 217 mg/dL — ABNORMAL HIGH (ref 70–99)
Glucose-Capillary: 266 mg/dL — ABNORMAL HIGH (ref 70–99)
Glucose-Capillary: 307 mg/dL — ABNORMAL HIGH (ref 70–99)
Glucose-Capillary: 234 mg/dL — ABNORMAL HIGH (ref 70–99)

## 2019-05-06 LAB — CULTURE, BLOOD (ROUTINE X 2)
Culture: NO GROWTH
Culture: NO GROWTH
Special Requests: ADEQUATE
Special Requests: ADEQUATE

## 2019-05-06 LAB — COMPREHENSIVE METABOLIC PANEL
ALT: 16 U/L (ref 0–44)
AST: 18 U/L (ref 15–41)
Albumin: 2.5 g/dL — ABNORMAL LOW (ref 3.5–5.0)
Alkaline Phosphatase: 46 U/L (ref 38–126)
Anion gap: 9 (ref 5–15)
BUN: 28 mg/dL — ABNORMAL HIGH (ref 8–23)
CO2: 46 mmol/L — ABNORMAL HIGH (ref 22–32)
Calcium: 9 mg/dL (ref 8.9–10.3)
Chloride: 88 mmol/L — ABNORMAL LOW (ref 98–111)
Creatinine, Ser: 0.75 mg/dL (ref 0.44–1.00)
GFR calc Af Amer: >60 mL/min (ref >60)
GFR calc non Af Amer: >60 mL/min (ref >60)
Glucose, Bld: 255 mg/dL — ABNORMAL HIGH (ref 70–99)
Potassium: 3.9 mmol/L (ref 3.5–5.1)
Sodium: 143 mmol/L (ref 135–145)
Total Bilirubin: 0.4 mg/dL (ref 0.3–1.2)
Total Protein: 5.7 g/dL — ABNORMAL LOW (ref 6.5–8.1)

## 2019-05-06 LAB — C-REACTIVE PROTEIN: CRP: 6.1 mg/dL — ABNORMAL HIGH (ref <1.0)

## 2019-05-06 LAB — MAGNESIUM: Magnesium: 2.1 mg/dL (ref 1.7–2.4)

## 2019-05-06 NOTE — Plan of Care (Signed)
Remains on 3LNC baseline. Plan to DC to SNF tomorrow.   Problem: Education: Goal: Knowledge of risk factors and measures for prevention of condition will improve Outcome: Progressing   Problem: Coping: Goal: Psychosocial and spiritual needs will be supported Outcome: Progressing   Problem: Respiratory: Goal: Will maintain a patent airway Outcome: Progressing Goal: Complications related to the disease process, condition or treatment will be avoided or minimized Outcome: Progressing   Problem: Education: Goal: Knowledge of General Education information will improve Description: Including pain rating scale, medication(s)/side effects and non-pharmacologic comfort measures Outcome: Progressing   Problem: Health Behavior/Discharge Planning: Goal: Ability to manage health-related needs will improve Outcome: Progressing   Problem: Clinical Measurements: Goal: Ability to maintain clinical measurements within normal limits will improve Outcome: Progressing Goal: Will remain free from infection Outcome: Progressing Goal: Diagnostic test results will improve Outcome: Progressing Goal: Respiratory complications will improve Outcome: Progressing Goal: Cardiovascular complication will be avoided Outcome: Progressing   Problem: Activity: Goal: Risk for activity intolerance will decrease Outcome: Progressing   Problem: Nutrition: Goal: Adequate nutrition will be maintained Outcome: Progressing   Problem: Coping: Goal: Level of anxiety will decrease Outcome: Progressing   Problem: Elimination: Goal: Will not experience complications related to bowel motility Outcome: Progressing Goal: Will not experience complications related to urinary retention Outcome: Progressing   Problem: Pain Managment: Goal: General experience of comfort will improve Outcome: Progressing   Problem: Safety: Goal: Ability to remain free from injury will improve Outcome: Progressing   Problem: Skin  Integrity: Goal: Risk for impaired skin integrity will decrease Outcome: Progressing

## 2019-05-06 NOTE — Progress Notes (Signed)
   05/06/19 1400  Family/Significant Other Communication  Family/Significant Other Update Called;Updated (niece Otila Kluver)

## 2019-05-06 NOTE — Progress Notes (Signed)
PROGRESS NOTE  Sheila Hayes  VVO:[id number] DOB: 01-30-1941 DOA: 05/01/2019 PCP: Almetta Lovely, [facility]   Brief Narrative: Sheila Hayes is a 78 y.o. female with a history of 3L oxygen-dependent COPD, chronic CHF, T2DMand HTN currently on hospice at ALF who was diagnosed with covid-19 on 11/4 and presented with confusion and dyspnea on 11/9, found to have worsened hypoxia, elevated inflammatory markers, and bilateral infiltrates on CXR. She was admitted, started on remdesivir and steroids. Developed new-onset atrial fibrillation easily rate-controlled with beta blocker and started on lovenox for stroke risk reduction. This was later converted to DOAC. Hypoxia has improved.   Assessment & Plan: Principal Problem:   Pneumonia due to COVID-19 virus Active Problems:   HTN (hypertension)   Diabetes (HCC)   HLD (hyperlipidemia)   Chronic diastolic CHF (congestive heart failure) (HCC)   Hypothyroidism   Acute on chronic respiratory failure with hypoxia (HCC)   Urinary tract infection without hematuria  Acute on chronic hypoxic respiratory failure due to covid-19 pneumonia: Felt to be at risk of progression to ARDS. - Remains hypoxic beyond her baseline with exertion, though improving. Note bicarbonate of 46 appears to be stable.  - Continue remdesivir 11/10 - 11/14. ALT has remained wnl. - Continue steroids. CRP remains grossly elevated, though improving - Holding CCP due to history of CHF and stable symptoms. - Continue airborne, contact precautions.  - Maintain euvolemia/net negative.  - Avoid NSAIDs - Recommend proning and aggressive use of incentive spirometry. - Goals of care were discussed. Pt is on hospice and is DNR/DNI, though this hospitalization and treatments listed above are within her desired goals of care. Prognosis is guarded. Hospice will be revoked with hope that baseline functional status can be attained after rehabilitation following hospitalization.   COPD  with acute exacerbation:  - Continue bronchodilators and steroids  New onset atrial fibrillation with RVR: Ventricular response controlled with addition of AV nodal agent. Has now converted back to sinus rhythm. - Continue metoprolol, decrease to 12.5mg  po BID given some hypotension. - Continue anticoagulation with CHA2DS2-VASc score of 6. Transition lovenox > DOAC. - Monitor and replete K and Mg as needed. Give K again and Mg 2g with plan to keep levels about 4 and 2, respectively. Will continue checking - Continue telemetry for now. Has had stable NSR w/BBB since conversion from AFib w/RVR on 11/10.  T2DM:  - Continue SSI, augment to resistant SSI and add HS coverage due to hyperglycemia worse with augmented steroids and use of ensure. If difficult to manage, may need to change to glucerna. Add 8u basal insulin.  Dyslipidemia:  - Continue crestor  Chronic HFpEF:  - Continue home medications  Hypomagnesemia:  - Supplement and recheck.  Hypothyroidism: TSH 0.366. - Continue synthroid.   Obesity: BMI 33. Noted.   Asymptomatic pyuria: Will not treat.  Tobacco use: Pt is on hospice.  - Nicotine patch while admitted.   DVT prophylaxis: Lovenox Code Status: DNR Family Communication: None at bedside. Niece contacted previously, plan to remain the same. Will update if clinical trajectory changes. Disposition Plan: Uncertain, pending PT/OT. Will need to improve hypoxia and complete remdesivir. If still on home 3L 11/15, will DC to SNF. This will require revocation of hospice benefit.   Consultants:   None  Procedures:   None  Antimicrobials:  Remdesivir 11/10 - 11/14  Subjective: Concerned about wanting to go outside to smoke. Denies shortness of breath or any other concerns.    Objective: Vitals:  05/05/19 2011 05/06/19 0025 05/06/19 0421 05/06/19 0716  BP:  122/74 124/64 (!) 120/55  Pulse:  78  68  Resp:  (!) 22  (!) 22  Temp: 98.9 F (37.2 C) 98.9 F (37.2  C) 98.5 F (36.9 C) 98.8 F (37.1 C)  TempSrc: Axillary Oral Oral Oral  SpO2:  95%  94%  Weight:      Height:        Intake/Output Summary (Last 24 hours) at 05/06/2019 1139 Last data filed at 05/06/2019 0814 Gross per 24 hour  Intake 493 ml  Output -  Net 493 ml   Filed Weights   05/01/19 2342  Weight: 95.9 kg   Gen: 78 y.o. female in no distress Pulm: Nonlabored breathing 3L O2 at rest. Clear, diminished. CV: Regular rate and rhythm. No murmur, rub, or gallop. No JVD, no dependent edema. GI: Abdomen soft, non-tender, non-distended, with normoactive bowel sounds.  Ext: Warm, no deformities Skin: No new rashes, lesions or ulcers on visualized skin. Neuro: Alert, conversant but disoriented. No new focal neurological deficits. Psych: Judgement and insight appear impaired. Mood euthymic & affect congruent. Behavior is appropriate.    Data Reviewed: I have personally reviewed following labs and imaging studies  CBC: Recent Labs  Lab 05/01/19 1131 05/02/19 0322 05/05/19 0301 05/06/19 0115  WBC 6.2 6.9 3.7* 4.1  NEUTROABS 5.1  --  2.8  --   HGB 9.8* 8.7* 8.5* 8.5*  HCT 36.5 32.9* 31.5* 31.8*  MCV 85.7 90.4 88.2 86.6  PLT 208 213 279 319   Basic Metabolic Panel: Recent Labs  Lab 05/01/19 1131 05/02/19 0322 05/03/19 0150 05/04/19 0330 05/05/19 0301 05/06/19 0115  NA 142  --  139 140 145 143  K 3.3*  --  3.5 3.9 3.9 3.9  CL 91*  --  88* 88* 90* 88*  CO2 41*  --  41* 43* 46* 46*  GLUCOSE 75  --  191* 253* 198* 255*  BUN 19  --  26* 27* 24* 28*  CREATININE 0.84 0.85 0.92 0.70 0.80 0.75  CALCIUM 8.8*  --  8.7* 8.7* 8.9 9.0  MG  --   --   --  1.6* 1.8 2.1   GFR: Estimated Creatinine Clearance: 70 mL/min (by C-G formula based on SCr of 0.75 mg/dL). Liver Function Tests: Recent Labs  Lab 05/01/19 1131 05/03/19 0150 05/04/19 0330 05/05/19 0301 05/06/19 0115  AST 23 13* 13* 15 18  ALT 26 18 15 15 16   ALKPHOS 58 45 44 43 46  BILITOT 0.5 0.6 0.6 0.2* 0.4   PROT 7.1 5.8* 6.1* 5.8* 5.7*  ALBUMIN 3.2* 2.6* 2.6* 2.4* 2.5*   No results for input(s): LIPASE, AMYLASE in the last 168 hours. No results for input(s): AMMONIA in the last 168 hours. Coagulation Profile: No results for input(s): INR, PROTIME in the last 168 hours. Cardiac Enzymes: No results for input(s): CKTOTAL, CKMB, CKMBINDEX, TROPONINI in the last 168 hours. BNP (last 3 results) No results for input(s): PROBNP in the last 8760 hours. HbA1C: No results for input(s): HGBA1C in the last 72 hours. CBG: Recent Labs  Lab 05/05/19 0748 05/05/19 1228 05/05/19 1635 05/05/19 2125 05/06/19 0729  GLUCAP 176* 275* 326* 197* 217*   Lipid Profile: No results for input(s): CHOL, HDL, LDLCALC, TRIG, CHOLHDL, LDLDIRECT in the last 72 hours. Thyroid Function Tests: Recent Labs    05/04/19 0330  TSH 0.366   Anemia Panel: Recent Labs    05/04/19 0330  FERRITIN 40  Urine analysis:    Component Value Date/Time   COLORURINE AMBER (A) 05/01/2019 1132   APPEARANCEUR CLOUDY (A) 05/01/2019 1132   APPEARANCEUR Clear 07/24/2013 1152   LABSPEC 1.021 05/01/2019 1132   LABSPEC 1.010 07/24/2013 1152   PHURINE 5.0 05/01/2019 1132   GLUCOSEU NEGATIVE 05/01/2019 1132   GLUCOSEU Negative 07/24/2013 1152   HGBUR SMALL (A) 05/01/2019 1132   BILIRUBINUR NEGATIVE 05/01/2019 1132   BILIRUBINUR Negative 07/24/2013 1152   KETONESUR NEGATIVE 05/01/2019 1132   PROTEINUR 100 (A) 05/01/2019 1132   NITRITE POSITIVE (A) 05/01/2019 1132   LEUKOCYTESUR TRACE (A) 05/01/2019 1132   LEUKOCYTESUR Negative 07/24/2013 1152   Recent Results (from the past 240 hour(s))  Blood Culture (routine x 2)     Status: None   Collection Time: 05/01/19 11:28 AM   Specimen: BLOOD  Result Value Ref Range Status   Specimen Description BLOOD BLOOD LEFT FOREARM  Final   Special Requests   Final    BOTTLES DRAWN AEROBIC AND ANAEROBIC Blood Culture adequate volume   Culture   Final    NO GROWTH 5 DAYS Performed at  [facility], [address] [place], [place] [address]    Report Status 05/06/2019 FINAL  Final  Urine Culture     Status: Abnormal   Collection Time: 05/01/19 11:28 AM   Specimen: Urine, Random  Result Value Ref Range Status   Specimen Description   Final    URINE, RANDOM Performed at [facility], [address] [place], [place] [address]    Special Requests   Final    Normal Performed at [facility], [address] [place], [place] [address]    Culture >=100,000 COLONIES/mL CITROBACTER BRAAKII (A)  Final   Report Status 05/04/2019 FINAL  Final   Organism ID, Bacteria CITROBACTER BRAAKII (A)  Final      Susceptibility   Citrobacter braakii - MIC*    CEFAZOLIN >=64 RESISTANT Resistant     CEFTRIAXONE <=1 SENSITIVE Sensitive     CIPROFLOXACIN <=0.25 SENSITIVE Sensitive     GENTAMICIN <=1 SENSITIVE Sensitive     IMIPENEM <=0.25 SENSITIVE Sensitive     NITROFURANTOIN <=16 SENSITIVE Sensitive     TRIMETH/SULFA <=20 SENSITIVE Sensitive     PIP/TAZO <=4 SENSITIVE Sensitive     * >=100,000 COLONIES/mL CITROBACTER BRAAKII  Blood Culture (routine x 2)     Status: None   Collection Time: 05/01/19 11:31 AM   Specimen: BLOOD  Result Value Ref Range Status   Specimen Description BLOOD RIGHT ANTECUBITAL  Final   Special Requests   Final    BOTTLES DRAWN AEROBIC AND ANAEROBIC Blood Culture adequate volume   Culture   Final    NO GROWTH 5 DAYS Performed at [facility], [address], [place], [place] [address]    Report Status 05/06/2019 FINAL  Final      Radiology Studies: No results found.  Scheduled Meds: . apixaban  5 mg Oral BID  . calcium carbonate  500 mg of elemental calcium Oral BID WC  . citalopram  40 mg Oral Daily  . dexamethasone (DECADRON) injection  6 mg Intravenous Q12H  . docusate sodium  200 mg Oral QHS  . furosemide  20 mg Oral Daily  . gabapentin  200 mg Oral BID  . insulin aspart  0-20 Units Subcutaneous TID WC   . insulin aspart  0-5 Units Subcutaneous QHS  . insulin glargine  8 Units Subcutaneous Daily  . Ipratropium-Albuterol  1 puff Inhalation Q6H  . levothyroxine  125 mcg Oral QAC breakfast  . metoprolol tartrate  12.5 mg Oral BID  . mometasone-formoterol  2 puff Inhalation BID  . multivitamin with minerals  1 tablet Oral Daily  . nicotine  21 mg Transdermal Daily  . pantoprazole  40 mg Oral Daily  . Ensure Max Protein  11 oz Oral BID  . rosuvastatin  40 mg Oral QPM  . sodium chloride flush  3 mL Intravenous Q12H  . vitamin C  500 mg Oral Daily  . zinc sulfate  220 mg Oral Daily   Continuous Infusions: . sodium chloride    . sodium chloride       LOS: 5 days   Time spent: 25 minutes.  Patrecia Pour, MD [facility] [email or web address] 05/06/2019, 11:39 AM

## 2019-05-06 NOTE — [place] FL2 (Signed)
[place] MEDICAID FL2 LEVEL OF CARE SCREENING TOOL     IDENTIFICATION  Patient Name: Sheila Hayes Birthdate: March 06, 1941 Sex: female Admission Date (Current Location): 05/01/2019  [facility] and [place] Number:  Producer, television/film/video and Address:  [facility] [place], [address] [place], [place] [address]      Provider Number: [id number]  Attending Physician Name and Address:  Tyrone Nine, MD  Relative Name and Phone Number:       Current Level of Care: Hospital Recommended Level of Care: Skilled Nursing Facility Prior Approval Number:    Date Approved/Denied:   PASRR Number: [id number]  Discharge Plan: SNF    Current Diagnoses: Patient Active Problem List   Diagnosis Date Noted  . Pneumonia due to COVID-19 virus 05/02/2019  . MRSA bacteremia 09/02/2018  . Acute respiratory failure with hypercapnia (HCC) 06/14/2018  . Acute on chronic diastolic CHF (congestive heart failure) (HCC) 02/23/2018  . Altered mental status   . Urinary tract infection without hematuria   . Acute on chronic respiratory failure with hypoxia (HCC) 01/30/2018  . Near syncope 10/09/2017  . HTN (hypertension) 10/09/2017  . Diabetes (HCC) 10/09/2017  . HLD (hyperlipidemia) 10/09/2017  . Chronic diastolic CHF (congestive heart failure) (HCC) 10/09/2017  . Hypothyroidism 10/09/2017  . COPD exacerbation (HCC) 05/13/2016    Orientation RESPIRATION BLADDER Height & Weight     Self  O2(nasal cannula 3L) Incontinent Weight: 211 lb 8 oz (95.9 kg) Height:  5\' 7"  (170.2 cm)  BEHAVIORAL SYMPTOMS/MOOD NEUROLOGICAL BOWEL NUTRITION STATUS      Continent Diet(DYS 3 diet)  AMBULATORY STATUS COMMUNICATION OF NEEDS Skin   Limited Assist Verbally Skin abrasions(open wound, left Vertebral column, foam dressing, change PRN. Laceration on head.)                       Personal Care Assistance Level of Assistance  Bathing, Feeding, Dressing Bathing Assistance: Limited  assistance Feeding assistance: Independent Dressing Assistance: Limited assistance     Functional Limitations Info  Sight, Hearing, Speech Sight Info: Adequate Hearing Info: Adequate Speech Info: Adequate    SPECIAL CARE FACTORS FREQUENCY  PT (By licensed PT), OT (By licensed OT)     PT Frequency: 5x OT Frequency: 5x            Contractures Contractures Info: Not present    Additional Factors Info  Code Status, Allergies Code Status Info: DNR Allergies Info: no know allergies           Current Medications (05/06/2019):  This is the current hospital active medication list Current Facility-Administered Medications  Medication Dose Route Frequency Provider Last Rate Last Dose  . 0.9 %  sodium chloride infusion  250 mL Intravenous PRN 05/08/2019, MD      . acetaminophen (TYLENOL) tablet 650 mg  650 mg Oral Q6H PRN Haydee Monica A, MD      . albuterol (VENTOLIN HFA) 108 (90 Base) MCG/ACT inhaler 2 puff  2 puff Inhalation Q4H PRN Arrien, Tarry Kos, MD      . apixaban York Ram) tablet 5 mg  5 mg Oral BID Everlene Balls, RPH   5 mg at 05/06/19 0809  . calcium carbonate (OS-CAL - dosed in mg of elemental calcium) tablet 500 mg of elemental calcium  500 mg of elemental calcium Oral BID WC Arrien, 05/08/19, MD   500 mg of elemental calcium at 05/06/19 0810  . citalopram (CELEXA) tablet 40 mg  40 mg Oral Daily Coralie KeensArrien, Mauricio Daniel, MD   40 mg at 05/06/19 [id number]  . dexamethasone (DECADRON) injection 6 mg  6 mg Intravenous Q12H Tyrone NineGrunz, Ryan B, MD   6 mg at 05/06/19 0810  . docusate sodium (COLACE) capsule 200 mg  200 mg Oral QHS Arrien, York RamMauricio Daniel, MD   200 mg at 05/05/19 2134  . furosemide (LASIX) tablet 20 mg  20 mg Oral Daily Arrien, York RamMauricio Daniel, MD   20 mg at 05/06/19 0809  . gabapentin (NEURONTIN) capsule 200 mg  200 mg Oral BID Coralie KeensArrien, Mauricio Daniel, MD   200 mg at 05/06/19 [phone]  . guaiFENesin-dextromethorphan (ROBITUSSIN DM) 100-10 MG/5ML  syrup 10 mL  10 mL Oral Q4H PRN Haydee Monicaavid, Rachal A, MD   10 mL at 05/03/19 2122  . insulin aspart (novoLOG) injection 0-20 Units  0-20 Units Subcutaneous TID WC Tyrone NineGrunz, Ryan B, MD   7 Units at 05/06/19 [phone]  . insulin aspart (novoLOG) injection 0-5 Units  0-5 Units Subcutaneous QHS Hazeline JunkerGrunz, Ryan B, MD      . insulin glargine (LANTUS) injection 8 Units  8 Units Subcutaneous Daily Tyrone NineGrunz, Ryan B, MD   8 Units at 05/06/19 0813  . Ipratropium-Albuterol (COMBIVENT) respimat 1 puff  1 puff Inhalation Q6H Arrien, York RamMauricio Daniel, MD   1 puff at 05/06/19 [phone]  . levothyroxine (SYNTHROID) tablet 125 mcg  125 mcg Oral QAC breakfast Coralie KeensArrien, Mauricio Daniel, MD   125 mcg at 05/06/19 0537  . LORazepam (ATIVAN) tablet 0.5 mg  0.5 mg Oral Q4H PRN Arrien, York RamMauricio Daniel, MD   0.5 mg at 05/03/19 2123  . metoprolol tartrate (LOPRESSOR) injection 5 mg  5 mg Intravenous Q4H PRN Arrien, York RamMauricio Daniel, MD   5 mg at 05/04/19 2227  . metoprolol tartrate (LOPRESSOR) tablet 12.5 mg  12.5 mg Oral BID Tyrone NineGrunz, Ryan B, MD   12.5 mg at 05/06/19 [id number]  . mometasone-formoterol (DULERA) 100-5 MCG/ACT inhaler 2 puff  2 puff Inhalation BID Arrien, York RamMauricio Daniel, MD   2 puff at 05/06/19 [phone]  . multivitamin with minerals tablet 1 tablet  1 tablet Oral Daily Tyrone NineGrunz, Ryan B, MD   1 tablet at 05/06/19 0807  . nicotine (NICODERM CQ - dosed in mg/24 hours) patch 21 mg  21 mg Transdermal Daily Tyrone NineGrunz, Ryan B, MD   21 mg at 05/06/19 0818  . ondansetron (ZOFRAN) tablet 4 mg  4 mg Oral Q6H PRN Haydee Monicaavid, Rachal A, MD       Or  . ondansetron (ZOFRAN) injection 4 mg  4 mg Intravenous Q6H PRN Haydee Monicaavid, Rachal A, MD      . pantoprazole (PROTONIX) EC tablet 40 mg  40 mg Oral Daily Arrien, York RamMauricio Daniel, MD   40 mg at 05/06/19 0809  . prochlorperazine (COMPAZINE) tablet 10 mg  10 mg Oral Q6H PRN Arrien, York RamMauricio Daniel, MD      . protein supplement (ENSURE MAX) liquid  11 oz Oral BID Tyrone NineGrunz, Ryan B, MD   11 oz at 05/05/19 2134  . rosuvastatin (CRESTOR) tablet  40 mg  40 mg Oral QPM Arrien, York RamMauricio Daniel, MD   40 mg at 05/05/19 1706  . sodium chloride 0.9 % bolus 500 mL  500 mL Intravenous Once Hazeline JunkerGrunz, Ryan B, MD      . sodium chloride flush (NS) 0.9 % injection 3 mL  3 mL Intravenous Q12H Tarry Kosavid, Rachal A, MD   3 mL at 05/06/19 0814  . sodium chloride flush (NS) 0.9 % injection 3  mL  3 mL Intravenous PRN Derrill Kay A, MD      . vitamin C (ASCORBIC ACID) tablet 500 mg  500 mg Oral Daily Derrill Kay A, MD   500 mg at 05/06/19 0809  . zinc sulfate capsule 220 mg  220 mg Oral Daily Phillips Grout, MD   220 mg at 05/06/19 0092     Discharge Medications: Please see discharge summary for a list of discharge medications.  Relevant Imaging Results:  Relevant Lab Results:   Additional Information [id number]  Gerrianne Scale Toney Difatta, LCSW

## 2019-05-07 DIAGNOSIS — N390 Urinary tract infection, site not specified: Secondary | ICD-10-CM

## 2019-05-07 DIAGNOSIS — E785 Hyperlipidemia, unspecified: Secondary | ICD-10-CM

## 2019-05-07 DIAGNOSIS — E039 Hypothyroidism, unspecified: Secondary | ICD-10-CM

## 2019-05-07 DIAGNOSIS — J9621 Acute and chronic respiratory failure with hypoxia: Secondary | ICD-10-CM

## 2019-05-07 LAB — GLUCOSE, CAPILLARY
Glucose-Capillary: 230 mg/dL — ABNORMAL HIGH (ref 70–99)
Glucose-Capillary: 257 mg/dL — ABNORMAL HIGH (ref 70–99)
Glucose-Capillary: 223 mg/dL — ABNORMAL HIGH (ref 70–99)

## 2019-05-07 LAB — C-REACTIVE PROTEIN: CRP: 3.3 mg/dL — ABNORMAL HIGH (ref <1.0)

## 2019-05-07 MED ORDER — PNEUMOCOCCAL VAC POLYVALENT 25 MCG/0.5ML IJ INJ
0.5000 mL | INJECTION | INTRAMUSCULAR | Status: AC
  Administered 2019-05-08: 0.5 mL via INTRAMUSCULAR
  Filled 2019-05-07: qty 0.5

## 2019-05-07 MED ORDER — DEXAMETHASONE 6 MG PO TABS: 6.0000 mg | ORAL_TABLET | Freq: Every day | ORAL | 0 refills | Status: DC

## 2019-05-07 MED ORDER — NICOTINE 21 MG/24HR TD PT24: 21.0000 mg | MEDICATED_PATCH | Freq: Every day | TRANSDERMAL | Status: DC

## 2019-05-07 MED ORDER — INFLUENZA VAC A&B SA ADJ QUAD 0.5 ML IM PRSY
0.5000 mL | PREFILLED_SYRINGE | INTRAMUSCULAR | Status: AC
  Administered 2019-05-08: 0.5 mL via INTRAMUSCULAR
  Filled 2019-05-07: qty 0.5

## 2019-05-07 MED ORDER — APIXABAN 5 MG PO TABS: 5.0000 mg | ORAL_TABLET | Freq: Two times a day (BID) | ORAL | 0 refills | Status: AC

## 2019-05-07 MED ORDER — LORAZEPAM 0.5 MG PO TABS: 0.5000 mg | ORAL_TABLET | Freq: Three times a day (TID) | ORAL | 0 refills | Status: DC | PRN

## 2019-05-07 NOTE — Discharge Summary (Signed)
Physician Discharge Summary  [name] STM:[id number] DOB: Nov 05, 1940 DOA: 05/01/2019  PCP: Orvis Br[facility], [facility]  Admit date: 05/01/2019 Discharge date: 05/07/2019  Admitted From: NH Disposition: SNF   Recommendations for Outpatient Follow-up:  1. Follow up with PCP in 1-2 weeks 2. Please obtain BMP/CBC in one week 3. Started eliquis for new onset AFib w/very high stroke risk. Monitor for bleeding. 4. Monitor CBGs, consider sliding scale insulin while completing steroids.  Home Health: N/A Equipment/Devices: Per SNF, continue supplemental oxygen. 3L at rest, up to 4L w/exertion. Discharge Condition: Stable CODE STATUS: DNR Diet recommendation: Heart healthy, carb-modified.  Brief/Interim Summary: Sheila Hayes is a 78 y.o. female with a history of 3L oxygen-dependent COPD, chronic CHF, T2DMand HTN currently on hospice at ALF who was diagnosed with covid-19 on 11/4 and presented with confusion and dyspnea on 11/9, found to have worsened hypoxia, elevated inflammatory markers, and bilateral infiltrates on CXR. She was admitted, started on remdesivir and steroids. Developed new-onset atrial fibrillation easily rate-controlled with beta blocker and started on lovenox for stroke risk reduction. This was later converted to [place]. Hypoxia has improved back to baseline. PT and OT have recommended SNF rehabilitation in an effort to return to PLOF which has been arranged. See below for further details.  Discharge Diagnoses:  Principal Problem:   Pneumonia due to COVID-19 virus Active Problems:   HTN (hypertension)   Diabetes ([place])   HLD (hyperlipidemia)   Chronic diastolic CHF (congestive heart failure) (HCC)   Hypothyroidism   Acute on chronic respiratory failure with hypoxia (HCC)   Urinary tract infection without hematuria  Acute on chronic hypoxic respiratory failure due to covid-19 pneumonia:  - Remains hypoxic but back to her baseline. Anticipate protracted improvement  in exertional hypoxia and no longer requires inpatient monitoring or management. Note bicarbonate of 46 appears to be stable.  - Completed remdesivir 11/10 - 11/14. ALT has remained wnl. - Continue steroids x10 days. CRP continues improvement.  - Holding CCP due to history of CHF and stable symptoms. - Continue airborne, contact precautions.  - Maintain euvolemia/net negative.  - Avoid NSAIDs - Recommend proning and aggressive use of incentive spirometry. - Goals of care were discussed. Pt is on hospice and is DNR/DNI, though this hospitalization and treatments listed above are within her desired goals of care. Prognosis is guarded. Hospice will be revoked with hope that baseline functional status can be attained after rehabilitation following hospitalization.   COPD with acute exacerbation:  - Continue bronchodilators and steroids  New onset atrial fibrillation with RVR: Ventricular response controlled with addition of AV nodal agent. Has had stable NSR w/BBB since conversion from AFib w/RVR on 11/10. - Continue metoprolol - Continue anticoagulation with CHA2DS2-VASc scoreof 6 with eliquis. - Monitor and replete K and Mg as needed   T2DM:  - Continue metformin and glucotrol. Recommend SSI while still on steroids as well.  Dyslipidemia:  - Continue crestor  Chronic HFpEF:  - Continue home medications  Hypomagnesemia:  - Supplement and recheck.  Hypothyroidism: TSH 0.366. - Continue synthroid.   Obesity: BMI 33. Noted.   Asymptomatic pyuria: Will not treat.  Tobacco use: Pt is on hospice.  - Nicotine patch can be used.  Discharge Instructions  Allergies as of 05/07/2019   No Known Allergies     Medication List    STOP taking these medications   morphine CONCENTRATE 10 mg / 0.5 ml concentrated solution   predniSONE 10 MG tablet Commonly known as: DELTASONE  TAKE these medications   acetaminophen 500 MG tablet Commonly known as: TYLENOL Take 500 mg  by mouth every 6 (six) hours as needed for headache. What changed: Another medication with the same name was removed. Continue taking this medication, and follow the directions you see here.   albuterol 108 (90 Base) MCG/ACT inhaler Commonly known as: VENTOLIN HFA Inhale 2 puffs into the lungs every 6 (six) hours as needed for wheezing or shortness of breath.   apixaban 5 MG Tabs tablet Commonly known as: ELIQUIS Take 1 tablet (5 mg total) by mouth 2 (two) times daily.   budesonide-formoterol 80-4.5 MCG/ACT inhaler Commonly known as: SYMBICORT Inhale 2 puffs into the lungs 2 (two) times daily.   calcium carbonate 1500 (600 Ca) MG Tabs tablet Commonly known as: OSCAL Take 600 mg of elemental calcium by mouth 2 (two) times daily with a meal.   citalopram 40 MG tablet Commonly known as: CELEXA Take 40 mg by mouth daily.   dexamethasone 6 MG tablet Commonly known as: Decadron Take 1 tablet (6 mg total) by mouth daily for 3 days. Start taking on: May 08, 2019   docusate sodium 100 MG capsule Commonly known as: COLACE Take 200 mg by mouth every other day.   furosemide 20 MG tablet Commonly known as: LASIX Take 20 mg by mouth daily.   gabapentin 100 MG capsule Commonly known as: NEURONTIN Take 200 mg by mouth 2 (two) times daily.   glipiZIDE 10 MG tablet Commonly known as: GLUCOTROL Take 10 mg by mouth daily before breakfast.   ipratropium-albuterol 0.5-2.5 (3) MG/3ML Soln Commonly known as: DUONEB Take 3 mLs by nebulization 3 (three) times daily.   levothyroxine 125 MCG tablet Commonly known as: SYNTHROID Take 125 mcg by mouth daily before breakfast.   LORazepam 0.5 MG tablet Commonly known as: ATIVAN Take 1 tablet (0.5 mg total) by mouth every 8 (eight) hours as needed for anxiety. What changed: when to take this   metFORMIN 500 MG tablet Commonly known as: GLUCOPHAGE Take 1,000 mg by mouth 2 (two) times daily with a meal.   metoprolol tartrate 25 MG  tablet Commonly known as: LOPRESSOR Take 0.5 tablets (12.5 mg total) by mouth 2 (two) times daily. What changed: additional instructions   nicotine 21 mg/24hr patch Commonly known as: NICODERM CQ - dosed in mg/24 hours Place 1 patch (21 mg total) onto the skin daily. Start taking on: May 08, 2019   nystatin powder Generic drug: nystatin Apply topically 2 (two) times daily as needed. For fungal growth   omeprazole 20 MG capsule Commonly known as: PRILOSEC Take 20 mg by mouth daily.   OXYGEN Inhale 3 L into the lungs continuous.   potassium chloride 10 MEQ tablet Commonly known as: KLOR-CON Take 10 mEq by mouth daily.   prochlorperazine 10 MG tablet Commonly known as: COMPAZINE Take 10 mg by mouth every 6 (six) hours as needed for nausea or vomiting.   rosuvastatin 40 MG tablet Commonly known as: CRESTOR Take 40 mg by mouth every evening.   tiotropium 18 MCG inhalation capsule Commonly known as: SPIRIVA Place 18 mcg into inhaler and inhale daily.       Contact information for follow-up providers    [facility], [facility]. Schedule an appointment as soon as possible for a visit.   Specialty: Geriatric Medicine Contact information: [address] [place] [address] [phone]            Contact information for after-discharge  care    Destination    [facility] .   Service: Skilled Nursing Contact information: [address] [place] [address] [phone]                 No Known Allergies  Consultations:  None  Procedures/Studies: Dg Chest Port 1 View  Result Date: 05/01/2019 CLINICAL DATA:  Hypoxia.  COVID-19 positive. EXAM: PORTABLE CHEST 1 VIEW COMPARISON:  09/03/2018.  09/01/2018.06/14/2018.  03/03/2018. FINDINGS: Stable cardiomegaly. Prominent bilateral interstitial prominence. No pleural effusion or pneumothorax. No acute bony abnormality. IMPRESSION: 1. Prominent bilateral  interstitial prominence. Pneumonitis could present in this fashion with known COVID-19 infection. 2.  Cardiomegaly. Electronically Signed   By: Maisie Fus  Register   On: 05/01/2019 11:57    Subjective: Feels well. Confused but denies any shortness of breath or pain. Has not required any anxiety medications for several days and no pain medications since admission.  Discharge Exam: Vitals:   05/07/19 0400 05/07/19 0719  BP: 119/60 119/61  Pulse:  72  Resp:  (!) 22  Temp: 98 F (36.7 C) 99.3 F (37.4 C)  SpO2:  90%   General: Pt is alert, awake, not in acute distress Cardiovascular: RRR, S1/S2 +, no rubs, no gallops Respiratory: Nonlabored without crackles or wheezing Abdominal: Soft, NT, ND, bowel sounds + Extremities: No edema, no cyanosis  Labs: BNP (last 3 results) Recent Labs    06/14/18 1542  BNP 90.0   Basic Metabolic Panel: Recent Labs  Lab 05/01/19 1131 05/02/19 0322 05/03/19 0150 05/04/19 0330 05/05/19 0301 05/06/19 0115  NA 142  --  139 140 145 143  K 3.3*  --  3.5 3.9 3.9 3.9  CL 91*  --  88* 88* 90* 88*  CO2 41*  --  41* 43* 46* 46*  GLUCOSE 75  --  191* 253* 198* 255*  BUN 19  --  26* 27* 24* 28*  CREATININE 0.84 0.85 0.92 0.70 0.80 0.75  CALCIUM 8.8*  --  8.7* 8.7* 8.9 9.0  MG  --   --   --  1.6* 1.8 2.1   Liver Function Tests: Recent Labs  Lab 05/01/19 1131 05/03/19 0150 05/04/19 0330 05/05/19 0301 05/06/19 0115  AST 23 13* 13* 15 18  ALT 26 18 15 15 16   ALKPHOS 58 45 44 43 46  BILITOT 0.5 0.6 0.6 0.2* 0.4  PROT 7.1 5.8* 6.1* 5.8* 5.7*  ALBUMIN 3.2* 2.6* 2.6* 2.4* 2.5*   No results for input(s): LIPASE, AMYLASE in the last 168 hours. No results for input(s): AMMONIA in the last 168 hours. CBC: Recent Labs  Lab 05/01/19 1131 05/02/19 0322 05/05/19 0301 05/06/19 0115  WBC 6.2 6.9 3.7* 4.1  NEUTROABS 5.1  --  2.8  --   HGB 9.8* 8.7* 8.5* 8.5*  HCT 36.5 32.9* 31.5* 31.8*  MCV 85.7 90.4 88.2 86.6  PLT 208 213 279 319   Cardiac  Enzymes: No results for input(s): CKTOTAL, CKMB, CKMBINDEX, TROPONINI in the last 168 hours. BNP: Invalid input(s): POCBNP CBG: Recent Labs  Lab 05/06/19 0729 05/06/19 1200 05/06/19 1648 05/06/19 2131 05/07/19 0724  GLUCAP 217* 266* 307* 234* 230*   D-Dimer No results for input(s): DDIMER in the last 72 hours. Hgb A1c No results for input(s): HGBA1C in the last 72 hours. Lipid Profile No results for input(s): CHOL, HDL, LDLCALC, TRIG, CHOLHDL, LDLDIRECT in the last 72 hours. Thyroid function studies No results for input(s): TSH, T4TOTAL, T3FREE, THYROIDAB in the last  72 hours.  Invalid input(s): FREET3 Anemia work up No results for input(s): VITAMINB12, FOLATE, FERRITIN, TIBC, IRON, RETICCTPCT in the last 72 hours. Urinalysis    Component Value Date/Time   COLORURINE AMBER (A) 05/01/2019 1132   APPEARANCEUR CLOUDY (A) 05/01/2019 1132   APPEARANCEUR Clear 07/24/2013 1152   LABSPEC 1.021 05/01/2019 1132   LABSPEC 1.010 07/24/2013 1152   PHURINE 5.0 05/01/2019 1132   GLUCOSEU NEGATIVE 05/01/2019 1132   GLUCOSEU Negative 07/24/2013 1152   HGBUR SMALL (A) 05/01/2019 1132   BILIRUBINUR NEGATIVE 05/01/2019 1132   BILIRUBINUR Negative 07/24/2013 1152   KETONESUR NEGATIVE 05/01/2019 1132   PROTEINUR 100 (A) 05/01/2019 1132   NITRITE POSITIVE (A) 05/01/2019 1132   LEUKOCYTESUR TRACE (A) 05/01/2019 1132   LEUKOCYTESUR Negative 07/24/2013 1152    Microbiology Recent Results (from the past 240 hour(s))  Blood Culture (routine x 2)     Status: None   Collection Time: 05/01/19 11:28 AM   Specimen: BLOOD  Result Value Ref Range Status   Specimen Description BLOOD BLOOD LEFT FOREARM  Final   Special Requests   Final    BOTTLES DRAWN AEROBIC AND ANAEROBIC Blood Culture adequate volume   Culture   Final    NO GROWTH 5 DAYS Performed at [facility], [address] [place], [place] [address]    Report Status 05/06/2019 FINAL  Final  Urine Culture     Status:  Abnormal   Collection Time: 05/01/19 11:28 AM   Specimen: Urine, Random  Result Value Ref Range Status   Specimen Description   Final    URINE, RANDOM Performed at [facility], [address] [place], [place] [address]    Special Requests   Final    Normal Performed at [facility], [address] [place], [place] [address]    Culture >=100,000 COLONIES/mL CITROBACTER BRAAKII (A)  Final   Report Status 05/04/2019 FINAL  Final   Organism ID, Bacteria CITROBACTER BRAAKII (A)  Final      Susceptibility   Citrobacter braakii - MIC*    CEFAZOLIN >=64 RESISTANT Resistant     CEFTRIAXONE <=1 SENSITIVE Sensitive     CIPROFLOXACIN <=0.25 SENSITIVE Sensitive     GENTAMICIN <=1 SENSITIVE Sensitive     IMIPENEM <=0.25 SENSITIVE Sensitive     NITROFURANTOIN <=16 SENSITIVE Sensitive     TRIMETH/SULFA <=20 SENSITIVE Sensitive     PIP/TAZO <=4 SENSITIVE Sensitive     * >=100,000 COLONIES/mL CITROBACTER BRAAKII  Blood Culture (routine x 2)     Status: None   Collection Time: 05/01/19 11:31 AM   Specimen: BLOOD  Result Value Ref Range Status   Specimen Description BLOOD RIGHT ANTECUBITAL  Final   Special Requests   Final    BOTTLES DRAWN AEROBIC AND ANAEROBIC Blood Culture adequate volume   Culture   Final    NO GROWTH 5 DAYS Performed at [facility], [address] [place], [place] [address]    Report Status 05/06/2019 FINAL  Final    Time coordinating discharge: Approximately 40 minutes  Tyrone Nineyan B , MD  [facility] 05/07/2019, 11:08 AM

## 2019-05-07 NOTE — Clinical Social Work Note (Signed)
Clinical Social Worker attempted to facilitate patient discharge today, however [facility] states that they are not able to get patient medication from the pharmacy.  CSW updated patient niece and MD regarding plans to discharge Monday morning.  CSW remains available for support and to facilitate patient discharge needs.  Barbette Or, [place]

## 2019-05-08 ENCOUNTER — Other Ambulatory Visit: Payer: Self-pay

## 2019-05-08 ENCOUNTER — Emergency Department (HOSPITAL_COMMUNITY): Payer: Medicare Other

## 2019-05-08 ENCOUNTER — Inpatient Hospital Stay (HOSPITAL_COMMUNITY)
Admission: EM | Admit: 2019-05-08 | Discharge: 2019-05-14 | DRG: 189 | Disposition: A | Discharge status: Skilled Nursing Facility | Payer: Medicare Other | Attending: Internal Medicine | Admitting: Internal Medicine

## 2019-05-08 ENCOUNTER — Encounter (HOSPITAL_COMMUNITY): Payer: Self-pay

## 2019-05-08 DIAGNOSIS — I5032 Chronic diastolic (congestive) heart failure: Secondary | ICD-10-CM | POA: Diagnosis present

## 2019-05-08 DIAGNOSIS — J690 Pneumonitis due to inhalation of food and vomit: Secondary | ICD-10-CM | POA: Diagnosis present

## 2019-05-08 DIAGNOSIS — I10 Essential (primary) hypertension: Secondary | ICD-10-CM | POA: Diagnosis present

## 2019-05-08 DIAGNOSIS — Z79899 Other long term (current) drug therapy: Secondary | ICD-10-CM

## 2019-05-08 DIAGNOSIS — E119 Type 2 diabetes mellitus without complications: Secondary | ICD-10-CM | POA: Diagnosis present

## 2019-05-08 DIAGNOSIS — Z833 Family history of diabetes mellitus: Secondary | ICD-10-CM

## 2019-05-08 DIAGNOSIS — K5900 Constipation, unspecified: Secondary | ICD-10-CM | POA: Diagnosis present

## 2019-05-08 DIAGNOSIS — F0390 Unspecified dementia without behavioral disturbance: Secondary | ICD-10-CM | POA: Diagnosis present

## 2019-05-08 DIAGNOSIS — Z9981 Dependence on supplemental oxygen: Secondary | ICD-10-CM

## 2019-05-08 DIAGNOSIS — U071 COVID-19: Secondary | ICD-10-CM

## 2019-05-08 DIAGNOSIS — E039 Hypothyroidism, unspecified: Secondary | ICD-10-CM | POA: Diagnosis present

## 2019-05-08 DIAGNOSIS — E785 Hyperlipidemia, unspecified: Secondary | ICD-10-CM | POA: Diagnosis present

## 2019-05-08 DIAGNOSIS — Z7984 Long term (current) use of oral hypoglycemic drugs: Secondary | ICD-10-CM

## 2019-05-08 DIAGNOSIS — Z7901 Long term (current) use of anticoagulants: Secondary | ICD-10-CM

## 2019-05-08 DIAGNOSIS — Z515 Encounter for palliative care: Secondary | ICD-10-CM

## 2019-05-08 DIAGNOSIS — Z87891 Personal history of nicotine dependence: Secondary | ICD-10-CM

## 2019-05-08 DIAGNOSIS — J441 Chronic obstructive pulmonary disease with (acute) exacerbation: Secondary | ICD-10-CM | POA: Diagnosis present

## 2019-05-08 DIAGNOSIS — R531 Weakness: Secondary | ICD-10-CM

## 2019-05-08 DIAGNOSIS — J440 Chronic obstructive pulmonary disease with acute lower respiratory infection: Secondary | ICD-10-CM | POA: Diagnosis present

## 2019-05-08 DIAGNOSIS — I110 Hypertensive heart disease with heart failure: Secondary | ICD-10-CM | POA: Diagnosis present

## 2019-05-08 DIAGNOSIS — Z6833 Body mass index (BMI) 33.0-33.9, adult: Secondary | ICD-10-CM

## 2019-05-08 DIAGNOSIS — J9622 Acute and chronic respiratory failure with hypercapnia: Secondary | ICD-10-CM | POA: Diagnosis present

## 2019-05-08 DIAGNOSIS — I4891 Unspecified atrial fibrillation: Secondary | ICD-10-CM | POA: Diagnosis present

## 2019-05-08 DIAGNOSIS — J9621 Acute and chronic respiratory failure with hypoxia: Secondary | ICD-10-CM | POA: Diagnosis not present

## 2019-05-08 DIAGNOSIS — Z66 Do not resuscitate: Secondary | ICD-10-CM | POA: Diagnosis present

## 2019-05-08 DIAGNOSIS — Z8619 Personal history of other infectious and parasitic diseases: Secondary | ICD-10-CM

## 2019-05-08 DIAGNOSIS — Z8249 Family history of ischemic heart disease and other diseases of the circulatory system: Secondary | ICD-10-CM

## 2019-05-08 DIAGNOSIS — Z7951 Long term (current) use of inhaled steroids: Secondary | ICD-10-CM

## 2019-05-08 LAB — BASIC METABOLIC PANEL
Anion gap: 10 (ref 5–15)
BUN: 30 mg/dL — ABNORMAL HIGH (ref 8–23)
CO2: 46 mmol/L — ABNORMAL HIGH (ref 22–32)
Calcium: 9.5 mg/dL (ref 8.9–10.3)
Chloride: 86 mmol/L — ABNORMAL LOW (ref 98–111)
Creatinine, Ser: 0.81 mg/dL (ref 0.44–1.00)
GFR calc Af Amer: >60 mL/min (ref >60)
GFR calc non Af Amer: >60 mL/min (ref >60)
Glucose, Bld: 216 mg/dL — ABNORMAL HIGH (ref 70–99)
Potassium: 3.8 mmol/L (ref 3.5–5.1)
Sodium: 142 mmol/L (ref 135–145)

## 2019-05-08 LAB — CBC
HCT: 34.6 % — ABNORMAL LOW (ref 36.0–46.0)
Hemoglobin: 9.5 g/dL — ABNORMAL LOW (ref 12.0–15.0)
MCH: 23.6 pg — ABNORMAL LOW (ref 26.0–34.0)
MCHC: 27.5 g/dL — ABNORMAL LOW (ref 30.0–36.0)
MCV: 85.9 fL (ref 80.0–100.0)
Platelets: 409 10*3/uL — ABNORMAL HIGH (ref 150–400)
RBC: 4.03 MIL/uL (ref 3.87–5.11)
RDW: 16.3 % — ABNORMAL HIGH (ref 11.5–15.5)
WBC: 9.5 10*3/uL (ref 4.0–10.5)
nRBC: 0.5 % — ABNORMAL HIGH (ref 0.0–0.2)

## 2019-05-08 LAB — BRAIN NATRIURETIC PEPTIDE: B Natriuretic Peptide: 86.6 pg/mL (ref 0.0–100.0)

## 2019-05-08 LAB — GLUCOSE, CAPILLARY
Glucose-Capillary: 252 mg/dL — ABNORMAL HIGH (ref 70–99)
Glucose-Capillary: 199 mg/dL — ABNORMAL HIGH (ref 70–99)

## 2019-05-08 MED ORDER — ALBUTEROL SULFATE HFA 108 (90 BASE) MCG/ACT IN AERS
4.0000 | INHALATION_SPRAY | Freq: Once | RESPIRATORY_TRACT | Status: AC
  Administered 2019-05-08: 20:00:00 4 via RESPIRATORY_TRACT
  Filled 2019-05-08: qty 6.7

## 2019-05-08 MED ORDER — IPRATROPIUM BROMIDE HFA 17 MCG/ACT IN AERS
2.0000 | INHALATION_SPRAY | Freq: Once | RESPIRATORY_TRACT | Status: AC
  Administered 2019-05-08: 22:00:00 2 via RESPIRATORY_TRACT

## 2019-05-08 MED ORDER — VANCOMYCIN HCL 10 G IV SOLR
2000.0000 mg | Freq: Once | INTRAVENOUS | Status: AC
  Administered 2019-05-09: 01:00:00 2000 mg via INTRAVENOUS
  Filled 2019-05-08: qty 2000

## 2019-05-08 MED ORDER — IPRATROPIUM BROMIDE HFA 17 MCG/ACT IN AERS
2.0000 | INHALATION_SPRAY | Freq: Once | RESPIRATORY_TRACT | Status: AC
  Administered 2019-05-08: 21:00:00 2 via RESPIRATORY_TRACT
  Filled 2019-05-08: qty 12.9

## 2019-05-08 MED ORDER — SODIUM CHLORIDE (PF) 0.9 % IJ SOLN
INTRAMUSCULAR | Status: AC
  Filled 2019-05-08: qty 50

## 2019-05-08 MED ORDER — ALBUTEROL SULFATE HFA 108 (90 BASE) MCG/ACT IN AERS
4.0000 | INHALATION_SPRAY | Freq: Once | RESPIRATORY_TRACT | Status: AC
  Administered 2019-05-08: 22:00:00 4 via RESPIRATORY_TRACT

## 2019-05-08 MED ORDER — SODIUM CHLORIDE 0.9 % IV SOLN
2.0000 g | Freq: Once | INTRAVENOUS | Status: AC
  Administered 2019-05-09: 2 g via INTRAVENOUS
  Filled 2019-05-08: qty 2

## 2019-05-08 MED ORDER — IOHEXOL 350 MG/ML SOLN
100.0000 mL | Freq: Once | INTRAVENOUS | Status: AC | PRN
  Administered 2019-05-09: 100 mL via INTRAVENOUS

## 2019-05-08 MED ORDER — METHYLPREDNISOLONE SODIUM SUCC 125 MG IJ SOLR
80.0000 mg | Freq: Once | INTRAMUSCULAR | Status: AC
  Administered 2019-05-08: 22:00:00 80 mg via INTRAVENOUS
  Filled 2019-05-08: qty 2

## 2019-05-08 MED ORDER — SODIUM CHLORIDE (PF) 0.9 % IJ SOLN
INTRAMUSCULAR | Status: AC
  Administered 2019-05-10: 22:00:00 3 mL via INTRAVENOUS
  Filled 2019-05-08: qty 50

## 2019-05-08 NOTE — H&P (Signed)
Sheila Hayes ZOX:[id number] DOB: Sep 27, 1940 DOA: 05/08/2019     PCP: Almetta Lovely, [facility]   Outpatient Specialists:   NONE    Patient arrived to ER on 05/08/19 at 1857  Patient coming from:  From facility SNF [facility]   Chief Complaint:   Chief Complaint  Patient presents with   COVID +   Shortness of Breath    HPI: Sheila Hayes is a 78 y.o. female with medical history significant of Covid infection diagnosed on 4 November, diastolic CHF, COPD, diabetes, HLD, HTN, hypothyroidism, obesity, pulmonary hypertension, MRSA bacteremia, dementia    Presented with   worsening shortness of breath was discharged yesterday after prolonged hospital stay for Covid.  Have had increased oxygen requirement today since her discharge has been of 3 to 4 L  She was treated with Remdesivir and steroids During hospital stay she developed a.fib easily rate-controlled with beta blocker and started on lovenox for stroke risk reduction. This was later converted to Eliquis  Infectious risk factors:  Reports  shortness of breath,   In  ER RAPID COVID TEST   in house testing  Pending Original COVID positive test was done at SNF No results found for: SARSCOV2NAA   Regarding pertinent Chronic problems:      Hyperlipidemia - on statins Crestor   HTN on lopressor   CHF diastolic - last echo 10/10/2017  LV EF: 55% -   60% On lasix 20 mg  A day    DM 2 -  Lab Results  Component Value Date   HGBA1C 5.4 05/02/2019    PO meds only,        Hypothyroidism:  Lab Results  Component Value Date   TSH 0.366 05/04/2019   on synthroid   obesity-   BMI Readings from Last 1 Encounters:  05/01/19 33.13 kg/m      COPD -    on baseline oxygen 3-4L,      A. Fib -  - CHA2DS2 vas score > :  current  on anticoagulation  Eliquis,           -  Rate control:  Currently controlled with  metoprolol,            While in ER:  The following Work up has been ordered so far:  Orders  Placed This Encounter  Procedures   XR Chest Portable   CBC   BMET   Brain natriuretic peptide   Check temperature   Vital signs   Consult to hospitalist  ALL PATIENTS BEING ADMITTED/HAVING PROCEDURES NEED COVID-19 SCREENING   Oxygen therapy Mode or (Route): Nasal cannula; Liters Per Minute: 4   EKG 12-Lead   EKG 12-Lead     Following Medications were ordered in ER: Medications  albuterol (VENTOLIN HFA) 108 (90 Base) MCG/ACT inhaler 4 puff (4 puffs Inhalation Given 05/08/19 1950)  ipratropium (ATROVENT HFA) inhaler 2 puff (2 puffs Inhalation Given 05/08/19 2030)  albuterol (VENTOLIN HFA) 108 (90 Base) MCG/ACT inhaler 4 puff (4 puffs Inhalation Given 05/08/19 2133)  ipratropium (ATROVENT HFA) inhaler 2 puff (2 puffs Inhalation Given 05/08/19 2133)  albuterol (VENTOLIN HFA) 108 (90 Base) MCG/ACT inhaler 4 puff (4 puffs Inhalation Given 05/08/19 2201)  ipratropium (ATROVENT HFA) inhaler 2 puff (2 puffs Inhalation Given 05/08/19 2201)  methylPREDNISolone sodium succinate (SOLU-MEDROL) 125 mg/2 mL injection 80 mg (80 mg Intravenous Given 05/08/19 2159)        Consult Orders  (From admission, onward)  Start     Ordered   05/08/19 2139  Consult to hospitalist  ALL PATIENTS BEING ADMITTED/HAVING PROCEDURES NEED COVID-19 SCREENING  Once    Comments: ALL PATIENTS BEING ADMITTED/HAVING PROCEDURES NEED COVID-19 SCREENING  Provider:  (Not yet assigned)  Question Answer Comment  Place call to: [facility]   Reason for Consult Admit      05/08/19 2138          Significant initial  Findings: Abnormal Labs Reviewed  CBC - Abnormal; Notable for the following components:      Result Value   Hemoglobin 9.5 (*)    HCT 34.6 (*)    MCH 23.6 (*)    MCHC 27.5 (*)    RDW 16.3 (*)    Platelets 409 (*)    nRBC 0.5 (*)    All other components within normal limits  BASIC METABOLIC PANEL - Abnormal; Notable for the following components:   Chloride 86 (*)    CO2 46  (*)    Glucose, Bld 216 (*)    BUN 30 (*)    All other components within normal limits     Otherwise labs showing:    Recent Labs  Lab 05/03/19 0150 05/04/19 0330 05/05/19 0301 05/06/19 0115 05/08/19 1949  NA 139 140 145 143 142  K 3.5 3.9 3.9 3.9 3.8  CO2 41* 43* 46* 46* 46*  GLUCOSE 191* 253* 198* 255* 216*  BUN 26* 27* 24* 28* 30*  CREATININE 0.92 0.70 0.80 0.75 0.81  CALCIUM 8.7* 8.7* 8.9 9.0 9.5  MG  --  1.6* 1.8 2.1  --     Cr  Stable,  Lab Results  Component Value Date   CREATININE 0.81 05/08/2019   CREATININE 0.75 05/06/2019   CREATININE 0.80 05/05/2019    Recent Labs  Lab 05/03/19 0150 05/04/19 0330 05/05/19 0301 05/06/19 0115  AST 13* 13* 15 18  ALT 18 15 15 16   ALKPHOS 45 44 43 46  BILITOT 0.6 0.6 0.2* 0.4  PROT 5.8* 6.1* 5.8* 5.7*  ALBUMIN 2.6* 2.6* 2.4* 2.5*   Lab Results  Component Value Date   CALCIUM 9.5 05/08/2019   PHOS 2.9 06/15/2018      WBC      Component Value Date/Time   WBC 9.5 05/08/2019 1949   ANC    Component Value Date/Time   NEUTROABS 2.8 05/05/2019 0301   NEUTROABS 5.5 02/06/2014 0508   ALC No components found for: LYMPHAB   Plt: Lab Results  Component Value Date   PLT 409 (H) 05/08/2019    Lactic Acid, Venous    Component Value Date/Time   LATICACIDVEN 1.4 05/01/2019 1131    Procalcitonin  Ordered   COVID-19 Labs  Recent Labs    05/06/19 0115 05/07/19 0325  CRP 6.1* 3.3*    No results found for: SARSCOV2NAA   HG/HCT stable,       Component Value Date/Time   HGB 9.5 (L) 05/08/2019 1949   HGB 9.8 (L) 02/06/2014 0508   HCT 34.6 (L) 05/08/2019 1949   HCT 30.0 (L) 02/06/2014 0508    Troponin  7     ECG: Ordered Personally reviewed by me showing: HR : 79 Rhythm:  NSR   no evidence of ischemic changes QTC 491   BNP (last 3 results) Recent Labs    06/14/18 1542 05/08/19 1949  BNP 90.0 86.6    ProBNP (last 3 results) No results for input(s): PROBNP in the last 8760 hours.  DM  labs:  HbA1C: Recent Labs    05/02/19 0322  HGBA1C 5.4     CBG (last 3)  Recent Labs    05/07/19 2031 05/08/19 0735 05/08/19 1156  GLUCAP 223* 252* 199*     UA ordered      CXR - Increasing lower lung predominant airspace opacity greater on the right compatible with progressive COVID-19 pneumonia.   CTA no evidence PE evidence of Right lobe PNA   ED Triage Vitals  Enc Vitals Group     BP 05/08/19 1939 135/64     Pulse Rate 05/08/19 1939 84     Resp 05/08/19 1939 (!) 24     Temp 05/08/19 1939 98.1 F (36.7 C)     Temp Source 05/08/19 1939 Oral     SpO2 05/08/19 1939 94 %     Weight --      Height --      Head Circumference --      Peak Flow --      Pain Score 05/08/19 1922 0     Pain Loc --      Pain Edu? --      Excl. in GC? --   TMAX(24)@       Latest  Blood pressure 120/85, pulse 97, temperature 98.1 F (36.7 C), temperature source Oral, resp. rate (!) 24, SpO2 91 %.    Hospitalist was called for admission for COVID PNA   Review of Systems:    Pertinent positives include:  shortness of breath at rest. dyspnea on exertion  Constitutional:  No weight loss, night sweats, Fevers, chills, fatigue, weight loss  HEENT:  No headaches, Difficulty swallowing,Tooth/dental problems,Sore throat,  No sneezing, itching, ear ache, nasal congestion, post nasal drip,  Cardio-vascular:  No chest pain, Orthopnea, PND, anasarca, dizziness, palpitations.no Bilateral lower extremity swelling  GI:  No heartburn, indigestion, abdominal pain, nausea, vomiting, diarrhea, change in bowel habits, loss of appetite, melena, blood in stool, hematemesis Resp:  no, No excess mucus, no productive cough, No non-productive cough, No coughing up of blood.No change in color of mucus.No wheezing. Skin:  no rash or lesions. No jaundice GU:  no dysuria, change in color of urine, no urgency or frequency. No straining to urinate.  No flank pain.  Musculoskeletal:  No joint pain or no  joint swelling. No decreased range of motion. No back pain.  Psych:  No change in mood or affect. No depression or anxiety. No memory loss.  Neuro: no localizing neurological complaints, no tingling, no weakness, no double vision, no gait abnormality, no slurred speech, no confusion  All systems reviewed and apart from HOPI all are negative  Past Medical History:   Past Medical History:  Diagnosis Date   Chronic diastolic congestive heart failure (HCC)    COPD (chronic obstructive pulmonary disease) (HCC)    Diabetes mellitus without complication (HCC)    Hyperlipidemia    Hypertension    Hypothyroidism    Morbid obesity (HCC)    Pulmonary hypertension (HCC)       Past Surgical History:  Procedure Laterality Date   APPENDECTOMY     CHOLECYSTECTOMY      Social History:  Ambulatory   wheelchair      reports that she has quit smoking. She has never used smokeless tobacco. She reports that she does not drink alcohol or use drugs.   Family History:   Family History  Problem Relation Age of Onset   Heart attack Mother    Heart  attack Father    Diabetes Sister    Hypertension Sister    Hypertension Brother    Diabetes Brother    Lung cancer Brother    Colon cancer Brother     Allergies: No Known Allergies   Prior to Admission medications   Medication Sig Start Date End Date Taking? Authorizing Provider  acetaminophen (TYLENOL) 500 MG tablet Take 500 mg by mouth every 6 (six) hours as needed for headache.    [provider]  albuterol (PROVENTIL HFA;VENTOLIN HFA) 108 (90 Base) MCG/ACT inhaler Inhale 2 puffs into the lungs every 6 (six) hours as needed for wheezing or shortness of breath.    [provider]  apixaban (ELIQUIS) 5 MG TABS tablet Take 1 tablet (5 mg total) by mouth 2 (two) times daily. 05/07/19   Tyrone Nine, MD  budesonide-formoterol (SYMBICORT) 80-4.5 MCG/ACT inhaler Inhale 2 puffs into the lungs 2 (two) times  daily.    [provider]  calcium carbonate (OSCAL) 1500 (600 Ca) MG TABS tablet Take 600 mg of elemental calcium by mouth 2 (two) times daily with a meal.     [provider]  citalopram (CELEXA) 40 MG tablet Take 40 mg by mouth daily.    [provider]  dexamethasone (DECADRON) 6 MG tablet Take 1 tablet (6 mg total) by mouth daily for 3 days. 05/08/19 05/11/19  Tyrone Nine, MD  docusate sodium (COLACE) 100 MG capsule Take 200 mg by mouth every other day.     [provider]  furosemide (LASIX) 20 MG tablet Take 20 mg by mouth daily.    [provider]  gabapentin (NEURONTIN) 100 MG capsule Take 200 mg by mouth 2 (two) times daily.    [provider]  glipiZIDE (GLUCOTROL) 10 MG tablet Take 10 mg by mouth daily before breakfast.    [provider]  ipratropium-albuterol (DUONEB) 0.5-2.5 (3) MG/3ML SOLN Take 3 mLs by nebulization 3 (three) times daily.    [provider]  levothyroxine (SYNTHROID, LEVOTHROID) 125 MCG tablet Take 125 mcg by mouth daily before breakfast.    [provider]  LORazepam (ATIVAN) 0.5 MG tablet Take 1 tablet (0.5 mg total) by mouth every 8 (eight) hours as needed for anxiety. 05/07/19   Tyrone Nine, MD  metFORMIN (GLUCOPHAGE) 500 MG tablet Take 1,000 mg by mouth 2 (two) times daily with a meal.     [provider]  metoprolol tartrate (LOPRESSOR) 25 MG tablet Take 0.5 tablets (12.5 mg total) by mouth 2 (two) times daily. Patient taking differently: Take 12.5 mg by mouth 2 (two) times daily. HOLD for SBP <100 or HR <60 05/20/16   Shaune Pollack, MD  nicotine (NICODERM CQ - DOSED IN MG/24 HOURS) 21 mg/24hr patch Place 1 patch (21 mg total) onto the skin daily. 05/08/19   Tyrone Nine, MD  nystatin (NYSTATIN) powder Apply topically 2 (two) times daily as needed. For fungal growth    [provider]  omeprazole (PRILOSEC) 20 MG capsule Take 20 mg by mouth daily.    [provider]  OXYGEN Inhale 3 L into the lungs continuous.    [provider]  potassium chloride (K-DUR) 10 MEQ tablet Take 10 mEq by mouth daily.    [provider]  prochlorperazine (COMPAZINE) 10 MG tablet Take 10 mg by mouth every 6 (six) hours as needed for nausea or vomiting.    [provider]  rosuvastatin (CRESTOR) 40 MG tablet  Take 40 mg by mouth every evening.     [provider]  tiotropium (SPIRIVA) 18 MCG inhalation capsule Place 18 mcg into inhaler and inhale daily.    [provider]   Physical Exam: Blood pressure 120/85, pulse 97, temperature 98.1 F (36.7 C), temperature source Oral, resp. rate (!) 24, SpO2 91 %. 1. General:  in No  Acute distress    Chronically ill  -appearing 2. Psychological: Alert and  Oriented 3. Head/ENT:    Dry Mucous Membranes                          Head Non traumatic, neck supple                            Poor Dentition 4. SKIN:  decreased Skin turgor,  Skin clean Dry and intact no rash 5. Heart: Regular rate and rhythm no  Murmur, no Rub or gallop 6. Lungs:  no wheezes or crackles   7. Abdomen: Soft,  non-tender, Non distended  bowel sounds present 8. Lower extremities: no clubbing, cyanosis, no  edema 9. Neurologically Grossly intact, moving all 4 extremities equally  10. MSK: Normal range of motion   All other LABS:     Recent Labs  Lab 05/02/19 0322 05/05/19 0301 05/06/19 0115 05/08/19 1949  WBC 6.9 3.7* 4.1 9.5  NEUTROABS  --  2.8  --   --   HGB 8.7* 8.5* 8.5* 9.5*  HCT 32.9* 31.5* 31.8* 34.6*  MCV 90.4 88.2 86.6 85.9  PLT 213 279 319 409*     Recent Labs  Lab 05/03/19 0150 05/04/19 0330 05/05/19 0301 05/06/19 0115 05/08/19 1949  NA 139 140 145 143 142  K 3.5 3.9 3.9 3.9 3.8  CL 88* 88* 90* 88* 86*  CO2 41* 43* 46* 46* 46*  GLUCOSE 191* 253* 198* 255* 216*  BUN 26* 27* 24* 28* 30*  CREATININE 0.92 0.70 0.80 0.75 0.81  CALCIUM 8.7* 8.7* 8.9 9.0 9.5  MG  --  1.6*  1.8 2.1  --      Recent Labs  Lab 05/03/19 0150 05/04/19 0330 05/05/19 0301 05/06/19 0115  AST 13* 13* 15 18  ALT ALKPHOS 45 44 43 46  BILITOT 0.6 0.6 0.2* 0.4  PROT 5.8* 6.1* 5.8* 5.7*  ALBUMIN 2.6* 2.6* 2.4* 2.5*       Cultures:    Component Value Date/Time   SDES BLOOD RIGHT ANTECUBITAL 05/01/2019 1131   SPECREQUEST  05/01/2019 1131    BOTTLES DRAWN AEROBIC AND ANAEROBIC Blood Culture adequate volume   CULT  05/01/2019 1131    NO GROWTH 5 DAYS Performed at [facility], [address] [place] [place], [place] [address]    REPTSTATUS 05/06/2019 FINAL 05/01/2019 1131     Radiological Exams on Admission: Xr Chest Portable  Result Date: 05/08/2019 CLINICAL DATA:  78 year old female with COVID-19. Shortness of breath, hypoxia. EXAM: PORTABLE CHEST 1 VIEW COMPARISON:  Portable chest 05/01/2019 and earlier. FINDINGS: Portable AP semi upright view at 1935 hours. Moderate opacification of the right mid and lower lung. Increasing patchy left lung base opacity. Only the left upper lobe appears spared currently. Stable cardiac size and mediastinal contours. No pneumothorax. Difficult to exclude pleural effusions. Paucity of bowel gas. No acute osseous abnormality identified. IMPRESSION: Increasing lower lung predominant airspace opacity greater on the right compatible with progressive COVID-19 pneumonia.  Pleural effusions difficult to exclude. Electronically Signed   By: Odessa FlemingH  Hall M.D.   On: 05/08/2019 19:48    Chart has been reviewed   Assessment/Plan   78 y.o. female with medical history significant of Covid infection diagnosed on 4 November, diastolic CHF, COPD, diabetes, HLD, HTN, hypothyroidism, obesity, pulmonary hypertension, MRSA bacteremia Admitted for COVID PNA  Present on Admission:  Pneumonia due to COVID-19 virus -   FROM  SNF WITH KNOWN HX OF COVID19 ER Novel Corona Virus testing: Ordered 05/08/19 and is   Pending     - As per hx pt with evidence  -  new severe hypoxia  CXR: hazy bilateral peripheral opacities   CT scan worrisome for consolidation/pneumonia     Following complications noted:  will be monitoring for evidence of other complications such as PE/DVT CVA or  cardiovascular events  Plan of treatment: - [facility] and beds are available  We will obtain CTA chest to rule out PE -given Hypoxia and /or infiltrates continue steroids Decadron 6mg  q 24 hours Patient already had remdesivir - Will follow daily d.dimer - Assess for ability to prone  - Supportive management -Fluid sparing resuscitation  -Provide oxygen as needed currently on    SpO2: 94 % O2 Flow Rate (L/min): 15 L/min - IF d.dimer elvated >5 will increase dose of lovenox -Initiate antibiotics  given evidence worrisome for superinfection and worsening hypoxia CT scan showing no evidence of PE but evidence of right lower lobe pneumonia  Poor Prognostic factors  78 y.o.  Personal hx of  DM2,  COPD, HTN, obesity immunocompromised state   respiratory failure requiring >4L [place]  tachypnea, tachycardia present on admission    Will order Airborne and Contact precautions   patient DNR/DNI  Discussed with [facility] provider     COPD exacerbation (HCC) albuterol as needed COPD likely contributing to worsening hypoxia.  Continue steroids   HTN (hypertension) chronic continue home medications   HLD (hyperlipidemia) chronic continue home medications   Chronic diastolic CHF (congestive heart failure) (HCC) slightly on the dry side we will lasix for tonight and restart when able  Hypothyroidism chronic continue home medications   Acute on chronic respiratory failure with hypoxia (HCC) secondary to Covid pneumonia continue oxygen support overall poor prognosis probably would benefit from palliative care consult  DM2-  - Order Sensitive SSI    -  check TSH and HgA1C  - Hold by mouth medications     Dementia - expect some degree of  sundowning and continue home meds  Other plan as per orders.  DVT prophylaxis:  Eliquis   Code Status:    DNR/DNI as per paperwork provided   Palliative consult  Family Communication:   Family not at  Bedside    Disposition Plan:                              Back to current facility when stable                                          Consults called:    Discussed with [facility] MD  Admission status:  ED Disposition    ED Disposition Condition Comment   Admit  The patient appears reasonably stabilized for admission considering the current resources, flow, and capabilities available  in the ED at this time, and I doubt any other [facility] requiring further screening and/or treatment in the ED prior to admission is  present.        inpatient     Expect 2 midnight stay secondary to severity of patient's current illness including   hemodynamic instability despite optimal treatment (   hypoxia, hypercapnia)  Severe lab/radiological/exam abnormalities including:    worsening PNA  and extensive comorbidities including: COVID infection   DM2   CHF  COPD/asthma  Morbid Obesity   dementia   Chronic anticoagulation  That are currently affecting medical management.   I expect  patient to be hospitalized for 2 midnights requiring inpatient medical care.  Patient is at high risk for adverse outcome (such as loss of life or disability) if not treated.  Indication for inpatient stay as follows:    New or worsening hypoxia  Need for IV antibiotics,      Level of care       SDU tele indefinitely please discontinue once patient no longer qualifies  Precautions:   Airborne and Contact precautions  PPE: Used by the provider:   P100  eye Goggles,  Gloves  gown   Renatta Shrieves 05/09/2019, 1:22 AM     [facility]     after 2 AM please page floor coverage PA If 7AM-7PM, please contact the day team taking care of the patient using [email or web address]

## 2019-05-08 NOTE — Progress Notes (Signed)
Telephone call to [place] to alert her that patient cannot discharge to SNF until her hospice revocation of benefit form is signed. This will be signed by her niece Gevena Mart. Writer has alerted [employer] hospice social worker Clenton Pare who will obtain necessary signed revocation form  as soon as possible. CSW [employer] updated. Will continue to follow and communicate with hospital CSW/team. [name] BSN, RN, [place] [phone]

## 2019-05-08 NOTE — Care Management Important Message (Signed)
Important Message  Patient Details  Name: Sheila Hayes MRN: [id number] Date of Birth: February 06, 1941   Medicare Important Message Given:  Yes - Important Message mailed due to current National Emergency  Verbal consent obtained due to current National Emergency  Relationship to patient: Niece/Nephew Contact Name: Gevena Mart Call Date: 05/08/19  Time: 1612 Phone: [phone] Outcome: Spoke with contact Important Message mailed to: Patient address on file    Delorse Lek 05/08/2019, 4:12 PM

## 2019-05-08 NOTE — Progress Notes (Signed)
Patient set to discharge to [place] called for transportation. Niece, Otila Kluver, aware of discharge. Please call report to [place], [place]  [phone]

## 2019-05-08 NOTE — ED Provider Notes (Signed)
[facility]-EMERGENCY DEPT Provider Note   CSN: [id number] Arrival date & time: 05/08/19  1857     History   Chief Complaint Chief Complaint  Patient presents with  . COVID +  . Shortness of Breath    HPI Sheila Hayes is a 78 y.o. female.     Patient with hx copd, recent covid+ 11/4, just d/c from hospital yesterday for same, [facility] w report increased o2 requirement today. Pt is normally on 3-4 liters o2 per [place]. On arrival to ED patient states her breathing feels about c/w recent baseline. Denies worsening cough or increased sob. Pt denies chest pain or discomfort. No fevers. No increased leg pain or swelling. Pt indicates unsure why they sent her to ED. Pt limited historian - level 5 caveat.   The history is provided by the patient and the EMS personnel.  Shortness of Breath Associated symptoms: wheezing   Associated symptoms: no abdominal pain, no chest pain, no fever, no headaches, no neck pain, no rash, no sore throat and no vomiting     Past Medical History:  Diagnosis Date  . Chronic diastolic congestive heart failure (HCC)   . COPD (chronic obstructive pulmonary disease) (HCC)   . Diabetes mellitus without complication (HCC)   . Hyperlipidemia   . Hypertension   . Hypothyroidism   . Morbid obesity (HCC)   . Pulmonary hypertension [facility](HCC)     Patient Active Problem List   Diagnosis Date Noted  . Pneumonia due to COVID-19 virus 05/02/2019  . MRSA bacteremia 09/02/2018  . Acute respiratory failure with hypercapnia (HCC) 06/14/2018  . Acute on chronic diastolic CHF (congestive heart failure) (HCC) 02/23/2018  . Altered mental status   . Urinary tract infection without hematuria   . Acute on chronic respiratory failure with hypoxia (HCC) 01/30/2018  . Near syncope 10/09/2017  . HTN (hypertension) 10/09/2017  . Diabetes (HCC) 10/09/2017  . HLD (hyperlipidemia) 10/09/2017  . Chronic diastolic CHF (congestive heart  failure) (HCC) 10/09/2017  . Hypothyroidism 10/09/2017  . COPD exacerbation (HCC) 05/13/2016    Past Surgical History:  Procedure Laterality Date  . APPENDECTOMY    . CHOLECYSTECTOMY       OB History   No obstetric history on file.      Home Medications    Prior to Admission medications   Medication Sig Start Date End Date Taking? Authorizing Provider  acetaminophen (TYLENOL) 500 MG tablet Take 500 mg by mouth every 6 (six) hours as needed for headache.    [provider]  albuterol (PROVENTIL HFA;VENTOLIN HFA) 108 (90 Base) MCG/ACT inhaler Inhale 2 puffs into the lungs every 6 (six) hours as needed for wheezing or shortness of breath.    [provider]  apixaban (ELIQUIS) 5 MG TABS tablet Take 1 tablet (5 mg total) by mouth 2 (two) times daily. 05/07/19   Tyrone NineGrunz, Ryan B, MD  budesonide-formoterol (SYMBICORT) 80-4.5 MCG/ACT inhaler Inhale 2 puffs into the lungs 2 (two) times daily.    [provider]  calcium carbonate (OSCAL) 1500 (600 Ca) MG TABS tablet Take 600 mg of elemental calcium by mouth 2 (two) times daily with a meal.     [provider]  citalopram (CELEXA) 40 MG tablet Take 40 mg by mouth daily.    [provider]  dexamethasone (DECADRON) 6 MG tablet Take 1 tablet (6 mg total) by mouth daily for 3 days. 05/08/19 05/11/19  Tyrone NineGrunz, Ryan B, MD  docusate  sodium (COLACE) 100 MG capsule Take 200 mg by mouth every other day.     [provider]  furosemide (LASIX) 20 MG tablet Take 20 mg by mouth daily.    [provider]  gabapentin (NEURONTIN) 100 MG capsule Take 200 mg by mouth 2 (two) times daily.    [provider]  glipiZIDE (GLUCOTROL) 10 MG tablet Take 10 mg by mouth daily before breakfast.    [provider]  ipratropium-albuterol (DUONEB) 0.5-2.5 (3) MG/3ML SOLN Take 3 mLs by nebulization 3 (three) times daily.    [provider]  levothyroxine (SYNTHROID, LEVOTHROID) 125 MCG  tablet Take 125 mcg by mouth daily before breakfast.    [provider]  LORazepam (ATIVAN) 0.5 MG tablet Take 1 tablet (0.5 mg total) by mouth every 8 (eight) hours as needed for anxiety. 05/07/19   Tyrone Nine, MD  metFORMIN (GLUCOPHAGE) 500 MG tablet Take 1,000 mg by mouth 2 (two) times daily with a meal.     [provider]  metoprolol tartrate (LOPRESSOR) 25 MG tablet Take 0.5 tablets (12.5 mg total) by mouth 2 (two) times daily. Patient taking differently: Take 12.5 mg by mouth 2 (two) times daily. HOLD for SBP <100 or HR <60 05/20/16   Shaune Pollack, MD  nicotine (NICODERM CQ - DOSED IN MG/24 HOURS) 21 mg/24hr patch Place 1 patch (21 mg total) onto the skin daily. 05/08/19   Tyrone Nine, MD  nystatin (NYSTATIN) powder Apply topically 2 (two) times daily as needed. For fungal growth    [provider]  omeprazole (PRILOSEC) 20 MG capsule Take 20 mg by mouth daily.    [provider]  OXYGEN Inhale 3 L into the lungs continuous.    [provider]  potassium chloride (K-DUR) 10 MEQ tablet Take 10 mEq by mouth daily.    [provider]  prochlorperazine (COMPAZINE) 10 MG tablet Take 10 mg by mouth every 6 (six) hours as needed for nausea or vomiting.    [provider]  rosuvastatin (CRESTOR) 40 MG tablet Take 40 mg by mouth every evening.     [provider]  tiotropium (SPIRIVA) 18 MCG inhalation capsule Place 18 mcg into inhaler and inhale daily.    [provider]    Family History Family History  Problem Relation Age of Onset  . Heart attack Mother   . Heart attack Father   . Diabetes Sister   . Hypertension Sister   . Hypertension Brother   . Diabetes Brother   . Lung cancer Brother   . Colon cancer Brother     Social History Social History   Tobacco Use  . Smoking status: Former Games developer  . Smokeless tobacco: Never Used  Substance Use Topics  . Alcohol use: No  . Drug use: No      Allergies   Patient has no known allergies.   Review of Systems Review of Systems  Constitutional: Negative for fever.  HENT: Negative for sore throat.   Eyes: Negative for redness.  Respiratory: Positive for shortness of breath and wheezing.   Cardiovascular: Negative for chest pain.  Gastrointestinal: Negative for abdominal pain and vomiting.  Genitourinary: Negative for dysuria and flank pain.  Musculoskeletal: Negative for back pain and neck pain.  Skin: Negative for rash.  Neurological: Negative for headaches.  Hematological: Does not bruise/bleed easily.  Psychiatric/Behavioral: The patient is not nervous/anxious.      Physical Exam Updated Vital Signs There were no  vitals taken for this visit.  Physical Exam Vitals signs and nursing note reviewed.  Constitutional:      Appearance: Normal appearance. She is well-developed.  HENT:     Head: Atraumatic.     Nose: Nose normal.     Mouth/Throat:     Mouth: Mucous membranes are moist.  Eyes:     General: No scleral icterus.    Conjunctiva/sclera: Conjunctivae normal.  Neck:     Musculoskeletal: Normal range of motion and neck supple. No neck rigidity or muscular tenderness.     Trachea: No tracheal deviation.  Cardiovascular:     Rate and Rhythm: Normal rate and regular rhythm.     Pulses: Normal pulses.     Heart sounds: Normal heart sounds. No murmur. No friction rub. No gallop.   Pulmonary:     Effort: Pulmonary effort is normal. No respiratory distress.     Breath sounds: Wheezing present.  Abdominal:     General: Bowel sounds are normal. There is no distension.     Palpations: Abdomen is soft.     Tenderness: There is no abdominal tenderness. There is no guarding.  Genitourinary:    Comments: No cva tenderness.  Musculoskeletal:        General: No swelling or tenderness.  Skin:    General: Skin is warm and dry.     Findings: No rash.  Neurological:     Mental Status: She is alert.     Comments:  Alert, speech normal.   Psychiatric:        Mood and Affect: Mood normal.      ED Treatments / Results  Labs (all labs ordered are listed, but only abnormal results are displayed) Results for orders placed or performed during the hospital encounter of 05/08/19  CBC  Result Value Ref Range   WBC 9.5 4.0 - 10.5 K/uL   RBC 4.03 3.87 - 5.11 MIL/uL   Hemoglobin 9.5 (L) 12.0 - 15.0 g/dL   HCT 45.8 (L) 09.9 - 83.3 %   MCV 85.9 80.0 - 100.0 fL   MCH 23.6 (L) 26.0 - 34.0 pg   MCHC 27.5 (L) 30.0 - 36.0 g/dL   RDW 82.5 (H) 05.3 - 97.6 %   Platelets 409 (H) 150 - 400 K/uL   nRBC 0.5 (H) 0.0 - 0.2 %  BMET  Result Value Ref Range   Sodium 142 135 - 145 mmol/L   Potassium 3.8 3.5 - 5.1 mmol/L   Chloride 86 (L) 98 - 111 mmol/L   CO2 46 (H) 22 - 32 mmol/L   Glucose, Bld 216 (H) 70 - 99 mg/dL   BUN 30 (H) 8 - 23 mg/dL   Creatinine, Ser 7.34 0.44 - 1.00 mg/dL   Calcium 9.5 8.9 - 19.3 mg/dL   GFR calc non Af Amer >60 >60 mL/min   GFR calc Af Amer >60 >60 mL/min   Anion gap 10 5 - 15  Brain natriuretic peptide  Result Value Ref Range   B Natriuretic Peptide 86.6 0.0 - 100.0 pg/mL   Xr Chest Portable  Result Date: 05/08/2019 CLINICAL DATA:  78 year old female with COVID-19. Shortness of breath, hypoxia. EXAM: PORTABLE CHEST 1 VIEW COMPARISON:  Portable chest 05/01/2019 and earlier. FINDINGS: Portable AP semi upright view at 1935 hours. Moderate opacification of the right mid and lower lung. Increasing patchy left lung base opacity. Only the left upper lobe appears spared currently. Stable cardiac size and mediastinal contours. No pneumothorax. Difficult to exclude pleural  effusions. Paucity of bowel gas. No acute osseous abnormality identified. IMPRESSION: Increasing lower lung predominant airspace opacity greater on the right compatible with progressive COVID-19 pneumonia. Pleural effusions difficult to exclude. Electronically Signed   By: Odessa Fleming M.D.   On: 05/08/2019 19:48   Dg Chest Port 1  View  Result Date: 05/01/2019 CLINICAL DATA:  Hypoxia.  COVID-19 positive. EXAM: PORTABLE CHEST 1 VIEW COMPARISON:  09/03/2018.  09/01/2018.06/14/2018.  03/03/2018. FINDINGS: Stable cardiomegaly. Prominent bilateral interstitial prominence. No pleural effusion or pneumothorax. No acute bony abnormality. IMPRESSION: 1. Prominent bilateral interstitial prominence. Pneumonitis could present in this fashion with known COVID-19 infection. 2.  Cardiomegaly. Electronically Signed   By: Maisie Fus  Register   On: 05/01/2019 11:57    EKG EKG Interpretation  Date/Time:  Monday May 08 2019 19:45:17 EST Ventricular Rate:  79 PR Interval:    QRS Duration: 145 QT Interval:  428 QTC Calculation: 491 R Axis:   69 Text Interpretation: Sinus rhythm Right bundle branch block Non-specific ST-t changes Confirmed by Cathren Laine ([id number]) on 05/08/2019 9:36:58 PM   Radiology Xr Chest Portable  Result Date: 05/08/2019 CLINICAL DATA:  78 year old female with COVID-19. Shortness of breath, hypoxia. EXAM: PORTABLE CHEST 1 VIEW COMPARISON:  Portable chest 05/01/2019 and earlier. FINDINGS: Portable AP semi upright view at 1935 hours. Moderate opacification of the right mid and lower lung. Increasing patchy left lung base opacity. Only the left upper lobe appears spared currently. Stable cardiac size and mediastinal contours. No pneumothorax. Difficult to exclude pleural effusions. Paucity of bowel gas. No acute osseous abnormality identified. IMPRESSION: Increasing lower lung predominant airspace opacity greater on the right compatible with progressive COVID-19 pneumonia. Pleural effusions difficult to exclude. Electronically Signed   By: Odessa Fleming M.D.   On: 05/08/2019 19:48    Procedures Procedures (including critical care time)  Medications Ordered in ED Medications  albuterol (VENTOLIN HFA) 108 (90 Base) MCG/ACT inhaler 4 puff (has no administration in time range)  ipratropium (ATROVENT HFA) inhaler 2 puff (has  no administration in time range)     Initial Impression / Assessment and Plan / ED Course  I have reviewed the triage vital signs and the nursing notes.  Pertinent labs & imaging results that were available during my care of the patient were reviewed by me and considered in my medical decision making (see chart for details).  Iv ns. Continuous pulse ox and monitor. o2 - on 100% o2 mask to keep sat > 90.  RN to contact ecf for additional history.  Reviewed nursing notes and prior charts for additional history. Patient with DNR/DNI status on chart - pt confirms. Also indicates recently followed by hospice care.   Albuterol and atrovent mdi. Pcxr. Labs.  CXR reviewed/interpreted by me - infiltrates, multifocal, increased from prior.   Labs reviewed/interpreted by me - c/w prior, hco3 v high/chr co2 retention.   Patient is requiring 100% o2 to keep sats > 88-90%.   Additional albuterol and atrovent mdi txs.   Given increased dyspnea/hypoxia/increasing o2 requirement - will contact hospitalist to admit. Based on prior chart/prior admit ?whether palliative care team/plan would be of benefit.   Sheila Hayes was evaluated in Emergency Department on 05/08/2019 for the symptoms described in the history of present illness. She was evaluated in the context of the global COVID-19 pandemic, which necessitated consideration that the patient might be at risk for infection with the SARS-CoV-2 virus that causes COVID-19. Institutional protocols and algorithms that pertain to the evaluation of  patients at risk for COVID-19 are in a state of rapid change based on information released by regulatory bodies including the CDC and federal and state organizations. These policies and algorithms were followed during the patient's care in the ED.   CRITICAL CARE RE: COvid infection with acute on chronic respiratory failure with hypoxia and hypercapnea.  Performed by: Mirna Mires Total critical care time:  40 minutes Critical care time was exclusive of separately billable procedures and treating other patients. Critical care was necessary to treat or prevent imminent or life-threatening deterioration. Critical care was time spent personally by me on the following activities: development of treatment plan with patient and/or surrogate as well as nursing, discussions with consultants, evaluation of patient's response to treatment, examination of patient, obtaining history from patient or surrogate, ordering and performing treatments and interventions, ordering and review of laboratory studies, ordering and review of radiographic studies, pulse oximetry and re-evaluation of patient's condition.   Final Clinical Impressions(s) / ED Diagnoses   Final diagnoses:  None    ED Discharge Orders    None       Lajean Saver, MD 05/08/19 2137

## 2019-05-08 NOTE — ED Triage Notes (Signed)
Pt BIB [facility] from [facility] Per EMS, pt was discharged from [facility] this afternoon with diagnosis of COVID. EMS was called d/t [facility]. According to facility pt was 71% on 2L, EMS placed pt on NRB at 15L with 96%. Pt does c/o right shoulder pain. EMS administered 125mg  of solumedrol. Pt has hx of dementia.

## 2019-05-08 NOTE — Progress Notes (Signed)
Attempted to give report.  No one picked up after transfer of call.

## 2019-05-08 NOTE — Progress Notes (Signed)
Patient seen on rounds again today. Was discharged yesterday to SNF but unable to go. She has no new complaints, no events reported. Vital signs remain stable and she remains stable for discharge. Please see discharge summary from 04/06/2019 for full details.   Vance Gather, MD 05/08/2019 9:02 AM

## 2019-05-08 NOTE — Progress Notes (Signed)
Telephone call to [place] to notify her that the Revocation of Hospice Benefit form has been signed by patient's niece and patient can now discharge to [facility]. Hospice team updated.  [name] BSN, RN, [place]  [phone]

## 2019-05-09 ENCOUNTER — Encounter (HOSPITAL_COMMUNITY): Payer: Self-pay

## 2019-05-09 DIAGNOSIS — K5900 Constipation, unspecified: Secondary | ICD-10-CM | POA: Diagnosis present

## 2019-05-09 DIAGNOSIS — I5032 Chronic diastolic (congestive) heart failure: Secondary | ICD-10-CM | POA: Diagnosis present

## 2019-05-09 DIAGNOSIS — U071 COVID-19: Secondary | ICD-10-CM | POA: Diagnosis not present

## 2019-05-09 DIAGNOSIS — R531 Weakness: Secondary | ICD-10-CM | POA: Diagnosis not present

## 2019-05-09 DIAGNOSIS — J440 Chronic obstructive pulmonary disease with acute lower respiratory infection: Secondary | ICD-10-CM | POA: Diagnosis present

## 2019-05-09 DIAGNOSIS — Z7951 Long term (current) use of inhaled steroids: Secondary | ICD-10-CM | POA: Diagnosis not present

## 2019-05-09 DIAGNOSIS — J441 Chronic obstructive pulmonary disease with (acute) exacerbation: Secondary | ICD-10-CM | POA: Diagnosis not present

## 2019-05-09 DIAGNOSIS — I110 Hypertensive heart disease with heart failure: Secondary | ICD-10-CM | POA: Diagnosis present

## 2019-05-09 DIAGNOSIS — F0390 Unspecified dementia without behavioral disturbance: Secondary | ICD-10-CM | POA: Diagnosis present

## 2019-05-09 DIAGNOSIS — E039 Hypothyroidism, unspecified: Secondary | ICD-10-CM

## 2019-05-09 DIAGNOSIS — Z8249 Family history of ischemic heart disease and other diseases of the circulatory system: Secondary | ICD-10-CM | POA: Diagnosis not present

## 2019-05-09 DIAGNOSIS — Z79899 Other long term (current) drug therapy: Secondary | ICD-10-CM | POA: Diagnosis not present

## 2019-05-09 DIAGNOSIS — E119 Type 2 diabetes mellitus without complications: Secondary | ICD-10-CM | POA: Diagnosis present

## 2019-05-09 DIAGNOSIS — J9621 Acute and chronic respiratory failure with hypoxia: Secondary | ICD-10-CM | POA: Diagnosis present

## 2019-05-09 DIAGNOSIS — I10 Essential (primary) hypertension: Secondary | ICD-10-CM

## 2019-05-09 DIAGNOSIS — J1289 Other viral pneumonia: Secondary | ICD-10-CM

## 2019-05-09 DIAGNOSIS — Z6833 Body mass index (BMI) 33.0-33.9, adult: Secondary | ICD-10-CM | POA: Diagnosis not present

## 2019-05-09 DIAGNOSIS — I4891 Unspecified atrial fibrillation: Secondary | ICD-10-CM | POA: Diagnosis present

## 2019-05-09 DIAGNOSIS — E785 Hyperlipidemia, unspecified: Secondary | ICD-10-CM

## 2019-05-09 DIAGNOSIS — Z66 Do not resuscitate: Secondary | ICD-10-CM | POA: Diagnosis present

## 2019-05-09 DIAGNOSIS — Z9981 Dependence on supplemental oxygen: Secondary | ICD-10-CM | POA: Diagnosis not present

## 2019-05-09 DIAGNOSIS — Z87891 Personal history of nicotine dependence: Secondary | ICD-10-CM | POA: Diagnosis not present

## 2019-05-09 DIAGNOSIS — J9622 Acute and chronic respiratory failure with hypercapnia: Secondary | ICD-10-CM | POA: Diagnosis present

## 2019-05-09 DIAGNOSIS — Z7984 Long term (current) use of oral hypoglycemic drugs: Secondary | ICD-10-CM | POA: Diagnosis not present

## 2019-05-09 DIAGNOSIS — Z515 Encounter for palliative care: Secondary | ICD-10-CM | POA: Diagnosis present

## 2019-05-09 DIAGNOSIS — Z833 Family history of diabetes mellitus: Secondary | ICD-10-CM | POA: Diagnosis not present

## 2019-05-09 DIAGNOSIS — J690 Pneumonitis due to inhalation of food and vomit: Secondary | ICD-10-CM | POA: Diagnosis present

## 2019-05-09 DIAGNOSIS — Z7901 Long term (current) use of anticoagulants: Secondary | ICD-10-CM | POA: Diagnosis not present

## 2019-05-09 LAB — URINALYSIS, ROUTINE W REFLEX MICROSCOPIC
Bilirubin Urine: NEGATIVE
Glucose, UA: >=500 mg/dL — AB
Hgb urine dipstick: NEGATIVE
Ketones, ur: 5 mg/dL — AB
Leukocytes,Ua: NEGATIVE
Nitrite: NEGATIVE
Protein, ur: NEGATIVE mg/dL
Specific Gravity, Urine: >1.046 — ABNORMAL HIGH (ref 1.005–1.030)
pH: 5 (ref 5.0–8.0)

## 2019-05-09 LAB — GLUCOSE, CAPILLARY
Glucose-Capillary: 284 mg/dL — ABNORMAL HIGH (ref 70–99)
Glucose-Capillary: 241 mg/dL — ABNORMAL HIGH (ref 70–99)
Glucose-Capillary: 168 mg/dL — ABNORMAL HIGH (ref 70–99)

## 2019-05-09 LAB — TROPONIN I (HIGH SENSITIVITY)
Troponin I (High Sensitivity): 7 ng/L (ref <18)
Troponin I (High Sensitivity): 7 ng/L (ref <18)

## 2019-05-09 LAB — C-REACTIVE PROTEIN: CRP: 1.5 mg/dL — ABNORMAL HIGH (ref <1.0)

## 2019-05-09 LAB — LACTATE DEHYDROGENASE: LDH: 291 U/L — ABNORMAL HIGH (ref 98–192)

## 2019-05-09 LAB — FERRITIN: Ferritin: 16 ng/mL (ref 11–307)

## 2019-05-09 LAB — CBG MONITORING, ED: Glucose-Capillary: 302 mg/dL — ABNORMAL HIGH (ref 70–99)

## 2019-05-09 LAB — PROCALCITONIN: Procalcitonin: <0.1 ng/mL

## 2019-05-09 MED ORDER — GABAPENTIN 100 MG PO CAPS
200.0000 mg | ORAL_CAPSULE | Freq: Two times a day (BID) | ORAL | Status: DC
  Administered 2019-05-09 – 2019-05-14 (×11): 200 mg via ORAL
  Filled 2019-05-09 (×12): qty 2

## 2019-05-09 MED ORDER — SODIUM CHLORIDE 0.9% FLUSH
3.0000 mL | Freq: Two times a day (BID) | INTRAVENOUS | Status: DC
  Administered 2019-05-09 – 2019-05-14 (×9): 3 mL via INTRAVENOUS

## 2019-05-09 MED ORDER — CITALOPRAM HYDROBROMIDE 10 MG PO TABS
40.0000 mg | ORAL_TABLET | Freq: Every day | ORAL | Status: DC
  Administered 2019-05-10: 40 mg via ORAL
  Filled 2019-05-09 (×2): qty 4

## 2019-05-09 MED ORDER — SODIUM CHLORIDE 0.9% FLUSH: 3.0000 mL | INTRAVENOUS | Status: DC | PRN

## 2019-05-09 MED ORDER — LEVOTHYROXINE SODIUM 75 MCG PO TABS
125.0000 ug | ORAL_TABLET | Freq: Every day | ORAL | Status: DC
  Administered 2019-05-09 – 2019-05-14 (×6): 125 ug via ORAL
  Filled 2019-05-09 (×6): qty 1

## 2019-05-09 MED ORDER — ALBUTEROL SULFATE HFA 108 (90 BASE) MCG/ACT IN AERS
2.0000 | INHALATION_SPRAY | Freq: Four times a day (QID) | RESPIRATORY_TRACT | Status: DC | PRN
  Administered 2019-05-10 – 2019-05-14 (×5): 2 via RESPIRATORY_TRACT
  Filled 2019-05-09: qty 6.7

## 2019-05-09 MED ORDER — SODIUM CHLORIDE 0.9 % IV SOLN
2.0000 g | Freq: Three times a day (TID) | INTRAVENOUS | Status: DC
  Administered 2019-05-09 – 2019-05-10 (×4): 2 g via INTRAVENOUS
  Filled 2019-05-09 (×4): qty 2

## 2019-05-09 MED ORDER — PROCHLORPERAZINE MALEATE 10 MG PO TABS
10.0000 mg | ORAL_TABLET | Freq: Four times a day (QID) | ORAL | Status: DC | PRN
  Filled 2019-05-09: qty 1

## 2019-05-09 MED ORDER — APIXABAN 5 MG PO TABS
5.0000 mg | ORAL_TABLET | Freq: Two times a day (BID) | ORAL | Status: DC
  Administered 2019-05-09 – 2019-05-14 (×10): 5 mg via ORAL
  Filled 2019-05-09 (×11): qty 1

## 2019-05-09 MED ORDER — SODIUM CHLORIDE 0.9 % IV SOLN: 250.0000 mL | INTRAVENOUS | Status: DC | PRN

## 2019-05-09 MED ORDER — INSULIN ASPART 100 UNIT/ML ~~LOC~~ SOLN
0.0000 [IU] | Freq: Every day | SUBCUTANEOUS | Status: DC
  Administered 2019-05-12: 5 [IU] via SUBCUTANEOUS
  Administered 2019-05-13: 3 [IU] via SUBCUTANEOUS
  Filled 2019-05-09: qty 0.05

## 2019-05-09 MED ORDER — ROSUVASTATIN CALCIUM 20 MG PO TABS
40.0000 mg | ORAL_TABLET | Freq: Every evening | ORAL | Status: DC
  Administered 2019-05-10: 40 mg via ORAL
  Filled 2019-05-09: qty 2

## 2019-05-09 MED ORDER — UMECLIDINIUM BROMIDE 62.5 MCG/INH IN AEPB
1.0000 | INHALATION_SPRAY | Freq: Every day | RESPIRATORY_TRACT | Status: DC
  Administered 2019-05-09 – 2019-05-14 (×6): 1 via RESPIRATORY_TRACT
  Filled 2019-05-09: qty 7

## 2019-05-09 MED ORDER — GUAIFENESIN-DM 100-10 MG/5ML PO SYRP
10.0000 mL | ORAL_SOLUTION | ORAL | Status: DC | PRN
  Administered 2019-05-10: 22:00:00 10 mL via ORAL
  Filled 2019-05-09: qty 10

## 2019-05-09 MED ORDER — MOMETASONE FURO-FORMOTEROL FUM 100-5 MCG/ACT IN AERO
2.0000 | INHALATION_SPRAY | Freq: Two times a day (BID) | RESPIRATORY_TRACT | Status: DC
  Administered 2019-05-09 – 2019-05-14 (×11): 2 via RESPIRATORY_TRACT
  Filled 2019-05-09: qty 8.8

## 2019-05-09 MED ORDER — INSULIN ASPART 100 UNIT/ML ~~LOC~~ SOLN
0.0000 [IU] | Freq: Three times a day (TID) | SUBCUTANEOUS | Status: DC
  Administered 2019-05-09: 13:00:00 3 [IU] via SUBCUTANEOUS
  Administered 2019-05-09: 17:00:00 2 [IU] via SUBCUTANEOUS
  Administered 2019-05-10: 17:00:00 7 [IU] via SUBCUTANEOUS
  Administered 2019-05-10: 09:00:00 1 [IU] via SUBCUTANEOUS
  Administered 2019-05-10: 13:00:00 7 [IU] via SUBCUTANEOUS
  Administered 2019-05-11: 17:00:00 9 [IU] via SUBCUTANEOUS
  Administered 2019-05-11: 09:00:00 2 [IU] via SUBCUTANEOUS
  Administered 2019-05-11: 12:00:00 7 [IU] via SUBCUTANEOUS
  Administered 2019-05-12: 1 [IU] via SUBCUTANEOUS
  Administered 2019-05-12: 5 [IU] via SUBCUTANEOUS
  Filled 2019-05-09: qty 0.09

## 2019-05-09 MED ORDER — DEXAMETHASONE 6 MG PO TABS
6.0000 mg | ORAL_TABLET | Freq: Every day | ORAL | Status: DC
  Administered 2019-05-10: 09:00:00 6 mg via ORAL
  Filled 2019-05-09 (×2): qty 1

## 2019-05-09 MED ORDER — ACETAMINOPHEN 325 MG PO TABS
650.0000 mg | ORAL_TABLET | Freq: Four times a day (QID) | ORAL | Status: DC | PRN
  Administered 2019-05-10: 22:00:00 650 mg via ORAL
  Filled 2019-05-09: qty 2

## 2019-05-09 MED ORDER — SODIUM CHLORIDE 0.9% FLUSH
3.0000 mL | Freq: Two times a day (BID) | INTRAVENOUS | Status: DC
  Administered 2019-05-09 – 2019-05-14 (×8): 3 mL via INTRAVENOUS

## 2019-05-09 MED ORDER — ONDANSETRON HCL 4 MG/2ML IJ SOLN: 4.0000 mg | Freq: Four times a day (QID) | INTRAMUSCULAR | Status: DC | PRN

## 2019-05-09 MED ORDER — LORAZEPAM 0.5 MG PO TABS
0.5000 mg | ORAL_TABLET | Freq: Three times a day (TID) | ORAL | Status: DC | PRN
  Administered 2019-05-09 – 2019-05-10 (×2): 0.5 mg via ORAL
  Filled 2019-05-09 (×2): qty 1

## 2019-05-09 MED ORDER — METOPROLOL TARTRATE 25 MG PO TABS
12.5000 mg | ORAL_TABLET | Freq: Two times a day (BID) | ORAL | Status: DC
  Administered 2019-05-09 – 2019-05-14 (×9): 12.5 mg via ORAL
  Filled 2019-05-09 (×10): qty 1

## 2019-05-09 MED ORDER — HYDROCODONE-ACETAMINOPHEN 5-325 MG PO TABS: 1.0000 | ORAL_TABLET | ORAL | Status: DC | PRN

## 2019-05-09 MED ORDER — DEXAMETHASONE 4 MG PO TABS: 6.0000 mg | ORAL_TABLET | Freq: Every day | ORAL | Status: DC

## 2019-05-09 MED ORDER — VANCOMYCIN HCL 10 G IV SOLR
1750.0000 mg | INTRAVENOUS | Status: DC
  Administered 2019-05-10: 02:00:00 1750 mg via INTRAVENOUS
  Filled 2019-05-09 (×2): qty 1750

## 2019-05-09 MED ORDER — ONDANSETRON HCL 4 MG PO TABS: 4.0000 mg | ORAL_TABLET | Freq: Four times a day (QID) | ORAL | Status: DC | PRN

## 2019-05-09 MED ORDER — FUROSEMIDE 20 MG PO TABS
20.0000 mg | ORAL_TABLET | Freq: Every day | ORAL | Status: DC
  Administered 2019-05-10 – 2019-05-14 (×5): 20 mg via ORAL
  Filled 2019-05-09 (×5): qty 1

## 2019-05-09 MED ORDER — TIOTROPIUM BROMIDE MONOHYDRATE 18 MCG IN CAPS: 18.0000 ug | ORAL_CAPSULE | Freq: Every day | RESPIRATORY_TRACT | Status: DC

## 2019-05-09 NOTE — Consult Note (Addendum)
Consultation Note Date: 05/09/2019   Patient Name: Sheila Hayes  DOB: 04/01/1941  MRN: [id number]  Age / Sex: 78 y.o., female  PCP: [facility] Referring Physician: Azucena Fallen, MD  Reason for Consultation: Establishing goals of care and Psychosocial/spiritual support  HPI/Patient Profile: 78 y.o. female  admitted on 05/08/2019 with a past  medical history significant of Covid infection diagnosed on 4 November, diastolic CHF, COPD, diabetes, HLD, HTN, hypothyroidism, obesity, pulmonary hypertension, MRSA bacteremia, dementia.   Presented with  worsening shortness of breath was discharged yesterday after prolonged hospital stay for Covid.  She was treated with Remdesivir and steroids.     According to family she has had continued physical, functional and cognitive decline over the past year.[facility] face treatment option decisions, advanced directive decisions and anticipatory care needs.  Clinical Assessment and Goals of Care:  This NP Lorinda Creed reviewed medical records, received report from team, and then spoke to her niece/[name] Armenta/main decision maker,  to discuss diagnosis, prognosis, GOC, EOL wishes disposition and options.  Concept of Hospice and Palliative Care were discussed  A  discussion was had today regarding advanced directives.  Concepts specific to code status, artifical feeding and hydration, continued IV antibiotics and rehospitalization was had.  The difference between a aggressive medical intervention path  and a palliative comfort care path for this patient at this time was had.  Values and goals of care important to patient and family were attempted to be elicited.  Ultimately family hope for comfort and dignity, "I don't want her to suffer"     Questions and concerns addressed.   Family encouraged to call  with questions or concerns.    PMT will continue to support holistically.    No documented HPOA.  Niece/ Eustace Quail is decision maker for this patient.     SUMMARY OF RECOMMENDATIONS    Code Status/Advance Care Planning:  DNR   Palliative Prophylaxis:  - aspiration precautions, pain assessment  Additional Recommendations (Limitations, Scope, Preferences):  Continue current treatment plan,  scheduled telephone call to [facility] tomorrow at 2:30 for further clarification of GOCs and anticipatory care needs.  Psycho-social/Spiritual:   Desire for further Chaplaincy support:no  Additional Recommendations: Education on Hospice benefit  Prognosis:   Unable to determine at this time  Discharge Planning: to be determined     Primary Diagnoses: Present on Admission: . COPD exacerbation (HCC) . HTN (hypertension) . HLD (hyperlipidemia) . Chronic diastolic CHF (congestive heart failure) (HCC) . Hypothyroidism . Acute on chronic respiratory failure with hypoxia (HCC) . Pneumonia due to COVID-19 virus   I have reviewed the medical record, interviewed the patient and family, and examined the patient. The following aspects are pertinent.  Past Medical History:  Diagnosis Date  . Chronic diastolic congestive heart failure (HCC)   . COPD (chronic obstructive pulmonary disease) (HCC)   . Diabetes mellitus without complication (HCC)   . Hyperlipidemia   . Hypertension   .  Hypothyroidism   . Morbid obesity (HCC)   . Pulmonary hypertension (HCC)    Social History   Socioeconomic History  . Marital status: Single    Spouse name: Not on file  . Number of children: Not on file  . Years of education: Not on file  . Highest education level: Not on file  Occupational History  . Not on file  Social Needs  . Financial resource strain: Not on file  . Food insecurity    Worry: Not on file    Inability: Not on file  . Transportation needs    Medical: Not on file     Non-medical: Not on file  Tobacco Use  . Smoking status: Former Games developer  . Smokeless tobacco: Never Used  Substance and Sexual Activity  . Alcohol use: No  . Drug use: No  . Sexual activity: Not on file  Lifestyle  . Physical activity    Days per week: Not on file    Minutes per session: Not on file  . Stress: Not on file  Relationships  . Social Musician on phone: Not on file    Gets together: Not on file    Attends religious service: Not on file    Active member of club or organization: Not on file    Attends meetings of clubs or organizations: Not on file    Relationship status: Not on file  Other Topics Concern  . Not on file  Social History Narrative  . Not on file   Family History  Problem Relation Age of Onset  . Heart attack Mother   . Heart attack Father   . Diabetes Sister   . Hypertension Sister   . Hypertension Brother   . Diabetes Brother   . Lung cancer Brother   . Colon cancer Brother    Scheduled Meds: . apixaban  5 mg Oral BID  . citalopram  40 mg Oral Daily  . dexamethasone  6 mg Oral Daily  . furosemide  20 mg Oral Daily  . gabapentin  200 mg Oral BID  . insulin aspart  0-5 Units Subcutaneous QHS  . insulin aspart  0-9 Units Subcutaneous TID WC  . levothyroxine  125 mcg Oral Q0600  . metoprolol tartrate  12.5 mg Oral BID  . mometasone-formoterol  2 puff Inhalation BID  . rosuvastatin  40 mg Oral QPM  . sodium chloride flush  3 mL Intravenous Q12H  . sodium chloride flush  3 mL Intravenous Q12H  . umeclidinium bromide  1 puff Inhalation Daily   Continuous Infusions: . sodium chloride    . ceFEPime (MAXIPIME) IV 2 g (05/09/19 1416)  . [START ON 05/10/2019] vancomycin     PRN Meds:.sodium chloride, acetaminophen, albuterol, guaiFENesin-dextromethorphan, HYDROcodone-acetaminophen, LORazepam, ondansetron **OR** ondansetron (ZOFRAN) IV, prochlorperazine, sodium chloride flush Medications Prior to Admission:  Prior to Admission  medications   Medication Sig Start Date End Date Taking? Authorizing Provider  acetaminophen (TYLENOL) 500 MG tablet Take 500 mg by mouth every 6 (six) hours as needed for headache.   Yes [provider]  albuterol (PROVENTIL HFA;VENTOLIN HFA) 108 (90 Base) MCG/ACT inhaler Inhale 2 puffs into the lungs every 6 (six) hours as needed for wheezing or shortness of breath.   Yes [provider]  apixaban (ELIQUIS) 5 MG TABS tablet Take 1 tablet (5 mg total) by mouth 2 (two) times daily. 05/07/19  Yes Tyrone Nine, MD  budesonide-formoterol [facility]) 80-4.5 MCG/ACT inhaler Inhale  2 puffs into the lungs 2 (two) times daily.   Yes [provider]  calcium carbonate (OSCAL) 1500 (600 Ca) MG TABS tablet Take 600 mg of elemental calcium by mouth 2 (two) times daily with a meal.    Yes [provider]  citalopram (CELEXA) 40 MG tablet Take 40 mg by mouth daily.   Yes [provider]  dexamethasone (DECADRON) 6 MG tablet Take 1 tablet (6 mg total) by mouth daily for 3 days. 05/08/19 05/11/19 Yes Patrecia Pour, MD  docusate sodium (COLACE) 100 MG capsule Take 200 mg by mouth every other day.    Yes [provider]  furosemide (LASIX) 20 MG tablet Take 20 mg by mouth daily.   Yes [provider]  gabapentin (NEURONTIN) 100 MG capsule Take 200 mg by mouth 2 (two) times daily.   Yes [provider]  glipiZIDE (GLUCOTROL) 10 MG tablet Take 10 mg by mouth daily before breakfast.   Yes [provider]  ipratropium-albuterol (DUONEB) 0.5-2.5 (3) MG/3ML SOLN Take 3 mLs by nebulization 3 (three) times daily.   Yes [provider]  levothyroxine (SYNTHROID, LEVOTHROID) 125 MCG tablet Take 125 mcg by mouth daily before breakfast.   Yes [provider]  LORazepam (ATIVAN) 0.5 MG tablet Take 1 tablet (0.5 mg total) by mouth every 8 (eight) hours as needed for anxiety. 05/07/19  Yes Patrecia Pour, MD  metFORMIN (GLUCOPHAGE) 500  MG tablet Take 1,000 mg by mouth 2 (two) times daily with a meal.    Yes [provider]  metoprolol tartrate (LOPRESSOR) 25 MG tablet Take 0.5 tablets (12.5 mg total) by mouth 2 (two) times daily. Patient taking differently: Take 12.5 mg by mouth 2 (two) times daily. HOLD for SBP <100 or HR <60 05/20/16  Yes Demetrios Loll, MD  nicotine (NICODERM CQ - DOSED IN MG/24 HOURS) 21 mg/24hr patch Place 1 patch (21 mg total) onto the skin daily. 05/08/19  Yes Patrecia Pour, MD  nystatin (NYSTATIN) powder Apply topically 2 (two) times daily as needed. For fungal growth   Yes [provider]  omeprazole (PRILOSEC) 20 MG capsule Take 20 mg by mouth daily.   Yes [provider]  OXYGEN Inhale 3 L into the lungs continuous.   Yes [provider]  potassium chloride (K-DUR) 10 MEQ tablet Take 10 mEq by mouth daily.   Yes [provider]  prochlorperazine (COMPAZINE) 10 MG tablet Take 10 mg by mouth every 6 (six) hours as needed for nausea or vomiting.   Yes [provider]  rosuvastatin (CRESTOR) 40 MG tablet Take 40 mg by mouth every evening.    Yes [provider]  tiotropium (SPIRIVA) 18 MCG inhalation capsule Place 18 mcg into inhaler and inhale daily.   Yes [provider]   No Known Allergies Review of Systems  Unable to perform ROS   Physical Exam  Vital Signs: BP (!) 111/92   Pulse 79   Temp 99.5 F (37.5 C) (Oral)   Resp (!) 21   SpO2 91%  Pain Scale: 0-10   Pain Score: Asleep   SpO2: SpO2: 91 % O2 Device:SpO2: 91 % O2 Flow Rate: .O2 Flow Rate (L/min): 3 L/min  IO: Intake/output summary:   Intake/Output Summary (Last 24 hours) at 05/09/2019 1553 Last data filed at 05/09/2019 1247 Gross per 24 hour  Intake 3 ml  Output -  Net 3 ml    LBM:   Baseline Weight:  Most recent weight:       Palliative Assessment/Data:   Discussed with bedside RN and Dr Justine NullElargawy  Time In: 1600 Time Out: 1710 Time Total: 70  minutes Greater than 50%  of this time was spent counseling and coordinating care related to the above assessment and plan.  Signed by: Lorinda CreedMary Ruhani Umland, NP   Please contact Palliative Medicine Team phone at [phone] for questions and concerns.  For individual provider: See Loretha StaplerAmion

## 2019-05-09 NOTE — ED Notes (Signed)
Attempt to decrease NRB to nasal canula. Patient O2 saturation was 88-90% on 8L. Pt placed back on NRB at 10L O2-O2 saturation was 92-94%. NRB turned back to 15L O2.

## 2019-05-09 NOTE — Progress Notes (Signed)
PROGRESS NOTE    Sheila Hayes  GEZ:[id number] DOB: 1940-07-14 DOA: 05/08/2019 PCP: [facility]   Brief Narrative:  Sheila Hayes is a 78 y.o. female with medical history significant of Covid infection diagnosed on 4 November, diastolic CHF, COPD, diabetes, HLD, HTN, hypothyroidism, obesity, pulmonary hypertension, MRSA bacteremia, dementia. Presented with   worsening shortness of breath was discharged yesterday after prolonged hospital stay for Covid.  Have had increased oxygen requirement today since her discharge has been of 3 to 4 L. She was treated with Remdesivir and steroids. During hospital stay she developed a.fib easily rate-controlled with beta blocker and started on lovenox for stroke risk reduction. This was later converted to Eliquis.  Assessment & Plan:   Active Problems:   COPD exacerbation (HCC)   HTN (hypertension)   Diabetes ([place])   HLD (hyperlipidemia)   Chronic diastolic CHF (congestive heart failure) (HCC)   Hypothyroidism   Acute on chronic respiratory failure with hypoxia (HCC)   Pneumonia due to COVID-19 virus   Acute on chronic hypoxic respiratory failure, multifactorial etiology Rule out aspiration pneumonia, COPD exacerbation, post viral inflammatory response in the setting of COVID-19 infection Acute PE ruled out -Patient to complete steroid course on 05/10/2019 -Patient already completed antiviral course previously at [facility] -CT remarkable for right middle lobe infiltrate with negative procalcitonin, highly suggestive of acute aspiration versus pneumonitis; less likely bacterial pneumonia -continue cefepime/vancomycin -Certainly could be post viral inflammatory findings although would suspect infiltrate to be less localized -Continue Symbicort/Spiriva, albuterol HFA; hold off on nebs given recent COVID-19 positive -Follow daily procalcitonin, inflammatory markers -COVID-19 given initial positive swab 05/02/2019  COVID-19 Labs  Recent Labs    05/07/19 0325 05/09/19 0001  FERRITIN  --  16  LDH  --  291*  CRP 3.3* 1.5*    A. fib, rate controlled -Resume home medications: Metoprolol -Currently rate controlled -Continue anticoagulation with Eliquis  Hypertension, essential -Continue home medications, currently well controlled  Heart failure, diastolic, not in acute exacerbation -Appears euvolemic, continue Lasix -Continue home medications  Hypothyroidism  -Continue home Synthroid  Diabetes, hold, non-insulin-dependent -Continue to hold p.o. medications, continue sliding scale insulin -Continue Lantus 8 given patient will complete steroid taper tomorrow  DVT prophylaxis: Eliquis 5 twice daily Code Status: DNR Family Communication: None present Disposition Plan: Inpatient, transferred to [facility], disposition pending clinical improvement in hypoxia  Subjective: Continues to complain of shortness of breath, dyspnea with minor exertion.  Denies chest pain, headache, fever, chills, nausea, vomiting, diarrhea, constipation.  Objective: Vitals:   05/09/19 1031 05/09/19 1101 05/09/19 1119 05/09/19 1216  BP: (!) 103/53 (!) 142/117 (!) 90/52 (!) 118/52  Pulse: 80 (!) 43 87 82  Resp: 20 (!) 22 (!) 24 (!) 22  Temp:    99.5 F (37.5 C)  TempSrc:    Oral  SpO2: 99% (!) 87% 94% 92%    Intake/Output Summary (Last 24 hours) at 05/09/2019 1415 Last data filed at 05/09/2019 1247 Gross per 24 hour  Intake 3 ml  Output -  Net 3 ml   There were no vitals filed for this visit.  Examination:  General:  Pleasantly resting in bed, No acute distress. HEENT:  Normocephalic atraumatic.  Sclerae nonicteric, noninjected.  Extraocular movements intact bilaterally. Neck:  Without mass or deformity.  Trachea is midline. Lungs: Manage breath sounds bilaterally, left apical rhonchi otherwise without rales or wheeze. Heart:  Regular rate and rhythm.  Without murmurs, rubs, or gallops. Abdomen:  Soft,  nontender, nondistended.  Without guarding or rebound. Extremities: Without cyanosis, clubbing, edema, or obvious deformity. Vascular:  Dorsalis pedis and posterior tibial pulses palpable bilaterally. Skin:  Warm and dry, no erythema, no ulcerations.  Data Reviewed: I have personally reviewed following labs and imaging studies  CBC: Recent Labs  Lab 05/05/19 0301 05/06/19 0115 05/08/19 1949  WBC 3.7* 4.1 9.5  NEUTROABS 2.8  --   --   HGB 8.5* 8.5* 9.5*  HCT 31.5* 31.8* 34.6*  MCV 88.2 86.6 85.9  PLT 279 319 409*   Basic Metabolic Panel: Recent Labs  Lab 05/03/19 0150 05/04/19 0330 05/05/19 0301 05/06/19 0115 05/08/19 1949  NA 139 140 145 143 142  K 3.5 3.9 3.9 3.9 3.8  CL 88* 88* 90* 88* 86*  CO2 41* 43* 46* 46* 46*  GLUCOSE 191* 253* 198* 255* 216*  BUN 26* 27* 24* 28* 30*  CREATININE 0.92 0.70 0.80 0.75 0.81  CALCIUM 8.7* 8.7* 8.9 9.0 9.5  MG  --  1.6* 1.8 2.1  --    GFR: Estimated Creatinine Clearance: 69.1 mL/min (by C-G formula based on SCr of 0.81 mg/dL). Liver Function Tests: Recent Labs  Lab 05/03/19 0150 05/04/19 0330 05/05/19 0301 05/06/19 0115  AST 13* 13* 15 18  ALT 18 15 15 16   ALKPHOS 45 44 43 46  BILITOT 0.6 0.6 0.2* 0.4  PROT 5.8* 6.1* 5.8* 5.7*  ALBUMIN 2.6* 2.6* 2.4* 2.5*   CBG: Recent Labs  Lab 05/07/19 2031 05/08/19 0735 05/08/19 1156 05/09/19 1052 05/09/19 1221  GLUCAP 223* 252* 199* 302* 241*   Anemia Panel: Recent Labs    05/09/19 0001  FERRITIN 16   Sepsis Labs: Recent Labs  Lab 05/09/19 0001  PROCALCITON <0.10    Recent Results (from the past 240 hour(s))  Blood Culture (routine x 2)     Status: None   Collection Time: 05/01/19 11:28 AM   Specimen: BLOOD  Result Value Ref Range Status   Specimen Description BLOOD BLOOD LEFT FOREARM  Final   Special Requests   Final    BOTTLES DRAWN AEROBIC AND ANAEROBIC Blood Culture adequate volume   Culture   Final    NO GROWTH 5 DAYS Performed at [facility],  [address] [place], [place] [address]    Report Status 05/06/2019 FINAL  Final  Urine Culture     Status: Abnormal   Collection Time: 05/01/19 11:28 AM   Specimen: Urine, Random  Result Value Ref Range Status   Specimen Description   Final    URINE, RANDOM Performed at [facility], [address] [place], [place] [address]    Special Requests   Final    Normal Performed at [facility], [address] [place], [place] [address]    Culture >=100,000 COLONIES/mL CITROBACTER BRAAKII (A)  Final   Report Status 05/04/2019 FINAL  Final   Organism ID, Bacteria CITROBACTER BRAAKII (A)  Final      Susceptibility   Citrobacter braakii - MIC*    CEFAZOLIN >=64 RESISTANT Resistant     CEFTRIAXONE <=1 SENSITIVE Sensitive     CIPROFLOXACIN <=0.25 SENSITIVE Sensitive     GENTAMICIN <=1 SENSITIVE Sensitive     IMIPENEM <=0.25 SENSITIVE Sensitive     NITROFURANTOIN <=16 SENSITIVE Sensitive     TRIMETH/SULFA <=20 SENSITIVE Sensitive     PIP/TAZO <=4 SENSITIVE Sensitive     * >=100,000 COLONIES/mL CITROBACTER BRAAKII  Blood Culture (routine x 2)     Status: None  Collection Time: 05/01/19 11:31 AM   Specimen: BLOOD  Result Value Ref Range Status   Specimen Description BLOOD RIGHT ANTECUBITAL  Final   Special Requests   Final    BOTTLES DRAWN AEROBIC AND ANAEROBIC Blood Culture adequate volume   Culture   Final    NO GROWTH 5 DAYS Performed at [facility], [address], [place], [place] [address]    Report Status 05/06/2019 FINAL  Final         Radiology Studies: Ct Angio Chest Pe W Or Wo Contrast  Result Date: 05/09/2019 CLINICAL DATA:  COVID.  Shortness of breath. EXAM: CT ANGIOGRAPHY CHEST WITH CONTRAST TECHNIQUE: Multidetector CT imaging of the chest was performed using the standard protocol during bolus administration of intravenous contrast. Multiplanar CT image reconstructions and MIPs were obtained to evaluate the vascular anatomy.  CONTRAST:  OMNIPAQUE IOHEXOL 350 MG/ML SOLN COMPARISON:  Chest x-ray today. FINDINGS: Cardiovascular: No filling defects in the pulmonary arteries to suggest pulmonary emboli. Mild cardiomegaly. Coronary artery and aortic atherosclerosis. No aneurysm. Mediastinum/Nodes: No mediastinal, hilar, or axillary adenopathy. Lungs/Pleura: Small bilateral pleural effusions. Airspace disease in the right middle lobe and right lower lobe concerning for pneumonia. Left base atelectasis. Moderate emphysema. Upper Abdomen: Imaging into the upper abdomen shows no acute findings. Musculoskeletal: Chest wall soft tissues are unremarkable. No acute bony abnormality. Review of the MIP images confirms the above findings. IMPRESSION: No evidence of pulmonary embolus. Small bilateral pleural effusions. Consolidation in the right middle lobe and right lower lobe concerning for pneumonia. Coronary artery disease. Aortic Atherosclerosis (ICD10-I70.0) and Emphysema (ICD10-J43.9). Electronically Signed   By: Charlett Nose M.D.   On: 05/09/2019 00:49   Xr Chest Portable  Result Date: 05/08/2019 CLINICAL DATA:  78 year old female with COVID-19. Shortness of breath, hypoxia. EXAM: PORTABLE CHEST 1 VIEW COMPARISON:  Portable chest 05/01/2019 and earlier. FINDINGS: Portable AP semi upright view at 1935 hours. Moderate opacification of the right mid and lower lung. Increasing patchy left lung base opacity. Only the left upper lobe appears spared currently. Stable cardiac size and mediastinal contours. No pneumothorax. Difficult to exclude pleural effusions. Paucity of bowel gas. No acute osseous abnormality identified. IMPRESSION: Increasing lower lung predominant airspace opacity greater on the right compatible with progressive COVID-19 pneumonia. Pleural effusions difficult to exclude. Electronically Signed   By: Odessa Fleming M.D.   On: 05/08/2019 19:48        Scheduled Meds: . apixaban  5 mg Oral BID  . citalopram  40 mg Oral Daily   . dexamethasone  6 mg Oral Daily  . gabapentin  200 mg Oral BID  . insulin aspart  0-5 Units Subcutaneous QHS  . insulin aspart  0-9 Units Subcutaneous TID WC  . levothyroxine  125 mcg Oral Q0600  . metoprolol tartrate  12.5 mg Oral BID  . mometasone-formoterol  2 puff Inhalation BID  . rosuvastatin  40 mg Oral QPM  . sodium chloride flush  3 mL Intravenous Q12H  . sodium chloride flush  3 mL Intravenous Q12H  . umeclidinium bromide  1 puff Inhalation Daily   Continuous Infusions: . sodium chloride    . ceFEPime (MAXIPIME) IV 2 g (05/09/19 0855)  . [START ON 05/10/2019] vancomycin       LOS: 0 days    Time spent:  Azucena Fallen, DO [facility]  If 7PM-7AM, please contact night-coverage [email or web address] Password [email or web address] 05/09/2019, 2:15 PM

## 2019-05-09 NOTE — Evaluation (Signed)
Clinical/Bedside Swallow Evaluation Patient Details  Name: Sheila Hayes MRN: [id number] Date of Birth: [birth year]  Today's Date: 05/09/2019 Time: SLP Start Time (ACUTE ONLY): 1600 SLP Stop Time (ACUTE ONLY): 1624 SLP Time Calculation (min) (ACUTE ONLY): 24 min  Past Medical History:  Past Medical History:  Diagnosis Date  . Chronic diastolic congestive heart failure (HCC)   . COPD (chronic obstructive pulmonary disease) (HCC)   . Diabetes mellitus without complication (HCC)   . Hyperlipidemia   . Hypertension   . Hypothyroidism   . Morbid obesity (HCC)   . Pulmonary hypertension (HCC)    Past Surgical History:  Past Surgical History:  Procedure Laterality Date  . APPENDECTOMY    . CHOLECYSTECTOMY     HPI:  78 y.o. female with medical history significant for Covid infection diagnosed on 4 November, diastolic CHF, COPD, diabetes, HLD, HTN, hypothyroidism, obesity, pulmonary hypertension, MRSA bacteremia, dementia.  Hospitalized at [facility] from 11/9-16, D/Cd to [facility], then presented to ED later that evening with worsening shortness of breath, increasing O2 requirements.  Pt underwent bedside swallow evaluation on 11/10 - mechanical soft/thin liquids were recommended due to lack of dentition; otherwise, swallow was functional and no SLP f/u was needed.    Assessment / Plan / Recommendation Clinical Impression  Pt quite lethargic.  She could be aroused briefly to participate, but did not f/c nor verbalize.  She accepted limited ice chips, teaspoons of water, and pudding.  She demonstrated poor oral recognition, oral holding, and required verbal/tactile cues to initiate a swallow - much of material spilled from mouth.  Her face/mouth was cleaned and trials ceased.  Pt is too lethargic to eat/drink.  Given that she was not dysphagic last week, anticipate that as MS improves she should be able to resume a PO diet.  Recommend  NPO except to allow sips/chips and  applesauce/pudding from floor stock if pt becomes more alert.  Give meds crushed in puree.  SLP will follow for safety/diet progression.  SLP Visit Diagnosis: Dysphagia, unspecified (R13.10)    Aspiration Risk  Moderate aspiration risk    Diet Recommendation   applesauce/pudding from floor stock; sips of water if alert, ice chips  Medication Administration: Crushed with puree    Other  Recommendations Oral Care Recommendations: Oral care QID   Follow up Recommendations Skilled Nursing facility      Frequency and Duration min 2x/week  2 weeks       Prognosis Prognosis for Safe Diet Advancement: Fair      Swallow Study   General HPI: 78 y.o. female with medical history significant for Covid infection diagnosed on 4 November, diastolic CHF, COPD, diabetes, HLD, HTN, hypothyroidism, obesity, pulmonary hypertension, MRSA bacteremia, dementia.  Hospitalized at [facility] from 11/9-16, D/Cd to [facility], then presented to ED later that evening with worsening shortness of breath, increasing O2 requirements.  Pt underwent bedside swallow evaluation on 11/10 - mechanical soft/thin liquids were recommended due to lack of dentition; otherwise, swallow was functional and no SLP f/u was needed.  Type of Study: Bedside Swallow Evaluation Previous Swallow Assessment: 05/02/19 Diet Prior to this Study: Regular;Thin liquids Temperature Spikes Noted: No Respiratory Status: Nasal cannula(3l) History of Recent Intubation: No Behavior/Cognition: Confused;Lethargic/Drowsy Oral Cavity Assessment: Within Functional Limits Oral Care Completed by SLP: No Oral Cavity - Dentition: Edentulous Self-Feeding Abilities: Total assist Patient Positioning: Upright in bed Baseline Vocal Quality: Not observed Volitional Cough: Cognitively unable to elicit Volitional Swallow: Unable to elicit  Oral/Motor/Sensory Function Overall Oral Motor/Sensory Function: Other (comment)(no deficits identified during  last week's eval)   Ice Chips Ice chips: Impaired Presentation: Spoon Oral Phase Impairments: Poor awareness of bolus Oral Phase Functional Implications: Oral residue Pharyngeal Phase Impairments: Suspected delayed Swallow;Multiple swallows   Thin Liquid Thin Liquid: Impaired Presentation: Spoon Oral Phase Impairments: Poor awareness of bolus Oral Phase Functional Implications: Oral residue    Nectar Thick Nectar Thick Liquid: Not tested   Honey Thick Honey Thick Liquid: Not tested   Puree Puree: Impaired Presentation: Spoon Oral Phase Impairments: Reduced lingual movement/coordination;Poor awareness of bolus Oral Phase Functional Implications: Oral residue   Solid     Solid: Not tested      Juan Quam Laurice 05/09/2019,4:24 PM  Estill Bamberg L. Tivis Ringer, MA CCC/SLP Acute Rehabilitation Services Office number [phone]: call this number or Secure Chat Pollyann Glen or Herbie Baltimore, SLPs, if our service needs to be reached on 11/18.

## 2019-05-09 NOTE — ED Notes (Signed)
Pt pulling at NRB and equipment. Pts agitation increased, mittens applied.

## 2019-05-09 NOTE — ED Notes (Signed)
Pt transported to CT ?

## 2019-05-09 NOTE — Progress Notes (Signed)
   05/09/19 1500  Family/Significant Other Communication  Family/Significant Other Update Called;Updated (niece, Otila Kluver )

## 2019-05-09 NOTE — ED Notes (Signed)
Pt was found standing at the foot of the bed with gown over head and NRB on bed. Pt was assisted to a laying position, then repositioned in the bed. Pt did have a skin tear to the right forearm. Skin tear was cleaned and wrapped. Attending notified of event.

## 2019-05-09 NOTE — Progress Notes (Signed)
Pharmacy Antibiotic Note  Sheila Hayes is a 78 y.o. female admitted on 05/08/2019 with pneumonia.  Pharmacy has been consulted for Vancomycin, cefepime dosing.  Plan: Vancomycin 2gm iv x1, then Vancomycin 1750 mg IV Q 24 hrs. Goal AUC 400-550. Expected AUC: 493 SCr used: 0.81 Vd: 0.5  Cefepime 2gm iv x1, then 2gm iv q8hr      Temp (24hrs), Avg:98.1 F (36.7 C), Min:98.1 F (36.7 C), Max:98.1 F (36.7 C)  Recent Labs  Lab 05/03/19 0150 05/04/19 0330 05/05/19 0301 05/06/19 0115 05/08/19 1949  WBC  --   --  3.7* 4.1 9.5  CREATININE 0.92 0.70 0.80 0.75 0.81    Estimated Creatinine Clearance: 69.1 mL/min (by C-G formula based on SCr of 0.81 mg/dL).    No Known Allergies  Antimicrobials this admission: Vancomycin 05/09/2019 >> Cefepime 05/09/2019 >>   Dose adjustments this admission: -  Microbiology results: -  Thank you for allowing pharmacy to be a part of this patient's care.  Nani Skillern Crowford 05/09/2019 4:45 AM

## 2019-05-09 NOTE — ED Notes (Addendum)
Pt continuing to remove NRB from face, O2 saturation dropped down to 72%. NRB reapplied, pt coached to perform deep breathing exercises, pt O2 saturation returned to 94% on 15L NRB.

## 2019-05-09 NOTE — ED Notes (Signed)
Pt pulled out IV

## 2019-05-09 NOTE — Plan of Care (Signed)
Readmit from SNF today. Was Dc'd yesterday from [place]. Reason for transfer was increased O2 needs. Pt arrived on 15L NRB. After arrival, placed on 3L [place]. O2 sats 91-93%. Disoriented x4.   Problem: Health Behavior/Discharge Planning: Goal: Ability to manage health-related needs will improve 05/09/2019 1836 by Ashok Croon, RN Outcome: Progressing 05/09/2019 1836 by Ashok Croon, RN Outcome: Progressing   Problem: Clinical Measurements: Goal: Ability to maintain clinical measurements within normal limits will improve 05/09/2019 1836 by Ashok Croon, RN Outcome: Progressing 05/09/2019 1836 by Ashok Croon, RN Outcome: Progressing Goal: Respiratory complications will improve 05/09/2019 1836 by Ashok Croon, RN Outcome: Progressing 05/09/2019 1836 by Ashok Croon, RN Outcome: Progressing   Problem: Nutrition: Goal: Adequate nutrition will be maintained 05/09/2019 1836 by Ashok Croon, RN Outcome: Progressing 05/09/2019 1836 by Ashok Croon, RN Outcome: Progressing

## 2019-05-09 NOTE — ED Notes (Signed)
Pt observed standing at the foot of the bed trying to pull her gown off over her head and walk toward the door. Pt had removed her NRB and was still attached to all monitoring cords. Pt was unsteady on her feet. Pt assisted to lay on the edge of the bed until further help could arrive. Pt sustained a small skin tear to right forearm. Bleeding controled and wound site was dressed. Pt repositioned in bed on supplemental O2

## 2019-05-10 ENCOUNTER — Inpatient Hospital Stay (HOSPITAL_COMMUNITY): Payer: Medicare Other

## 2019-05-10 DIAGNOSIS — Z515 Encounter for palliative care: Secondary | ICD-10-CM

## 2019-05-10 DIAGNOSIS — J9621 Acute and chronic respiratory failure with hypoxia: Principal | ICD-10-CM

## 2019-05-10 DIAGNOSIS — Z66 Do not resuscitate: Secondary | ICD-10-CM

## 2019-05-10 DIAGNOSIS — J9622 Acute and chronic respiratory failure with hypercapnia: Secondary | ICD-10-CM

## 2019-05-10 DIAGNOSIS — U071 COVID-19: Secondary | ICD-10-CM

## 2019-05-10 DIAGNOSIS — J441 Chronic obstructive pulmonary disease with (acute) exacerbation: Secondary | ICD-10-CM

## 2019-05-10 LAB — CBC WITH DIFFERENTIAL/PLATELET
Abs Immature Granulocytes: 0.17 10*3/uL — ABNORMAL HIGH (ref 0.00–0.07)
Basophils Absolute: 0 10*3/uL (ref 0.0–0.1)
Basophils Relative: 0 %
Eosinophils Absolute: 0 10*3/uL (ref 0.0–0.5)
Eosinophils Relative: 0 %
HCT: 31.2 % — ABNORMAL LOW (ref 36.0–46.0)
Hemoglobin: 8.2 g/dL — ABNORMAL LOW (ref 12.0–15.0)
Immature Granulocytes: 2 %
Lymphocytes Relative: 16 %
Lymphs Abs: 1.2 10*3/uL (ref 0.7–4.0)
MCH: 23.4 pg — ABNORMAL LOW (ref 26.0–34.0)
MCHC: 26.3 g/dL — ABNORMAL LOW (ref 30.0–36.0)
MCV: 89.1 fL (ref 80.0–100.0)
Monocytes Absolute: 0.5 10*3/uL (ref 0.1–1.0)
Monocytes Relative: 7 %
Neutro Abs: 5.3 10*3/uL (ref 1.7–7.7)
Neutrophils Relative %: 75 %
Platelets: 349 10*3/uL (ref 150–400)
RBC: 3.5 MIL/uL — ABNORMAL LOW (ref 3.87–5.11)
RDW: 16.5 % — ABNORMAL HIGH (ref 11.5–15.5)
WBC: 7.2 10*3/uL (ref 4.0–10.5)
nRBC: 0 % (ref 0.0–0.2)

## 2019-05-10 LAB — COMPREHENSIVE METABOLIC PANEL
ALT: 13 U/L (ref 0–44)
AST: 15 U/L (ref 15–41)
Albumin: 2.3 g/dL — ABNORMAL LOW (ref 3.5–5.0)
Alkaline Phosphatase: 42 U/L (ref 38–126)
Anion gap: 6 (ref 5–15)
BUN: 29 mg/dL — ABNORMAL HIGH (ref 8–23)
CO2: 47 mmol/L — ABNORMAL HIGH (ref 22–32)
Calcium: 8.5 mg/dL — ABNORMAL LOW (ref 8.9–10.3)
Chloride: 89 mmol/L — ABNORMAL LOW (ref 98–111)
Creatinine, Ser: 0.83 mg/dL (ref 0.44–1.00)
GFR calc Af Amer: >60 mL/min (ref >60)
GFR calc non Af Amer: >60 mL/min (ref >60)
Glucose, Bld: 198 mg/dL — ABNORMAL HIGH (ref 70–99)
Potassium: 3.8 mmol/L (ref 3.5–5.1)
Sodium: 142 mmol/L (ref 135–145)
Total Bilirubin: 0.6 mg/dL (ref 0.3–1.2)
Total Protein: 5 g/dL — ABNORMAL LOW (ref 6.5–8.1)

## 2019-05-10 LAB — GLUCOSE, CAPILLARY
Glucose-Capillary: 128 mg/dL — ABNORMAL HIGH (ref 70–99)
Glucose-Capillary: 139 mg/dL — ABNORMAL HIGH (ref 70–99)
Glucose-Capillary: 302 mg/dL — ABNORMAL HIGH (ref 70–99)
Glucose-Capillary: 328 mg/dL — ABNORMAL HIGH (ref 70–99)
Glucose-Capillary: 410 mg/dL — ABNORMAL HIGH (ref 70–99)

## 2019-05-10 LAB — BRAIN NATRIURETIC PEPTIDE: B Natriuretic Peptide: 85.9 pg/mL (ref 0.0–100.0)

## 2019-05-10 LAB — C-REACTIVE PROTEIN: CRP: 1 mg/dL — ABNORMAL HIGH (ref <1.0)

## 2019-05-10 LAB — PROCALCITONIN: Procalcitonin: <0.1 ng/mL

## 2019-05-10 LAB — MAGNESIUM: Magnesium: 1.9 mg/dL (ref 1.7–2.4)

## 2019-05-10 LAB — TYPE AND SCREEN
ABO/RH(D): A NEG
Antibody Screen: NEGATIVE

## 2019-05-10 LAB — D-DIMER, QUANTITATIVE: D-Dimer, Quant: 0.38 ug/mL-FEU (ref 0.00–0.50)

## 2019-05-10 MED ORDER — SODIUM CHLORIDE 0.9 % IV SOLN
3.0000 g | Freq: Three times a day (TID) | INTRAVENOUS | Status: DC
  Administered 2019-05-10 – 2019-05-12 (×7): 3 g via INTRAVENOUS
  Filled 2019-05-10: qty 3
  Filled 2019-05-10 (×5): qty 8
  Filled 2019-05-10 (×2): qty 3

## 2019-05-10 MED ORDER — METHYLPREDNISOLONE SODIUM SUCC 40 MG IJ SOLR
40.0000 mg | Freq: Every day | INTRAMUSCULAR | Status: DC
  Administered 2019-05-11 – 2019-05-13 (×3): 40 mg via INTRAVENOUS
  Filled 2019-05-10 (×3): qty 1

## 2019-05-10 MED ORDER — CITALOPRAM HYDROBROMIDE 10 MG PO TABS
20.0000 mg | ORAL_TABLET | Freq: Every day | ORAL | Status: DC
  Administered 2019-05-11 – 2019-05-14 (×4): 20 mg via ORAL
  Filled 2019-05-10 (×4): qty 2

## 2019-05-10 MED ORDER — GUAIFENESIN ER 600 MG PO TB12
1200.0000 mg | ORAL_TABLET | Freq: Two times a day (BID) | ORAL | Status: DC
  Administered 2019-05-10 – 2019-05-14 (×9): 1200 mg via ORAL
  Filled 2019-05-10 (×10): qty 2

## 2019-05-10 MED ORDER — INSULIN ASPART 100 UNIT/ML ~~LOC~~ SOLN
10.0000 [IU] | Freq: Once | SUBCUTANEOUS | Status: AC
  Administered 2019-05-10: 22:00:00 10 [IU] via SUBCUTANEOUS

## 2019-05-10 NOTE — Plan of Care (Signed)
  Problem: Health Behavior/Discharge Planning: Goal: Ability to manage health-related needs will improve Outcome: Progressing   Problem: Clinical Measurements: Goal: Ability to maintain clinical measurements within normal limits will improve Outcome: Progressing Goal: Respiratory complications will improve Outcome: Progressing   Problem: Nutrition: Goal: Adequate nutrition will be maintained Outcome: Progressing   

## 2019-05-10 NOTE — Progress Notes (Signed)
Notified MD of blood sugar of 410

## 2019-05-10 NOTE — Evaluation (Signed)
Physical Therapy Evaluation Patient Details Name: Sheila Hayes MRN: [id number] DOB: 05-Nov-1940 Today's Date: 05/10/2019   History of Present Illness  78 yo female  with PMH of CHF,COPD,HLD,HTN hypothyroid,obesity, pulmonary bacteremia, dementia, recent afib Covid diagnosed 11/4, Dc from [place] 11/16 and reeturned with increased SOB and O2 requirements  Clinical Impression  The  Patient is very sleepy. Patient's BOP monitored and noted to drop. RN aware.  Orthostatic Vitals: Pt Position BP HR  Supine 107/50 65  Sitting 107/34 73  Standing-after pivot to recliner 104/43 80  Standing 3 min nt    Patient on  3 L [place] with SPO2 > 92% through out.  Patient comes from SNF and should return.Pt admitted with above diagnosis. Pt currently with functional limitations due to the deficits listed below (see PT Problem List). Pt will benefit from skilled PT to increase their independence and safety with mobility to allow discharge to the venue listed below.       Follow Up Recommendations SNF;Supervision/Assistance - 24 hour    Equipment Recommendations  None recommended by PT    Recommendations for Other Services       Precautions / Restrictions Precautions Precautions: Fall Precaution Comments: monitor BP and sats Restrictions Weight Bearing Restrictions: No      Mobility  Bed Mobility Overal bed mobility: Needs Assistance Bed Mobility: Supine to Sit     Supine to sit: Mod assist;+2 for safety/equipment     General bed mobility comments: assist for trunk elevation, HOB elevated  Transfers Overall transfer level: Needs assistance Equipment used: Rolling walker (2 wheeled) Transfers: Stand Pivot Transfers;Sit to/from Stand Sit to Stand: Mod assist;Min assist;+2 physical assistance;+2 safety/equipment Stand pivot transfers: Min assist;+2 physical assistance;+2 safety/equipment       General transfer comment: boosting assist from EOB, assist to steady and sequence RW use with  taking steps to recliner  Ambulation/Gait                Stairs            Wheelchair Mobility    Modified Rankin (Stroke Patients Only)       Balance Overall balance assessment: Needs assistance Sitting-balance support: Feet supported Sitting balance-[name] Scale: Fair Sitting balance - Comments: able to maintain sitting balance with close minguard assist, intermittently requires minA   Standing balance support: Bilateral upper extremity supported Standing balance-[name] Scale: Poor Standing balance comment: Reliant on UE support                             Pertinent Vitals/Pain Pain Assessment: No/denies pain Pain Location: Buttocks with sitting    Home Living Family/patient expects to be discharged to:: [place]: [place] Rile - 4 wheels;Wheelchair - manual Additional Comments: prior to initial admit, pt residing in [facility], pt had d/c to SNF x1 day prior to this admission    Prior Function Level of Independence: Needs assistance   Gait / Transfers Assistance Needed: reports use of RW and w/c for mobility  ADL's / Homemaking Assistance Needed: reports staff assisted with ADL tasks        Hand Dominance   Dominant Hand: Right    Extremity/Trunk Assessment   Upper Extremity Assessment Upper Extremity Assessment: Generalized weakness    Lower Extremity Assessment Lower Extremity Assessment: Generalized weakness    Cervical / Trunk Assessment Cervical / Trunk  Assessment: Normal  Communication   Communication: HOH  Cognition Arousal/Alertness: Lethargic Behavior During Therapy: WFL for tasks assessed/performed;Agitated Overall Cognitive Status: No family/caregiver present to determine baseline cognitive functioning Area of Impairment: Orientation;Attention;Memory;Following commands;Awareness;Problem solving                 Orientation Level: Disoriented  to;Time;Situation Current Attention Level: Sustained Memory: Decreased short-term memory Following Commands: Follows one step commands with increased time Safety/Judgement: Decreased awareness of deficits   Problem Solving: Slow processing;Decreased initiation;Requires verbal cues;Requires tactile cues General Comments: pt able to tell therapist she was living at [facility], waxing/waning cognition, pleasantly confused      General Comments      Exercises     Assessment/Plan    PT Assessment Patient needs continued PT services  PT Problem List Decreased strength;Decreased activity tolerance;Decreased balance;Decreased mobility;Decreased cognition;Decreased safety awareness;Cardiopulmonary status limiting activity       PT Treatment Interventions DME instruction;Gait training;Functional mobility training;Therapeutic activities;Therapeutic exercise;Balance training;Patient/family education    PT Goals (Current goals can be found in the Care Plan section)  Acute Rehab PT Goals Patient Stated Goal: regain her strength PT Goal Formulation: With patient Time For Goal Achievement: 05/24/19 Potential to Achieve Goals: Fair    Frequency Min 2X/week   Barriers to discharge        Co-evaluation PT/OT/SLP Co-Evaluation/Treatment: Yes Reason for Co-Treatment: For patient/therapist safety PT goals addressed during session: Mobility/safety with mobility OT goals addressed during session: ADL's and self-care       AM-PAC PT "6 Clicks" Mobility  Outcome Measure Help needed turning from your back to your side while in a flat bed without using bedrails?: A Little Help needed moving from lying on your back to sitting on the side of a flat bed without using bedrails?: A Little Help needed moving to and from a bed to a chair (including a wheelchair)?: A Lot Help needed standing up from a chair using your arms (e.g., wheelchair or bedside chair)?: A Lot Help needed to walk in hospital  room?: Total Help needed climbing 3-5 steps with a railing? : Total 6 Click Score: 12    End of Session Equipment Utilized During Treatment: Oxygen Activity Tolerance: Treatment limited secondary to medical complications (Comment);Patient limited by lethargy Patient left: in chair;with call bell/phone within reach;with chair alarm set Nurse Communication: Mobility status PT Visit Diagnosis: Other abnormalities of gait and mobility (R26.89);Muscle weakness (generalized) (M62.81)    Time: 0932-3557 PT Time Calculation (min) (ACUTE ONLY): 37 min   Charges:   PT Evaluation $PT Eval Moderate Complexity: 1 Mod          Blanchard Kelch PT Acute Rehabilitation Services  Office [phone]   Rada Hay 05/10/2019, 5:31 PM

## 2019-05-10 NOTE — Progress Notes (Signed)
Please note, patient has a pending referral for [facility] community Palliative program to follow at discharge. She discharged from [facility] on 11/16 to [facility]. Her niece is hopeful patient will be able to discharge to rehab following this hospitalization. CMRN Ricki Miller made aware. [name] BSN, RN, [place] [phone]

## 2019-05-10 NOTE — Progress Notes (Signed)
Notified MD of patient desatting to 49% on 4L HFNC. Placed on NRB, increased sats to 100% and now patient able to tolerate 6L HFNC.

## 2019-05-10 NOTE — Progress Notes (Signed)
   05/09/19 2200  Provider Notification  Provider Name/Title Dr Shanon Brow  Date Provider Notified 05/09/19  Time Provider Notified 2224  Notification Type Page  Notification Reason Change in status;Other (Comment) (noted improvement in mentation)  Response See new orders  pt is now awake, conversating "I don't know what happened I went to bed last night and am just waking up here today". Pt is oriented to everything but date. Requesting drinks, passed swallow eval. MD notified, okay to increase diet.

## 2019-05-10 NOTE — Evaluation (Signed)
Occupational Therapy Evaluation Patient Details Name: Sheila Hayes MRN: [id number] DOB: [birth year] Today's Date: 05/10/2019    History of Present Illness 78 y.o.femalewith PMHx including Covid infection diagnosed on 4 November, diastolic CHF, COPD, diabetes, HLD, HTN, hypothyroidism, obesity, pulmonary hypertension, MRSA bacteremia, dementia, newly developed afib. Pt had d/c from [facility] to SNF on 11/16, now presenting/readmitted with increased SOB and O2 requirements.    Clinical Impression   This 78 y/o female presents with the above. Pt with recent admit/discharge from [facility] to SNF. Prior to her initial admission pt was living at [facility], reports use of RW/wheelchair for mobility and receiving some assist for ADL tasks. Pt now requiring +82minA for stand pivot transfers using RW, minA for seated UB ADL and maxA for LB and toileting ADL. Pt with waxing/waning cognition this session, overall pleasantly confused. Vitals monitored throughout (see below for BP), difficult to obtain accurate O2 reading, use of O2 probe via earlobe with spO2 fluctuating between high 80s-90s on 4-6L (6L HFNC start of session and returned to 6L end of session to maintain O2 >90%). Pt will benefit from continued acute OT services and recommend pt return to SNF for ST rehab services at discharge to progress her safety and independence with ADL/mobility. Will follow.  BP  Pt Position BP HR  Supine 107/50 (65)   Sitting 107/34 (53) 73  Post transfer to Recliner 104/43 (59)   End of session seated in Recliner 111/53 (68)        Follow Up Recommendations  SNF;Supervision/Assistance - 24 hour    Equipment Recommendations  None recommended by OT    Recommendations for Other Services       Precautions / Restrictions Precautions Precautions: Fall Precaution Comments: monitor BP and sats Restrictions Weight Bearing Restrictions: No      Mobility Bed Mobility Overal bed mobility: Needs Assistance Bed  Mobility: Supine to Sit     Supine to sit: Mod assist;+2 for safety/equipment     General bed mobility comments: assist for trunk elevation, HOB elevated  Transfers Overall transfer level: Needs assistance Equipment used: Rolling walker (2 wheeled) Transfers: Stand Pivot Transfers;Sit to/from Stand Sit to Stand: Mod assist;Min assist;+2 physical assistance;+2 safety/equipment Stand pivot transfers: Min assist;+2 physical assistance;+2 safety/equipment       General transfer comment: boosting assist from EOB, assist to steady and sequence RW use with taking steps to recliner    Balance Overall balance assessment: Needs assistance Sitting-balance support: Feet supported Sitting balance-[name] Scale: Fair Sitting balance - Comments: able to maintain sitting balance with close minguard assist, intermittently requires minA   Standing balance support: Bilateral upper extremity supported Standing balance-[name] Scale: Poor Standing balance comment: Reliant on UE support                           ADL either performed or assessed with clinical judgement   ADL Overall ADL's : Needs assistance/impaired Eating/Feeding: Set up;Sitting   Grooming: Wash/dry face;Set up;Min guard;Sitting   Upper Body Bathing: Minimal assistance;Sitting   Lower Body Bathing: Maximal assistance;Sit to/from stand   Upper Body Dressing : Sitting;Minimal assistance   Lower Body Dressing: Maximal assistance;Sit to/from stand   Toilet Transfer: Minimal assistance;+2 for physical assistance;+2 for safety/equipment;Stand-pivot;RW Toilet Transfer Details (indicate cue type and reason): simulated via transfer to recliner Toileting- Clothing Manipulation and Hygiene: Maximal assistance;Sit to/from stand       Functional mobility during ADLs: Minimal assistance;+2 for safety/equipment;+2 for physical assistance;Rolling walker;Cueing  for safety;Cueing for sequencing General ADL Comments: pt with  weakness, decreased activity tolerance, reports dizziness with sitting EOB (soft BP throughout)     Vision         Perception     Praxis      Pertinent Vitals/Pain Pain Assessment: No/denies pain     Hand Dominance Right   Extremity/Trunk Assessment Upper Extremity Assessment Upper Extremity Assessment: Generalized weakness   Lower Extremity Assessment Lower Extremity Assessment: Defer to PT evaluation       Communication Communication Communication: HOH   Cognition Arousal/Alertness: Awake/alert Behavior During Therapy: WFL for tasks assessed/performed;Agitated Overall Cognitive Status: No family/caregiver present to determine baseline cognitive functioning Area of Impairment: Orientation;Attention;Memory;Following commands;Awareness;Problem solving                 Orientation Level: Disoriented to;Time;Situation(states December) Current Attention Level: Sustained Memory: Decreased short-term memory Following Commands: Follows one step commands with increased time Safety/Judgement: Decreased awareness of deficits   Problem Solving: Slow processing;Decreased initiation;Requires verbal cues;Requires tactile cues General Comments: pt able to tell therapist she was living at [facility], waxing/waning cognition, pleasantly confused   General Comments       Exercises     Shoulder Instructions      Home Living Family/patient expects to be discharged to:: [place]: [place] Rile - 4 wheels;Wheelchair - manual   Additional Comments: prior to initial admit, pt residing in [facility], pt had d/c to SNF x1 day prior to this admission      Prior Functioning/Environment Level of Independence: Needs assistance  Gait / Transfers Assistance Needed: reports use of RW and w/c for mobility ADL's / Homemaking Assistance Needed: reports staff assisted with ADL tasks            OT Problem List:  Decreased strength;Decreased activity tolerance;Impaired balance (sitting and/or standing);Decreased cognition;Decreased safety awareness;Decreased knowledge of use of DME or AE;Cardiopulmonary status limiting activity;Obesity;Pain      OT Treatment/Interventions: Self-care/ADL training;Therapeutic exercise;Neuromuscular education;Energy conservation;DME and/or AE instruction;Therapeutic activities;Cognitive remediation/compensation;Patient/family education;Balance training    OT Goals(Current goals can be found in the care plan section) Acute Rehab OT Goals Patient Stated Goal: regain her strength OT Goal Formulation: With patient Time For Goal Achievement: 05/24/19 Potential to Achieve Goals: Good  OT Frequency: Min 2X/week   Barriers to D/C:            Co-evaluation PT/OT/SLP Co-Evaluation/Treatment: Yes Reason for Co-Treatment: For patient/therapist safety;To address functional/ADL transfers   OT goals addressed during session: ADL's and self-care      AM-PAC OT "6 Clicks" Daily Activity     Outcome Measure Help from another person eating meals?: A Little Help from another person taking care of personal grooming?: A Little Help from another person toileting, which includes using toliet, bedpan, or urinal?: A Lot Help from another person bathing (including washing, rinsing, drying)?: A Lot Help from another person to put on and taking off regular upper body clothing?: A Lot Help from another person to put on and taking off regular lower body clothing?: A Lot 6 Click Score: 14   End of Session Equipment Utilized During Treatment: Gait belt;Rolling walker;Oxygen Nurse Communication: Mobility status  Activity Tolerance: Patient tolerated treatment well;Patient limited by fatigue Patient left: in chair;with call bell/phone within reach;with chair alarm set  OT Visit Diagnosis: Unsteadiness on  feet (R26.81);Muscle weakness (generalized) (M62.81);Other symptoms and signs  involving cognitive function                Time: [id number] OT Time Calculation (min): 37 min Charges:  OT General Charges $OT Visit: 1 Visit OT Evaluation $OT Eval Moderate Complexity: 1 Mod  Marcy SirenBreanna Shakeela Rabadan, OT [employer] Pager [phone] Office [phone]   Orlando PennerBreanna L Roy Tokarz 05/10/2019, 3:30 PM

## 2019-05-10 NOTE — Progress Notes (Signed)
Modified Barium Swallow Progress Note  Patient Details  Name: Sheila Hayes MRN: [id number] Date of Birth: 09/20/1940  Today's Date: 05/10/2019  Modified Barium Swallow completed.  Full report located under Chart Review in the Imaging Section.  Brief recommendations include the following:  Clinical Impression  Pt demonstrates normal swallow function for age. There were consistent episodes of trace flash penetration, but otherwise swallowing musculature was strong and pt response to PO was timely. Esophageal sweep appeared WNL, no radiologist present to confirm.  Episodic aspiration in the elderly, particularly when deconditioned, are of course possible. Pt should be assisted with meal set up and upright posture but there is no evidence in this test to support modified diet or PO restrictions. Pt to resume a mechanical soft/thin liquid diet.  SLP will f/u x1 at bedside to ensure best practive with meals.    Swallow Evaluation Recommendations       SLP Diet Recommendations: Dysphagia 3 (Mech soft) solids;Thin liquid   Liquid Administration via: Cup;Straw   Medication Administration: Whole meds with liquid   Supervision: Patient able to self feed   Compensations: Slow rate;Small sips/bites   Postural Changes: Remain semi-upright after after feeds/meals (Comment);Seated upright at 90 degrees   Oral Care Recommendations: Oral care BID      Sheila Baltimore, MA CCC-SLP  Acute Rehabilitation Services Pager [phone] Office [phone]   Sheila Hayes 05/10/2019,3:28 PM

## 2019-05-10 NOTE — Progress Notes (Signed)
PROGRESS NOTE    Sheila Hayes  HUD:[id number] DOB: 21-Jun-1941 DOA: 05/08/2019 PCP: Orvis Brill, [facility]   Brief Narrative:  Sheila Hayes is a 78 y.o. female with medical history significant of Covid infection diagnosed on 4 November, diastolic CHF, COPD, diabetes, HLD, HTN, hypothyroidism, obesity, pulmonary hypertension, MRSA bacteremia, dementia. Presented with   worsening shortness of breath was discharged yesterday after prolonged hospital stay for Covid.  Have had increased oxygen requirement today since her discharge has been of 3 to 4 L. She was treated with Remdesivir and steroids. During hospital stay she developed a.fib easily rate-controlled with beta blocker and started on lovenox for stroke risk reduction. This was later converted to Eliquis.  Subjective: She reports dyspnea, mild cough, denies any fever, chest pain or chills .  Assessment & Plan:   Active Problems:   COPD exacerbation (HCC)   HTN (hypertension)   Diabetes ([place])   HLD (hyperlipidemia)   Chronic diastolic CHF (congestive heart failure) (HCC)   Hypothyroidism   Acute on chronic respiratory failure with hypoxia (HCC)   Pneumonia due to COVID-19 virus   Acute on chronic hypoxic respiratory failure. -Appears to be multifactorial, in the setting of COPD exacerbation, recent COVID-19 pneumonia, and likely aspiration pneumonia. -Acute PE is ruled out. -PICC line IV vancomycin and Zosyn, this will be changed to Unasyn to cover for aspiration pneumonia given reports coughing while eating, and her opacity in the right middle and lower lung lobes. -NG Decadron to IV Solu-Medrol to cover for COPD. -Patient already completed antiviral course previously at [facility] -CT remarkable for right middle lobe infiltrate with negative procalcitonin, highly suggestive of acute aspiration versus pneumonitis; less likely bacterial pneumonia -continue cefepime/vancomycin -Certainly could be post viral  inflammatory findings although would suspect infiltrate to be less localized -Continue Symbicort/Spiriva, albuterol HFA; hold off on nebs given recent COVID-19 positive -Follow daily procalcitonin, inflammatory markers, CRP and D-dimers within normal limit which is reassuring -COVID-19 given initial positive swab 05/02/2019 -As with staff, she was encouraged to ambulate, use incentive spirometry and flutter valve.  COVID-19 Labs  Recent Labs    05/09/19 0001 05/10/19 0315  DDIMER  --  0.38  FERRITIN 16  --   LDH 291*  --   CRP 1.5* 1.0*    A. fib, rate controlled -Heart rate controlled on metoprolol -Continue anticoagulation with Eliquis  Hypertension, essential -Continue home medications, currently well controlled  Heart failure, diastolic, not in acute exacerbation -Appears euvolemic, continue Lasix -Continue home medications  Hypothyroidism  -Continue home Synthroid  Diabetes, hold, non-insulin-dependent -Continue to hold p.o. medications, continue sliding scale insulin -Continue Lantus 8 given patient will complete steroid taper tomorrow  DVT prophylaxis: Eliquis 5 twice daily Code Status: DNR Family Communication: Disussed with patient, will update family today Disposition Plan: Inpatient, transferred to [facility], disposition pending clinical improvement in hypoxia  Subjective: Continues to complain of shortness of breath, dyspnea with minor exertion.  Denies chest pain, headache, fever, chills, nausea, vomiting, diarrhea, constipation.  Objective: Vitals:   05/10/19 0338 05/10/19 0445 05/10/19 0750 05/10/19 1115  BP: (!) 107/47 (!) 88/48 (!) 115/53 115/80  Pulse:  65 63 (!) 59  Resp:  20 (!) 23 (!) 25  Temp: 98.4 F (36.9 C) 98.2 F (36.8 C) 98.4 F (36.9 C) 98.1 F (36.7 C)  TempSrc: Oral Oral Oral Oral  SpO2: 90% 92% (!) 87% 94%    Intake/Output Summary (Last 24 hours) at 05/10/2019 1146 Last data filed at 05/10/2019  1610 Gross per 24 hour  Intake 1286  ml  Output 1125 ml  Net 161 ml   There were no vitals filed for this visit.  Examination:  Awake Alert, Oriented X 3, No new F.N deficits, Normal affect frail, on oxygen via nasal cannula Symmetrical Chest wall movement, Good air movement bilaterally, CTAB RRR,No Gallops,Rubs or new Murmurs, No Parasternal Heave +ve B.Sounds, Abd Soft, No tenderness, No rebound - guarding or rigidity. No Cyanosis, Clubbing or edema, No new Rash or bruise      Data Reviewed: I have personally reviewed following labs and imaging studies  CBC: Recent Labs  Lab 05/05/19 0301 05/06/19 0115 05/08/19 1949 05/10/19 0315  WBC 3.7* 4.1 9.5 7.2  NEUTROABS 2.8  --   --  5.3  HGB 8.5* 8.5* 9.5* 8.2*  HCT 31.5* 31.8* 34.6* 31.2*  MCV 88.2 86.6 85.9 89.1  PLT 279 319 409* 349   Basic Metabolic Panel: Recent Labs  Lab 05/04/19 0330 05/05/19 0301 05/06/19 0115 05/08/19 1949 05/10/19 0315  NA 140 145 143 142 142  K 3.9 3.9 3.9 3.8 3.8  CL 88* 90* 88* 86* 89*  CO2 43* 46* 46* 46* 47*  GLUCOSE 253* 198* 255* 216* 198*  BUN 27* 24* 28* 30* 29*  CREATININE 0.70 0.80 0.75 0.81 0.83  CALCIUM 8.7* 8.9 9.0 9.5 8.5*  MG 1.6* 1.8 2.1  --  1.9   GFR: Estimated Creatinine Clearance: 67.5 mL/min (by C-G formula based on SCr of 0.83 mg/dL). Liver Function Tests: Recent Labs  Lab 05/04/19 0330 05/05/19 0301 05/06/19 0115 05/10/19 0315  AST 13* ALT ALKPHOS 44 43 46 42  BILITOT 0.6 0.2* 0.4 0.6  PROT 6.1* 5.8* 5.7* 5.0*  ALBUMIN 2.6* 2.4* 2.5* 2.3*   CBG: Recent Labs  Lab 05/09/19 1052 05/09/19 1221 05/09/19 1548 05/09/19 1935 05/10/19 0752  GLUCAP 302* 241* 168* 128* 139*   Anemia Panel: Recent Labs    05/09/19 0001  FERRITIN 16   Sepsis Labs: Recent Labs  Lab 05/09/19 0001 05/10/19 0315  PROCALCITON <0.10 <0.10    Recent Results (from the past 240 hour(s))  Blood Culture (routine x 2)     Status: None   Collection Time: 05/01/19 11:28 AM   Specimen:  BLOOD  Result Value Ref Range Status   Specimen Description BLOOD BLOOD LEFT FOREARM  Final   Special Requests   Final    BOTTLES DRAWN AEROBIC AND ANAEROBIC Blood Culture adequate volume   Culture   Final    NO GROWTH 5 DAYS Performed at [facility], [address] [place], [place] [address]    Report Status 05/06/2019 FINAL  Final  Urine Culture     Status: Abnormal   Collection Time: 05/01/19 11:28 AM   Specimen: Urine, Random  Result Value Ref Range Status   Specimen Description   Final    URINE, RANDOM Performed at [facility], [address] [place], [place] [address]    Special Requests   Final    Normal Performed at [facility], [address] [place], [place] [address]    Culture >=100,000 COLONIES/mL CITROBACTER BRAAKII (A)  Final   Report Status 05/04/2019 FINAL  Final   Organism ID, Bacteria CITROBACTER BRAAKII (A)  Final      Susceptibility   Citrobacter braakii - MIC*    CEFAZOLIN >=64 RESISTANT Resistant     CEFTRIAXONE <=1 SENSITIVE Sensitive  CIPROFLOXACIN <=0.25 SENSITIVE Sensitive     GENTAMICIN <=1 SENSITIVE Sensitive     IMIPENEM <=0.25 SENSITIVE Sensitive     NITROFURANTOIN <=16 SENSITIVE Sensitive     TRIMETH/SULFA <=20 SENSITIVE Sensitive     PIP/TAZO <=4 SENSITIVE Sensitive     * >=100,000 COLONIES/mL CITROBACTER BRAAKII  Blood Culture (routine x 2)     Status: None   Collection Time: 05/01/19 11:31 AM   Specimen: BLOOD  Result Value Ref Range Status   Specimen Description BLOOD RIGHT ANTECUBITAL  Final   Special Requests   Final    BOTTLES DRAWN AEROBIC AND ANAEROBIC Blood Culture adequate volume   Culture   Final    NO GROWTH 5 DAYS Performed at [facility], [address] [place], [place] [address]    Report Status 05/06/2019 FINAL  Final         Radiology Studies: Ct Angio Chest Pe W Or Wo Contrast  Result Date: 05/09/2019 CLINICAL DATA:  COVID.  Shortness of breath. EXAM: CT  ANGIOGRAPHY CHEST WITH CONTRAST TECHNIQUE: Multidetector CT imaging of the chest was performed using the standard protocol during bolus administration of intravenous contrast. Multiplanar CT image reconstructions and MIPs were obtained to evaluate the vascular anatomy. CONTRAST:  100mL OMNIPAQUE IOHEXOL 350 MG/ML SOLN COMPARISON:  Chest x-ray today. FINDINGS: Cardiovascular: No filling defects in the pulmonary arteries to suggest pulmonary emboli. Mild cardiomegaly. Coronary artery and aortic atherosclerosis. No aneurysm. Mediastinum/Nodes: No mediastinal, hilar, or axillary adenopathy. Lungs/Pleura: Small bilateral pleural effusions. Airspace disease in the right middle lobe and right lower lobe concerning for pneumonia. Left base atelectasis. Moderate emphysema. Upper Abdomen: Imaging into the upper abdomen shows no acute findings. Musculoskeletal: Chest wall soft tissues are unremarkable. No acute bony abnormality. Review of the MIP images confirms the above findings. IMPRESSION: No evidence of pulmonary embolus. Small bilateral pleural effusions. Consolidation in the right middle lobe and right lower lobe concerning for pneumonia. Coronary artery disease. Aortic Atherosclerosis (ICD10-I70.0) and Emphysema (ICD10-J43.9). Electronically Signed   By: Charlett NoseKevin  Dover M.D.   On: 05/09/2019 00:49   Xr Chest Portable  Result Date: 05/08/2019 CLINICAL DATA:  78 year old female with COVID-19. Shortness of breath, hypoxia. EXAM: PORTABLE CHEST 1 VIEW COMPARISON:  Portable chest 05/01/2019 and earlier. FINDINGS: Portable AP semi upright view at 1935 hours. Moderate opacification of the right mid and lower lung. Increasing patchy left lung base opacity. Only the left upper lobe appears spared currently. Stable cardiac size and mediastinal contours. No pneumothorax. Difficult to exclude pleural effusions. Paucity of bowel gas. No acute osseous abnormality identified. IMPRESSION: Increasing lower lung predominant airspace  opacity greater on the right compatible with progressive COVID-19 pneumonia. Pleural effusions difficult to exclude. Electronically Signed   By: Odessa FlemingH  Hall M.D.   On: 05/08/2019 19:48        Scheduled Meds: . apixaban  5 mg Oral BID  . citalopram  40 mg Oral Daily  . dexamethasone  6 mg Oral Daily  . furosemide  20 mg Oral Daily  . gabapentin  200 mg Oral BID  . insulin aspart  0-5 Units Subcutaneous QHS  . insulin aspart  0-9 Units Subcutaneous TID WC  . levothyroxine  125 mcg Oral Q0600  . metoprolol tartrate  12.5 mg Oral BID  . mometasone-formoterol  2 puff Inhalation BID  . rosuvastatin  40 mg Oral QPM  . sodium chloride flush  3 mL Intravenous Q12H  . sodium chloride flush  3 mL Intravenous Q12H  .  umeclidinium bromide  1 puff Inhalation Daily   Continuous Infusions: . sodium chloride    . ceFEPime (MAXIPIME) IV 2 g (05/10/19 0558)  . vancomycin 1,750 mg (05/10/19 0137)     LOS: 1 day     Huey Bienenstock, MD [facility]  If 7PM-7AM, please contact night-coverage [email or web address] Password [email or web address] 05/10/2019, 11:46 AM

## 2019-05-10 NOTE — Progress Notes (Signed)
This RN and patient spoke to sister Izora Gala via speakerphone for update- no further questions.

## 2019-05-11 DIAGNOSIS — R531 Weakness: Secondary | ICD-10-CM

## 2019-05-11 LAB — COMPREHENSIVE METABOLIC PANEL
ALT: 15 U/L (ref 0–44)
AST: 12 U/L — ABNORMAL LOW (ref 15–41)
Albumin: 2.5 g/dL — ABNORMAL LOW (ref 3.5–5.0)
Alkaline Phosphatase: 51 U/L (ref 38–126)
Anion gap: 10 (ref 5–15)
BUN: 24 mg/dL — ABNORMAL HIGH (ref 8–23)
CO2: 43 mmol/L — ABNORMAL HIGH (ref 22–32)
Calcium: 8.7 mg/dL — ABNORMAL LOW (ref 8.9–10.3)
Chloride: 88 mmol/L — ABNORMAL LOW (ref 98–111)
Creatinine, Ser: 0.88 mg/dL (ref 0.44–1.00)
GFR calc Af Amer: >60 mL/min (ref >60)
GFR calc non Af Amer: >60 mL/min (ref >60)
Glucose, Bld: 239 mg/dL — ABNORMAL HIGH (ref 70–99)
Potassium: 3.8 mmol/L (ref 3.5–5.1)
Sodium: 141 mmol/L (ref 135–145)
Total Bilirubin: 0.5 mg/dL (ref 0.3–1.2)
Total Protein: 5.3 g/dL — ABNORMAL LOW (ref 6.5–8.1)

## 2019-05-11 LAB — CBC WITH DIFFERENTIAL/PLATELET
Abs Immature Granulocytes: 0.28 10*3/uL — ABNORMAL HIGH (ref 0.00–0.07)
Basophils Absolute: 0 10*3/uL (ref 0.0–0.1)
Basophils Relative: 0 %
Eosinophils Absolute: 0 10*3/uL (ref 0.0–0.5)
Eosinophils Relative: 0 %
HCT: 30.5 % — ABNORMAL LOW (ref 36.0–46.0)
Hemoglobin: 8 g/dL — ABNORMAL LOW (ref 12.0–15.0)
Immature Granulocytes: 4 %
Lymphocytes Relative: 15 %
Lymphs Abs: 1.1 10*3/uL (ref 0.7–4.0)
MCH: 23 pg — ABNORMAL LOW (ref 26.0–34.0)
MCHC: 26.2 g/dL — ABNORMAL LOW (ref 30.0–36.0)
MCV: 87.6 fL (ref 80.0–100.0)
Monocytes Absolute: 0.5 10*3/uL (ref 0.1–1.0)
Monocytes Relative: 7 %
Neutro Abs: 5.3 10*3/uL (ref 1.7–7.7)
Neutrophils Relative %: 74 %
Platelets: 359 10*3/uL (ref 150–400)
RBC: 3.48 MIL/uL — ABNORMAL LOW (ref 3.87–5.11)
RDW: 16.1 % — ABNORMAL HIGH (ref 11.5–15.5)
WBC: 7.2 10*3/uL (ref 4.0–10.5)
nRBC: 0 % (ref 0.0–0.2)

## 2019-05-11 LAB — C-REACTIVE PROTEIN: CRP: 1 mg/dL — ABNORMAL HIGH (ref <1.0)

## 2019-05-11 LAB — BRAIN NATRIURETIC PEPTIDE: B Natriuretic Peptide: 136.9 pg/mL — ABNORMAL HIGH (ref 0.0–100.0)

## 2019-05-11 LAB — D-DIMER, QUANTITATIVE: D-Dimer, Quant: 0.39 ug/mL-FEU (ref 0.00–0.50)

## 2019-05-11 LAB — PROCALCITONIN: Procalcitonin: <0.1 ng/mL

## 2019-05-11 LAB — GLUCOSE, CAPILLARY
Glucose-Capillary: 171 mg/dL — ABNORMAL HIGH (ref 70–99)
Glucose-Capillary: 399 mg/dL — ABNORMAL HIGH (ref 70–99)
Glucose-Capillary: 337 mg/dL — ABNORMAL HIGH (ref 70–99)

## 2019-05-11 LAB — MAGNESIUM: Magnesium: 2 mg/dL (ref 1.7–2.4)

## 2019-05-11 NOTE — Progress Notes (Signed)
  Speech Language Pathology Treatment: Dysphagia  Patient Details Name: Sheila Hayes MRN: [id number] DOB: 10/30/1940 Today's Date: 05/11/2019 Time: 7972-8206 SLP Time Calculation (min) (ACUTE ONLY): 15 min  Assessment / Plan / Recommendation Clinical Impression  F/u after yesterday's MBS.  Assisted with set-up for breakfast meal.  Pt alert, communicative and interactive. Able to feed herself appropriately.  Demonstrated excellent appetite.  There are no further concerns for aspiration - adequate oral attention, swift swallow noted, no coughing. Continue dysphagia 3, thin liquids due to absence of dentition.  Please ensure bed is upright and tray is prepared prior to leaving pt alone with meal.  No further SLP f/u warranted - we will sign off.    HPI HPI: 78 y.o. female with medical history significant for Covid infection diagnosed on 4 November, diastolic CHF, COPD, diabetes, HLD, HTN, hypothyroidism, obesity, pulmonary hypertension, MRSA bacteremia, dementia.  Hospitalized at [facility] from 11/9-16, D/Cd to [facility], then presented to ED later that evening with worsening shortness of breath, increasing O2 requirements.  Pt underwent bedside swallow evaluation on 11/10 - mechanical soft/thin liquids were recommended due to lack of dentition; otherwise, swallow was functional and no SLP f/u was needed.       SLP Plan  All goals met       Recommendations  Diet recommendations: Dysphagia 3 (mechanical soft);Thin liquid Liquids provided via: Cup;Straw Medication Administration: Whole meds with liquid Supervision: Patient able to self feed Postural Changes and/or Swallow Maneuvers: Seated upright 90 degrees                Oral Care Recommendations: Oral care BID Follow up Recommendations: Skilled Nursing facility SLP Visit Diagnosis: Dysphagia, unspecified (R13.10) Plan: All goals met       GO                Sheila Hayes 05/11/2019, 9:46  AM  Estill Bamberg L. Tivis Ringer, [place] Office number [phone] Pager [phone]

## 2019-05-11 NOTE — Progress Notes (Signed)
Patient ID: Sheila Hayes, female   DOB: 12-02-1940, 78 y.o.   MRN: [id number]  This NP spoke to patient's main decision maker /niece named Sheila Hayes, bedside RN and licensed clinical social worker as a follow up to  Sheila Hayes.  Continued conversation regarding current medical situation and GOCs.  We discussed diagnosis, prognosis, treatment option decisions, advanced directive decisions and anticipatory care needs.  Family verbalized their understanding of the patient's continued physical, functional and cognitive decline over the last many months.  Prior to previous admission to hospital patient was living at [facility] with hospice services in place.  Family recognize and accept the limited prognosis.  Their main hope for Sheila Hayes is comfort and dignity.  Today I discussed the elements of the MOST form with Sheila Hayes of care: -DNR/DNI -Transition to skilled nursing facility for short-term rehab with palliative/needs facility excepting COVID-19 patients, family is hoping for return to [facility] is a priority -Avoid rehospitalization -Determine use or limitations of antibiotics if and when infection occurs and even at that time only to use oral medications -No artificial feeding or hydration now or in the future -Community based palliative services on discharge at facility and convert to hospice when eligible/complete MOST form with family at earliest convenience.  Questions and concerns addressed   Emotional support offered  Discussed with LCSW/Sheila Hayes and Sheila Shanks RN/  hospital liaison with the [employer].    Discussed with Sheila Hayes   Total time spent on the unit was 45 minutes  Greater than 50% of the time was spent in counseling and coordination of care  Sheila Lessen NP  Palliative Medicine Team Team Phone # [phone] Pager [phone]

## 2019-05-11 NOTE — Progress Notes (Signed)
Inpatient Diabetes Program Recommendations  AACE/ADA: New Consensus Statement on Inpatient Glycemic Control (2015)  Target Ranges:  Prepandial:   less than 140 mg/dL      Peak postprandial:   less than 180 mg/dL (1-2 hours)      Critically ill patients:  140 - 180 mg/dL   Lab Results  Component Value Date   GLUCAP 171 (H) 05/11/2019   HGBA1C 5.4 05/02/2019    Review of Glycemic Control Results for Sheila Hayes, Sheila Hayes (MRN [id number]) as of 05/11/2019 08:58  Ref. Range 05/10/2019 07:52 05/10/2019 11:48 05/10/2019 15:52 05/10/2019 20:00 05/11/2019 07:22  Glucose-Capillary Latest Ref Range: 70 - 99 mg/dL 139 (H) 302 (H) 328 (H) 410 (H) 171 (H)   Diabetes history: DM 2 Outpatient Diabetes medications: Glipizide 10 QD + Metformin 1000 BID Current orders for Inpatient glycemic control:  Novolog 0-9 units tid + hs scale  Inpatient Diabetes Program Recommendations:    Solumderol 40 mg Daily, postprandial glucose increased into 400 range yesterday due to steroids.   If in plan of care for pt, consider Novolog 4 units tid if pt eating >50% of meals.  Thanks,  Tama Headings RN, MSN, BC-ADM Inpatient Diabetes Coordinator Team Pager [phone] (8a-5p)

## 2019-05-11 NOTE — Progress Notes (Signed)
PROGRESS NOTE    Sheila Hayes  GEX:[id number] DOB: Mar 25, 1941 DOA: 05/08/2019 PCP: [facility]   Brief Narrative:  Sheila Hayes is a 78 y.o. female with medical history significant of Covid infection diagnosed on 4 November, diastolic CHF, COPD, diabetes, HLD, HTN, hypothyroidism, obesity, pulmonary hypertension, MRSA bacteremia, dementia. Presented with   worsening shortness of breath was discharged yesterday after prolonged hospital stay for Covid.  Have had increased oxygen requirement today since her discharge has been of 3 to 4 L. She was treated with Remdesivir and steroids. During hospital stay she developed a.fib easily rate-controlled with beta blocker and started on lovenox for stroke risk reduction. This was later converted to Eliquis.  Subjective: She reports dyspnea, reports her appetite has improved, denies any chest pain .  Assessment & Plan:   Active Problems:   COPD exacerbation (HCC)   HTN (hypertension)   Diabetes (HCC)   HLD (hyperlipidemia)   Chronic diastolic CHF (congestive heart failure) (HCC)   Hypothyroidism   Acute on chronic respiratory failure with hypoxia and hypercapnia (HCC)   Pneumonia due to COVID-19 virus   Palliative care by specialist   DNR (do not resuscitate)   COVID-19 virus infection   Acute on chronic hypoxic respiratory failure. -Appears to be multifactorial, in the setting of COPD exacerbation, recent COVID-19 pneumonia, and likely aspiration pneumonia. -Acute PE is ruled out. -Continue with IV Unasyn for aspiration pneumonia, have started vancomycin and Zosyn . PICC line IV vancomycin and Zosyn, this will be changed to Unasyn to cover for aspiration  -Decadron, currently on IV Solu-Medrol to cover for COPD exacerbation. -He already finished IV remdesivir course at [facility] during recent hospitalization, no indication to repeat. -CT remarkable for right middle lobe infiltrate with negative procalcitonin, highly  suggestive of acute aspiration versus pneumonitis; less likely bacterial pneumonia -Certainly could be post viral inflammatory findings although would suspect infiltrate to be less localized -Continue Symbicort/Spiriva, albuterol HFA; hold off on nebs given recent COVID-19 positive -Follow daily procalcitonin, inflammatory markers, CRP and D-dimers within normal limit which is reassuring -COVID-19 given initial positive swab 05/02/2019 -As with staff, she was encouraged to ambulate, use incentive spirometry and flutter valve.  COVID-19 Labs  Recent Labs    05/09/19 0001 05/10/19 0315 05/11/19 0137  DDIMER  --  0.38 0.39  FERRITIN 16  --   --   LDH 291*  --   --   CRP 1.5* 1.0* 1.0*    A. fib, rate controlled -Heart rate controlled on metoprolol -Continue anticoagulation with Eliquis  Hypertension, essential -Continue home medications, currently well controlled  Heart failure, diastolic, not in acute exacerbation -Appears euvolemic, continue Lasix -Continue home medications  Hypothyroidism  -Continue home Synthroid  Diabetes, hold, non-insulin-dependent -Continue to hold p.o. medications, continue sliding scale insulin -Continue Lantus 8 given patient will complete steroid taper tomorrow  DVT prophylaxis: Eliquis 5 twice daily Code Status: DNR Family Communication: Disussed with patient.   Consult: Palliative medicine Disposition Plan: Inpatient[facility], disposition pending clinical improvement in hypoxia  Objective: Vitals:   05/11/19 0708 05/11/19 0738 05/11/19 0917 05/11/19 1200  BP:  117/88 110/73 (!) 115/59  Pulse:  (!) 54 60 65  Resp:  19 19 20   Temp:  98.9 F (37.2 C)  99.3 F (37.4 C)  TempSrc:  Oral  Oral  SpO2: 95% 100% 97% 91%    Intake/Output Summary (Last 24 hours) at 05/11/2019 1436 Last data filed at 05/11/2019 0500 Gross per  24 hour  Intake --  Output 400 ml  Net -400 ml   There were no vitals filed for this  visit.  Examination:  Awake Alert, Oriented X 3, No new F.N deficits, Normal affect, frail Symmetrical Chest wall movement, managed air entry at the bases with some rales. RRR,No Gallops,Rubs or new Murmurs, No Parasternal Heave +ve B.Sounds, Abd Soft, No tenderness, No rebound - guarding or rigidity. No Cyanosis, Clubbing or edema, No new Rash or bruise       Data Reviewed: I have personally reviewed following labs and imaging studies  CBC: Recent Labs  Lab 05/05/19 0301 05/06/19 0115 05/08/19 1949 05/10/19 0315 05/11/19 0137  WBC 3.7* 4.1 9.5 7.2 7.2  NEUTROABS 2.8  --   --  5.3 5.3  HGB 8.5* 8.5* 9.5* 8.2* 8.0*  HCT 31.5* 31.8* 34.6* 31.2* 30.5*  MCV 88.2 86.6 85.9 89.1 87.6  PLT 279 319 409* 349 359   Basic Metabolic Panel: Recent Labs  Lab 05/05/19 0301 05/06/19 0115 05/08/19 1949 05/10/19 0315 05/11/19 0137  NA 145 143 142 142 141  K 3.9 3.9 3.8 3.8 3.8  CL 90* 88* 86* 89* 88*  CO2 46* 46* 46* 47* 43*  GLUCOSE 198* 255* 216* 198* 239*  BUN 24* 28* 30* 29* 24*  CREATININE 0.80 0.75 0.81 0.83 0.88  CALCIUM 8.9 9.0 9.5 8.5* 8.7*  MG 1.8 2.1  --  1.9 2.0   GFR: Estimated Creatinine Clearance: 63.6 mL/min (by C-G formula based on SCr of 0.88 mg/dL). Liver Function Tests: Recent Labs  Lab 05/05/19 0301 05/06/19 0115 05/10/19 0315 05/11/19 0137  AST 15 18 15  12*  ALT 15 16 13 15   ALKPHOS 43 46 42 51  BILITOT 0.2* 0.4 0.6 0.5  PROT 5.8* 5.7* 5.0* 5.3*  ALBUMIN 2.4* 2.5* 2.3* 2.5*   CBG: Recent Labs  Lab 05/10/19 1148 05/10/19 1552 05/10/19 2000 05/11/19 0722 05/11/19 1200  GLUCAP 302* 328* 410* 171* 337*   Anemia Panel: Recent Labs    05/09/19 0001  FERRITIN 16   Sepsis Labs: Recent Labs  Lab 05/09/19 0001 05/10/19 0315 05/11/19 0137  PROCALCITON <0.10 <0.10 <0.10    No results found for this or any previous visit (from the past 240 hour(s)).       Radiology Studies: Dg Swallowing Func-speech Pathology  Result Date:  05/10/2019 Objective Swallowing Evaluation: Type of Study: MBS-Modified Barium Swallow Study  Patient Details Name: Sheila Hayes MRN: 05/12/2019 Date of Birth: [birth year] Today's Date: 05/10/2019 Time: SLP Start Time (ACUTE ONLY): 1455 -SLP Stop Time (ACUTE ONLY): 1515 SLP Time Calculation (min) (ACUTE ONLY): 20 min Past Medical History: Past Medical History: Diagnosis Date  Chronic diastolic congestive heart failure (HCC)   COPD (chronic obstructive pulmonary disease) (HCC)   Diabetes mellitus without complication (HCC)   Hyperlipidemia   Hypertension   Hypothyroidism   Morbid obesity (HCC)   Pulmonary hypertension (HCC)  Past Surgical History: Past Surgical History: Procedure Laterality Date  APPENDECTOMY    CHOLECYSTECTOMY   HPI: 78 y.o. female with medical history significant for Covid infection diagnosed on 4 November, diastolic CHF, COPD, diabetes, HLD, HTN, hypothyroidism, obesity, pulmonary hypertension, MRSA bacteremia, dementia.  Hospitalized at [facility] from 11/9-16, D/Cd to [facility], then presented to ED later that evening with worsening shortness of breath, increasing O2 requirements.  Pt underwent bedside swallow evaluation on 11/10 - mechanical soft/thin liquids were recommended due to lack of dentition; otherwise, swallow was functional and no SLP f/u  was needed.  Subjective: lethargic Assessment / Plan / Recommendation CHL IP CLINICAL IMPRESSIONS 05/10/2019 Clinical Impression Pt demonstrates normal swallow function for age. There were consistent episodes of trace flash penetration, but otherwise swallowing musculature was strong and pt response to PO was timely. Esophageal sweep appeared WNL, no radiologist present to confirm.  Episodic aspiration in the elderly, particularly when deconditioned, are of course possible. Pt should be assisted with meal set up and upright posture but there is no evidence in this test to support modified diet or PO restrictions. Pt to resume a  mechanical soft/thin liquid diet.  SLP will f/u x1 at bedside to ensure best practive with meals.  SLP Visit Diagnosis Dysphagia, unspecified (R13.10) Attention and concentration deficit following -- Frontal lobe and executive function deficit following -- Impact on safety and function Mild aspiration risk   CHL IP TREATMENT RECOMMENDATION 05/10/2019 Treatment Recommendations Therapy as outlined in treatment plan below   Prognosis 05/10/2019 Prognosis for Safe Diet Advancement Good Barriers to Reach Goals -- Barriers/Prognosis Comment -- CHL IP DIET RECOMMENDATION 05/10/2019 SLP Diet Recommendations Dysphagia 3 (Mech soft) solids;Thin liquid Liquid Administration via Cup;Straw Medication Administration Whole meds with liquid Compensations Slow rate;Small sips/bites Postural Changes Remain semi-upright after after feeds/meals (Comment);Seated upright at 90 degrees   CHL IP OTHER RECOMMENDATIONS 05/10/2019 Recommended Consults -- Oral Care Recommendations Oral care BID Other Recommendations --   CHL IP FOLLOW UP RECOMMENDATIONS 05/10/2019 Follow up Recommendations Skilled Nursing facility   [facility] IP FREQUENCY AND DURATION 05/10/2019 Speech Therapy Frequency (ACUTE ONLY) min 1 x/week Treatment Duration 2 weeks      CHL IP ORAL PHASE 05/10/2019 Oral Phase WFL Oral - Pudding Teaspoon -- Oral - Pudding Cup -- Oral - Honey Teaspoon -- Oral - Honey Cup -- Oral - Nectar Teaspoon -- Oral - Nectar Cup -- Oral - Nectar Straw -- Oral - Thin Teaspoon -- Oral - Thin Cup -- Oral - Thin Straw -- Oral - Puree -- Oral - Mech Soft -- Oral - Regular -- Oral - Multi-Consistency -- Oral - Pill -- Oral Phase - Comment --  CHL IP PHARYNGEAL PHASE 05/10/2019 Pharyngeal Phase WFL Pharyngeal- Pudding Teaspoon -- Pharyngeal -- Pharyngeal- Pudding Cup -- Pharyngeal -- Pharyngeal- Honey Teaspoon -- Pharyngeal -- Pharyngeal- Honey Cup -- Pharyngeal -- Pharyngeal- Nectar Teaspoon -- Pharyngeal -- Pharyngeal- Nectar Cup -- Pharyngeal -- Pharyngeal-  Nectar Straw -- Pharyngeal -- Pharyngeal- Thin Teaspoon -- Pharyngeal -- Pharyngeal- Thin Cup -- Pharyngeal -- Pharyngeal- Thin Straw -- Pharyngeal -- Pharyngeal- Puree -- Pharyngeal -- Pharyngeal- Mechanical Soft -- Pharyngeal -- Pharyngeal- Regular -- Pharyngeal -- Pharyngeal- Multi-consistency -- Pharyngeal -- Pharyngeal- Pill -- Pharyngeal -- Pharyngeal Comment --  No flowsheet data found. Herbie Baltimore, MA CCC-SLP Acute Rehabilitation Services Pager [phone] Office [phone] Lynann Beaver 05/10/2019, 3:29 PM                   Scheduled Meds:  apixaban  5 mg Oral BID   citalopram  20 mg Oral Daily   furosemide  20 mg Oral Daily   gabapentin  200 mg Oral BID   guaiFENesin  1,200 mg Oral BID   insulin aspart  0-5 Units Subcutaneous QHS   insulin aspart  0-9 Units Subcutaneous TID WC   levothyroxine  125 mcg Oral Q0600   methylPREDNISolone (SOLU-MEDROL) injection  40 mg Intravenous Daily   metoprolol tartrate  12.5 mg Oral BID   mometasone-formoterol  2 puff Inhalation BID   rosuvastatin  40 mg  Oral QPM   sodium chloride flush  3 mL Intravenous Q12H   sodium chloride flush  3 mL Intravenous Q12H   umeclidinium bromide  1 puff Inhalation Daily   Continuous Infusions:  sodium chloride     ampicillin-sulbactam (UNASYN) IV 3 g (05/11/19 1211)     LOS: 2 days     Huey Bienenstock, MD [facility]  If 7PM-7AM, please contact night-coverage [email or web address] Password [email or web address] 05/11/2019, 2:36 PM

## 2019-05-12 LAB — GLUCOSE, CAPILLARY
Glucose-Capillary: 360 mg/dL — ABNORMAL HIGH (ref 70–99)
Glucose-Capillary: 210 mg/dL — ABNORMAL HIGH (ref 70–99)
Glucose-Capillary: 380 mg/dL — ABNORMAL HIGH (ref 70–99)
Glucose-Capillary: 147 mg/dL — ABNORMAL HIGH (ref 70–99)
Glucose-Capillary: 261 mg/dL — ABNORMAL HIGH (ref 70–99)
Glucose-Capillary: 411 mg/dL — ABNORMAL HIGH (ref 70–99)
Glucose-Capillary: 399 mg/dL — ABNORMAL HIGH (ref 70–99)

## 2019-05-12 LAB — COMPREHENSIVE METABOLIC PANEL
ALT: 16 U/L (ref 0–44)
AST: 18 U/L (ref 15–41)
Albumin: 2.4 g/dL — ABNORMAL LOW (ref 3.5–5.0)
Alkaline Phosphatase: 45 U/L (ref 38–126)
Anion gap: 10 (ref 5–15)
BUN: 22 mg/dL (ref 8–23)
CO2: 43 mmol/L — ABNORMAL HIGH (ref 22–32)
Calcium: 8.5 mg/dL — ABNORMAL LOW (ref 8.9–10.3)
Chloride: 89 mmol/L — ABNORMAL LOW (ref 98–111)
Creatinine, Ser: 0.78 mg/dL (ref 0.44–1.00)
GFR calc Af Amer: >60 mL/min (ref >60)
GFR calc non Af Amer: >60 mL/min (ref >60)
Glucose, Bld: 226 mg/dL — ABNORMAL HIGH (ref 70–99)
Potassium: 4.1 mmol/L (ref 3.5–5.1)
Sodium: 142 mmol/L (ref 135–145)
Total Bilirubin: 0.2 mg/dL — ABNORMAL LOW (ref 0.3–1.2)
Total Protein: 5.2 g/dL — ABNORMAL LOW (ref 6.5–8.1)

## 2019-05-12 LAB — CBC WITH DIFFERENTIAL/PLATELET
Abs Immature Granulocytes: 0.14 10*3/uL — ABNORMAL HIGH (ref 0.00–0.07)
Basophils Absolute: 0 10*3/uL (ref 0.0–0.1)
Basophils Relative: 0 %
Eosinophils Absolute: 0 10*3/uL (ref 0.0–0.5)
Eosinophils Relative: 0 %
HCT: 29.2 % — ABNORMAL LOW (ref 36.0–46.0)
Hemoglobin: 7.7 g/dL — ABNORMAL LOW (ref 12.0–15.0)
Immature Granulocytes: 2 %
Lymphocytes Relative: 14 %
Lymphs Abs: 1 10*3/uL (ref 0.7–4.0)
MCH: 23.2 pg — ABNORMAL LOW (ref 26.0–34.0)
MCHC: 26.4 g/dL — ABNORMAL LOW (ref 30.0–36.0)
MCV: 88 fL (ref 80.0–100.0)
Monocytes Absolute: 0.3 10*3/uL (ref 0.1–1.0)
Monocytes Relative: 4 %
Neutro Abs: 5.8 10*3/uL (ref 1.7–7.7)
Neutrophils Relative %: 80 %
Platelets: 335 10*3/uL (ref 150–400)
RBC: 3.32 MIL/uL — ABNORMAL LOW (ref 3.87–5.11)
RDW: 16.1 % — ABNORMAL HIGH (ref 11.5–15.5)
WBC: 7.3 10*3/uL (ref 4.0–10.5)
nRBC: 0 % (ref 0.0–0.2)

## 2019-05-12 LAB — BRAIN NATRIURETIC PEPTIDE: B Natriuretic Peptide: 136 pg/mL — ABNORMAL HIGH (ref 0.0–100.0)

## 2019-05-12 LAB — C-REACTIVE PROTEIN: CRP: <0.8 mg/dL (ref <1.0)

## 2019-05-12 LAB — MAGNESIUM: Magnesium: 1.9 mg/dL (ref 1.7–2.4)

## 2019-05-12 LAB — D-DIMER, QUANTITATIVE: D-Dimer, Quant: 0.27 ug/mL-FEU (ref 0.00–0.50)

## 2019-05-12 MED ORDER — INSULIN ASPART 100 UNIT/ML ~~LOC~~ SOLN
0.0000 [IU] | Freq: Three times a day (TID) | SUBCUTANEOUS | Status: DC
  Administered 2019-05-13: 2 [IU] via SUBCUTANEOUS
  Administered 2019-05-13: 13:00:00 15 [IU] via SUBCUTANEOUS
  Administered 2019-05-13: 20 [IU] via SUBCUTANEOUS
  Administered 2019-05-14: 5 [IU] via SUBCUTANEOUS
  Administered 2019-05-14: 2 [IU] via SUBCUTANEOUS

## 2019-05-12 MED ORDER — INSULIN ASPART 100 UNIT/ML ~~LOC~~ SOLN
20.0000 [IU] | Freq: Once | SUBCUTANEOUS | Status: AC
  Administered 2019-05-12: 20 [IU] via SUBCUTANEOUS

## 2019-05-12 MED ORDER — AMOXICILLIN-POT CLAVULANATE 875-125 MG PO TABS
1.0000 | ORAL_TABLET | Freq: Two times a day (BID) | ORAL | Status: DC
  Administered 2019-05-12 – 2019-05-14 (×4): 1 via ORAL
  Filled 2019-05-12 (×5): qty 1

## 2019-05-12 MED ORDER — BISACODYL 5 MG PO TBEC
10.0000 mg | DELAYED_RELEASE_TABLET | Freq: Once | ORAL | Status: AC
  Administered 2019-05-12: 18:00:00 10 mg via ORAL
  Filled 2019-05-12: qty 2

## 2019-05-12 MED ORDER — POLYETHYLENE GLYCOL 3350 17 G PO PACK
34.0000 g | PACK | Freq: Once | ORAL | Status: AC
  Administered 2019-05-12: 34 g via ORAL
  Filled 2019-05-12: qty 2

## 2019-05-12 NOTE — Progress Notes (Addendum)
PROGRESS NOTE    Sheila Hayes  ZOX:[id number] DOB: 1940-08-03 DOA: 05/08/2019 PCP: Almetta Lovely, [facility]   Brief Narrative:  Sheila Hayes is a 78 y.o. female with medical history significant of Covid infection diagnosed on 4 November, diastolic CHF, COPD, diabetes, HLD, HTN, hypothyroidism, obesity, pulmonary hypertension, MRSA bacteremia, dementia. Presented with   worsening shortness of breath was discharged yesterday after prolonged hospital stay for Covid.  Have had increased oxygen requirement today since her discharge has been of 3 to 4 L. She was treated with Remdesivir and steroids. During hospital stay she developed a.fib easily rate-controlled with beta blocker and started on lovenox for stroke risk reduction. This was later converted to Eliquis.  Subjective: Reports some dyspnea, generalized weakness, but reports appetite is good, she had a good night sleep, denies any chest pain.,  She reports some constipation, no bowel movement since admission.  Assessment & Plan:   Active Problems:   COPD exacerbation (HCC)   HTN (hypertension)   Diabetes (HCC)   HLD (hyperlipidemia)   Chronic diastolic CHF (congestive heart failure) (HCC)   Hypothyroidism   Acute on chronic respiratory failure with hypoxia and hypercapnia (HCC)   Pneumonia due to COVID-19 virus   Palliative care by specialist   DNR (do not resuscitate)   COVID-19 virus infection   Weakness generalized   Acute on chronic hypoxic respiratory failure. -Appears to be multifactorial, in the setting of COPD exacerbation, recent COVID-19 pneumonia, and likely aspiration pneumonia. -Acute PE is ruled out. -Continue with IV Unasyn for aspiration pneumonia, in addition to Augmentin, will aim for total of 7 days treatment. -Decadron, currently on IV Solu-Medrol to cover for COPD exacerbation. -He already finished IV remdesivir course at [facility] during recent hospitalization, no indication to repeat. -CT  remarkable for right middle lobe infiltrate with negative procalcitonin, highly suggestive of acute aspiration versus pneumonitis; less likely bacterial pneumonia -Certainly could be post viral inflammatory findings although would suspect infiltrate to be less localized -Continue Symbicort/Spiriva, albuterol HFA; hold off on nebs given recent COVID-19 positive -Follow daily procalcitonin, inflammatory markers, CRP and D-dimers within normal limit which is reassuring -COVID-19 given initial positive swab 05/02/2019 -As with staff, she was encouraged to ambulate, use incentive spirometry and flutter valve.  COVID-19 Labs  Recent Labs    05/10/19 0315 05/11/19 0137 05/12/19 0010 05/12/19 0011  DDIMER 0.38 0.39 0.27  --   CRP 1.0* 1.0*  --  <0.8    A. fib, rate controlled -Heart rate controlled on metoprolol -Continue anticoagulation with Eliquis  Hypertension, essential -Continue home medications, currently well controlled  Heart failure, diastolic, not in acute exacerbation -Appears euvolemic, continue Lasix -Continue home medications  Hypothyroidism  -Continue home Synthroid  Diabetes, hold, non-insulin-dependent -Continue to hold p.o. medications, continue sliding scale insulin -Continue Lantus   Constipation -We will start on laxatives  DVT prophylaxis: Eliquis 5 twice daily Code Status: DNR Family Communication: Disussed with patient.   Consult: Palliative medicine Disposition Plan: SNF in 1 to 2 days  Objective: Vitals:   05/12/19 0355 05/12/19 0630 05/12/19 0820 05/12/19 1212  BP: 105/62  (!) 123/51 (!) 97/57  Pulse: (!) 58  (!) 56 60  Resp: (!) 23 20    Temp: 98.4 F (36.9 C)  98.4 F (36.9 C) 98.3 F (36.8 C)  TempSrc: Oral  Oral Oral  SpO2: 94%  (!) 88% (!) 87%    Intake/Output Summary (Last 24 hours) at 05/12/2019 1300 Last data filed at 05/12/2019 [phone]  Gross per 24 hour  Intake 320 ml  Output --  Net 320 ml   There were no vitals filed for  this visit.  Examination:  Awake Alert, Oriented X 3, No new F.N deficits, Normal affect, extremely frail Symmetrical Chest wall movement, diminished air entry at the bases with some rails RRR,No Gallops,Rubs or new Murmurs, No Parasternal Heave +ve B.Sounds, Abd Soft, No tenderness, No rebound - guarding or rigidity. No Cyanosis, Clubbing or edema, No new Rash or bruise        Data Reviewed: I have personally reviewed following labs and imaging studies  CBC: Recent Labs  Lab 05/06/19 0115 05/08/19 1949 05/10/19 0315 05/11/19 0137 05/12/19 0010  WBC 4.1 9.5 7.2 7.2 7.3  NEUTROABS  --   --  5.3 5.3 5.8  HGB 8.5* 9.5* 8.2* 8.0* 7.7*  HCT 31.8* 34.6* 31.2* 30.5* 29.2*  MCV 86.6 85.9 89.1 87.6 88.0  PLT 319 409* 349 359 335   Basic Metabolic Panel: Recent Labs  Lab 05/06/19 0115 05/08/19 1949 05/10/19 0315 05/11/19 0137 05/12/19 0010  NA 143 142 142 141 142  K 3.9 3.8 3.8 3.8 4.1  CL 88* 86* 89* 88* 89*  CO2 46* 46* 47* 43* 43*  GLUCOSE 255* 216* 198* 239* 226*  BUN 28* 30* 29* 24* 22  CREATININE 0.75 0.81 0.83 0.88 0.78  CALCIUM 9.0 9.5 8.5* 8.7* 8.5*  MG 2.1  --  1.9 2.0 1.9   GFR: Estimated Creatinine Clearance: 70 mL/min (by C-G formula based on SCr of 0.78 mg/dL). Liver Function Tests: Recent Labs  Lab 05/06/19 0115 05/10/19 0315 05/11/19 0137 05/12/19 0010  AST 18 15 12* 18  ALT 16 13 15 16   ALKPHOS 46 42 51 45  BILITOT 0.4 0.6 0.5 0.2*  PROT 5.7* 5.0* 5.3* 5.2*  ALBUMIN 2.5* 2.3* 2.5* 2.4*   CBG: Recent Labs  Lab 05/11/19 1550 05/11/19 2023 05/12/19 0740 05/12/19 0847 05/12/19 1201  GLUCAP 360* 210* 380* 147* 261*   Anemia Panel: No results for input(s): VITAMINB12, FOLATE, FERRITIN, TIBC, IRON, RETICCTPCT in the last 72 hours. Sepsis Labs: Recent Labs  Lab 05/09/19 0001 05/10/19 0315 05/11/19 0137  PROCALCITON <0.10 <0.10 <0.10    No results found for this or any previous visit (from the past 240 hour(s)).       Radiology  Studies: Dg Swallowing Func-speech Pathology  Result Date: 05/10/2019 Objective Swallowing Evaluation: Type of Study: MBS-Modified Barium Swallow Study  Patient Details Name: Sheila Hayes MRN: [id number] Date of Birth: [birth year] Today's Date: 05/10/2019 Time: SLP Start Time (ACUTE ONLY): 1455 -SLP Stop Time (ACUTE ONLY): 1515 SLP Time Calculation (min) (ACUTE ONLY): 20 min Past Medical History: Past Medical History: Diagnosis Date  Chronic diastolic congestive heart failure (HCC)   COPD (chronic obstructive pulmonary disease) (HCC)   Diabetes mellitus without complication (HCC)   Hyperlipidemia   Hypertension   Hypothyroidism   Morbid obesity (HCC)   Pulmonary hypertension (HCC)  Past Surgical History: Past Surgical History: Procedure Laterality Date  APPENDECTOMY    CHOLECYSTECTOMY   HPI: 78 y.o. female with medical history significant for Covid infection diagnosed on 4 November, diastolic CHF, COPD, diabetes, HLD, HTN, hypothyroidism, obesity, pulmonary hypertension, MRSA bacteremia, dementia.  [facility], [facility], then presented to ED later that evening with worsening shortness of breath, increasing O2 requirements.  Pt underwent bedside swallow evaluation on 11/10 - mechanical soft/thin liquids were recommended due to lack of dentition; otherwise,  swallow was functional and no SLP f/u was needed.  Subjective: lethargic Assessment / Plan / Recommendation CHL IP CLINICAL IMPRESSIONS 05/10/2019 Clinical Impression Pt demonstrates normal swallow function for age. There were consistent episodes of trace flash penetration, but otherwise swallowing musculature was strong and pt response to PO was timely. Esophageal sweep appeared WNL, no radiologist present to confirm.  Episodic aspiration in the elderly, particularly when deconditioned, are of course possible. Pt should be assisted with meal set up and upright posture but there is no evidence in this  test to support modified diet or PO restrictions. Pt to resume a mechanical soft/thin liquid diet.  SLP will f/u x1 at bedside to ensure best practive with meals.  SLP Visit Diagnosis Dysphagia, unspecified (R13.10) Attention and concentration deficit following -- Frontal lobe and executive function deficit following -- Impact on safety and function Mild aspiration risk   CHL IP TREATMENT RECOMMENDATION 05/10/2019 Treatment Recommendations Therapy as outlined in treatment plan below   Prognosis 05/10/2019 Prognosis for Safe Diet Advancement Good Barriers to Reach Goals -- Barriers/Prognosis Comment -- CHL IP DIET RECOMMENDATION 05/10/2019 SLP Diet Recommendations Dysphagia 3 (Mech soft) solids;Thin liquid Liquid Administration via Cup;Straw Medication Administration Whole meds with liquid Compensations Slow rate;Small sips/bites Postural Changes Remain semi-upright after after feeds/meals (Comment);Seated upright at 90 degrees   CHL IP OTHER RECOMMENDATIONS 05/10/2019 Recommended Consults -- Oral Care Recommendations Oral care BID Other Recommendations --   CHL IP FOLLOW UP RECOMMENDATIONS 05/10/2019 Follow up Recommendations Skilled Nursing facility   [facility] IP FREQUENCY AND DURATION 05/10/2019 Speech Therapy Frequency (ACUTE ONLY) min 1 x/week Treatment Duration 2 weeks      CHL IP ORAL PHASE 05/10/2019 Oral Phase WFL Oral - Pudding Teaspoon -- Oral - Pudding Cup -- Oral - Honey Teaspoon -- Oral - Honey Cup -- Oral - Nectar Teaspoon -- Oral - Nectar Cup -- Oral - Nectar Straw -- Oral - Thin Teaspoon -- Oral - Thin Cup -- Oral - Thin Straw -- Oral - Puree -- Oral - Mech Soft -- Oral - Regular -- Oral - Multi-Consistency -- Oral - Pill -- Oral Phase - Comment --  CHL IP PHARYNGEAL PHASE 05/10/2019 Pharyngeal Phase WFL Pharyngeal- Pudding Teaspoon -- Pharyngeal -- Pharyngeal- Pudding Cup -- Pharyngeal -- Pharyngeal- Honey Teaspoon -- Pharyngeal -- Pharyngeal- Honey Cup -- Pharyngeal -- Pharyngeal- Nectar Teaspoon --  Pharyngeal -- Pharyngeal- Nectar Cup -- Pharyngeal -- Pharyngeal- Nectar Straw -- Pharyngeal -- Pharyngeal- Thin Teaspoon -- Pharyngeal -- Pharyngeal- Thin Cup -- Pharyngeal -- Pharyngeal- Thin Straw -- Pharyngeal -- Pharyngeal- Puree -- Pharyngeal -- Pharyngeal- Mechanical Soft -- Pharyngeal -- Pharyngeal- Regular -- Pharyngeal -- Pharyngeal- Multi-consistency -- Pharyngeal -- Pharyngeal- Pill -- Pharyngeal -- Pharyngeal Comment --  No flowsheet data found. Harlon Ditty, MA CCC-SLP Acute Rehabilitation Services Pager [phone] Office [phone] Claudine Mouton 05/10/2019, 3:29 PM                   Scheduled Meds:  apixaban  5 mg Oral BID   citalopram  20 mg Oral Daily   furosemide  20 mg Oral Daily   gabapentin  200 mg Oral BID   guaiFENesin  1,200 mg Oral BID   insulin aspart  0-5 Units Subcutaneous QHS   insulin aspart  0-9 Units Subcutaneous TID WC   levothyroxine  125 mcg Oral Q0600   methylPREDNISolone (SOLU-MEDROL) injection  40 mg Intravenous Daily   metoprolol tartrate  12.5 mg Oral BID   mometasone-formoterol  2 puff Inhalation  BID   sodium chloride flush  3 mL Intravenous Q12H   sodium chloride flush  3 mL Intravenous Q12H   umeclidinium bromide  1 puff Inhalation Daily   Continuous Infusions:  sodium chloride     ampicillin-sulbactam (UNASYN) IV 3 g (05/12/19 1207)     LOS: 3 days     Phillips Climes, MD [facility]  If 7PM-7AM, please contact night-coverage [email or web address] Password [email or web address] 05/12/2019, 1:00 PM

## 2019-05-13 LAB — GLUCOSE, CAPILLARY
Glucose-Capillary: 121 mg/dL — ABNORMAL HIGH (ref 70–99)
Glucose-Capillary: 382 mg/dL — ABNORMAL HIGH (ref 70–99)
Glucose-Capillary: 422 mg/dL — ABNORMAL HIGH (ref 70–99)
Glucose-Capillary: 260 mg/dL — ABNORMAL HIGH (ref 70–99)

## 2019-05-13 MED ORDER — PREDNISONE 20 MG PO TABS
20.0000 mg | ORAL_TABLET | Freq: Every day | ORAL | Status: DC
  Administered 2019-05-14: 20 mg via ORAL
  Filled 2019-05-13: qty 1

## 2019-05-13 MED ORDER — PANTOPRAZOLE SODIUM 40 MG PO TBEC
40.0000 mg | DELAYED_RELEASE_TABLET | Freq: Every day | ORAL | Status: DC
  Administered 2019-05-13 – 2019-05-14 (×2): 40 mg via ORAL
  Filled 2019-05-13 (×2): qty 1

## 2019-05-13 NOTE — TOC Progression Note (Signed)
Transition of Care [facility]) - Progression Note    Patient Details  Name: Sheila Hayes MRN: [id number] Date of Birth: 04-01-1941  [facility]) CM/SW Contact  Ninfa Meeker, RN Phone Number: 05/12/19    Clinical Narrative:   Case manager spoke with Irine Seal at [facility] who states that patient can return on Sunday 05/13/19.          Expected Discharge Plan and Services                                                 Social Determinants of Health (SDOH) Interventions    Readmission Risk Interventions Readmission Risk Prevention Plan 09/04/2018 09/02/2018 02/28/2018  Transportation Screening Complete Complete Complete  PCP or Specialist Appt within 3-5 Days Complete Complete (No Data)  Home Care Screening - - Complete  HRI or Home Care Consult Complete Complete Complete  Social Work Consult for [place] Planning/Counseling Complete Not Complete Not Complete  SW consult not completed comments - NA -  Palliative Care Screening Complete Complete Not Complete  Medication Review Press photographer) Complete Complete Complete  Some recent data might be hidden

## 2019-05-13 NOTE — Progress Notes (Signed)
PROGRESS NOTE    Sheila Hayes  BTD:[id number] DOB: 1941/03/28 DOA: 05/08/2019 PCP: Orvis Brill, [facility]   Brief Narrative:  Sheila Hayes is a 78 y.o. female with medical history significant of Covid infection diagnosed on 4 November, diastolic CHF, COPD, diabetes, HLD, HTN, hypothyroidism, obesity, pulmonary hypertension, MRSA bacteremia, dementia. Presented with   worsening shortness of breath was discharged yesterday after prolonged hospital stay for Covid.  Have had increased oxygen requirement today since her discharge has been of 3 to 4 L. She was treated with Remdesivir and steroids. During hospital stay she developed a.fib easily rate-controlled with beta blocker and started on lovenox for stroke risk reduction. This was later converted to Eliquis.  Subjective:  Patient reports generalized weakness, reports she is having good appetite, she had a good night sleep, denies any chest pain, she still reports some constipation.   Assessment & Plan:   Active Problems:   COPD exacerbation (HCC)   HTN (hypertension)   Diabetes ([place])   HLD (hyperlipidemia)   Chronic diastolic CHF (congestive heart failure) (HCC)   Hypothyroidism   Acute on chronic respiratory failure with hypoxia and hypercapnia (HCC)   Pneumonia due to COVID-19 virus   Palliative care by specialist   DNR (do not resuscitate)   COVID-19 virus infection   Weakness generalized   Acute on chronic hypoxic respiratory failure. -Appears to be multifactorial, in the setting of COPD exacerbation, recent COVID-19 pneumonia, and likely aspiration pneumonia. -Acute PE is ruled out. -On IV Unasyn for aspiration pneumonia, currently on Augmentin, total for 7 days treatment . -Usually on IV Decadron, currently on IV Solu-Medrol, will transition to prednisone for COPD exacerbation -He already finished IV remdesivir course at [facility] during recent hospitalization, no indication to repeat. -CT remarkable for right  middle lobe infiltrate with negative procalcitonin, highly suggestive of acute aspiration versus pneumonitis; less likely bacterial pneumonia -Certainly could be post viral inflammatory findings although would suspect infiltrate to be less localized -Continue Symbicort/Spiriva, albuterol HFA; hold off on nebs given recent COVID-19 positive -Follow daily procalcitonin, inflammatory markers, CRP and D-dimers within normal limit which is reassuring -COVID-19 given initial positive swab 05/02/2019 -As with staff, she was encouraged to ambulate, use incentive spirometry and flutter valve.  COVID-19 Labs  Recent Labs    05/11/19 0137 05/12/19 0010 05/12/19 0011  DDIMER 0.39 0.27  --   CRP 1.0*  --  <0.8    A. fib, rate controlled -Heart rate controlled on metoprolol -Continue anticoagulation with Eliquis  Hypertension, essential -Continue home medications, currently well controlled  Heart failure, diastolic, not in acute exacerbation -Appears euvolemic, continue Lasix -Continue home medications  Hypothyroidism  -Continue home Synthroid  Diabetes, hold, non-insulin-dependent -Continue to hold p.o. medications, continue sliding scale insulin -Continue Lantus  -CBG is labile, between 400 and low 100s, correct hyperglycemia to avoid hypoglycemia, keep on current regimen.  Constipation -Continue with laxatives.  DVT prophylaxis: Eliquis 5 twice daily Code Status: DNR Family Communication: Disussed with niece.   Consult: Palliative medicine Disposition Plan: SNF when bed is available.  Objective: Vitals:   05/12/19 1920 05/12/19 2355 05/13/19 0435 05/13/19 0729  BP: 113/65 127/63 (!) 115/99 (!) 114/48  Pulse: 75 61 (!) 59 (!) 58  Resp: 20 20 20 20   Temp: 98.4 F (36.9 C) 98.9 F (37.2 C) 97.7 F (36.5 C) 98.3 F (36.8 C)  TempSrc: Oral Oral Oral Oral  SpO2: 95% 100% 99% 95%    Intake/Output Summary (Last 24 hours) at 05/13/2019  1117 Last data filed at 05/13/2019 1000  Gross per 24 hour  Intake 1040 ml  Output -  Net 1040 ml   There were no vitals filed for this visit.  Examination:  Awake Alert, Oriented X 2, No new F.N deficits, Normal affect, easily distracted, extremely frail Symmetrical Chest wall movement, Good air movement bilaterally, CTAB RRR,No Gallops,Rubs or new Murmurs, No Parasternal Heave +ve B.Sounds, Abd Soft, No tenderness, No rebound - guarding or rigidity. No Cyanosis, Clubbing or edema, No new Rash or bruise        Data Reviewed: I have personally reviewed following labs and imaging studies  CBC: Recent Labs  Lab 05/08/19 1949 05/10/19 0315 05/11/19 0137 05/12/19 0010  WBC 9.5 7.2 7.2 7.3  NEUTROABS  --  5.3 5.3 5.8  HGB 9.5* 8.2* 8.0* 7.7*  HCT 34.6* 31.2* 30.5* 29.2*  MCV 85.9 89.1 87.6 88.0  PLT 409* 349 359 335   Basic Metabolic Panel: Recent Labs  Lab 05/08/19 1949 05/10/19 0315 05/11/19 0137 05/12/19 0010  NA 142 142 141 142  K 3.8 3.8 3.8 4.1  CL 86* 89* 88* 89*  CO2 46* 47* 43* 43*  GLUCOSE 216* 198* 239* 226*  BUN 30* 29* 24* 22  CREATININE 0.81 0.83 0.88 0.78  CALCIUM 9.5 8.5* 8.7* 8.5*  MG  --  1.9 2.0 1.9   GFR: Estimated Creatinine Clearance: 70 mL/min (by C-G formula based on SCr of 0.78 mg/dL). Liver Function Tests: Recent Labs  Lab 05/10/19 0315 05/11/19 0137 05/12/19 0010  AST 15 12* 18  ALT 13 15 16   ALKPHOS 42 51 45  BILITOT 0.6 0.5 0.2*  PROT 5.0* 5.3* 5.2*  ALBUMIN 2.3* 2.5* 2.4*   CBG: Recent Labs  Lab 05/12/19 0847 05/12/19 1201 05/12/19 1729 05/12/19 1919 05/13/19 0725  GLUCAP 147* 261* 411* 399* 121*   Anemia Panel: No results for input(s): VITAMINB12, FOLATE, FERRITIN, TIBC, IRON, RETICCTPCT in the last 72 hours. Sepsis Labs: Recent Labs  Lab 05/09/19 0001 05/10/19 0315 05/11/19 0137  PROCALCITON <0.10 <0.10 <0.10    No results found for this or any previous visit (from the past 240 hour(s)).       Radiology Studies: No results found.       Scheduled Meds: . amoxicillin-clavulanate  1 tablet Oral Q12H  . apixaban  5 mg Oral BID  . citalopram  20 mg Oral Daily  . furosemide  20 mg Oral Daily  . gabapentin  200 mg Oral BID  . guaiFENesin  1,200 mg Oral BID  . insulin aspart  0-15 Units Subcutaneous TID WC  . insulin aspart  0-5 Units Subcutaneous QHS  . levothyroxine  125 mcg Oral Q0600  . methylPREDNISolone (SOLU-MEDROL) injection  40 mg Intravenous Daily  . metoprolol tartrate  12.5 mg Oral BID  . mometasone-formoterol  2 puff Inhalation BID  . sodium chloride flush  3 mL Intravenous Q12H  . sodium chloride flush  3 mL Intravenous Q12H  . umeclidinium bromide  1 puff Inhalation Daily   Continuous Infusions: . sodium chloride       LOS: 4 days     05/13/19, MD [facility]  If 7PM-7AM, please contact night-coverage [email or web address] Password [facility] 05/13/2019, 11:17 AM

## 2019-05-14 LAB — GLUCOSE, CAPILLARY
Glucose-Capillary: 134 mg/dL — ABNORMAL HIGH (ref 70–99)
Glucose-Capillary: 205 mg/dL — ABNORMAL HIGH (ref 70–99)

## 2019-05-14 MED ORDER — INSULIN ASPART 100 UNIT/ML ~~LOC~~ SOLN
5.0000 [IU] | Freq: Three times a day (TID) | SUBCUTANEOUS | Status: DC
  Administered 2019-05-14 (×2): 5 [IU] via SUBCUTANEOUS

## 2019-05-14 MED ORDER — ACETAMINOPHEN 325 MG PO TABS: 650.0000 mg | ORAL_TABLET | Freq: Four times a day (QID) | ORAL | Status: AC | PRN

## 2019-05-14 MED ORDER — LORAZEPAM 0.5 MG PO TABS: 0.5000 mg | ORAL_TABLET | Freq: Three times a day (TID) | ORAL | 0 refills | Status: DC | PRN

## 2019-05-14 MED ORDER — PREDNISONE 10 MG (21) PO TBPK: ORAL_TABLET | ORAL | 0 refills | Status: DC

## 2019-05-14 NOTE — Discharge Instructions (Signed)

## 2019-05-14 NOTE — TOC Transition Note (Signed)
]   Transition of Care [facility]) - CM/SW Discharge Note   Patient Details  Name: Sheila Hayes MRN: [id number] Date of Birth: [birth year]  [facility] CM/SW Contact:  Ninfa Meeker, RN Phone Number: [phone] (working remotely) 05/14/2019, 1:05 PM   Clinical Narrative:    Patient [facility] Anticipated DC date: 05/14/19 Family notified: Niece: Otila Kluver Armenta:[phone] Transport by: Corey Harold : next available   Patient is medically ready for discharge per MD. Bedside RN, Charge nurse, and facility aware of discharge plan. Discharge summary and FL2 sent via the HUB to [facility]. Bedside RN to call report to [phone], patient going to room 805A. Medical necessity printed to unit. Ambulance transport has been requested           Patient Goals and CMS Choice        Discharge Placement                       Discharge Plan and Services                                     Social Determinants of Health (SDOH) Interventions     Readmission Risk Interventions Readmission Risk Prevention Plan 09/04/2018 09/02/2018 02/28/2018  Transportation Screening Complete Complete Complete  PCP or Specialist Appt within 3-5 Days Complete Complete (No Data)  Home Care Screening - - Complete  HRI or Home Care Consult Complete Complete Complete  Social Work Consult for [place] Planning/Counseling Complete Not Complete Not Complete  SW consult not completed comments - NA -  Palliative Care Screening Complete Complete Not Complete  Medication Review Press photographer) Complete Complete Complete  Some recent data might be hidden

## 2019-05-14 NOTE — Discharge Summary (Signed)
Sheila Hayes, is a 78 y.o. female  DOB 16-Jul-1940  MRN [id number].  Admission date:  05/08/2019  Admitting Physician  Therisa Doyne, MD  Discharge Date:  05/14/2019   Primary MD  Housecalls, Doctors Making  Recommendations for primary care physician for things to follow:  -Please consult palliative medicine on admission, with goals of mind is being comfort is a priority, and to avoid rehospitalization. -Patient on dysphagia 3 diet, advance as tolerated. -He is on 3 L nasal cannula at baseline, can go up to 6 L if needed.  CODE STATUS DNR   Admission Diagnosis  Acute on chronic respiratory failure with hypoxia and hypercapnia (HCC) [J96.21, J96.22] COVID-19 virus infection [U07.1]   Discharge Diagnosis  Acute on chronic respiratory failure with hypoxia and hypercapnia (HCC) [J96.21, J96.22] COVID-19 virus infection [U07.1]    Active Problems:   COPD exacerbation (HCC)   HTN (hypertension)   Diabetes (HCC)   HLD (hyperlipidemia)   Chronic diastolic CHF (congestive heart failure) (HCC)   Hypothyroidism   Acute on chronic respiratory failure with hypoxia and hypercapnia (HCC)   Pneumonia due to COVID-19 virus   Palliative care by specialist   DNR (do not resuscitate)   COVID-19 virus infection   Weakness generalized      Past Medical History:  Diagnosis Date   Chronic diastolic congestive heart failure (HCC)    COPD (chronic obstructive pulmonary disease) (HCC)    Diabetes mellitus without complication (HCC)    Hyperlipidemia    Hypertension    Hypothyroidism    Morbid obesity (HCC)    Pulmonary hypertension (HCC)     Past Surgical History:  Procedure Laterality Date   APPENDECTOMY     CHOLECYSTECTOMY         History of present illness and  Hospital Course:     Kindly see H&P for history of present illness and admission details, please review complete  Labs, Consult reports and Test reports for all details in brief  HPI  from the history and physical done on the day of admission 05/08/2019  Sheila Hayes is a 78 y.o. female with medical history significant of Covid infection diagnosed on 4 November, diastolic CHF, COPD, diabetes, HLD, HTN, hypothyroidism, obesity, pulmonary hypertension, MRSA bacteremia, dementia    Presented with   worsening shortness of breath was discharged yesterday after prolonged hospital stay for Covid.  Have had increased oxygen requirement today since her discharge has been of 3 to 4 L  She was treated with Remdesivir and steroids During hospital stay she developed a.fib easily rate-controlled with beta blocker and started on lovenox for stroke risk reduction. This was later converted to Eliquis  Original COVID positive test was done at [facility] Course    Sheila Hayes a 78 y.o.femalewith medical history significant of Covid infection diagnosed on 4 November, diastolic CHF, COPD, diabetes, HLD, HTN, hypothyroidism, obesity, pulmonary hypertension, MRSA bacteremia, dementia. Presented withworsening shortness of breath was discharged yesterday after prolonged hospital stay for Covid. Have had  increased oxygen requirement today since her discharge has been of 3 to 4 L. She was treated with Remdesivir and steroids. During hospital stay she developed a.fibeasily rate-controlled with beta blocker and started on lovenox for stroke risk reduction. This was later converted toEliquis.   Acute on chronic hypoxic respiratory failure. -Appears to be multifactorial, in the setting of COPD exacerbation, recent COVID-19 pneumonia, and likely aspiration pneumonia. -Acute PE is ruled out. -On IV Unasyn for aspiration pneumonia, then  on Augmentin, finished 7 days treatment . - on IV Decadron, changed to  IV Solu-Medrol for COPD, she will be discharged on prednisone taper. -He [facility], no indication to repeat. -CT remarkable for right middle lobe infiltrate with negative procalcitonin, highly suggestive of acute aspiration versus pneumonitis; less likely bacterial pneumonia -Certainly could be post viral inflammatory findings although would suspect infiltrate to be less localized -Continue Symbicort/Spiriva, albuterol HFA; hold off on nebs given recent COVID-19 positive - procalcitonin, inflammatory markers, CRP and D-dimers within normal limit which is reassuring -COVID-19 given initial positive swab 05/02/2019 -Keep encouraging incentive spirometry and flutter valve at your facility. -She is on 3 L nasal cannula at baseline, and required to go up to 6 L on activity . COVID-19 Labs  Recent Labs (last 2 labs)        Recent Labs    05/11/19 0137 05/12/19 0010 05/12/19 0011  DDIMER 0.39 0.27  --   CRP 1.0*  --  <0.8     Aspiration pneumonia -See above discussion  COPD exacerbation -Please see above discussion   A. fib, rate controlled -Heart rate controlled on metoprolol -Continue anticoagulation with Eliquis  Hypertension, essential -Continue home medications, currently well controlled  Heart failure, diastolic, not in acute exacerbation -Appears euvolemic, continue Lasix -Continue home medications  Hypothyroidism  -Continue home Synthroid  Diabetes, hold, non-insulin-dependent -Continue home medications on discharge.  Goals of care -palliative care consult greatly appreciated, plan to discharge to skilled nursing facility for short-term rehab, with palliative consult, goals of care addressed with family, comfort is a priority, with aim to avoid rehospitalization, with no artificial feeding or hydration in the future, with plan for community-based palliative services on admission to facility, and to convert to hospice when eligible/complete MOST form with family at earliest  convenience.   Discharge Condition:  -Patient is stable at time of discharge, but overall she is extremely frail, and tenuous, and that is why recommendation for palliative care and admission to fill MOST form with the family and prevent rehospitalization, and establish hospice once appropriate.     Discharge Instructions  and  Discharge Medications     Discharge Instructions    Discharge instructions   Complete by: As directed    Get CBC, CMP, checked  by Primary MD next visit.    Activity: As tolerated with Full fall precautions use walker/cane & assistance as needed   Disposition SNF   Diet: Carb modified/dysphagia 3 with thin liquids, with feeding assistance and aspiration precautions.  For Heart failure patients - Check your Weight same time everyday, if you gain over 2 pounds, or you develop in leg swelling, experience more shortness of breath or chest pain, call your Primary MD immediately. Follow Cardiac Low Salt Diet and 1.5 lit/day fluid restriction.   On your next visit with your primary care physician please Get Medicines reviewed and adjusted.   Please request your Prim.MD to go over all Hospital Tests and Procedure/Radiological  results at the follow up, please get all Hospital records sent to your Prim MD by signing hospital release before you go home.   If you experience worsening of your admission symptoms, develop shortness of breath, life threatening emergency, suicidal or homicidal thoughts you must seek medical attention immediately by calling 911 or calling your MD immediately  if symptoms less severe.  You Must read complete instructions/literature along with all the possible adverse reactions/side effects for all the Medicines you take and that have been prescribed to you. Take any new Medicines after you have completely understood and accpet all the possible adverse reactions/side effects.   Do not drive, operating heavy machinery, perform activities at  heights, swimming or participation in water activities or provide baby sitting services if your were admitted for syncope or siezures until you have seen by Primary MD or a Neurologist and advised to do so again.  Do not drive when taking Pain medications.    Do not take more than prescribed Pain, Sleep and Anxiety Medications  Special Instructions: If you have smoked or chewed Tobacco  in the last 2 yrs please stop smoking, stop any regular Alcohol  and or any Recreational drug use.  Wear Seat belts while driving.   Please note  You were cared for by a hospitalist during your hospital stay. If you have any questions about your discharge medications or the care you received while you were in the hospital after you are discharged, you can call the unit and asked to speak with the hospitalist on call if the hospitalist that took care of you is not available. Once you are discharged, your primary care physician will handle any further medical issues. Please note that NO REFILLS for any discharge medications will be authorized once you are discharged, as it is imperative that you return to your primary care physician (or establish a relationship with a primary care physician if you do not have one) for your aftercare needs so that they can reassess your need for medications and monitor your lab values.     Allergies as of 05/14/2019   No Known Allergies     Medication List    STOP taking these medications   dexamethasone 6 MG tablet Commonly known as: Decadron   ipratropium-albuterol 0.5-2.5 (3) MG/3ML Soln Commonly known as: DUONEB     TAKE these medications   acetaminophen 325 MG tablet Commonly known as: TYLENOL Take 2 tablets (650 mg total) by mouth every 6 (six) hours as needed for mild pain or headache (fever >/= 101). What changed:   medication strength  how much to take  reasons to take this   albuterol 108 (90 Base) MCG/ACT inhaler Commonly known as: VENTOLIN  HFA Inhale 2 puffs into the lungs every 6 (six) hours as needed for wheezing or shortness of breath.   apixaban 5 MG Tabs tablet Commonly known as: ELIQUIS Take 1 tablet (5 mg total) by mouth 2 (two) times daily.   budesonide-formoterol 80-4.5 MCG/ACT inhaler Commonly known as: SYMBICORT Inhale 2 puffs into the lungs 2 (two) times daily.   calcium carbonate 1500 (600 Ca) MG Tabs tablet Commonly known as: OSCAL Take 600 mg of elemental calcium by mouth 2 (two) times daily with a meal.   citalopram 40 MG tablet Commonly known as: CELEXA Take 40 mg by mouth daily.   docusate sodium 100 MG capsule Commonly known as: COLACE Take 200 mg by mouth every other day.   furosemide 20 MG tablet  Commonly known as: LASIX Take 20 mg by mouth daily.   gabapentin 100 MG capsule Commonly known as: NEURONTIN Take 200 mg by mouth 2 (two) times daily.   glipiZIDE 10 MG tablet Commonly known as: GLUCOTROL Take 10 mg by mouth daily before breakfast.   levothyroxine 125 MCG tablet Commonly known as: SYNTHROID Take 125 mcg by mouth daily before breakfast.   LORazepam 0.5 MG tablet Commonly known as: ATIVAN Take 1 tablet (0.5 mg total) by mouth every 8 (eight) hours as needed for anxiety.   metFORMIN 500 MG tablet Commonly known as: GLUCOPHAGE Take 1,000 mg by mouth 2 (two) times daily with a meal.   metoprolol tartrate 25 MG tablet Commonly known as: LOPRESSOR Take 0.5 tablets (12.5 mg total) by mouth 2 (two) times daily. What changed: additional instructions   nicotine 21 mg/24hr patch Commonly known as: NICODERM CQ - dosed in mg/24 hours Place 1 patch (21 mg total) onto the skin daily.   nystatin powder Generic drug: nystatin Apply topically 2 (two) times daily as needed. For fungal growth   omeprazole 20 MG capsule Commonly known as: PRILOSEC Take 20 mg by mouth daily.   OXYGEN Inhale 3 L into the lungs continuous.   potassium chloride 10 MEQ tablet Commonly known as:  KLOR-CON Take 10 mEq by mouth daily.   predniSONE 10 MG (21) Tbpk tablet Commonly known as: STERAPRED UNI-PAK 21 TAB Use per package instruction   prochlorperazine 10 MG tablet Commonly known as: COMPAZINE Take 10 mg by mouth every 6 (six) hours as needed for nausea or vomiting.   rosuvastatin 40 MG tablet Commonly known as: CRESTOR Take 40 mg by mouth every evening.   tiotropium 18 MCG inhalation capsule Commonly known as: SPIRIVA Place 18 mcg into inhaler and inhale daily.         Diet and Activity recommendation: See Discharge Instructions above   Consults obtained -  palliative   Major procedures and Radiology Reports - PLEASE review detailed and final reports for all details, in brief -      Ct Angio Chest Pe W Or Wo Contrast  Result Date: 05/09/2019 CLINICAL DATA:  COVID.  Shortness of breath. EXAM: CT ANGIOGRAPHY CHEST WITH CONTRAST TECHNIQUE: Multidetector CT imaging of the chest was performed using the standard protocol during bolus administration of intravenous contrast. Multiplanar CT image reconstructions and MIPs were obtained to evaluate the vascular anatomy. CONTRAST:  OMNIPAQUE IOHEXOL 350 MG/ML SOLN COMPARISON:  Chest x-ray today. FINDINGS: Cardiovascular: No filling defects in the pulmonary arteries to suggest pulmonary emboli. Mild cardiomegaly. Coronary artery and aortic atherosclerosis. No aneurysm. Mediastinum/Nodes: No mediastinal, hilar, or axillary adenopathy. Lungs/Pleura: Small bilateral pleural effusions. Airspace disease in the right middle lobe and right lower lobe concerning for pneumonia. Left base atelectasis. Moderate emphysema. Upper Abdomen: Imaging into the upper abdomen shows no acute findings. Musculoskeletal: Chest wall soft tissues are unremarkable. No acute bony abnormality. Review of the MIP images confirms the above findings. IMPRESSION: No evidence of pulmonary embolus. Small bilateral pleural effusions. Consolidation in the  right middle lobe and right lower lobe concerning for pneumonia. Coronary artery disease. Aortic Atherosclerosis (ICD10-I70.0) and Emphysema (ICD10-J43.9). Electronically Signed   By: Charlett Nose M.D.   On: 05/09/2019 00:49   Xr Chest Portable  Result Date: 05/08/2019 CLINICAL DATA:  78 year old female with COVID-19. Shortness of breath, hypoxia. EXAM: PORTABLE CHEST 1 VIEW COMPARISON:  Portable chest 05/01/2019 and earlier. FINDINGS: Portable AP semi upright view at 1935 hours. Moderate  opacification of the right mid and lower lung. Increasing patchy left lung base opacity. Only the left upper lobe appears spared currently. Stable cardiac size and mediastinal contours. No pneumothorax. Difficult to exclude pleural effusions. Paucity of bowel gas. No acute osseous abnormality identified. IMPRESSION: Increasing lower lung predominant airspace opacity greater on the right compatible with progressive COVID-19 pneumonia. Pleural effusions difficult to exclude. Electronically Signed   By: Genevie Ann M.D.   On: 05/08/2019 19:48   Dg Chest Port 1 View  Result Date: 05/01/2019 CLINICAL DATA:  Hypoxia.  COVID-19 positive. EXAM: PORTABLE CHEST 1 VIEW COMPARISON:  09/03/2018.  09/01/2018.06/14/2018.  03/03/2018. FINDINGS: Stable cardiomegaly. Prominent bilateral interstitial prominence. No pleural effusion or pneumothorax. No acute bony abnormality. IMPRESSION: 1. Prominent bilateral interstitial prominence. Pneumonitis could present in this fashion with known COVID-19 infection. 2.  Cardiomegaly. Electronically Signed   By: Marcello Moores  Register   On: 05/01/2019 11:57   Dg Swallowing Func-speech Pathology  Result Date: 05/10/2019 Objective Swallowing Evaluation: Type of Study: MBS-Modified Barium Swallow Study  Patient Details Name: Sheila Hayes MRN: [id number] Date of Birth: 1941-01-10 Today's Date: 05/10/2019 Time: SLP Start Time (ACUTE ONLY): 1455 -SLP Stop Time (ACUTE ONLY): 5188 SLP Time Calculation (min)  (ACUTE ONLY): 20 min Past Medical History: Past Medical History: Diagnosis Date  Chronic diastolic congestive heart failure (HCC)   COPD (chronic obstructive pulmonary disease) ([place])   Diabetes mellitus without complication ([place])   Hyperlipidemia   Hypertension   Hypothyroidism   Morbid obesity ([place])   Pulmonary hypertension ([place])  Past Surgical History: Past Surgical History: Procedure Laterality Date  APPENDECTOMY    CHOLECYSTECTOMY   HPI: 78 y.o. female with medical history significant for Covid infection diagnosed on 4 November, diastolic CHF, COPD, diabetes, HLD, HTN, hypothyroidism, obesity, pulmonary hypertension, MRSA bacteremia, dementia.  Hospitalized at [facility] from 11/9-16, D/Cd to [facility], then presented to ED later that evening with worsening shortness of breath, increasing O2 requirements.  Pt underwent bedside swallow evaluation on 11/10 - mechanical soft/thin liquids were recommended due to lack of dentition; otherwise, swallow was functional and no SLP f/u was needed.  Subjective: lethargic Assessment / Plan / Recommendation CHL IP CLINICAL IMPRESSIONS 05/10/2019 Clinical Impression Pt demonstrates normal swallow function for age. There were consistent episodes of trace flash penetration, but otherwise swallowing musculature was strong and pt response to PO was timely. Esophageal sweep appeared WNL, no radiologist present to confirm.  Episodic aspiration in the elderly, particularly when deconditioned, are of course possible. Pt should be assisted with meal set up and upright posture but there is no evidence in this test to support modified diet or PO restrictions. Pt to resume a mechanical soft/thin liquid diet.  SLP will f/u x1 at bedside to ensure best practive with meals.  SLP Visit Diagnosis Dysphagia, unspecified (R13.10) Attention and concentration deficit following -- Frontal lobe and executive function deficit following -- Impact on safety and function Mild  aspiration risk   CHL IP TREATMENT RECOMMENDATION 05/10/2019 Treatment Recommendations Therapy as outlined in treatment plan below   Prognosis 05/10/2019 Prognosis for Safe Diet Advancement Good Barriers to Reach Goals -- Barriers/Prognosis Comment -- CHL IP DIET RECOMMENDATION 05/10/2019 SLP Diet Recommendations Dysphagia 3 (Mech soft) solids;Thin liquid Liquid Administration via Cup;Straw Medication Administration Whole meds with liquid Compensations Slow rate;Small sips/bites Postural Changes Remain semi-upright after after feeds/meals (Comment);Seated upright at 90 degrees   CHL IP OTHER RECOMMENDATIONS 05/10/2019 Recommended Consults -- Oral Care Recommendations Oral care BID Other Recommendations --  CHL IP FOLLOW UP RECOMMENDATIONS 05/10/2019 Follow up Recommendations Skilled Nursing facility   [facility] IP FREQUENCY AND DURATION 05/10/2019 Speech Therapy Frequency (ACUTE ONLY) min 1 x/week Treatment Duration 2 weeks      CHL IP ORAL PHASE 05/10/2019 Oral Phase WFL Oral - Pudding Teaspoon -- Oral - Pudding Cup -- Oral - Honey Teaspoon -- Oral - Honey Cup -- Oral - Nectar Teaspoon -- Oral - Nectar Cup -- Oral - Nectar Straw -- Oral - Thin Teaspoon -- Oral - Thin Cup -- Oral - Thin Straw -- Oral - Puree -- Oral - Mech Soft -- Oral - Regular -- Oral - Multi-Consistency -- Oral - Pill -- Oral Phase - Comment --  CHL IP PHARYNGEAL PHASE 05/10/2019 Pharyngeal Phase WFL Pharyngeal- Pudding Teaspoon -- Pharyngeal -- Pharyngeal- Pudding Cup -- Pharyngeal -- Pharyngeal- Honey Teaspoon -- Pharyngeal -- Pharyngeal- Honey Cup -- Pharyngeal -- Pharyngeal- Nectar Teaspoon -- Pharyngeal -- Pharyngeal- Nectar Cup -- Pharyngeal -- Pharyngeal- Nectar Straw -- Pharyngeal -- Pharyngeal- Thin Teaspoon -- Pharyngeal -- Pharyngeal- Thin Cup -- Pharyngeal -- Pharyngeal- Thin Straw -- Pharyngeal -- Pharyngeal- Puree -- Pharyngeal -- Pharyngeal- Mechanical Soft -- Pharyngeal -- Pharyngeal- Regular -- Pharyngeal -- Pharyngeal-  Multi-consistency -- Pharyngeal -- Pharyngeal- Pill -- Pharyngeal -- Pharyngeal Comment --  No flowsheet data found. Harlon Ditty, MA CCC-SLP Acute Rehabilitation Services Pager [phone] Office [phone] Claudine Mouton 05/10/2019, 3:29 PM               Micro Results     No results found for this or any previous visit (from the past 240 hour(s)).     Today   Subjective:   Sheila Hayes today soft denies any complaints, ports she had a good night sleep, no significant events as discussed with staff, she has a good appetite.  Objective:   Blood pressure (!) 112/50, pulse 62, temperature 98.1 F (36.7 C), temperature source Axillary, resp. rate 18, SpO2 93 %.   Intake/Output Summary (Last 24 hours) at 05/14/2019 1109 Last data filed at 05/14/2019 0600 Gross per 24 hour  Intake 960 ml  Output 850 ml  Net 110 ml    Exam Awake Alert, Oriented x 2, easily distracted, no new F.N deficits, Normal affect, frail Symmetrical Chest wall movement, Good air movement bilaterally, CTAB RRR,No Gallops,Rubs or new Murmurs, No Parasternal Heave +ve B.Sounds, Abd Soft, Non tender, No rebound -guarding or rigidity. No Cyanosis, Clubbing or edema, No new Rash or bruise  Data Review   CBC w Diff:  Lab Results  Component Value Date   WBC 7.3 05/12/2019   HGB 7.7 (L) 05/12/2019   HGB 9.8 (L) 02/06/2014   HCT 29.2 (L) 05/12/2019   HCT 30.0 (L) 02/06/2014   PLT 335 05/12/2019   PLT 196 02/06/2014   LYMPHOPCT 14 05/12/2019   LYMPHOPCT 18.1 02/06/2014   MONOPCT 4 05/12/2019   MONOPCT 7.1 02/06/2014   EOSPCT 0 05/12/2019   EOSPCT 0.5 02/06/2014   BASOPCT 0 05/12/2019   BASOPCT 0.6 02/06/2014    CMP:  Lab Results  Component Value Date   NA 142 05/12/2019   NA 146 (H) 02/05/2014   K 4.1 05/12/2019   K 4.4 02/05/2014   CL 89 (L) 05/12/2019   CL 113 (H) 02/05/2014   CO2 43 (H) 05/12/2019   CO2 29 02/05/2014   BUN 22 05/12/2019   BUN 18 02/05/2014    CREATININE 0.78 05/12/2019   CREATININE 0.77 02/05/2014   PROT 5.2 (L) 05/12/2019  PROT 6.3 (L) 07/24/2013   ALBUMIN 2.4 (L) 05/12/2019   ALBUMIN 2.8 (L) 07/24/2013   BILITOT 0.2 (L) 05/12/2019   BILITOT 0.6 07/24/2013   ALKPHOS 45 05/12/2019   ALKPHOS 69 07/24/2013   AST 18 05/12/2019   AST 22 07/24/2013   ALT 16 05/12/2019   ALT 15 07/24/2013  .   Total Time in preparing paper work, data evaluation and todays exam - 35 minutes  Huey Bienenstock M.D on 05/14/2019 at 11:09 AM  [facility]   Office  [phone]

## 2019-05-30 ENCOUNTER — Non-Acute Institutional Stay: Payer: Self-pay | Admitting: Internal Medicine

## 2019-06-01 NOTE — Progress Notes (Signed)
[facility] [facility] Palliative Care Consult Note Telephone: [phone]  Fax: [phone]  PATIENT NAME: Sheila Hayes DOB: 1941/04/24 MRN: [id number]  PRIMARY CARE PROVIDER:   [facility]  REFERRING PROVIDER:  Dr. Doree Barthel  RESPONSIBLE PARTY:   Patient.  Emergency Contacts: Dwyane Luo (sister)  [phone]                                      Gevena Mart (niece) [phone]                                         RECOMMENDATIONS and PLAN:  Palliative Care Encounter  1.  Advance care planning:  Unable to elicit responses from patient due to current state of decline, new pneumonia, partial unresponsiveness.  Phoned sister and niece (only immediate family members) to discuss goals of care and advanced directives.  Previous DNAR and MOST form selections made by patient were reviewed. No formative HCPOA documents. Family members chose to re-initiate comfort measures if patient is eligible to resume Hospice services.  MOST form was updated.   No desire to return to the hospital, no tube feedings, continue antibiotics and IV fluids if indicated.  Both family members were in agreement with decisions as related to previous conversations with patient.  The original goal was to return to Hospice at [facility] in [place], [place]. Updated Danielle NP and Dr. Keenan Bachelor related to above and pt status.  Phone conference with Dr. Gildardo Cranker as well who agrees that patient is eligible to return to [facility].  Hospice referral will be made by facility team.  Palliative care will continue to follow until transition to Hospice.   2.  Shortness of breath with hypoxia:  Related to new onset pneumonia and recent COVID-19 infection.  Provide supplemental O2 via face mask due to mouth breathing. Titrate oxygen to maintain saturation at 90% or greater.  Consider initiation of Morphine concentrate solution as necessary for dyspnea. Comfort measures.  3.  Weakness:   Multifactorial as related above along with COPD and CHF.  Provide all comfort measures.  Comfort feedings and hydration.   I spent 90 minutes providing this consultation,  from 1700 to 1830. More than 50% of the time in this consultation was spent coordinating communication with patient, clinical staff, providers and immediate family members via phone.   HISTORY OF PRESENT ILLNESS: Follow-up with Sheila Hayes.  NP Andee Poles reports that patient was diagnosed with pneumonia today and has begun treatment with Doxycycline and an IV bolus.  Staff reports that nutritional intake has decreased to bites and sips.  She is much less communicative. Prior to today, she was able to mobilize self in her wheelchair and had an adequate oral intake. Palliative Care was asked to help address goals of care.   CODE STATUS: DNAR/DNI  PPS: 20%  Bedbound HOSPICE ELIGIBILITY/DIAGNOSIS: YES:  Pneumonia with hypoxia, COVID-19 infection, CHF  PAST MEDICAL HISTORY:  Past Medical History:  Diagnosis Date  . Chronic diastolic congestive heart failure ([place])   . COPD (chronic obstructive pulmonary disease) ([place])   . Diabetes mellitus without complication ([place])   . Hyperlipidemia   . Hypertension   . Hypothyroidism   . Morbid obesity ([place])   . Pulmonary hypertension ([place])      PERTINENT  MEDICATIONS:  Outpatient Encounter Medications as of 05/30/2019  Medication Sig  . acetaminophen (TYLENOL) 325 MG tablet Take 2 tablets (650 mg total) by mouth every 6 (six) hours as needed for mild pain or headache (fever >/= 101).  Marland Kitchen albuterol (PROVENTIL HFA;VENTOLIN HFA) 108 (90 Base) MCG/ACT inhaler Inhale 2 puffs into the lungs every 6 (six) hours as needed for wheezing or shortness of breath.  Marland Kitchen apixaban (ELIQUIS) 5 MG TABS tablet Take 1 tablet (5 mg total) by mouth 2 (two) times daily.  . budesonide-formoterol (SYMBICORT) 80-4.5 MCG/ACT inhaler Inhale 2 puffs into the lungs 2 (two) times daily.  . calcium carbonate (OSCAL)  1500 (600 Ca) MG TABS tablet Take 600 mg of elemental calcium by mouth 2 (two) times daily with a meal.   . citalopram (CELEXA) 40 MG tablet Take 40 mg by mouth daily.  Marland Kitchen docusate sodium (COLACE) 100 MG capsule Take 200 mg by mouth every other day.   . furosemide (LASIX) 20 MG tablet Take 20 mg by mouth daily.  Marland Kitchen gabapentin (NEURONTIN) 100 MG capsule Take 200 mg by mouth 2 (two) times daily.  Marland Kitchen glipiZIDE (GLUCOTROL) 10 MG tablet Take 10 mg by mouth daily before breakfast.  . levothyroxine (SYNTHROID, LEVOTHROID) 125 MCG tablet Take 125 mcg by mouth daily before breakfast.  . LORazepam (ATIVAN) 0.5 MG tablet Take 1 tablet (0.5 mg total) by mouth every 8 (eight) hours as needed for anxiety.  . metFORMIN (GLUCOPHAGE) 500 MG tablet Take 1,000 mg by mouth 2 (two) times daily with a meal.   . metoprolol tartrate (LOPRESSOR) 25 MG tablet Take 0.5 tablets (12.5 mg total) by mouth 2 (two) times daily. (Patient taking differently: Take 12.5 mg by mouth 2 (two) times daily. HOLD for SBP <100 or HR <60)  . nicotine (NICODERM CQ - DOSED IN MG/24 HOURS) 21 mg/24hr patch Place 1 patch (21 mg total) onto the skin daily.  Marland Kitchen nystatin (NYSTATIN) powder Apply topically 2 (two) times daily as needed. For fungal growth  . omeprazole (PRILOSEC) 20 MG capsule Take 20 mg by mouth daily.  . OXYGEN Inhale 3 L into the lungs continuous.  . potassium chloride (K-DUR) 10 MEQ tablet Take 10 mEq by mouth daily.  . predniSONE (STERAPRED UNI-PAK 21 TAB) 10 MG (21) TBPK tablet Use per package instruction  . prochlorperazine (COMPAZINE) 10 MG tablet Take 10 mg by mouth every 6 (six) hours as needed for nausea or vomiting.  . rosuvastatin (CRESTOR) 40 MG tablet Take 40 mg by mouth every evening.   . tiotropium (SPIRIVA) 18 MCG inhalation capsule Place 18 mcg into inhaler and inhale daily.   No facility-administered encounter medications on file as of 05/30/2019.    PHYSICAL EXAM:   General: Appears to be in poor health, obese  female resting supine in bed Cardiovascular: Rapid rate and  regular  rhythm Pulmonary: Very congested cough with shallow respirations. O2 via [place] replaced.  O2 sats found to be 66% prior to replacement of nasal cannula with improvement of oxygen to 79-80% Abdomen: soft, nontender, hypoactive bowel sounds GU: no suprapubic tenderness Extremities: no edema, no joint deformities Skin: pale in color Neurological: Somnolent, weakness.  Arouses to calling name several times.  Speaks in 1-2 words  Margaretha Sheffield, NP-C

## 2019-06-27 ENCOUNTER — Non-Acute Institutional Stay: Payer: Self-pay | Admitting: Internal Medicine

## 2019-06-28 ENCOUNTER — Other Ambulatory Visit: Payer: Self-pay

## 2019-07-27 ENCOUNTER — Encounter (HOSPITAL_COMMUNITY): Payer: Self-pay

## 2019-07-27 ENCOUNTER — Other Ambulatory Visit: Payer: Self-pay

## 2019-07-27 ENCOUNTER — Inpatient Hospital Stay (HOSPITAL_COMMUNITY)
Admission: EM | Admit: 2019-07-27 | Discharge: 2019-07-30 | DRG: 378 | Disposition: A | Discharge status: Skilled Nursing Facility | Payer: Medicare Other | Source: Skilled Nursing Facility | Attending: Internal Medicine | Admitting: Internal Medicine

## 2019-07-27 DIAGNOSIS — D62 Acute posthemorrhagic anemia: Secondary | ICD-10-CM | POA: Diagnosis present

## 2019-07-27 DIAGNOSIS — Z7952 Long term (current) use of systemic steroids: Secondary | ICD-10-CM

## 2019-07-27 DIAGNOSIS — Z801 Family history of malignant neoplasm of trachea, bronchus and lung: Secondary | ICD-10-CM

## 2019-07-27 DIAGNOSIS — E039 Hypothyroidism, unspecified: Secondary | ICD-10-CM | POA: Diagnosis present

## 2019-07-27 DIAGNOSIS — Z9049 Acquired absence of other specified parts of digestive tract: Secondary | ICD-10-CM

## 2019-07-27 DIAGNOSIS — Z800 Family history of malignant neoplasm of digestive organs: Secondary | ICD-10-CM

## 2019-07-27 DIAGNOSIS — K317 Polyp of stomach and duodenum: Secondary | ICD-10-CM | POA: Diagnosis present

## 2019-07-27 DIAGNOSIS — R109 Unspecified abdominal pain: Secondary | ICD-10-CM

## 2019-07-27 DIAGNOSIS — Z7984 Long term (current) use of oral hypoglycemic drugs: Secondary | ICD-10-CM

## 2019-07-27 DIAGNOSIS — Z79899 Other long term (current) drug therapy: Secondary | ICD-10-CM

## 2019-07-27 DIAGNOSIS — E785 Hyperlipidemia, unspecified: Secondary | ICD-10-CM | POA: Diagnosis present

## 2019-07-27 DIAGNOSIS — Z8249 Family history of ischemic heart disease and other diseases of the circulatory system: Secondary | ICD-10-CM

## 2019-07-27 DIAGNOSIS — J449 Chronic obstructive pulmonary disease, unspecified: Secondary | ICD-10-CM | POA: Diagnosis present

## 2019-07-27 DIAGNOSIS — Z8614 Personal history of Methicillin resistant Staphylococcus aureus infection: Secondary | ICD-10-CM

## 2019-07-27 DIAGNOSIS — D649 Anemia, unspecified: Secondary | ICD-10-CM

## 2019-07-27 DIAGNOSIS — K922 Gastrointestinal hemorrhage, unspecified: Secondary | ICD-10-CM | POA: Diagnosis not present

## 2019-07-27 DIAGNOSIS — I071 Rheumatic tricuspid insufficiency: Secondary | ICD-10-CM | POA: Diagnosis present

## 2019-07-27 DIAGNOSIS — E119 Type 2 diabetes mellitus without complications: Secondary | ICD-10-CM

## 2019-07-27 DIAGNOSIS — I272 Pulmonary hypertension, unspecified: Secondary | ICD-10-CM | POA: Diagnosis present

## 2019-07-27 DIAGNOSIS — Z9981 Dependence on supplemental oxygen: Secondary | ICD-10-CM

## 2019-07-27 DIAGNOSIS — J9611 Chronic respiratory failure with hypoxia: Secondary | ICD-10-CM | POA: Diagnosis present

## 2019-07-27 DIAGNOSIS — Z79890 Hormone replacement therapy (postmenopausal): Secondary | ICD-10-CM

## 2019-07-27 DIAGNOSIS — Z833 Family history of diabetes mellitus: Secondary | ICD-10-CM

## 2019-07-27 DIAGNOSIS — K254 Chronic or unspecified gastric ulcer with hemorrhage: Secondary | ICD-10-CM | POA: Diagnosis not present

## 2019-07-27 DIAGNOSIS — I10 Essential (primary) hypertension: Secondary | ICD-10-CM

## 2019-07-27 DIAGNOSIS — Z7901 Long term (current) use of anticoagulants: Secondary | ICD-10-CM

## 2019-07-27 DIAGNOSIS — Z66 Do not resuscitate: Secondary | ICD-10-CM | POA: Diagnosis present

## 2019-07-27 DIAGNOSIS — D638 Anemia in other chronic diseases classified elsewhere: Secondary | ICD-10-CM | POA: Diagnosis present

## 2019-07-27 DIAGNOSIS — D509 Iron deficiency anemia, unspecified: Secondary | ICD-10-CM | POA: Diagnosis present

## 2019-07-27 DIAGNOSIS — I5032 Chronic diastolic (congestive) heart failure: Secondary | ICD-10-CM | POA: Diagnosis not present

## 2019-07-27 DIAGNOSIS — Z6835 Body mass index (BMI) 35.0-35.9, adult: Secondary | ICD-10-CM

## 2019-07-27 DIAGNOSIS — I4510 Unspecified right bundle-branch block: Secondary | ICD-10-CM | POA: Diagnosis present

## 2019-07-27 DIAGNOSIS — Z7951 Long term (current) use of inhaled steroids: Secondary | ICD-10-CM

## 2019-07-27 DIAGNOSIS — I110 Hypertensive heart disease with heart failure: Secondary | ICD-10-CM | POA: Diagnosis present

## 2019-07-27 DIAGNOSIS — I480 Paroxysmal atrial fibrillation: Secondary | ICD-10-CM | POA: Diagnosis present

## 2019-07-27 DIAGNOSIS — F0390 Unspecified dementia without behavioral disturbance: Secondary | ICD-10-CM | POA: Diagnosis present

## 2019-07-27 DIAGNOSIS — Z79891 Long term (current) use of opiate analgesic: Secondary | ICD-10-CM

## 2019-07-27 DIAGNOSIS — Z8616 Personal history of COVID-19: Secondary | ICD-10-CM

## 2019-07-27 DIAGNOSIS — Z87891 Personal history of nicotine dependence: Secondary | ICD-10-CM

## 2019-07-27 HISTORY — DX: Paroxysmal atrial fibrillation: I48.0

## 2019-07-27 LAB — COMPREHENSIVE METABOLIC PANEL
ALT: 13 U/L (ref 0–44)
AST: 17 U/L (ref 15–41)
Albumin: 3.3 g/dL — ABNORMAL LOW (ref 3.5–5.0)
Alkaline Phosphatase: 40 U/L (ref 38–126)
Anion gap: 11 (ref 5–15)
BUN: 15 mg/dL (ref 8–23)
CO2: 36 mmol/L — ABNORMAL HIGH (ref 22–32)
Calcium: 8.8 mg/dL — ABNORMAL LOW (ref 8.9–10.3)
Chloride: 95 mmol/L — ABNORMAL LOW (ref 98–111)
Creatinine, Ser: 1 mg/dL (ref 0.44–1.00)
GFR calc Af Amer: >60 mL/min (ref >60)
GFR calc non Af Amer: 54 mL/min — ABNORMAL LOW (ref >60)
Glucose, Bld: 155 mg/dL — ABNORMAL HIGH (ref 70–99)
Potassium: 3.6 mmol/L (ref 3.5–5.1)
Sodium: 142 mmol/L (ref 135–145)
Total Bilirubin: 0.8 mg/dL (ref 0.3–1.2)
Total Protein: 6.4 g/dL — ABNORMAL LOW (ref 6.5–8.1)

## 2019-07-27 LAB — CBC WITH DIFFERENTIAL/PLATELET
Abs Immature Granulocytes: 0.02 10*3/uL (ref 0.00–0.07)
Basophils Absolute: 0 10*3/uL (ref 0.0–0.1)
Basophils Relative: 1 %
Eosinophils Absolute: 0.1 10*3/uL (ref 0.0–0.5)
Eosinophils Relative: 1 %
HCT: 28.2 % — ABNORMAL LOW (ref 36.0–46.0)
Hemoglobin: 7.3 g/dL — ABNORMAL LOW (ref 12.0–15.0)
Immature Granulocytes: 0 %
Lymphocytes Relative: 23 %
Lymphs Abs: 1.2 10*3/uL (ref 0.7–4.0)
MCH: 21.5 pg — ABNORMAL LOW (ref 26.0–34.0)
MCHC: 25.9 g/dL — ABNORMAL LOW (ref 30.0–36.0)
MCV: 83.2 fL (ref 80.0–100.0)
Monocytes Absolute: 0.3 10*3/uL (ref 0.1–1.0)
Monocytes Relative: 7 %
Neutro Abs: 3.5 10*3/uL (ref 1.7–7.7)
Neutrophils Relative %: 68 %
Platelets: 219 10*3/uL (ref 150–400)
RBC: 3.39 MIL/uL — ABNORMAL LOW (ref 3.87–5.11)
RDW: 16.2 % — ABNORMAL HIGH (ref 11.5–15.5)
WBC: 5.1 10*3/uL (ref 4.0–10.5)
nRBC: 0 % (ref 0.0–0.2)

## 2019-07-27 LAB — RETICULOCYTES
Immature Retic Fract: 18.9 % — ABNORMAL HIGH (ref 2.3–15.9)
RBC.: 3.4 MIL/uL — ABNORMAL LOW (ref 3.87–5.11)
Retic Count, Absolute: 55.4 10*3/uL (ref 19.0–186.0)
Retic Ct Pct: 1.6 % (ref 0.4–3.1)

## 2019-07-27 LAB — IRON AND TIBC
Iron: 27 ug/dL — ABNORMAL LOW (ref 28–170)
Saturation Ratios: 7 % — ABNORMAL LOW (ref 10.4–31.8)
TIBC: 403 ug/dL (ref 250–450)
UIBC: 376 ug/dL

## 2019-07-27 LAB — GLUCOSE, CAPILLARY: Glucose-Capillary: 98 mg/dL (ref 70–99)

## 2019-07-27 LAB — PROTIME-INR
INR: 1.1 (ref 0.8–1.2)
Prothrombin Time: 14.3 seconds (ref 11.4–15.2)

## 2019-07-27 LAB — ABO/RH: ABO/RH(D): A NEG

## 2019-07-27 LAB — FOLATE: Folate: 10.7 ng/mL (ref >5.9)

## 2019-07-27 LAB — HEMOGLOBIN AND HEMATOCRIT, BLOOD
HCT: 27.4 % — ABNORMAL LOW (ref 36.0–46.0)
Hemoglobin: 7.3 g/dL — ABNORMAL LOW (ref 12.0–15.0)

## 2019-07-27 LAB — FERRITIN: Ferritin: 9 ng/mL — ABNORMAL LOW (ref 11–307)

## 2019-07-27 LAB — POC OCCULT BLOOD, ED: Fecal Occult Bld: POSITIVE — AB

## 2019-07-27 LAB — MAGNESIUM: Magnesium: 1.7 mg/dL (ref 1.7–2.4)

## 2019-07-27 MED ORDER — PANTOPRAZOLE SODIUM 40 MG IV SOLR
40.0000 mg | Freq: Two times a day (BID) | INTRAVENOUS | Status: DC
  Administered 2019-07-29 – 2019-07-30 (×4): 40 mg via INTRAVENOUS
  Filled 2019-07-27 (×4): qty 40

## 2019-07-27 MED ORDER — ONDANSETRON HCL 4 MG/2ML IJ SOLN
4.0000 mg | Freq: Four times a day (QID) | INTRAMUSCULAR | Status: DC | PRN
  Administered 2019-07-28: 15:00:00 4 mg via INTRAVENOUS

## 2019-07-27 MED ORDER — MOMETASONE FURO-FORMOTEROL FUM 100-5 MCG/ACT IN AERO
2.0000 | INHALATION_SPRAY | Freq: Two times a day (BID) | RESPIRATORY_TRACT | Status: DC
  Administered 2019-07-28 – 2019-07-30 (×4): 2 via RESPIRATORY_TRACT
  Filled 2019-07-27: qty 8.8

## 2019-07-27 MED ORDER — SODIUM CHLORIDE 0.9% FLUSH
3.0000 mL | Freq: Two times a day (BID) | INTRAVENOUS | Status: DC
  Administered 2019-07-27 – 2019-07-30 (×4): 3 mL via INTRAVENOUS

## 2019-07-27 MED ORDER — ALBUTEROL SULFATE (2.5 MG/3ML) 0.083% IN NEBU: 3.0000 mL | INHALATION_SOLUTION | RESPIRATORY_TRACT | Status: DC | PRN

## 2019-07-27 MED ORDER — PANTOPRAZOLE SODIUM 40 MG IV SOLR: 40.0000 mg | Freq: Two times a day (BID) | INTRAVENOUS | Status: DC

## 2019-07-27 MED ORDER — INSULIN ASPART 100 UNIT/ML ~~LOC~~ SOLN
0.0000 [IU] | Freq: Four times a day (QID) | SUBCUTANEOUS | Status: DC
  Administered 2019-07-29 – 2019-07-30 (×2): 2 [IU] via SUBCUTANEOUS
  Filled 2019-07-27: qty 0.09

## 2019-07-27 MED ORDER — PANTOPRAZOLE SODIUM 40 MG IV SOLR
40.0000 mg | Freq: Two times a day (BID) | INTRAVENOUS | Status: DC
  Administered 2019-07-27: 19:00:00 40 mg via INTRAVENOUS
  Filled 2019-07-27: qty 40

## 2019-07-27 MED ORDER — ACETAMINOPHEN 325 MG PO TABS: 650.0000 mg | ORAL_TABLET | Freq: Four times a day (QID) | ORAL | Status: DC | PRN

## 2019-07-27 MED ORDER — ACETAMINOPHEN 650 MG RE SUPP: 650.0000 mg | Freq: Four times a day (QID) | RECTAL | Status: DC | PRN

## 2019-07-27 MED ORDER — SODIUM CHLORIDE 0.9 % IV SOLN
8.0000 mg/h | INTRAVENOUS | Status: DC
  Filled 2019-07-27: qty 80

## 2019-07-27 MED ORDER — UMECLIDINIUM BROMIDE 62.5 MCG/INH IN AEPB
1.0000 | INHALATION_SPRAY | Freq: Every day | RESPIRATORY_TRACT | Status: DC
  Administered 2019-07-28 – 2019-07-30 (×2): 1 via RESPIRATORY_TRACT
  Filled 2019-07-27: qty 7

## 2019-07-27 MED ORDER — SODIUM CHLORIDE 0.9 % IV SOLN
80.0000 mg | Freq: Once | INTRAVENOUS | Status: DC
  Filled 2019-07-27: qty 80

## 2019-07-27 NOTE — ED Provider Notes (Signed)
[facility]-EMERGENCY DEPT Provider Note   CSN: [id number] Arrival date & time: 07/27/19  1555     History Chief Complaint  Patient presents with  . Abnormal Lab    Sheila Hayes is a 79 y.o. female who presents today to [facility] ED for evaluation of a hemoglobin of 6.8 that was discovered on labs are her long term care facility.  Dementia barring further history from the patient.      Past Medical History:  Diagnosis Date  . Chronic diastolic congestive heart failure (HCC)   . COPD (chronic obstructive pulmonary disease) (HCC)   . Diabetes mellitus without complication (HCC)   . Hyperlipidemia   . Hypertension   . Hypothyroidism   . Morbid obesity (H[facility])   . Paroxysmal A-fib (HCC)   . Pulmonary hypertension [facility])     Patient Active Problem List   Diagnosis Date Noted  . Acute lower GI bleeding 07/27/2019  . Weakness generalized   . Palliative care by specialist   . DNR (do not resuscitate)   . COVID-19 virus infection   . Pneumonia due to COVID-19 virus 05/02/2019  . MRSA bacteremia 09/02/2018  . Acute respiratory failure with hypercapnia (HCC) 06/14/2018  . Acute on chronic diastolic CHF (congestive heart failure) (HCC) 02/23/2018  . Altered mental status   . Urinary tract infection without hematuria   . Acute on chronic respiratory failure with hypoxia and hypercapnia (HCC) 01/30/2018  . Near syncope 10/09/2017  . HTN (hypertension) 10/09/2017  . Diabetes (HCC) 10/09/2017  . HLD (hyperlipidemia) 10/09/2017  . Chronic diastolic CHF (congestive heart failure) (HCC) 10/09/2017  . Hypothyroidism 10/09/2017  . COPD exacerbation (HCC) 05/13/2016    Past Surgical History:  Procedure Laterality Date  . APPENDECTOMY    . CHOLECYSTECTOMY       OB History   No obstetric history on file.     Family History  Problem Relation Age of Onset  . Heart attack Mother   . Heart attack Father   . Diabetes Sister   . Hypertension Sister   .  Hypertension Brother   . Diabetes Brother   . Lung cancer Brother   . Colon cancer Brother     Social History   Tobacco Use  . Smoking status: Former Games developer  . Smokeless tobacco: Never Used  Substance Use Topics  . Alcohol use: No  . Drug use: No    Home Medications Prior to Admission medications   Medication Sig Start Date End Date Taking? Authorizing Provider  acetaminophen (TYLENOL) 325 MG tablet Take 2 tablets (650 mg total) by mouth every 6 (six) hours as needed for mild pain or headache (fever >/= 101). 05/14/19   Elgergawy, Leana Roe, MD  albuterol (PROVENTIL HFA;VENTOLIN HFA) 108 (90 Base) MCG/ACT inhaler Inhale 2 puffs into the lungs every 6 (six) hours as needed for wheezing or shortness of breath.    [provider]  apixaban (ELIQUIS) 5 MG TABS tablet Take 1 tablet (5 mg total) by mouth 2 (two) times daily. 05/07/19   Tyrone Nine, MD  budesonide-formoterol (SYMBICORT) 80-4.5 MCG/ACT inhaler Inhale 2 puffs into the lungs 2 (two) times daily.    [provider]  calcium carbonate (OSCAL) 1500 (600 Ca) MG TABS tablet Take 600 mg of elemental calcium by mouth 2 (two) times daily with a meal.     [provider]  citalopram (CELEXA) 40 MG tablet Take 40 mg by mouth daily.    [provider]  docusate sodium (COLACE) 100 MG capsule Take 200 mg by mouth every other day.     [provider]  furosemide (LASIX) 20 MG tablet Take 20 mg by mouth daily.    [provider]  gabapentin (NEURONTIN) 100 MG capsule Take 200 mg by mouth 2 (two) times daily.    [provider]  glipiZIDE (GLUCOTROL) 10 MG tablet Take 10 mg by mouth daily before breakfast.    [provider]  levothyroxine (SYNTHROID, LEVOTHROID) 125 MCG tablet Take 125 mcg by mouth daily before breakfast.    [provider]  LORazepam (ATIVAN) 0.5 MG tablet Take 1 tablet (0.5 mg total) by mouth every 8 (eight) hours as needed for anxiety.  05/14/19   Elgergawy, Leana Roe, MD  metFORMIN (GLUCOPHAGE) 500 MG tablet Take 1,000 mg by mouth 2 (two) times daily with a meal.     [provider]  metoprolol tartrate (LOPRESSOR) 25 MG tablet Take 0.5 tablets (12.5 mg total) by mouth 2 (two) times daily. Patient taking differently: Take 12.5 mg by mouth 2 (two) times daily. HOLD for SBP <100 or HR <60 05/20/16   Shaune Pollack, MD  nicotine (NICODERM CQ - DOSED IN MG/24 HOURS) 21 mg/24hr patch Place 1 patch (21 mg total) onto the skin daily. 05/08/19   Tyrone Nine, MD  nystatin (NYSTATIN) powder Apply topically 2 (two) times daily as needed. For fungal growth    [provider]  omeprazole (PRILOSEC) 20 MG capsule Take 20 mg by mouth daily.    [provider]  OXYGEN Inhale 3 L into the lungs continuous.    [provider]  potassium chloride (K-DUR) 10 MEQ tablet Take 10 mEq by mouth daily.    [provider]  predniSONE (STERAPRED UNI-PAK 21 TAB) 10 MG (21) TBPK tablet Use per package instruction 05/14/19   Elgergawy, Leana Roe, MD  prochlorperazine (COMPAZINE) 10 MG tablet Take 10 mg by mouth every 6 (six) hours as needed for nausea or vomiting.    [provider]  rosuvastatin (CRESTOR) 40 MG tablet Take 40 mg by mouth every evening.     [provider]  tiotropium (SPIRIVA) 18 MCG inhalation capsule Place 18 mcg into inhaler and inhale daily.    [provider]    Allergies    Patient has no known allergies.  Review of Systems   Review of Systems  Unable to perform ROS: Dementia  LEVEL 5 CAVIAT  Physical Exam Updated Vital Signs BP (!) 110/53   Pulse (!) 59   Temp 98.1 F (36.7 C)   Resp (!) 23   SpO2 100%   Physical Exam Constitutional:      General: She is not in acute distress.    Appearance: She is not toxic-appearing.  HENT:     Head: Atraumatic.  Cardiovascular:     Rate and Rhythm: Normal rate and regular rhythm.  Pulmonary:     Breath sounds:  Normal breath sounds.  Abdominal:     Palpations: Abdomen is soft.     Tenderness: There is no abdominal tenderness. There is no guarding.     Comments: No masses or apparent blood on DRE  Musculoskeletal:     Right lower leg: No edema.     Left lower leg: No edema.  Skin:    General: Skin is warm and dry.     Capillary Refill: Capillary refill takes less than 2 seconds.     Coloration: Skin  is pale.  Neurological:     General: No focal deficit present.  Psychiatric:        Mood and Affect: Mood normal.     ED Results / Procedures / Treatments   Labs (all labs ordered are listed, but only abnormal results are displayed) Labs Reviewed  CBC WITH DIFFERENTIAL/PLATELET - Abnormal; Notable for the following components:      Result Value   RBC 3.39 (*)    Hemoglobin 7.3 (*)    HCT 28.2 (*)    MCH 21.5 (*)    MCHC 25.9 (*)    RDW 16.2 (*)    All other components within normal limits  COMPREHENSIVE METABOLIC PANEL - Abnormal; Notable for the following components:   Chloride 95 (*)    CO2 36 (*)    Glucose, Bld 155 (*)    Calcium 8.8 (*)    Total Protein 6.4 (*)    Albumin 3.3 (*)    GFR calc non Af Amer 54 (*)    All other components within normal limits  POC OCCULT BLOOD, ED - Abnormal; Notable for the following components:   Fecal Occult Bld POSITIVE (*)    All other components within normal limits  PROTIME-INR  TYPE AND SCREEN    EKG EKG Interpretation  Date/Time:  Thursday July 27 2019 16:59:11 EST Ventricular Rate:  60 PR Interval:    QRS Duration: 154 QT Interval:  457 QTC Calculation: 457 R Axis:   54 Text Interpretation: Sinus rhythm Right bundle branch block No significant change since last tracing Confirmed by Richardean Canal [phone]) on 07/27/2019 5:02:01 PM   Radiology No results found.   Medications Ordered in ED Medications  pantoprazole (PROTONIX) injection 40 mg (40 mg Intravenous Given 07/27/19 1833)    ED Course  I have reviewed the triage  vital signs and the nursing notes.  Pertinent labs & imaging results that were available during my care of the patient were reviewed by me and considered in my medical decision making (see chart for details).  Clinical Course as of Jul 27 1851  Thu Jul 27, 2019  1714 Will hold off on transfusing for now.   Hemoglobin(!): 7.3 [RC]  1715 Comprehensive metabolic panel [RC]  1717 Not currently in atrial fibrillation. RBBB noted.  EKG 12-Lead [RC]  1719 Would expect to be elevated in a large GI bleed making etiology more occult.  BUN: 15 [RC]  1720 Fecal Occult Blood, POC(!): POSITIVE [RC]    Clinical Course User Index [RC] Elige Radon, MD   MDM Rules/Calculators/A&P  79 yo female who presents from [facility] for evaluation of a hemoglobin of 6.8 that was found at the long term care facility.  Patient is a poor historian due to her dementia however upon chart review and SNF documents that I reviewed, it appears she is on eliquis for paroxysmal atrial fibrillation that she developed after developing COVID in November 2020. No clear source of bleeding at this time. Stool occult pending. No concerning findings on DRE. Upon chart review, appears that hgb has been trending down over the past couple of years. No documentation of colonoscopy or other CRC screening. Unfortunately, patient is unable to tell me if she has noted any melena or hematochezia. Given the reset initiation of eliquis in November, I would suspect that she may have a GI bleed however this would not explain the downward trend in her hemoglobin over the past couple of years. Can not rule out malignancy  at this time.  Patient does have a history of atrial fibrillation--CHADVASC 5. On eliquis.  1730 Stool occult positive. Consulting GI for admission vs outpatient follow up.  6:53 PM Dr. Darl Householder discussed with GI. Will admit for hgb trending and further evaluation. Hospitalist group admitting.  Final Clinical Impression(s) / ED  Diagnoses Final diagnoses:  Gastrointestinal hemorrhage, unspecified gastrointestinal hemorrhage type    Rx / DC Orders ED Discharge Orders    None       Mitzi Hansen, MD 07/27/19 1854    Drenda Freeze, MD 07/29/19 1550

## 2019-07-27 NOTE — H&P (Signed)
History and Physical    PLEASE NOTE THAT DRAGON DICTATION SOFTWARE WAS USED IN THE CONSTRUCTION OF THIS NOTE.   Sheila Hayes DZH:[id number] DOB: Jun 19, 1941 DOA: 07/27/2019  PCP: Almetta Lovely, [facility] Patient coming from: [facility]  I have personally briefly reviewed patient's old medical records in [facility] Link  Chief Complaint: low hgb  HPI: Sheila Hayes is a 78 y.o. female with medical history significant for dementia, paroxysmal atrial fibrillation chronically anticoagulated on Eliquis since November '20, chronic diastolic heart failure, COPD, chronic hypoxic respiratory failure on continuous 3 L [place], HTN, DM2, and COVID-19 infection in November '20 prompting hospitalization at that time, chronic anemia with baseline Hgb 9-10, who is admitted to [facility] on 07/27/2019 with acute on chronic anemia in setting of suspected acute lower GI bleed after presenting from [facility] to [facility] Emergency Department for evaluation of low hemoglobin identified on outpatient CBC.   In the setting of cough and shortness of breath, the patient was diagnosed with Covid 19 pneumonia per PCR on 05/02/2019 prompting admission to hospital from 05/08/2019 to 05/14/2019.  Over the course of her hospitalization, her respiratory symptoms resolved, she was subsequently discharged to SNF on 05/14/2019.  During this most recent previous hospitalization, the patient was diagnosed with paroxysmal atrial fibrillation, representing a new diagnosis for her, and was subsequently started on Eliquis for associated thromboembolic prophylaxis.  She remains on Eliquis at this time, and is otherwise on no blood thinning agents as an outpatient.  Per chart review, it appears that the patient has a history of chronic anemia with baseline hemoglobin of 9-10.  Per the final hemoglobin data point procured during most recent hospitalization on 05/12/2019, hemoglobin was noted to be 7.7.   For the purpose of monitoring patient's chronic anemia in the setting of anticoagulation, repeat CBC was ordered as an outpatient, with associated hemoglobin performed this morning found to be slightly lower at 7.3, prompting the patient to be brought to [facility] emergency department for further evaluation.  The patient denies any associated abdominal pain, nausea, vomiting, diarrhea, or rash.  She is unsure if she has been experiencing any recent melena or hematochezia, although SNF staff reportedly conveyed that the patient's stools have appeared darker over the last few weeks in the absence of hematochezia.  The patient denies any recent chest pain, shortness of breath, palpitations, diaphoresis, dizziness, presyncope, or syncope.  She confirms that her respiratory symptoms that she was experiencing at the time of her COVID-19 diagnosis in November 2020 remain completely resolved.  She denies any recent subjective fever, chills, rigors, or generalized myalgias.  Denies any recent headache, neck stiffness, rhinitis, rhinorrhea, sore throat.  No recent trauma.  No known history of liver disease, and the patient denies any history of alcohol abuse.  She is unsure as to the timing of her most recent prior endoscopic evaluation, but she does not believe that she is a history of prior gastrointestinal bleed leading up to recent initiation of Eliquis.      ED Course:  Vital signs in the ED were notable for the following: Temperature max 98.1; heart rate 58-61; blood pressure 110/53-119/58; respiratory rate 19-23, and oxygen saturation 100% on slide 3 L continuous supplemental O2.  Labs were notable for the following: CMP notable for sodium 142, bicarbonate 15 compared to most recent prior BUN value of 22 on 05/12/2019, creatinine 1.0 relative to most recent prior creatinine data point of 0.78 on 05/12/2019,  and liver enzymes were found to be within normal limits.  CBC notable for hemoglobin 7.3 compared to  most recent prior value of 7.7 on 05/12/2019, MCV 83.2, MCHC 25.9, and RDW 16.2; platelets 219.  INR 1.1.  Fecal occult blood found to be positive.  The patient's case was discussed with the on-call gastroenterologist, Dr. Adela LankArmbruster, who recommended that the patient be n.p.o. overnight, with interval monitoring of serial H&H's, initiation of IV Protonix on a twice daily basis, with plan for Dr. Adela LankArmbruster to see the patient in the hospital in the morning.   While in the ED, the following were administered: Protonix 40 mg IV x1.     Review of Systems: As per HPI otherwise 10 point review of systems negative.   Past Medical History:  Diagnosis Date  . Chronic diastolic congestive heart failure (HCC)   . COPD (chronic obstructive pulmonary disease) (HCC)   . Diabetes mellitus without complication (HCC)   . Hyperlipidemia   . Hypertension   . Hypothyroidism   . Morbid obesity (HCC)   . Pulmonary hypertension (HCC)     Past Surgical History:  Procedure Laterality Date  . APPENDECTOMY    . CHOLECYSTECTOMY      Social History:  reports that she has quit smoking. She has never used smokeless tobacco. She reports that she does not drink alcohol or use drugs.   No Known Allergies  Family History  Problem Relation Age of Onset  . Heart attack Mother   . Heart attack Father   . Diabetes Sister   . Hypertension Sister   . Hypertension Brother   . Diabetes Brother   . Lung cancer Brother   . Colon cancer Brother      Prior to Admission medications   Medication Sig Start Date End Date Taking? Authorizing Provider  acetaminophen (TYLENOL) 325 MG tablet Take 2 tablets (650 mg total) by mouth every 6 (six) hours as needed for mild pain or headache (fever >/= 101). 05/14/19   Elgergawy, Leana Roeawood S, MD  albuterol (PROVENTIL HFA;VENTOLIN HFA) 108 (90 Base) MCG/ACT inhaler Inhale 2 puffs into the lungs every 6 (six) hours as needed for wheezing or shortness of breath.    [provider]  apixaban (ELIQUIS) 5 MG TABS tablet Take 1 tablet (5 mg total) by mouth 2 (two) times daily. 05/07/19   Tyrone NineGrunz, Ryan B, MD  budesonide-formoterol (SYMBICORT) 80-4.5 MCG/ACT inhaler Inhale 2 puffs into the lungs 2 (two) times daily.    [provider]  calcium carbonate (OSCAL) 1500 (600 Ca) MG TABS tablet Take 600 mg of elemental calcium by mouth 2 (two) times daily with a meal.     [provider]  citalopram (CELEXA) 40 MG tablet Take 40 mg by mouth daily.    [provider]  docusate sodium (COLACE) 100 MG capsule Take 200 mg by mouth every other day.     [provider]  furosemide (LASIX) 20 MG tablet Take 20 mg by mouth daily.    [provider]  gabapentin (NEURONTIN) 100 MG capsule Take 200 mg by mouth 2 (two) times daily.    [provider]  glipiZIDE (GLUCOTROL) 10 MG tablet Take 10 mg by mouth daily before breakfast.    [provider]  levothyroxine (SYNTHROID, LEVOTHROID) 125 MCG tablet Take 125 mcg by mouth daily before breakfast.    [provider]  LORazepam (ATIVAN) 0.5 MG tablet Take 1 tablet (0.5 mg total) by mouth every 8 (eight)  hours as needed for anxiety. 05/14/19   Elgergawy, Leana Roe, MD  metFORMIN (GLUCOPHAGE) 500 MG tablet Take 1,000 mg by mouth 2 (two) times daily with a meal.     [provider]  metoprolol tartrate (LOPRESSOR) 25 MG tablet Take 0.5 tablets (12.5 mg total) by mouth 2 (two) times daily. Patient taking differently: Take 12.5 mg by mouth 2 (two) times daily. HOLD for SBP <100 or HR <60 05/20/16   Shaune Pollack, MD  nicotine (NICODERM CQ - DOSED IN MG/24 HOURS) 21 mg/24hr patch Place 1 patch (21 mg total) onto the skin daily. 05/08/19   Tyrone Nine, MD  nystatin (NYSTATIN) powder Apply topically 2 (two) times daily as needed. For fungal growth    [provider]  omeprazole (PRILOSEC) 20 MG capsule Take 20 mg by mouth daily.    [provider]    OXYGEN Inhale 3 L into the lungs continuous.    [provider]  potassium chloride (K-DUR) 10 MEQ tablet Take 10 mEq by mouth daily.    [provider]  predniSONE (STERAPRED UNI-PAK 21 TAB) 10 MG (21) TBPK tablet Use per package instruction 05/14/19   Elgergawy, Leana Roe, MD  prochlorperazine (COMPAZINE) 10 MG tablet Take 10 mg by mouth every 6 (six) hours as needed for nausea or vomiting.    [provider]  rosuvastatin (CRESTOR) 40 MG tablet Take 40 mg by mouth every evening.     [provider]  tiotropium (SPIRIVA) 18 MCG inhalation capsule Place 18 mcg into inhaler and inhale daily.    [provider]     Objective    Physical Exam: Vitals:   07/27/19 1627 07/27/19 1630 07/27/19 1700 07/27/19 1730  BP:  116/62 (!) 112/55 (!) 110/53  Pulse: 61 (!) 58 (!) 59 (!) 59  Resp: 19 (!) 23 (!) 23 (!) 23  Temp:      SpO2: 100% 100% 100% 100%    General: appears to be stated age; alert Skin: warm, dry, no rash Head:  AT/[place] Eyes:  PEARL b/l, EOMI Mouth:  Oral mucosa membranes appear moist, normal dentition Neck: supple; trachea midline Heart:  RRR; did not appreciate any M/R/G Lungs: CTAB, did not appreciate any wheezes, rales, or rhonchi Abdomen: + BS; soft, ND, NT Vascular: 2+ pedal pulses b/l; 2+ radial pulses b/l Extremities: no peripheral edema, no muscle wasting   Labs on Admission: I have personally reviewed following labs and imaging studies  CBC: Recent Labs  Lab 07/27/19 1640  WBC 5.1  NEUTROABS 3.5  HGB 7.3*  HCT 28.2*  MCV 83.2  PLT 219   Basic Metabolic Panel: Recent Labs  Lab 07/27/19 1640  NA 142  K 3.6  CL 95*  CO2 36*  GLUCOSE 155*  BUN 15  CREATININE 1.00  CALCIUM 8.8*   GFR: CrCl cannot be calculated (Unknown ideal weight.). Liver Function Tests: Recent Labs  Lab 07/27/19 1640  AST 17  ALT 13  ALKPHOS 40  BILITOT 0.8  PROT 6.4*  ALBUMIN 3.3*   No results for input(s): LIPASE, AMYLASE  in the last 168 hours. No results for input(s): AMMONIA in the last 168 hours. Coagulation Profile: Recent Labs  Lab 07/27/19 1640  INR 1.1   Cardiac Enzymes: No results for input(s): CKTOTAL, CKMB, CKMBINDEX, TROPONINI in the last 168 hours. BNP (last 3 results) No results for input(s): PROBNP in the last 8760 hours. HbA1C: No results for input(s): HGBA1C in the last 72 hours. CBG:  No results for input(s): GLUCAP in the last 168 hours. Lipid Profile: No results for input(s): CHOL, HDL, LDLCALC, TRIG, CHOLHDL, LDLDIRECT in the last 72 hours. Thyroid Function Tests: No results for input(s): TSH, T4TOTAL, FREET4, T3FREE, THYROIDAB in the last 72 hours. Anemia Panel: No results for input(s): VITAMINB12, FOLATE, FERRITIN, TIBC, IRON, RETICCTPCT in the last 72 hours. Urine analysis:    Component Value Date/Time   COLORURINE YELLOW 05/09/2019 0519   APPEARANCEUR CLEAR 05/09/2019 0519   APPEARANCEUR Clear 07/24/2013 1152   LABSPEC >1.046 (H) 05/09/2019 0519   LABSPEC 1.010 07/24/2013 1152   PHURINE 5.0 05/09/2019 0519   GLUCOSEU >=500 (A) 05/09/2019 0519   GLUCOSEU Negative 07/24/2013 1152   HGBUR NEGATIVE 05/09/2019 0519   BILIRUBINUR NEGATIVE 05/09/2019 0519   BILIRUBINUR Negative 07/24/2013 1152   KETONESUR 5 (A) 05/09/2019 0519   PROTEINUR NEGATIVE 05/09/2019 0519   NITRITE NEGATIVE 05/09/2019 0519   LEUKOCYTESUR NEGATIVE 05/09/2019 0519   LEUKOCYTESUR Negative 07/24/2013 1152    Radiological Exams on Admission: No results found.   Assessment/Plan   Sheila Hayes is a 79 y.o. female with medical history significant for dementia, paroxysmal atrial fibrillation chronically anticoagulated on Eliquis since November '20, chronic diastolic heart failure, COPD, chronic hypoxic respiratory failure on continuous 3 L [place], HTN, DM2, and COVID-19 infection in November '20 prompting hospitalization at that time, chronic anemia with baseline Hgb 9-10, who is admitted to [facility] on 07/27/2019 with acute on chronic anemia in setting of suspected acute lower GI bleed after presenting from [facility] to [facility] Emergency Department for evaluation of low hemoglobin identified on outpatient CBC.    Principal Problem:   Acute lower GI bleeding Active Problems:   HTN (hypertension)   Diabetes (HCC)   Chronic diastolic CHF (congestive heart failure) (HCC)   Hypothyroidism   Acute on chronic anemia   #) Acute on chronic anemia: In the setting of a history of chronic normocytic anemia, potentially on the basis of anemia of chronic disease, with baseline hemoglobin noted to be 9-10, presenting hemoglobin noted to be slightly lower than the systolic of baseline with presenting value of 7.3 in the negative Hemoccult stool positive finding. Of note, most recent prior hemoglobin was found to be 7.7 on 05/12/2019, raising the possibility of a slow gastrointestinal bleed following initiation of Eliquis for preceding new diagnosis of atrial fibrillation.  Nonelevated, and in fact decreasing, BUN is suggestive of lower gastrointestinal source for suspected bleed.  In the context of baseline dementia, the patient reports that she is completely asymptomatic, and she appears hemodynamically stable per review of vital signs in the emergency department thus far. With a hemoglobin of 7.3, in the absence of symptoms, and the appearance of hemodynamic stability, there does not appear to be an indication to initiate transfusion at this time.  The patient's case was discussed with the on-call gastroenterologist, Dr. Adela Lank, who recommended that the patient be n.p.o. overnight, with interval monitoring of serial H&H's, initiation of IV Protonix on a twice daily basis, with plan for Dr. Adela Lank to see the patient in the hospital in the morning.   Additionally, expand anemia work-up to include evaluation for the chronic component of her anemia given that I have not yet  encountered a formal etiology time to her chronic anemia, although I suspect a degree of chronic disease as a major contributing factor to this.    Plan: Type screen and hold 2 units PRBC.  Every 4  hours H&H's have been ordered through 5 AM tomorrow (07/28/19).  Monitor on telemetry.  Protonix 40 mg IV twice daily per gastroenterology recommendations.  NPO.  Gastroenterology consulted and planning to see patient in the morning.  will hold this evening's dose of Eliquis.  Monitor continuous pulse oximetry check iron studies to evaluate if patient would benefit from administration of IV iron.  We will also check reticulocyte count folic acid level and B12 level.  Check peripheral smear.  SCDs.  Check INR.     #) Acute lower gastrointestinal bleed: Suspected on the basis of gradually declining hemoglobin value, with Hemoccult stool positive finding this evening, and corresponding declining BUN value suggestive of a slow lower gastrointestinal source, with increased risk of bleed stemming from recently initiated Eliquis, as further described above.  Patient is currently asymptomatic and appears hemodynamically stable.  Case was discussed with gastroenterology, as above, with plan to be seen by GI in the morning.   Plan: Per gastroenterology recommendations, patient has been made n.p.o. and serial H&H's have been ordered through 5 AM tomorrow morning.  Protonix 40 mg IV twice daily.  Monitor on telemetry.  To be seen by gastroenterology on the morning of 07/28/2019.      #) Chronic diastolic heart failure: Documented history of such, with outpatient diuretic regimen noted consist of Lasix 20 mg p.o. daily.  Evidence to suggest acutely decompensated heart failure at this time, although caution would be warranted and monitoring for development of volume overload patient subsequently warrant blood transfusion.  Plan: Hold home Lasix for now.  Monitor strict I's and O's and daily weights.  Repeat BMP in the  morning.  We will also attempt additional chart review to evaluate most recent echocardiogram findings.      #) Paroxysmal atrial fibrillation: Documented history of such, with diagnosis of such occurring during November 2020 hospitalization, as further described above. In the setting of a CHA2DS2-VASc score of 5, there is an indication for the patient to be on chronic anticoagulation for thromboembolic prophylaxis. Consistent with this, the patient is chronically anticoagulated on Eliquis. Home AV nodal blocking regimen: Lopressor 12.5 mg p.o. twice daily.  Appears to be in normal sinus rhythm at this time.  In the setting of presenting suspected acute lower gastrointestinal bleed, will hold this evening's dose of Eliquis, as further described above   Plan: monitor strict I's & O's and daily weights. Repeat BMP in the morning. Check serum magnesium level. Hold home AV nodal blocking regimen, as above.  Holding this evening's dose Eliquis, as above.  Monitor on telemetry in the setting of suspected acute lower gastrointestinal bleed.      #) Type 2 diabetes mellitus: Most recent hemoglobin A1c noted to be 5.4% when checked on 05/02/2019.  Noninsulin dependent.  Outpatient oral hypoglycemic regimen consists of glipizide and Metformin.  Presenting blood sugar this evening noted to be 155.   Plan: Hold outpatient oral hypoglycemic agents during this hospitalization.  Accu-Cheks every 6 hours with low-dose sliding scale insulin.      #) Recent COVID-19 infection: Tested positive for COVID-19 per positive PCR result on 05/02/2019 leading to 7-day hospitalization for COVID-19 pneumonia, as further detailed above.  It appears that all of the patient's respiratory symptoms that existed at the time of this diagnosis have subsequently resolved and the patient is maintaining oxygen saturations in the high 90s on her baseline 3 L continuous supplemental oxygen.  As the patient possesses a positive COVID-19  test within the  last 90 days, there is no indication to repeat COVID-19 testing at this time.  Additionally, as more than 21 days have elapsed following positive COVID-19 test on 05/02/2019, there is no indication for airborne/contact precautions with eye protection at this time.   Plan: Refrain from additional COVID-19 testing at this time as well as refrain from airborne/contact precautions, per quantitative rationale as above.  Repeat CBC in the morning.    DVT prophylaxis: scd's  Code Status: DNR/DNI Family Communication: None Disposition Plan: Per Rounding Team Consults called: case discussed with on-call GI, Dr. Havery Moros, as described above Admission status: Observation; med telemetry.    PLEASE NOTE THAT DRAGON DICTATION SOFTWARE WAS USED IN THE CONSTRUCTION OF THIS NOTE.   [facility] Pager [phone] From [address].   Otherwise, please contact night-coverage  [email or web address] Password [email or web address]  07/27/2019, 6:18 PM

## 2019-07-27 NOTE — ED Triage Notes (Signed)
Pt BIB [facility] from [facility]. Facility reports pt has 6.8 Hbg with morning labs and was sent for blood transfusion. Pt denies any complaints at this time.

## 2019-07-27 NOTE — ED Notes (Signed)
Pt wears 3L [place] at all times.

## 2019-07-28 ENCOUNTER — Encounter (HOSPITAL_COMMUNITY)
Admission: EM | Disposition: A | Discharge status: Skilled Nursing Facility | Payer: Self-pay | Source: Skilled Nursing Facility | Attending: Internal Medicine

## 2019-07-28 ENCOUNTER — Observation Stay (HOSPITAL_COMMUNITY): Payer: Medicare Other | Admitting: Certified Registered Nurse Anesthetist

## 2019-07-28 ENCOUNTER — Observation Stay (HOSPITAL_COMMUNITY): Payer: Medicare Other

## 2019-07-28 ENCOUNTER — Encounter (HOSPITAL_COMMUNITY): Payer: Self-pay | Admitting: Internal Medicine

## 2019-07-28 DIAGNOSIS — Z66 Do not resuscitate: Secondary | ICD-10-CM | POA: Diagnosis present

## 2019-07-28 DIAGNOSIS — E039 Hypothyroidism, unspecified: Secondary | ICD-10-CM | POA: Diagnosis present

## 2019-07-28 DIAGNOSIS — I10 Essential (primary) hypertension: Secondary | ICD-10-CM | POA: Diagnosis not present

## 2019-07-28 DIAGNOSIS — I272 Pulmonary hypertension, unspecified: Secondary | ICD-10-CM | POA: Diagnosis present

## 2019-07-28 DIAGNOSIS — K317 Polyp of stomach and duodenum: Secondary | ICD-10-CM | POA: Diagnosis present

## 2019-07-28 DIAGNOSIS — I5032 Chronic diastolic (congestive) heart failure: Secondary | ICD-10-CM | POA: Diagnosis present

## 2019-07-28 DIAGNOSIS — D62 Acute posthemorrhagic anemia: Secondary | ICD-10-CM | POA: Diagnosis present

## 2019-07-28 DIAGNOSIS — D649 Anemia, unspecified: Secondary | ICD-10-CM | POA: Diagnosis present

## 2019-07-28 DIAGNOSIS — Z833 Family history of diabetes mellitus: Secondary | ICD-10-CM | POA: Diagnosis not present

## 2019-07-28 DIAGNOSIS — I480 Paroxysmal atrial fibrillation: Secondary | ICD-10-CM | POA: Diagnosis present

## 2019-07-28 DIAGNOSIS — J9611 Chronic respiratory failure with hypoxia: Secondary | ICD-10-CM | POA: Diagnosis present

## 2019-07-28 DIAGNOSIS — I110 Hypertensive heart disease with heart failure: Secondary | ICD-10-CM | POA: Diagnosis present

## 2019-07-28 DIAGNOSIS — D500 Iron deficiency anemia secondary to blood loss (chronic): Secondary | ICD-10-CM | POA: Diagnosis not present

## 2019-07-28 DIAGNOSIS — E119 Type 2 diabetes mellitus without complications: Secondary | ICD-10-CM | POA: Diagnosis present

## 2019-07-28 DIAGNOSIS — K254 Chronic or unspecified gastric ulcer with hemorrhage: Secondary | ICD-10-CM | POA: Diagnosis present

## 2019-07-28 DIAGNOSIS — Z6835 Body mass index (BMI) 35.0-35.9, adult: Secondary | ICD-10-CM | POA: Diagnosis not present

## 2019-07-28 DIAGNOSIS — Z800 Family history of malignant neoplasm of digestive organs: Secondary | ICD-10-CM | POA: Diagnosis not present

## 2019-07-28 DIAGNOSIS — Z8616 Personal history of COVID-19: Secondary | ICD-10-CM | POA: Diagnosis not present

## 2019-07-28 DIAGNOSIS — Z7901 Long term (current) use of anticoagulants: Secondary | ICD-10-CM | POA: Diagnosis not present

## 2019-07-28 DIAGNOSIS — Z9049 Acquired absence of other specified parts of digestive tract: Secondary | ICD-10-CM | POA: Diagnosis not present

## 2019-07-28 DIAGNOSIS — K922 Gastrointestinal hemorrhage, unspecified: Secondary | ICD-10-CM | POA: Diagnosis present

## 2019-07-28 DIAGNOSIS — J449 Chronic obstructive pulmonary disease, unspecified: Secondary | ICD-10-CM | POA: Diagnosis present

## 2019-07-28 DIAGNOSIS — Z801 Family history of malignant neoplasm of trachea, bronchus and lung: Secondary | ICD-10-CM | POA: Diagnosis not present

## 2019-07-28 DIAGNOSIS — Z9981 Dependence on supplemental oxygen: Secondary | ICD-10-CM | POA: Diagnosis not present

## 2019-07-28 DIAGNOSIS — E785 Hyperlipidemia, unspecified: Secondary | ICD-10-CM | POA: Diagnosis present

## 2019-07-28 DIAGNOSIS — Z8249 Family history of ischemic heart disease and other diseases of the circulatory system: Secondary | ICD-10-CM | POA: Diagnosis not present

## 2019-07-28 DIAGNOSIS — Z87891 Personal history of nicotine dependence: Secondary | ICD-10-CM | POA: Diagnosis not present

## 2019-07-28 HISTORY — PX: ESOPHAGOGASTRODUODENOSCOPY: SHX5428

## 2019-07-28 HISTORY — PX: BIOPSY: SHX5522

## 2019-07-28 HISTORY — PX: SUBMUCOSAL LIFTING INJECTION: SHX6855

## 2019-07-28 HISTORY — PX: SUBMUCOSAL INJECTION: SHX5543

## 2019-07-28 HISTORY — PX: ENDOSCOPIC MUCOSAL RESECTION: SHX6839

## 2019-07-28 HISTORY — PX: HEMOSTASIS CLIP PLACEMENT: SHX6857

## 2019-07-28 LAB — GLUCOSE, CAPILLARY
Glucose-Capillary: 92 mg/dL (ref 70–99)
Glucose-Capillary: 100 mg/dL — ABNORMAL HIGH (ref 70–99)
Glucose-Capillary: 96 mg/dL (ref 70–99)
Glucose-Capillary: 110 mg/dL — ABNORMAL HIGH (ref 70–99)
Glucose-Capillary: 137 mg/dL — ABNORMAL HIGH (ref 70–99)

## 2019-07-28 LAB — CBC
HCT: 26.5 % — ABNORMAL LOW (ref 36.0–46.0)
Hemoglobin: 7 g/dL — ABNORMAL LOW (ref 12.0–15.0)
MCH: 21.8 pg — ABNORMAL LOW (ref 26.0–34.0)
MCHC: 26.4 g/dL — ABNORMAL LOW (ref 30.0–36.0)
MCV: 82.6 fL (ref 80.0–100.0)
Platelets: 218 10*3/uL (ref 150–400)
RBC: 3.21 MIL/uL — ABNORMAL LOW (ref 3.87–5.11)
RDW: 16 % — ABNORMAL HIGH (ref 11.5–15.5)
WBC: 5.3 10*3/uL (ref 4.0–10.5)
nRBC: 0 % (ref 0.0–0.2)

## 2019-07-28 LAB — COMPREHENSIVE METABOLIC PANEL
ALT: 11 U/L (ref 0–44)
AST: 14 U/L — ABNORMAL LOW (ref 15–41)
Albumin: 3.2 g/dL — ABNORMAL LOW (ref 3.5–5.0)
Alkaline Phosphatase: 37 U/L — ABNORMAL LOW (ref 38–126)
Anion gap: 9 (ref 5–15)
BUN: 13 mg/dL (ref 8–23)
CO2: 36 mmol/L — ABNORMAL HIGH (ref 22–32)
Calcium: 8.8 mg/dL — ABNORMAL LOW (ref 8.9–10.3)
Chloride: 97 mmol/L — ABNORMAL LOW (ref 98–111)
Creatinine, Ser: 0.87 mg/dL (ref 0.44–1.00)
GFR calc Af Amer: >60 mL/min (ref >60)
GFR calc non Af Amer: >60 mL/min (ref >60)
Glucose, Bld: 99 mg/dL (ref 70–99)
Potassium: 3.9 mmol/L (ref 3.5–5.1)
Sodium: 142 mmol/L (ref 135–145)
Total Bilirubin: 0.9 mg/dL (ref 0.3–1.2)
Total Protein: 5.8 g/dL — ABNORMAL LOW (ref 6.5–8.1)

## 2019-07-28 LAB — PROTIME-INR
INR: 1.1 (ref 0.8–1.2)
Prothrombin Time: 14.2 seconds (ref 11.4–15.2)

## 2019-07-28 LAB — PREPARE RBC (CROSSMATCH)

## 2019-07-28 LAB — SARS CORONAVIRUS 2 (TAT 6-24 HRS): SARS Coronavirus 2: NEGATIVE

## 2019-07-28 LAB — MRSA PCR SCREENING: MRSA by PCR: POSITIVE — AB

## 2019-07-28 LAB — MAGNESIUM: Magnesium: 1.9 mg/dL (ref 1.7–2.4)

## 2019-07-28 LAB — HEMOGLOBIN AND HEMATOCRIT, BLOOD
HCT: 32.9 % — ABNORMAL LOW (ref 36.0–46.0)
Hemoglobin: 9.3 g/dL — ABNORMAL LOW (ref 12.0–15.0)

## 2019-07-28 LAB — PATHOLOGIST SMEAR REVIEW

## 2019-07-28 SURGERY — EGD (ESOPHAGOGASTRODUODENOSCOPY)
Anesthesia: Monitor Anesthesia Care

## 2019-07-28 MED ORDER — FENTANYL CITRATE (PF) 100 MCG/2ML IJ SOLN
INTRAMUSCULAR | Status: AC
  Filled 2019-07-28: qty 2

## 2019-07-28 MED ORDER — LACTATED RINGERS IV SOLN: INTRAVENOUS | Status: DC | PRN

## 2019-07-28 MED ORDER — SODIUM CHLORIDE 0.9% IV SOLUTION: Freq: Once | INTRAVENOUS | Status: DC

## 2019-07-28 MED ORDER — PROPOFOL 500 MG/50ML IV EMUL
INTRAVENOUS | Status: DC | PRN
  Administered 2019-07-28: 40 ug/kg/min via INTRAVENOUS

## 2019-07-28 MED ORDER — FENTANYL CITRATE (PF) 100 MCG/2ML IJ SOLN
12.5000 ug | Freq: Once | INTRAMUSCULAR | Status: AC
  Administered 2019-07-28: 14:00:00 12.5 ug via INTRAVENOUS

## 2019-07-28 MED ORDER — SODIUM CHLORIDE 0.9 % IV SOLN: INTRAVENOUS | Status: DC

## 2019-07-28 MED ORDER — PHENYLEPHRINE 40 MCG/ML (10ML) SYRINGE FOR IV PUSH (FOR BLOOD PRESSURE SUPPORT)
PREFILLED_SYRINGE | INTRAVENOUS | Status: DC | PRN
  Administered 2019-07-28: 80 ug via INTRAVENOUS

## 2019-07-28 MED ORDER — LACTATED RINGERS IV SOLN: INTRAVENOUS | Status: DC

## 2019-07-28 MED ORDER — SODIUM CHLORIDE (PF) 0.9 % IJ SOLN
PREFILLED_SYRINGE | INTRAMUSCULAR | Status: DC | PRN
  Administered 2019-07-28: 3 mL

## 2019-07-28 MED ORDER — PROPOFOL 10 MG/ML IV BOLUS
INTRAVENOUS | Status: DC | PRN
  Administered 2019-07-28 (×6): 10 mg via INTRAVENOUS

## 2019-07-28 MED ORDER — ONDANSETRON HCL 4 MG/2ML IJ SOLN
INTRAMUSCULAR | Status: AC
  Filled 2019-07-28: qty 2

## 2019-07-28 NOTE — Interval H&P Note (Signed)
History and Physical Interval Note:  07/28/2019 12:51 PM  Sheila Hayes  has presented today for surgery, with the diagnosis of Anemia & Anticoagulation.  The various methods of treatment have been discussed with the patient and family. After consideration of risks, benefits and other options for treatment, the patient has consented to  Procedure(s): ESOPHAGOGASTRODUODENOSCOPY (EGD) (N/A) as a surgical intervention.  The patient's history has been reviewed, patient examined, no change in status, stable for surgery.  I have reviewed the patient's chart and labs.  Questions were answered to the patient's family satisfaction.     [employer]

## 2019-07-28 NOTE — Progress Notes (Signed)
1410 patient c/o pain 8/10 epigastric, MD notified.  Patient had submucosal injection of epi during procedure.  Verbal order for iv fentany 12.5mg  stat see MAR, portable chest xray and kub.  MD at bedside post xray to view films.  Patient c/o nausea with clear/yellow emesis, vo for 4mg  iv zofran, see MAR.  1450 patient resting comfortably in semifowlers, alert and no compliants.

## 2019-07-28 NOTE — H&P (View-Only) (Signed)
Referring Provider: Dr. Mitzi Hansen  Primary Care Physician:  [facility] Primary Gastroenterologist:  Althia Forts   Reason for Consultation:  Anemia, + FOBT  HPI: Sheila Hayes is a 79 y.o. female with a past medical history of hypertension, paroxysmal atrial fibrillation on Eliquis, CHF, pulmonary hypertension, DM II, hypothyroidism, MRSA bacteremia and dementia. COVID 19 positive 04/2019.  Past tonsillectomy, appendectomy and cholecystectomy.  Due to her history of dementia, her past medical history was collected from her medical records.  She resides at [facility].  In review of her records, she was admitted to the [facility] hospital 05/01/2019 with acute respiratory failure secondary to COVID 19. She was treated with Remdesivir and steroids. She developed atrial fibrillation treated with Metoprolol and Lovenox which was switched to Eliquis prior to discharge to SNF.She was discharged to the SNF on 11/15 then back at [facility] ED 11/16 with acute on chronic respiratory failure. She received steroids. CT showed a right middle lobe infiltrate suggestive of acute aspiration, less likely bacteria pneumonia.  She received IV Unasyn, Symbicort/Spiriva and Albuterol HFA were continued. Urine cultures + Citrobacter. She was discharged to the SNF on 05/14/2019. Palliative care was initiated.  She remains on Eliquis since her earlier November hospital admission with the onset of atrial fibrillation.  She underwent laboratory studies 07/27/2019 at the SNF which identified a hemoglobin of 6.8.  She did not exhibit any signs of active GI bleeding. She was sent to [facility] ED for further evaluation.  In the ED, hemoglobin level was 7.3 (Hemoglobin 7.7 on the day of her hospital discharge 05/12/2019).  FOBT was positive.  GI consult was requested for further evaluation.   She was resting comfortably this morning.  She awakens easily to name.  She does not know the reason  why she is in the hospital.  She denies having any upper or lower abdominal pain.  No nausea or vomiting.  No dysphagia or heartburn.  She reports passing grayish colored formed stools.  She denies seeing any obvious bright red blood from the rectum or black stools.  No chest pain or shortness of breath.  No dysuria or hematuria.  He does not know when she last took Eliquis. She denies taking any ASA. It is unknown if she is ever had an EGD or colonoscopy.  She is hemodynamically stable.  No family at the bedside.  ED course: Na 142. K 3.6. CO2 36. Glu 155. BUN 15. Cr. 1.00. Ca 8.8. Anion gap 11. Mg 1.7. Alk phos 40. AST 17. ALT 13. T. Bili 0.8. Iron 27. TIBC 403. Ferritin 9. Folate 10.7. WBC 5.1. Hg 7.3 (Hg 7.7 on 05/12/2019). HCT 28.2. MCV 83.2. PLT 219. Retic ct 55.4. INR 1.1.  SARS Coronairus  2 negative. FOBT +.  I Past Medical History:  Diagnosis Date  . Chronic diastolic congestive heart failure ([place])   . COPD (chronic obstructive pulmonary disease) ([place])   . Diabetes mellitus without complication ([place])   . Hyperlipidemia   . Hypertension   . Hypothyroidism   . Morbid obesity ([place])   . Paroxysmal A-fib ([place])   . Pulmonary hypertension ([place])     Past Surgical History:  Procedure Laterality Date  . APPENDECTOMY    . CHOLECYSTECTOMY      Prior to Admission medications   Medication Sig Start Date End Date Taking? Authorizing Provider  acetaminophen (TYLENOL) 325 MG tablet Take 2 tablets (650 mg total) by mouth every 6 (six) hours  as needed for mild pain or headache (fever >/= 101). 05/14/19  Yes Elgergawy, Dawood S, MD  albuterol (PROVENTIL HFA;VENTOLIN HFA) 108 (90 Base) MCG/ACT inhaler Inhale 2 puffs into the lungs every 6 (six) hours as needed for wheezing or shortness of breath.   Yes [provider]  apixaban (ELIQUIS) 5 MG TABS tablet Take 1 tablet (5 mg total) by mouth 2 (two) times daily. 05/07/19  Yes Grunz, Ryan B, MD  budesonide-formoterol (SYMBICORT) 80-4.5  MCG/ACT inhaler Inhale 2 puffs into the lungs 2 (two) times daily.   Yes [provider]  calcium carbonate (OSCAL) 1500 (600 Ca) MG TABS tablet Take 600 mg of elemental calcium by mouth 2 (two) times daily with a meal.    Yes [provider]  docusate sodium (COLACE) 100 MG capsule Take 200 mg by mouth every other day.    Yes [provider]  furosemide (LASIX) 20 MG tablet Take 20 mg by mouth daily.   Yes [provider]  gabapentin (NEURONTIN) 100 MG capsule Take 200 mg by mouth 2 (two) times daily.   Yes [provider]  insulin lispro (HUMALOG) 100 UNIT/ML injection Inject 0-8 Units into the skin 3 (three) times daily before meals. 0-200= 0 units 201-250=2 units 251-300=4 units 301-350=6 units 351-400=8 units   Yes [provider]  levothyroxine (SYNTHROID, LEVOTHROID) 125 MCG tablet Take 125 mcg by mouth daily before breakfast.   Yes [provider]  metoprolol succinate (TOPROL-XL) 25 MG 24 hr tablet Take 12.5 mg by mouth daily.   Yes [provider]  nicotine (NICODERM CQ - DOSED IN MG/24 HOURS) 14 mg/24hr patch Place 14 mg onto the skin daily.   Yes [provider]  nystatin (NYSTATIN) powder Apply topically 2 (two) times daily as needed. For fungal growth   Yes [provider]  omeprazole (PRILOSEC) 20 MG capsule Take 20 mg by mouth daily.   Yes [provider]  potassium chloride (K-DUR) 10 MEQ tablet Take 10 mEq by mouth daily.   Yes [provider]  rosuvastatin (CRESTOR) 40 MG tablet Take 20 mg by mouth every evening.    Yes [provider]  tiotropium (SPIRIVA) 18 MCG inhalation capsule Place 18 mcg into inhaler and inhale daily.   Yes [provider]  OXYGEN Inhale 3 L into the lungs continuous.    [provider]    Current Facility-Administered Medications  Medication Dose Route Frequency Provider Last Rate Last Admin  . acetaminophen  (TYLENOL) tablet 650 mg  650 mg Oral Q6H PRN Howerter, Justin B, DO       Or  . acetaminophen (TYLENOL) suppository 650 mg  650 mg Rectal Q6H PRN Howerter, Justin B, DO      . albuterol (PROVENTIL) (2.5 MG/3ML) 0.083% nebulizer solution 3 mL  3 mL Inhalation Q4H PRN Howerter, Justin B, DO      . insulin aspart (novoLOG) injection 0-9 Units  0-9 Units Subcutaneous Q6H Howerter, Justin B, DO      . mometasone-formoterol (DULERA) 100-5 MCG/ACT inhaler 2 puff  2 puff Inhalation BID Howerter, Justin B, DO   Stopped at 07/27/19 1954  . ondansetron (ZOFRAN) injection 4 mg  4 mg Intravenous Q6H PRN Howerter, Justin B, DO      . pantoprazole (PROTONIX) injection 40 mg  40 mg Intravenous Q12H Howerter, Justin B, DO      . sodium chloride flush (NS) 0.9 % injection 3 mL  3 mL Intravenous Q12H Howerter,   Justin B, DO   3 mL at 07/27/19 2200  . umeclidinium bromide (INCRUSE ELLIPTA) 62.5 MCG/INH 1 puff  1 puff Inhalation Daily Howerter, Justin B, DO        Allergies as of 07/27/2019  . (No Known Allergies)    Family History  Problem Relation Age of Onset  . Heart attack Mother   . Heart attack Father   . Diabetes Sister   . Hypertension Sister   . Hypertension Brother   . Diabetes Brother   . Lung cancer Brother   . Colon cancer Brother     Social History   Socioeconomic History  . Marital status: Single    Spouse name: Not on file  . Number of children: Not on file  . Years of education: Not on file  . Highest education level: Not on file  Occupational History  . Not on file  Tobacco Use  . Smoking status: Former Research scientist (life sciences)  . Smokeless tobacco: Never Used  Substance and Sexual Activity  . Alcohol use: No  . Drug use: No  . Sexual activity: Not on file  Other Topics Concern  . Not on file  Social History Narrative  . Not on file   Social Determinants of Health   Financial Resource Strain:   . Difficulty of Paying Living Expenses: Not on file  Food Insecurity:   . Worried About  Charity fundraiser in the Last Year: Not on file  . Ran Out of Food in the Last Year: Not on file  Transportation Needs:   . Lack of Transportation (Medical): Not on file  . Lack of Transportation (Non-Medical): Not on file  Physical Activity:   . Days of Exercise per Week: Not on file  . Minutes of Exercise per Session: Not on file  Stress:   . Feeling of Stress : Not on file  Social Connections:   . Frequency of Communication with Friends and Family: Not on file  . Frequency of Social Gatherings with Friends and Family: Not on file  . Attends Religious Services: Not on file  . Active Member of Clubs or Organizations: Not on file  . Attends Archivist Meetings: Not on file  . Marital Status: Not on file  Intimate Partner Violence:   . Fear of Current or Ex-Partner: Not on file  . Emotionally Abused: Not on file  . Physically Abused: Not on file  . Sexually Abused: Not on file    Review of Systems: Gen: Denies fever, sweats or chills. No weight loss.  CV: Denies chest pain, palpitations or edema. Resp: Denies cough, shortness of breath of hemoptysis.  GI: Denies heartburn, dysphagia, stomach or lower abdominal pain. No diarrhea or constipation. No rectal bleeding or melena.   GU : Denies urinary burning, blood in urine, increased urinary frequency or incontinence. MS: Denies joint pain, muscles aches or weakness. Derm: Denies rash, itchiness, skin lesions or unhealing ulcers. Psych: Dementia. Denies depression anxiety. Heme: Denies easy bruising, bleeding. Neuro:  Denies headaches, dizziness or paresthesias. Endo:  Denies any problems with DM, thyroid or adrenal function.  Physical Exam: Vital signs in last 24 hours: Temp:  [97.8 F (36.6 C)-98.1 F (36.7 C)] 97.8 F (36.6 C) (02/04 2136) Pulse Rate:  [58-74] 74 (02/04 2136) Resp:  [19-23] 20 (02/04 2136) BP: (107-119)/(43-62) 107/48 (02/04 2136) SpO2:  [92 %-100 %] 92 % (02/04 2136) Last BM Date:  07/27/19 General: 79 year old female awake and alert in no acute distress. Head:  Normocephalic and atraumatic. Eyes:  No scleral icterus.  PERRLA.  Conjunctiva pink. Ears:  Normal auditory acuity. Nose:  No deformity, discharge or lesions. Mouth: No deformity or lesions.  Absent dentition. Neck:  Supple. No lymphadenopathy or thyromegaly.  Lungs: Fine crackles bilaterally from the bases to mid lung fields.  Heart: Regular rate and rhythm, no murmurs. Abdomen: Obese abdomen, soft, nondistended.  Nontender.  Positive bowel sounds all 4 quadrants.  Large right upper quadrant scar intact.  No HSM. Rectal: Patient declined exam, she stated her stool was already checked which showed blood. Msk:  Symmetrical without gross deformities.  Pulses:  Normal pulses noted. Extremities:  Without clubbing or edema. Neurologic:  She is alert to her name.  She knows she is in the hospital.  She knows the year is 2021.  Her speech is clear.  She answers questions appropriately.  She moves all extremities equally. Skin:  Intact without significant lesions or rashes. Psych:  Alert and cooperative. Normal mood and affect.  Intake/Output from previous day: No intake/output data recorded. Intake/Output this shift: No intake/output data recorded.  Lab Results: Recent Labs    07/27/19 1640 07/27/19 2053 07/28/19 0042  WBC 5.1  --  5.3  HGB 7.3* 7.3* 7.0*  HCT 28.2* 27.4* 26.5*  PLT 219  --  218   BMET Recent Labs    07/27/19 1640 07/28/19 0042  NA 142 142  K 3.6 3.9  CL 95* 97*  CO2 36* 36*  GLUCOSE 155* 99  BUN 15 13  CREATININE 1.00 0.87  CALCIUM 8.8* 8.8*   LFT Recent Labs    07/28/19 0042  PROT 5.8*  ALBUMIN 3.2*  AST 14*  ALT 11  ALKPHOS 37*  BILITOT 0.9   PT/INR Recent Labs    07/27/19 1640 07/28/19 0042  LABPROT 14.3 14.2  INR 1.1 1.1   Hepatitis Panel No results for input(s): HEPBSAG, HCVAB, HEPAIGM, HEPBIGM in the last 72 hours.   Studies/Results: No results  found.  IMPRESSION/PLAN:  27. 79 year old female with dementia admitted to the hospital with acute on chronic anemia.  Admission Hg 7.3 (Hg 7.7 Nov. 2020. Hg 10.1 05/2018).  MCV83.2. Iron 27.  TIBC 403.  Ferritin 9. Two units of packed red blood cells have been ordered. FOBT positive.  No obvious signs of active GI bleeding. Patient declined rectal exam.  -Monitor and document bowel movement, discussed with nursing staff -Repeat CBC in a.m -Continue to hold Eliquis.  I contacted [facility] to verify when her last dose of Eliquis was, I was unable to speak to the nursing staff at this time. -N.p.o. for now -Plan for EGD today or tomorrow.  I attempted to contact the patient's niece who is her listed contact: Gevena Mart [phone].  I will make a second attempt to contact her later this morning.  We will need to obtain consent for EGD. -IV fluids per the hospitalist -Agree with pantoprazole 40 mg IV twice daily -Consider  IV iron after PRBC infusions completed  2. History of paroxysmal atrial fibrillation, CHADVASC 5. on Eliquis. -Continue to hold Eliquis  3.  COPD  4.  Diabetes mellitus type 2  Patrecia Pour Kennedy-Smith  07/28/2019, 5:21 AM

## 2019-07-28 NOTE — Progress Notes (Signed)
Patient noted to be having pain post procedure. I suspect that it is most likely a result of the epinephrine that we placed to decrease risk of bleeding and it moving to other areas of the stomach. Did not look to have any evidence of a deeper mucosal lesion/defect after resection of the polyp. We closed it completely. We will offer patient pain medication gently while monitoring her clinical and respiratory status. We proceed with KUB/CXR portable. If patient is worsening, then will plan to proceed with possible CTAP.  Corliss Parish, MD [facility] Office # [phone]

## 2019-07-28 NOTE — Transfer of Care (Signed)
Immediate Anesthesia Transfer of Care Note  Patient: JISELL MAJER  Procedure(s) Performed: Procedure(s): ESOPHAGOGASTRODUODENOSCOPY (EGD) (N/A) BIOPSY ENDOSCOPIC MUCOSAL RESECTION HEMOSTASIS CLIP PLACEMENT SUBMUCOSAL LIFTING INJECTION SUBMUCOSAL INJECTION  Patient Location: PACU  Anesthesia Type:MAC  Level of Consciousness:  sedated, patient cooperative and responds to stimulation  Airway & Oxygen Therapy:Patient Spontanous Breathing and Patient connected to face mask oxgen  Post-op Assessment:  Report given to PACU RN and Post -op Vital signs reviewed and stable  Post vital signs:  Reviewed and stable  Last Vitals:  Vitals:   07/28/19 1138 07/28/19 1221  BP: (!) 112/49 (!) 119/57  Pulse: (!) 59   Resp: 18 16  Temp: 36.8 C 37 C  SpO2: 72% 09%    Complications: No apparent anesthesia complications

## 2019-07-28 NOTE — Anesthesia Preprocedure Evaluation (Signed)
Anesthesia Evaluation  Patient identified by MRN, date of birth, ID band Patient awake    Reviewed: Allergy & Precautions, NPO status , Patient's Chart, lab work & pertinent test results  History of Anesthesia Complications Negative for: history of anesthetic complications  Airway Mallampati: I  TM Distance: >3 FB Neck ROM: Full    Dental  (+) Edentulous Upper, Edentulous Lower   Pulmonary COPD,  COPD inhaler and oxygen dependent, former smoker,    breath sounds clear to auscultation       Cardiovascular hypertension, Pt. on medications and Pt. on home beta blockers + dysrhythmias Atrial Fibrillation  Rhythm:Irregular Rate:Normal  '19 ECHO: Left ventricle: The cavity size was normal. Systolic function was normal, EF 55-60%.  - Aortic valve: There was trivial regurgitation.  - Right ventricle: The cavity size was moderately dilated.  - Tricuspid valve: There was moderate regurgitation.    Neuro/Psych negative neurological ROS     GI/Hepatic Neg liver ROS, GERD  Medicated,  Endo/Other  diabetes, Oral Hypoglycemic AgentsHypothyroidism Morbid obesity  Renal/GU negative Renal ROS     Musculoskeletal   Abdominal (+) + obese,   Peds  Hematology  (+) Blood dyscrasia (Hb 7.0), anemia , eliquis   Anesthesia Other Findings   Reproductive/Obstetrics                             Anesthesia Physical Anesthesia Plan  ASA: III  Anesthesia Plan: MAC   Post-op Pain Management:    Induction:   PONV Risk Score and Plan: 2 and Treatment may vary due to age or medical condition  Airway Management Planned: Nasal Cannula and Natural Airway  Additional Equipment:   Intra-op Plan:   Post-operative Plan:   Informed Consent: I have reviewed the patients History and Physical, chart, labs and discussed the procedure including the risks, benefits and alternatives for the proposed anesthesia with the  patient or authorized representative who has indicated his/her understanding and acceptance.   Patient has DNR.  Discussed DNR with patient and Suspend DNR.     Plan Discussed with: CRNA and Surgeon  Anesthesia Plan Comments:         Anesthesia Quick Evaluation

## 2019-07-28 NOTE — Plan of Care (Signed)
  Problem: Education: Goal: Knowledge of General Education information will improve Description Including pain rating scale, medication(s)/side effects and non-pharmacologic comfort measures Outcome: Progressing   

## 2019-07-28 NOTE — Progress Notes (Signed)
PROGRESS NOTE  Sheila Hayes XBJ:[id number] DOB: [birth year] DOA: 07/27/2019 PCP: Almetta Lovely, [facility]  HPI/Recap of past 24 hours: HPI from Dr Arlean Hopping Sheila Hayes is a 79 y.o. female with medical history significant for dementia, paroxysmal atrial fibrillation chronically anticoagulated on Eliquis since November '20, chronic diastolic heart failure, COPD, chronic hypoxic respiratory failure on continuous 3 L [place], HTN, DM2, and COVID-19 infection in November '20 prompting hospitalization admitted for evaluation of low hemoglobin identified as an outpatient.  In the ED, vital signs stable, on chronic home O2 3 L saturating around 100%.  Labs showed hemoglobin 7.3, last here on file was 7.7 on 05/12/2019, FOBT positive.  EDP discussed with GI.  Patient admitted for further management.    Today, saw patient this morning, resting comfortably in bed.  Stated that she was hungry and would like to eat.  Denies any epigastric/abdominal pain, chest pain, worsening shortness of breath, cough, fever/chills, nausea/vomiting.  Shortly after EGD today, patient complained of abdominal pain, nausea with an episode of vomiting.  GI aware and  following patient closely.    Assessment/Plan: Principal Problem:   Acute lower GI bleeding Active Problems:   HTN (hypertension)   Diabetes (HCC)   Chronic diastolic CHF (congestive heart failure) (HCC)   Hypothyroidism   Acute on chronic anemia  Upper GI bleed likely 2/2 single ulcerated gastric polyp Hemoglobin trended down to 7.3 on admission FOBT positive GI on board, EGD done on 07/28/2019 showed a single gastric polyp with ulceration and hematin, resected, clips were placed.  Also noted multiple gastric polyps.  Biopsied.  GI recommended clear liquid diet, PPI to 40 mg twice daily, hold off anticoagulation for at least 48 hours or may restart Heparin GTT in 12 hours may be considered.  Await pathology results. KUB, chest x-ray post EGD  unremarkable Continue PPI Monitor closely  Acute on chronic blood loss anemia Likely 2/2 above, hemoglobin 7.3 on admission--> 7.0 Hemoglobin baseline between 8-9 Anemia panel showed iron 27, sat 7, ferritin 9, folate 10.7 Plan to transfuse 2 units of PRBC on 07/28/2019 Plan for IV Feraheme as well as p.o. iron, starting 07/29/2019 Continue to hold Eliquis for now Daily CBC  Paroxysmal A. Fib Currently heart rate controlled Continue to hold Lopressor for now Continue to hold Eliquis for now  Chronic diastolic HF Appears stable Echo in 2019 showed EF of 55 to 60% Continue to hold home Lasix Strict I's and O's Daily weights  Diabetes mellitus type 2 Last A1c 5.4 on 04/2019 Continue SSI, Accu-Cheks, hypoglycemic protocol Hold home glipizide, Metformin for now  History of COVID-19 pneumonia Diagnosed on 05/02/2019 Currently stable on 3 L of home O2 Afebrile, no leukocytosis Monitor closely         Malnutrition Type:      Malnutrition Characteristics:      Nutrition Interventions:       Estimated body mass index is 31.12 kg/m as calculated from the following:   Height as of 05/01/19: 5\' 7"  (1.702 m).   Weight as of this encounter: 90.1 kg.     Code Status: DNR  Family Communication: None at bedside  Disposition Plan: Came from SNF, likely DC back to SNF once work-up and management complete in about 24 to 48 hours.   Consultants:  GI  Procedures:  EGD on 07/28/2019  Antimicrobials:  None  DVT prophylaxis: SCDs   Objective: Vitals:   07/28/19 1440 07/28/19 1450 07/28/19 1456 07/28/19 1500  BP: (!) 174/56 09/25/19)  149/62 (!) 136/40 (!) 138/38  Pulse: 66 66  72  Resp: 16 20  20   Temp:      TempSrc:      SpO2: 96% 100% 100% 97%  Weight:        Intake/Output Summary (Last 24 hours) at 07/28/2019 1524 Last data filed at 07/28/2019 1430 Gross per 24 hour  Intake 688 ml  Output --  Net 688 ml   Filed Weights   07/28/19 0500  Weight: 90.1 kg     Exam:  General: NAD   Cardiovascular: S1, S2 present  Respiratory: CTAB  Abdomen: Soft, nontender, nondistended, bowel sounds present  Musculoskeletal: No bilateral pedal edema noted  Skin: Normal  Psychiatry: Normal mood   Data Reviewed: CBC: Recent Labs  Lab 07/27/19 1640 07/27/19 2053 07/28/19 0042  WBC 5.1  --  5.3  NEUTROABS 3.5  --   --   HGB 7.3* 7.3* 7.0*  HCT 28.2* 27.4* 26.5*  MCV 83.2  --  82.6  PLT 219  --  209   Basic Metabolic Panel: Recent Labs  Lab 07/27/19 1640 07/28/19 0042  NA 142 142  K 3.6 3.9  CL 95* 97*  CO2 36* 36*  GLUCOSE 155* 99  BUN 15 13  CREATININE 1.00 0.87  CALCIUM 8.8* 8.8*  MG 1.7 1.9   GFR: Estimated Creatinine Clearance: 62.4 mL/min (by C-G formula based on SCr of 0.87 mg/dL). Liver Function Tests: Recent Labs  Lab 07/27/19 1640 07/28/19 0042  AST 17 14*  ALT 13 11  ALKPHOS 40 37*  BILITOT 0.8 0.9  PROT 6.4* 5.8*  ALBUMIN 3.3* 3.2*   No results for input(s): LIPASE, AMYLASE in the last 168 hours. No results for input(s): AMMONIA in the last 168 hours. Coagulation Profile: Recent Labs  Lab 07/27/19 1640 07/28/19 0042  INR 1.1 1.1   Cardiac Enzymes: No results for input(s): CKTOTAL, CKMB, CKMBINDEX, TROPONINI in the last 168 hours. BNP (last 3 results) No results for input(s): PROBNP in the last 8760 hours. HbA1C: No results for input(s): HGBA1C in the last 72 hours. CBG: Recent Labs  Lab 07/27/19 2357 07/28/19 0622 07/28/19 0736 07/28/19 1154  GLUCAP 98 92 100* 96   Lipid Profile: No results for input(s): CHOL, HDL, LDLCALC, TRIG, CHOLHDL, LDLDIRECT in the last 72 hours. Thyroid Function Tests: No results for input(s): TSH, T4TOTAL, FREET4, T3FREE, THYROIDAB in the last 72 hours. Anemia Panel: Recent Labs    07/27/19 1640 07/27/19 2053  FOLATE  --  10.7  FERRITIN  --  9*  TIBC  --  403  IRON  --  27*  RETICCTPCT 1.6  --    Urine analysis:    Component Value Date/Time    COLORURINE YELLOW 05/09/2019 [place] 05/09/2019 0519   APPEARANCEUR Clear 07/24/2013 1152   LABSPEC >1.046 (H) 05/09/2019 0519   LABSPEC 1.010 07/24/2013 1152   PHURINE 5.0 05/09/2019 0519   GLUCOSEU >=500 (A) 05/09/2019 0519   GLUCOSEU Negative 07/24/2013 1152   HGBUR NEGATIVE 05/09/2019 0519   BILIRUBINUR NEGATIVE 05/09/2019 0519   BILIRUBINUR Negative 07/24/2013 1152   KETONESUR 5 (A) 05/09/2019 0519   PROTEINUR NEGATIVE 05/09/2019 0519   NITRITE NEGATIVE 05/09/2019 0519   LEUKOCYTESUR NEGATIVE 05/09/2019 0519   LEUKOCYTESUR Negative 07/24/2013 1152   Sepsis Labs: @LABRCNTIP (procalcitonin:4,lacticidven:4)  ) Recent Results (from the past 240 hour(s))  SARS CORONAVIRUS 2 (TAT 6-24 HRS) Nasopharyngeal Nasopharyngeal Swab     Status: None   Collection Time: 07/27/19  7:24 PM   Specimen: Nasopharyngeal Swab  Result Value Ref Range Status   SARS Coronavirus 2 NEGATIVE NEGATIVE Final    Comment: (NOTE) SARS-CoV-2 target nucleic acids are NOT DETECTED. The SARS-CoV-2 RNA is generally detectable in upper and lower respiratory specimens during the acute phase of infection. Negative results do not preclude SARS-CoV-2 infection, do not rule out co-infections with other pathogens, and should not be used as the sole basis for treatment or other patient management decisions. Negative results must be combined with clinical observations, patient history, and epidemiological information. The expected result is Negative. Fact Sheet for Patients: HairSlick.no Fact Sheet for Healthcare Providers: quierodirigir.com This test is not yet approved or cleared by the [place] FDA and  has been authorized for detection and/or diagnosis of SARS-CoV-2 by FDA under an Emergency Use Authorization (EUA). This EUA will remain  in effect (meaning this test can be used) for the duration of the COVID-19 declaration under Section  56 4(b)(1) of the Act, 21 U.S.C. section 360bbb-3(b)(1), unless the authorization is terminated or revoked sooner. Performed at [facility], [address] [place], [place] [address]       Studies: DG Chest Port 1 View  Result Date: 07/28/2019 CLINICAL DATA:  Nausea and vomiting after procedure EXAM: PORTABLE CHEST 1 VIEW COMPARISON:  05/08/2019 FINDINGS: Single frontal view of the chest demonstrates chronic enlargement of cardiac silhouette. There is chronic interstitial scarring without acute airspace disease, effusion, or pneumothorax. No acute bony abnormalities. IMPRESSION: 1. Diffuse interstitial prominence compatible with chronic scarring. No acute airspace disease. Electronically Signed   By: Sharlet Salina M.D.   On: 07/28/2019 14:41   DG Abd Portable 2V  Result Date: 07/28/2019 CLINICAL DATA:  Polyp removal, nausea and vomiting after procedure EXAM: PORTABLE ABDOMEN - 2 VIEW COMPARISON:  05/14/2016 FINDINGS: Supine and left lateral decubitus views of the abdomen are obtained. No free intraperitoneal gas. Unremarkable bowel gas pattern without signs of obstruction. Barium filled diverticula are seen within the transverse colon. Moderate lower lumbar spondylosis. No acute fractures. IMPRESSION: 1. No free intraperitoneal gas. 2. Unremarkable bowel gas pattern. Electronically Signed   By: Sharlet Salina M.D.   On: 07/28/2019 14:44    Scheduled Meds: . sodium chloride   Intravenous Once  . insulin aspart  0-9 Units Subcutaneous Q6H  . mometasone-formoterol  2 puff Inhalation BID  . pantoprazole (PROTONIX) IV  40 mg Intravenous Q12H  . sodium chloride flush  3 mL Intravenous Q12H  . umeclidinium bromide  1 puff Inhalation Daily    Continuous Infusions: . lactated ringers       LOS: 0 days     Briant Cedar, MD [facility]  If 7PM-7AM, please contact night-coverage [email or web address] 07/28/2019, 3:24 PM

## 2019-07-28 NOTE — Anesthesia Postprocedure Evaluation (Signed)
Anesthesia Post Note  Patient: FLORIDE HUTMACHER  Procedure(s) Performed: ESOPHAGOGASTRODUODENOSCOPY (EGD) (N/A ) BIOPSY ENDOSCOPIC MUCOSAL RESECTION HEMOSTASIS CLIP PLACEMENT SUBMUCOSAL LIFTING INJECTION SUBMUCOSAL INJECTION     Patient location during evaluation: PACU Anesthesia Type: MAC Level of consciousness: awake and alert, oriented and patient cooperative Pain management: pain level controlled Vital Signs Assessment: post-procedure vital signs reviewed and stable Respiratory status: spontaneous breathing, nonlabored ventilation and respiratory function stable Cardiovascular status: blood pressure returned to baseline and stable Postop Assessment: no apparent nausea or vomiting Anesthetic complications: no    Last Vitals:  Vitals:   07/28/19 1410 07/28/19 1420  BP: (!) 149/50 (!) 143/39  Pulse: 81 78  Resp: (!) 24 (!) 22  Temp:    SpO2: 96% 95%    Last Pain:  Vitals:   07/28/19 1410  TempSrc:   PainSc: [place]

## 2019-07-28 NOTE — Consult Note (Signed)
Referring Provider: Dr. Mitzi Hansen  Primary Care Physician:  [facility] Primary Gastroenterologist:  Althia Forts   Reason for Consultation:  Anemia, + FOBT  HPI: Sheila Hayes is a 79 y.o. female with a past medical history of hypertension, paroxysmal atrial fibrillation on Eliquis, CHF, pulmonary hypertension, DM II, hypothyroidism, MRSA bacteremia and dementia. COVID 19 positive 04/2019.  Past tonsillectomy, appendectomy and cholecystectomy.  Due to her history of dementia, her past medical history was collected from her medical records.  She resides at [facility].  In review of her records, she was admitted to the [facility] hospital 05/01/2019 with acute respiratory failure secondary to COVID 19. She was treated with Remdesivir and steroids. She developed atrial fibrillation treated with Metoprolol and Lovenox which was switched to Eliquis prior to discharge to SNF.She was discharged to the SNF on 11/15 then back at [facility] ED 11/16 with acute on chronic respiratory failure. She received steroids. CT showed a right middle lobe infiltrate suggestive of acute aspiration, less likely bacteria pneumonia.  She received IV Unasyn, Symbicort/Spiriva and Albuterol HFA were continued. Urine cultures + Citrobacter. She was discharged to the SNF on 05/14/2019. Palliative care was initiated.  She remains on Eliquis since her earlier November hospital admission with the onset of atrial fibrillation.  She underwent laboratory studies 07/27/2019 at the SNF which identified a hemoglobin of 6.8.  She did not exhibit any signs of active GI bleeding. She was sent to [facility] ED for further evaluation.  In the ED, hemoglobin level was 7.3 (Hemoglobin 7.7 on the day of her hospital discharge 05/12/2019).  FOBT was positive.  GI consult was requested for further evaluation.   She was resting comfortably this morning.  She awakens easily to name.  She does not know the reason  why she is in the hospital.  She denies having any upper or lower abdominal pain.  No nausea or vomiting.  No dysphagia or heartburn.  She reports passing grayish colored formed stools.  She denies seeing any obvious bright red blood from the rectum or black stools.  No chest pain or shortness of breath.  No dysuria or hematuria.  He does not know when she last took Eliquis. She denies taking any ASA. It is unknown if she is ever had an EGD or colonoscopy.  She is hemodynamically stable.  No family at the bedside.  ED course: Na 142. K 3.6. CO2 36. Glu 155. BUN 15. Cr. 1.00. Ca 8.8. Anion gap 11. Mg 1.7. Alk phos 40. AST 17. ALT 13. T. Bili 0.8. Iron 27. TIBC 403. Ferritin 9. Folate 10.7. WBC 5.1. Hg 7.3 (Hg 7.7 on 05/12/2019). HCT 28.2. MCV 83.2. PLT 219. Retic ct 55.4. INR 1.1.  SARS Coronairus  2 negative. FOBT +.  I Past Medical History:  Diagnosis Date  . Chronic diastolic congestive heart failure ([place])   . COPD (chronic obstructive pulmonary disease) ([place])   . Diabetes mellitus without complication ([place])   . Hyperlipidemia   . Hypertension   . Hypothyroidism   . Morbid obesity ([place])   . Paroxysmal A-fib ([place])   . Pulmonary hypertension ([place])     Past Surgical History:  Procedure Laterality Date  . APPENDECTOMY    . CHOLECYSTECTOMY      Prior to Admission medications   Medication Sig Start Date End Date Taking? Authorizing Provider  acetaminophen (TYLENOL) 325 MG tablet Take 2 tablets (650 mg total) by mouth every 6 (six) hours  as needed for mild pain or headache (fever >/= 101). 05/14/19  Yes Elgergawy, Silver Huguenin, MD  albuterol (PROVENTIL HFA;VENTOLIN HFA) 108 (90 Base) MCG/ACT inhaler Inhale 2 puffs into the lungs every 6 (six) hours as needed for wheezing or shortness of breath.   Yes [provider]  apixaban (ELIQUIS) 5 MG TABS tablet Take 1 tablet (5 mg total) by mouth 2 (two) times daily. 05/07/19  Yes Patrecia Pour, MD  budesonide-formoterol (SYMBICORT) 80-4.5  MCG/ACT inhaler Inhale 2 puffs into the lungs 2 (two) times daily.   Yes [provider]  calcium carbonate (OSCAL) 1500 (600 Ca) MG TABS tablet Take 600 mg of elemental calcium by mouth 2 (two) times daily with a meal.    Yes [provider]  docusate sodium (COLACE) 100 MG capsule Take 200 mg by mouth every other day.    Yes [provider]  furosemide (LASIX) 20 MG tablet Take 20 mg by mouth daily.   Yes [provider]  gabapentin (NEURONTIN) 100 MG capsule Take 200 mg by mouth 2 (two) times daily.   Yes [provider]  insulin lispro (HUMALOG) 100 UNIT/ML injection Inject 0-8 Units into the skin 3 (three) times daily before meals. 0-200= 0 units 201-250=2 units 251-300=4 units 301-350=6 units 351-400=8 units   Yes [provider]  levothyroxine (SYNTHROID, LEVOTHROID) 125 MCG tablet Take 125 mcg by mouth daily before breakfast.   Yes [provider]  metoprolol succinate (TOPROL-XL) 25 MG 24 hr tablet Take 12.5 mg by mouth daily.   Yes [provider]  nicotine (NICODERM CQ - DOSED IN MG/24 HOURS) 14 mg/24hr patch Place 14 mg onto the skin daily.   Yes [provider]  nystatin (NYSTATIN) powder Apply topically 2 (two) times daily as needed. For fungal growth   Yes [provider]  omeprazole (PRILOSEC) 20 MG capsule Take 20 mg by mouth daily.   Yes [provider]  potassium chloride (K-DUR) 10 MEQ tablet Take 10 mEq by mouth daily.   Yes [provider]  rosuvastatin (CRESTOR) 40 MG tablet Take 20 mg by mouth every evening.    Yes [provider]  tiotropium (SPIRIVA) 18 MCG inhalation capsule Place 18 mcg into inhaler and inhale daily.   Yes [provider]  OXYGEN Inhale 3 L into the lungs continuous.    [provider]    Current Facility-Administered Medications  Medication Dose Route Frequency Provider Last Rate Last Admin  . acetaminophen  (TYLENOL) tablet 650 mg  650 mg Oral Q6H PRN Howerter, Justin B, DO       Or  . acetaminophen (TYLENOL) suppository 650 mg  650 mg Rectal Q6H PRN Howerter, Justin B, DO      . albuterol (PROVENTIL) (2.5 MG/3ML) 0.083% nebulizer solution 3 mL  3 mL Inhalation Q4H PRN Howerter, Justin B, DO      . insulin aspart (novoLOG) injection 0-9 Units  0-9 Units Subcutaneous Q6H Howerter, Justin B, DO      . mometasone-formoterol (DULERA) 100-5 MCG/ACT inhaler 2 puff  2 puff Inhalation BID Howerter, Justin B, DO   Stopped at 07/27/19 1954  . ondansetron (ZOFRAN) injection 4 mg  4 mg Intravenous Q6H PRN Howerter, Justin B, DO      . pantoprazole (PROTONIX) injection 40 mg  40 mg Intravenous Q12H Howerter, Justin B, DO      . sodium chloride flush (NS) 0.9 % injection 3 mL  3 mL Intravenous Q12H Howerter,  Justin B, DO   3 mL at 07/27/19 2200  . umeclidinium bromide (INCRUSE ELLIPTA) 62.5 MCG/INH 1 puff  1 puff Inhalation Daily Howerter, Justin B, DO        Allergies as of 07/27/2019  . (No Known Allergies)    Family History  Problem Relation Age of Onset  . Heart attack Mother   . Heart attack Father   . Diabetes Sister   . Hypertension Sister   . Hypertension Brother   . Diabetes Brother   . Lung cancer Brother   . Colon cancer Brother     Social History   Socioeconomic History  . Marital status: Single    Spouse name: Not on file  . Number of children: Not on file  . Years of education: Not on file  . Highest education level: Not on file  Occupational History  . Not on file  Tobacco Use  . Smoking status: Former Research scientist (life sciences)  . Smokeless tobacco: Never Used  Substance and Sexual Activity  . Alcohol use: No  . Drug use: No  . Sexual activity: Not on file  Other Topics Concern  . Not on file  Social History Narrative  . Not on file   Social Determinants of Health   Financial Resource Strain:   . Difficulty of Paying Living Expenses: Not on file  Food Insecurity:   . Worried About  Charity fundraiser in the Last Year: Not on file  . Ran Out of Food in the Last Year: Not on file  Transportation Needs:   . Lack of Transportation (Medical): Not on file  . Lack of Transportation (Non-Medical): Not on file  Physical Activity:   . Days of Exercise per Week: Not on file  . Minutes of Exercise per Session: Not on file  Stress:   . Feeling of Stress : Not on file  Social Connections:   . Frequency of Communication with Friends and Family: Not on file  . Frequency of Social Gatherings with Friends and Family: Not on file  . Attends Religious Services: Not on file  . Active Member of Clubs or Organizations: Not on file  . Attends Archivist Meetings: Not on file  . Marital Status: Not on file  Intimate Partner Violence:   . Fear of Current or Ex-Partner: Not on file  . Emotionally Abused: Not on file  . Physically Abused: Not on file  . Sexually Abused: Not on file    Review of Systems: Gen: Denies fever, sweats or chills. No weight loss.  CV: Denies chest pain, palpitations or edema. Resp: Denies cough, shortness of breath of hemoptysis.  GI: Denies heartburn, dysphagia, stomach or lower abdominal pain. No diarrhea or constipation. No rectal bleeding or melena.   GU : Denies urinary burning, blood in urine, increased urinary frequency or incontinence. MS: Denies joint pain, muscles aches or weakness. Derm: Denies rash, itchiness, skin lesions or unhealing ulcers. Psych: Dementia. Denies depression anxiety. Heme: Denies easy bruising, bleeding. Neuro:  Denies headaches, dizziness or paresthesias. Endo:  Denies any problems with DM, thyroid or adrenal function.  Physical Exam: Vital signs in last 24 hours: Temp:  [97.8 F (36.6 C)-98.1 F (36.7 C)] 97.8 F (36.6 C) (02/04 2136) Pulse Rate:  [58-74] 74 (02/04 2136) Resp:  [19-23] 20 (02/04 2136) BP: (107-119)/(43-62) 107/48 (02/04 2136) SpO2:  [92 %-100 %] 92 % (02/04 2136) Last BM Date:  07/27/19 General: 79 year old female awake and alert in no acute distress. Head:  Normocephalic and atraumatic. Eyes:  No scleral icterus.  PERRLA.  Conjunctiva pink. Ears:  Normal auditory acuity. Nose:  No deformity, discharge or lesions. Mouth: No deformity or lesions.  Absent dentition. Neck:  Supple. No lymphadenopathy or thyromegaly.  Lungs: Fine crackles bilaterally from the bases to mid lung fields.  Heart: Regular rate and rhythm, no murmurs. Abdomen: Obese abdomen, soft, nondistended.  Nontender.  Positive bowel sounds all 4 quadrants.  Large right upper quadrant scar intact.  No HSM. Rectal: Patient declined exam, she stated her stool was already checked which showed blood. Msk:  Symmetrical without gross deformities.  Pulses:  Normal pulses noted. Extremities:  Without clubbing or edema. Neurologic:  She is alert to her name.  She knows she is in the hospital.  She knows the year is 2021.  Her speech is clear.  She answers questions appropriately.  She moves all extremities equally. Skin:  Intact without significant lesions or rashes. Psych:  Alert and cooperative. Normal mood and affect.  Intake/Output from previous day: No intake/output data recorded. Intake/Output this shift: No intake/output data recorded.  Lab Results: Recent Labs    07/27/19 1640 07/27/19 2053 07/28/19 0042  WBC 5.1  --  5.3  HGB 7.3* 7.3* 7.0*  HCT 28.2* 27.4* 26.5*  PLT 219  --  218   BMET Recent Labs    07/27/19 1640 07/28/19 0042  NA 142 142  K 3.6 3.9  CL 95* 97*  CO2 36* 36*  GLUCOSE 155* 99  BUN 15 13  CREATININE 1.00 0.87  CALCIUM 8.8* 8.8*   LFT Recent Labs    07/28/19 0042  PROT 5.8*  ALBUMIN 3.2*  AST 14*  ALT 11  ALKPHOS 37*  BILITOT 0.9   PT/INR Recent Labs    07/27/19 1640 07/28/19 0042  LABPROT 14.3 14.2  INR 1.1 1.1   Hepatitis Panel No results for input(s): HEPBSAG, HCVAB, HEPAIGM, HEPBIGM in the last 72 hours.   Studies/Results: No results  found.  IMPRESSION/PLAN:  27. 79 year old female with dementia admitted to the hospital with acute on chronic anemia.  Admission Hg 7.3 (Hg 7.7 Nov. 2020. Hg 10.1 05/2018).  MCV83.2. Iron 27.  TIBC 403.  Ferritin 9. Two units of packed red blood cells have been ordered. FOBT positive.  No obvious signs of active GI bleeding. Patient declined rectal exam.  -Monitor and document bowel movement, discussed with nursing staff -Repeat CBC in a.m -Continue to hold Eliquis.  I contacted [facility] to verify when her last dose of Eliquis was, I was unable to speak to the nursing staff at this time. -N.p.o. for now -Plan for EGD today or tomorrow.  I attempted to contact the patient's niece who is her listed contact: Gevena Mart [phone].  I will make a second attempt to contact her later this morning.  We will need to obtain consent for EGD. -IV fluids per the hospitalist -Agree with pantoprazole 40 mg IV twice daily -Consider  IV iron after PRBC infusions completed  2. History of paroxysmal atrial fibrillation, CHADVASC 5. on Eliquis. -Continue to hold Eliquis  3.  COPD  4.  Diabetes mellitus type 2  Patrecia Pour Kennedy-Smith  07/28/2019, 5:21 AM

## 2019-07-28 NOTE — Op Note (Signed)
[facility] Patient Name: Sheila Hayes Procedure Date: 07/28/2019 MRN: [id number] Attending MD: Justice Britain , MD Date of Birth: 06/28/1940 CSN: [id number] Age: 79 Admit Type: Inpatient Procedure:                Upper GI endoscopy Indications:              Iron deficiency anemia, Occult Blood Positive,                            History of anticoagulation Providers:                Justice Britain, MD, Burtis Junes, RN, Janeece Agee,                            Technician Referring MD:             [facility] Medicines:                Monitored Anesthesia Care Complications:            No immediate complications. Estimated Blood Loss:     Estimated blood loss was minimal. Procedure:                Pre-Anesthesia Assessment:                           - Prior to the procedure, a History and Physical                            was performed, and patient medications and                            allergies were reviewed. The patient's tolerance of                            previous anesthesia was also reviewed. The risks                            and benefits of the procedure and the sedation                            options and risks were discussed with the patient.                            All questions were answered, and informed consent                            was obtained. Prior Anticoagulants: The patient has                            taken Eliquis (apixaban), last dose was 1 day prior                            to procedure. ASA Grade Assessment: III - A patient  with severe systemic disease. After reviewing the                            risks and benefits, the patient was deemed in                            satisfactory condition to undergo the procedure.                           After obtaining informed consent, the endoscope was                            passed under direct vision. Throughout the              procedure, the patient's blood pressure, pulse, and                            oxygen saturations were monitored continuously. The                            GIF-H190 ([id number]) Olympus gastroscope was                            introduced through the mouth, and advanced to the                            second part of duodenum. The upper GI endoscopy was                            accomplished without difficulty. The patient                            tolerated the procedure. Scope In: Scope Out: Findings:      No gross lesions were noted in the entire esophagus.      The Z-line was regular and was found 40 cm from the incisors.      Patchy mildly erythematous mucosa without bleeding was found in the       gastric body and in the gastric antrum.      A single 19 mm pedunculated and sessile polyp with no bleeding and       stigmata of recent bleeding in the form of ulceration and hematin was       found in the gastric antrum. Preparations were made for mucosal       resection. 2 cc of Epinephrine were injected initially in order to       slightly raise the lesion. Then 5 mL Orise gel was injected to raise the       lesion further. Snare mucosal resection was performed. Resection and       retrieval were complete. To prevent bleeding after mucosal resection,       five hemostatic clips were successfully placed (MR conditional). There       was no bleeding during, or at the end, of the procedure.      Multiple 3 to 9 mm semi-sessile polyps with no bleeding and no stigmata       of recent bleeding  were found in the gastric antrum.      No other gross lesions were noted in the entire examined stomach.       Biopsies were taken with a cold forceps for histology and Helicobacter       pylori testing.      No gross lesions were noted in the duodenal bulb, in the first portion       of the duodenum and in the second portion of the duodenum. Impression:               - No gross lesions  in esophagus. Z-line regular, 40                            cm from the incisors.                           - Erythematous mucosa in the gastric body and                            antrum.                           - A single gastric polyp with ulceration and                            hematin - suspected source of +FOBT. Resected and                            retrieved via mucosal resection. Clips (MR                            conditional) were placed.                           - Multiple gastric polyps.                           - No other gross lesions in the stomach. Biopsied.                           - No gross lesions in the duodenal bulb, in the                            first portion of the duodenum and in the second                            portion of the duodenum. Moderate Sedation:      Not Applicable - Patient had care per Anesthesia. Recommendation:           - The patient will be observed post-procedure,                            until all discharge criteria are met.                           - Return patient to hospital ward for ongoing  care.                           - Patient has a contact number available for                            emergencies. The signs and symptoms of potential                            delayed complications were discussed with the                            patient. Return to normal activities tomorrow.                            Written discharge instructions were provided to the                            patient.                           - Clear liquid diet today.                           - Increase PPI to 40 mg twice daily for next                            61-month and then may decrease to once daily but                            should maintain this thereafter.                           - Would hold on restarting anticoagulation for at                            least 48 hours. If necessary then heparin gtt in 12                             hours without bolus (100AM on 2/6) may be                            considered if deemed necessary.                           - Await pathology results.                           - Normally would consider repeating EGD for                            surveillance, but unless high-grade dysplasia                            and/or malingnacy noted, I would not further pursue  other endoscopic surveillance or colonoscopy,                            unless evidence of bleeding or persistent/recurrent                            anemia is found.                           - IV Iron x 1-dose as inpatient. Then in 1-week                            start PO Iron once daily.                           - The findings and recommendations were discussed                            with the patient.                           - The findings and recommendations were discussed                            with the patient's family.                           - The findings and recommendations were discussed                            with the referring physician. Procedure Code(s):        --- Professional ---                           [phone], Esophagogastroduodenoscopy, flexible,                            transoral; with endoscopic mucosal resection Diagnosis Code(s):        --- Professional ---                           K31.89, Other diseases of stomach and duodenum                           K31.7, Polyp of stomach and duodenum                           D50.9, Iron deficiency anemia, unspecified CPT copyright 2019 American Medical Association. All rights reserved. The codes documented in this report are preliminary and upon coder review may  be revised to meet current compliance requirements. Justice Britain, MD 07/28/2019 2:10:15 PM Number of Addenda: 0

## 2019-07-29 DIAGNOSIS — D500 Iron deficiency anemia secondary to blood loss (chronic): Secondary | ICD-10-CM

## 2019-07-29 DIAGNOSIS — E039 Hypothyroidism, unspecified: Secondary | ICD-10-CM

## 2019-07-29 DIAGNOSIS — E119 Type 2 diabetes mellitus without complications: Secondary | ICD-10-CM

## 2019-07-29 DIAGNOSIS — Z7901 Long term (current) use of anticoagulants: Secondary | ICD-10-CM

## 2019-07-29 LAB — CBC WITH DIFFERENTIAL/PLATELET
Abs Immature Granulocytes: 0.01 10*3/uL (ref 0.00–0.07)
Basophils Absolute: 0 10*3/uL (ref 0.0–0.1)
Basophils Relative: 1 %
Eosinophils Absolute: 0.1 10*3/uL (ref 0.0–0.5)
Eosinophils Relative: 1 %
HCT: 31.3 % — ABNORMAL LOW (ref 36.0–46.0)
Hemoglobin: 8.6 g/dL — ABNORMAL LOW (ref 12.0–15.0)
Immature Granulocytes: 0 %
Lymphocytes Relative: 25 %
Lymphs Abs: 1.2 10*3/uL (ref 0.7–4.0)
MCH: 23 pg — ABNORMAL LOW (ref 26.0–34.0)
MCHC: 27.5 g/dL — ABNORMAL LOW (ref 30.0–36.0)
MCV: 83.7 fL (ref 80.0–100.0)
Monocytes Absolute: 0.3 10*3/uL (ref 0.1–1.0)
Monocytes Relative: 7 %
Neutro Abs: 3.2 10*3/uL (ref 1.7–7.7)
Neutrophils Relative %: 66 %
Platelets: 220 10*3/uL (ref 150–400)
RBC: 3.74 MIL/uL — ABNORMAL LOW (ref 3.87–5.11)
RDW: 15.4 % (ref 11.5–15.5)
WBC: 4.9 10*3/uL (ref 4.0–10.5)
nRBC: 0 % (ref 0.0–0.2)

## 2019-07-29 LAB — GLUCOSE, CAPILLARY
Glucose-Capillary: 106 mg/dL — ABNORMAL HIGH (ref 70–99)
Glucose-Capillary: 107 mg/dL — ABNORMAL HIGH (ref 70–99)
Glucose-Capillary: 172 mg/dL — ABNORMAL HIGH (ref 70–99)
Glucose-Capillary: 86 mg/dL (ref 70–99)
Glucose-Capillary: 87 mg/dL (ref 70–99)
Glucose-Capillary: 93 mg/dL (ref 70–99)

## 2019-07-29 LAB — BASIC METABOLIC PANEL
Anion gap: 8 (ref 5–15)
BUN: 10 mg/dL (ref 8–23)
CO2: 35 mmol/L — ABNORMAL HIGH (ref 22–32)
Calcium: 8.6 mg/dL — ABNORMAL LOW (ref 8.9–10.3)
Chloride: 97 mmol/L — ABNORMAL LOW (ref 98–111)
Creatinine, Ser: 0.78 mg/dL (ref 0.44–1.00)
GFR calc Af Amer: >60 mL/min (ref >60)
GFR calc non Af Amer: >60 mL/min (ref >60)
Glucose, Bld: 88 mg/dL (ref 70–99)
Potassium: 3.5 mmol/L (ref 3.5–5.1)
Sodium: 140 mmol/L (ref 135–145)

## 2019-07-29 MED ORDER — POLYETHYLENE GLYCOL 3350 17 G PO PACK
17.0000 g | PACK | Freq: Every day | ORAL | Status: DC
  Administered 2019-07-30: 10:00:00 17 g via ORAL
  Filled 2019-07-29 (×2): qty 1

## 2019-07-29 MED ORDER — FERROUS SULFATE 325 (65 FE) MG PO TABS
325.0000 mg | ORAL_TABLET | Freq: Every day | ORAL | Status: DC
  Administered 2019-07-29 – 2019-07-30 (×2): 325 mg via ORAL
  Filled 2019-07-29 (×2): qty 1

## 2019-07-29 MED ORDER — CHLORHEXIDINE GLUCONATE CLOTH 2 % EX PADS
6.0000 | MEDICATED_PAD | Freq: Every day | CUTANEOUS | Status: DC
  Administered 2019-07-29 – 2019-07-30 (×2): 6 via TOPICAL

## 2019-07-29 MED ORDER — SODIUM CHLORIDE 0.9 % IV SOLN
510.0000 mg | Freq: Once | INTRAVENOUS | Status: AC
  Administered 2019-07-29: 11:00:00 510 mg via INTRAVENOUS
  Filled 2019-07-29: qty 17

## 2019-07-29 MED ORDER — MUPIROCIN 2 % EX OINT
1.0000 "application " | TOPICAL_OINTMENT | Freq: Two times a day (BID) | CUTANEOUS | Status: DC
  Administered 2019-07-29 – 2019-07-30 (×4): 1 via NASAL
  Filled 2019-07-29: qty 22

## 2019-07-29 NOTE — Progress Notes (Signed)
[facility] Progress Note  CC: Anemia, FOBT +   Subjective: She denies having any abdominal pain. She passed a soft brown stool this morning and a loose brown stool after lunch. No rectal bleeding or melena as confirmed by her RN. She is sitting up in the chair. She received IV iron x 1. No complaints.   Objective:   EGD 07/28/2019:  - No gross lesions in esophagus. Z-line regular, 40 cm from the incisors. - Erythematous mucosa in the gastric body and antrum. - A 19 mm pedunculated and sessile single gastric polyp with ulceration and hematin - suspected source of +FOBT. 2cc of Epinephrine injected to raise the lesion prior to resection. Resected and retrieved via mucosal resection. 5 Clips (MR conditional) were placed. No bleeding during or at the end of the procedure. - Multiple gastric polyps. - No other gross lesions in the stomach. Biopsied. - No gross lesions in the duodenal bulb, in the first portion of the duodenum and in the second portion of the duodenum.   Vital signs in last 24 hours: Temp:  [97.9 F (36.6 C)-98.7 F (37.1 C)] 98.6 F (37 C) (02/06 0447) Pulse Rate:  [58-85] 70 (02/06 0447) Resp:  [16-33] 18 (02/06 0447) BP: (104-174)/(38-68) 104/53 (02/06 0447) SpO2:  [91 %-100 %] 94 % (02/06 0447) Weight:  [91.1 kg] 91.1 kg (02/06 0447) Last BM Date: 07/27/19 General:   Alert 79 year old female in NAD. Heart: RRR, no murmur.  Pulm:  Lungs clear throughout.  Abdomen: Soft, nontender, + BS x 4 quads.  Extremities:  Without edema. Neurologic:  Alert and  oriented x4;  grossly normal neurologically. Psych:  Alert and cooperative. Normal mood and affect.  Intake/Output from previous day: 02/05 0701 - 02/06 0700 In: 962 [I.V.:400; Blood:562] Out: 900 [Urine:900] Intake/Output this shift: Total I/O In: -  Out: 900 [Urine:900]  Lab Results: Recent Labs    07/27/19 1640 07/27/19 2053 07/28/19 0042 07/28/19 1935 07/29/19 0213  WBC 5.1  --   5.3  --  4.9  HGB 7.3*   < > 7.0* 9.3* 8.6*  HCT 28.2*   < > 26.5* 32.9* 31.3*  PLT 219  --  218  --  220   < > = values in this interval not displayed.   BMET Recent Labs    07/27/19 1640 07/28/19 0042 07/29/19 0213  NA 142 142 140  K 3.6 3.9 3.5  CL 95* 97* 97*  CO2 36* 36* 35*  GLUCOSE 155* 99 88  BUN 15 13 10   CREATININE 1.00 0.87 0.78  CALCIUM 8.8* 8.8* 8.6*   LFT Recent Labs    07/28/19 0042  PROT 5.8*  ALBUMIN 3.2*  AST 14*  ALT 11  ALKPHOS 37*  BILITOT 0.9   PT/INR Recent Labs    07/27/19 1640 07/28/19 0042  LABPROT 14.3 14.2  INR 1.1 1.1   Hepatitis Panel No results for input(s): HEPBSAG, HCVAB, HEPAIGM, HEPBIGM in the last 72 hours.  DG Chest Port 1 View  Result Date: 07/28/2019 CLINICAL DATA:  Nausea and vomiting after procedure EXAM: PORTABLE CHEST 1 VIEW COMPARISON:  05/08/2019 FINDINGS: Single frontal view of the chest demonstrates chronic enlargement of cardiac silhouette. There is chronic interstitial scarring without acute airspace disease, effusion, or pneumothorax. No acute bony abnormalities. IMPRESSION: 1. Diffuse interstitial prominence compatible with chronic scarring. No acute airspace disease. Electronically Signed   By: 05/10/2019 M.D.   On: 07/28/2019 14:41   DG Abd  Portable 2V  Result Date: 07/28/2019 CLINICAL DATA:  Polyp removal, nausea and vomiting after procedure EXAM: PORTABLE ABDOMEN - 2 VIEW COMPARISON:  05/14/2016 FINDINGS: Supine and left lateral decubitus views of the abdomen are obtained. No free intraperitoneal gas. Unremarkable bowel gas pattern without signs of obstruction. Barium filled diverticula are seen within the transverse colon. Moderate lower lumbar spondylosis. No acute fractures. IMPRESSION: 1. No free intraperitoneal gas. 2. Unremarkable bowel gas pattern. Electronically Signed   By: Randa Ngo M.D.   On: 07/28/2019 14:44    Assessment / Plan:  55. 79 year old female with dementia admitted to the hospital  2/4 with acute on chronic anemia.  Admission Hg 7.3 (Hg 7.7 Nov. 2020. Hg 10.1 05/2018).  MCV83.2. Iron 27.  TIBC 403.  Ferritin 9. FOBT +.  E/GD 2/6 identified a 43mm gastric polyp with ulceration and hematin, suspected source of + FOBT. She developed post EGD abdominal pain, most likely due to epi injection prior to polypectomy. A chest xray and KUB did not show any evidence of a perforation. Transfused 2 units of PRBCs 2/5 yesterday afternoon. Post transfusion Hg 9.3. Today Hg 8.6. BUN 10. IV iron x 1 given this am. No melena or hematochezia.  -In one week patient to start oral iron once daily -Pantoprazole 40mg  po bid for the next 2 months then once daily - Would hold off on restarting anticoagulation for at least 48 hours post EGD. If necessary then heparin gtt in 12 hours without bolus (100AM on 2/6) may be considered if deemed necessary. -Await pathology results -Most likely will not purse repeat surveillance or surveillance colonoscopy unless evidence or active GI bleeding.  -Repeat CBC in am, stat H/H if she demonstrates active GI bleeding   2. History of paroxysmal atrial fibrillation, CHADVASC 5. on Eliquis. -Continue to hold Eliquis -See plan 3 1  3.  COPD  4.  Diabetes mellitus type 2   Principal Problem:   Acute lower GI bleeding Active Problems:   HTN (hypertension)   Diabetes (HCC)   Chronic diastolic CHF (congestive heart failure) (HCC)   Hypothyroidism   Acute on chronic anemia     LOS: 1 day   Noralyn Pick  07/29/2019, 5:32 AM

## 2019-07-29 NOTE — Evaluation (Signed)
Physical Therapy Evaluation Patient Details Name: Sheila Hayes MRN: [id number] DOB: 1940/11/25 Today's Date: 07/29/2019   History of Present Illness  79 yo female admitted to ED from [facility] for anemia requiring transfusion secondary to GIB. PMH includes dementia, paroxysmal atrial fibrillation chronically anticoagulated on Eliquis since November '20, chronic diastolic heart failure, COPD, chronic hypoxic respiratory failure on continuous 3 L [place], HTN, DM2, and COVID-19 infection in November '20 prompting hospitalization at that time, chronic anemia with baseline Hgb 9-10.  Clinical Impression   Pt presents with generalized weakness, difficulty performing mobility tasks, unsteadiness in standing, inability to ambulate this session due to fatigue and weakness, and decreased activity tolerance. Pt to benefit from acute PT to address deficits. Pt required light physical assist for steadying and guiding from bed to recliner, pt with use of RW and appears generally unsteady in standing at high risk for falls. PT recommending SNF upon d/c from acute setting to address deficits, per pt she was progressing well with mobility PTA. PT to progress mobility as tolerated, and will continue to follow acutely.      Follow Up Recommendations SNF    Equipment Recommendations  None recommended by PT    Recommendations for Other Services       Precautions / Restrictions Precautions Precautions: Fall Restrictions Weight Bearing Restrictions: No      Mobility  Bed Mobility Overal bed mobility: Needs Assistance Bed Mobility: Supine to Sit;Sit to Supine;Rolling Rolling: Min assist   Supine to sit: Min guard Sit to supine: Modified independent (Device/Increase time)   General bed mobility comments: min assist for rolling for completion of trunk translation during pericare due to stool incotinence, pt with use of bedrails to perform. Min guard for safety for supine>sit, use of bedrails and  increased time.  Transfers Overall transfer level: Needs assistance Equipment used: Rolling walker (2 wheeled) Transfers: Sit to/from [facility] Sit to Stand: Min assist Stand pivot transfers: Min assist       General transfer comment: Min assist for steadying upon standing, verbal cuing for hand placement when rising. Pt able to take R lateral steps and make small pivotal steps to recliner, requiring steadying assist during transfer to recliner.  Ambulation/Gait                Stairs            Wheelchair Mobility    Modified Rankin (Stroke Patients Only)       Balance Overall balance assessment: Needs assistance Sitting-balance support: No upper extremity supported;Feet supported Sitting balance-[name] Scale: Good     Standing balance support: Bilateral upper extremity supported Standing balance-[name] Scale: Poor Standing balance comment: reliant on external support in standing                             Pertinent Vitals/Pain Pain Assessment: No/denies pain    Home Living Family/patient expects to be discharged to:: Skilled nursing facility                 Additional Comments: prior to initial admit, pt residing in [facility], pt had d/c to SNF x1 day prior to this admission    Prior Function Level of Independence: Needs assistance   Gait / Transfers Assistance Needed: pt reports using RW for ambulation.  ADL's / Homemaking Assistance Needed: Pt reports needing assist for dressing, bathing, meals delivered to her.  Comments: history of falls  Hand Dominance   Dominant Hand: Right    Extremity/Trunk Assessment   Upper Extremity Assessment Upper Extremity Assessment: Overall WFL for tasks assessed(per OT)    Lower Extremity Assessment Lower Extremity Assessment: Generalized weakness    Cervical / Trunk Assessment Cervical / Trunk Assessment: Normal  Communication   Communication: HOH  Cognition  Arousal/Alertness: Awake/alert Behavior During Therapy: WFL for tasks assessed/performed Overall Cognitive Status: Within Functional Limits for tasks assessed                                 General Comments: Pt answers questions appropriately, follows commands well, at times requires repeated questions/statements due to [facility]      General Comments General comments (skin integrity, edema, etc.): Pt on 3LO2 via [place] at baseline    Exercises     Assessment/Plan    PT Assessment Patient needs continued PT services  PT Problem List Decreased strength;Decreased mobility;Decreased safety awareness;Decreased activity tolerance;Decreased balance;Decreased knowledge of use of DME;Cardiopulmonary status limiting activity       PT Treatment Interventions DME instruction;Therapeutic activities;Gait training;Therapeutic exercise;Patient/family education;Balance training;Stair training;Functional mobility training    PT Goals (Current goals can be found in the Care Plan section)  Acute Rehab PT Goals Patient Stated Goal: none stated PT Goal Formulation: With patient Time For Goal Achievement: 08/12/19 Potential to Achieve Goals: Good    Frequency Min 2X/week   Barriers to discharge        Co-evaluation               AM-PAC PT "6 Clicks" Mobility  Outcome Measure Help needed turning from your back to your side while in a flat bed without using bedrails?: A Little Help needed moving from lying on your back to sitting on the side of a flat bed without using bedrails?: A Little Help needed moving to and from a bed to a chair (including a wheelchair)?: A Little Help needed standing up from a chair using your arms (e.g., wheelchair or bedside chair)?: A Little Help needed to walk in hospital room?: A Lot Help needed climbing 3-5 steps with a railing? : Total 6 Click Score: 15    End of Session Equipment Utilized During Treatment: Gait belt;Oxygen Activity Tolerance:  Patient tolerated treatment well;Patient limited by fatigue Patient left: in chair;with chair alarm set;with call bell/phone within reach Nurse Communication: Mobility status PT Visit Diagnosis: Muscle weakness (generalized) (M62.81);Difficulty in walking, not elsewhere classified (R26.2)    Time: 1130-1150 PT Time Calculation (min) (ACUTE ONLY): 20 min   Charges:   PT Evaluation $PT Eval Low Complexity: 1 Low         Skyy Nilan E, PT Acute Rehabilitation Services Pager [phone]  Office [phone]  Blondina Coderre D Despina Hidden 07/29/2019, 2:40 PM

## 2019-07-29 NOTE — Progress Notes (Signed)
Handoff report given part shift due to census restructure. Report given to Pat Patrick, patient A/O to person, place, and situation, 3L O2 as per home order.

## 2019-07-29 NOTE — Progress Notes (Signed)
MEDICATION RELATED CONSULT NOTE - INITIAL   Pharmacy Consult for IV Iron Indication: anemia  No Known Allergies  Patient Measurements: Height: 5\' 3"  (160 cm) Weight: 200 lb 14.4 oz (91.1 kg) IBW/kg (Calculated) : 52.4   Vital Signs: Temp: 98.6 F (37 C) (02/06 0447) Temp Source: Oral (02/06 0447) BP: 104/53 (02/06 0447) Pulse Rate: 70 (02/06 0447) Intake/Output from previous day: 02/05 0701 - 02/06 0700 In: 962 [I.V.:400; Blood:562] Out: 900 [Urine:900] Intake/Output from this shift: Total I/O In: -  Out: 900 [Urine:900]  Labs: Recent Labs    07/27/19 1640 07/27/19 2053 07/28/19 0042 07/28/19 1935 07/29/19 0213  WBC 5.1  --  5.3  --  4.9  HGB 7.3*   < > 7.0* 9.3* 8.6*  HCT 28.2*   < > 26.5* 32.9* 31.3*  PLT 219  --  218  --  220  CREATININE 1.00  --  0.87  --  0.78  MG 1.7  --  1.9  --   --   ALBUMIN 3.3*  --  3.2*  --   --   PROT 6.4*  --  5.8*  --   --   AST 17  --  14*  --   --   ALT 13  --  11  --   --   ALKPHOS 40  --  37*  --   --   BILITOT 0.8  --  0.9  --   --    < > = values in this interval not displayed.   Estimated Creatinine Clearance: 63.1 mL/min (by C-G formula based on SCr of 0.78 mg/dL).   Microbiology: Recent Results (from the past 720 hour(s))  SARS CORONAVIRUS 2 (TAT 6-24 HRS) Nasopharyngeal Nasopharyngeal Swab     Status: None   Collection Time: 07/27/19  7:24 PM   Specimen: Nasopharyngeal Swab  Result Value Ref Range Status   SARS Coronavirus 2 NEGATIVE NEGATIVE Final    Comment: (NOTE) SARS-CoV-2 target nucleic acids are NOT DETECTED. The SARS-CoV-2 RNA is generally detectable in upper and lower respiratory specimens during the acute phase of infection. Negative results do not preclude SARS-CoV-2 infection, do not rule out co-infections with other pathogens, and should not be used as the sole basis for treatment or other patient management decisions. Negative results must be combined with clinical observations, patient  history, and epidemiological information. The expected result is Negative. Fact Sheet for Patients: 09/24/19 Fact Sheet for Healthcare Providers: HairSlick.no This test is not yet approved or cleared by the quierodirigir.com FDA and  has been authorized for detection and/or diagnosis of SARS-CoV-2 by FDA under an Emergency Use Authorization (EUA). This EUA will remain  in effect (meaning this test can be used) for the duration of the COVID-19 declaration under Section 56 4(b)(1) of the Act, 21 U.S.C. section 360bbb-3(b)(1), unless the authorization is terminated or revoked sooner. Performed at [facility], [address], [place], [place]   MRSA PCR Screening     Status: Abnormal   Collection Time: 07/28/19  7:07 PM   Specimen: Nasal Mucosa; Nasopharyngeal  Result Value Ref Range Status   MRSA by PCR POSITIVE (A) NEGATIVE Final    Comment:        The GeneXpert MRSA Assay (FDA approved for NASAL specimens only), is one component of a comprehensive MRSA colonization surveillance program. It is not intended to diagnose MRSA infection nor to guide or monitor treatment for MRSA infections. RESULT CALLED TO, READ BACK  BY AND VERIFIED WITH: Mare Ferrari, RN ON 07/28/19 @ 2147 BY LE Performed at [facility] [address] [place], [place] [address]     Medical History: Past Medical History:  Diagnosis Date  . Chronic diastolic congestive heart failure ([place])   . COPD (chronic obstructive pulmonary disease) ([place])   . Diabetes mellitus without complication ([place])   . Hyperlipidemia   . Hypertension   . Hypothyroidism   . Morbid obesity ([place])   . Paroxysmal A-fib ([place])   . Pulmonary hypertension (HCC)     Medications:  Scheduled:  . sodium chloride   Intravenous Once  . Chlorhexidine Gluconate Cloth  6 each Topical Q0600  . insulin aspart  0-9 Units Subcutaneous Q6H  .  mometasone-formoterol  2 puff Inhalation BID  . mupirocin ointment  1 application Nasal BID  . pantoprazole (PROTONIX) IV  40 mg Intravenous Q12H  . sodium chloride flush  3 mL Intravenous Q12H  . umeclidinium bromide  1 puff Inhalation Daily   Infusions:  . lactated ringers 10 mL/hr at 07/28/19 1709    Assessment: 22 yoF on chronic eliquis admitted with low hgb. Hgb 9.3>8.6    Plan:  Faraheme 510 mg x1 rx will sign off consult   Dorrene German 07/29/2019,5:59 AM

## 2019-07-29 NOTE — Progress Notes (Signed)
Occupational Therapy Evaluation Patient Details Name: Sheila Hayes MRN: [id number] DOB: 1940-09-12 Today's Date: 07/29/2019    History of Present Illness 79 yo female admitted to ED from [facility] for anemia requiring transfusion secondary to GIB. PMH includes dementia, paroxysmal atrial fibrillation chronically anticoagulated on Eliquis since November '20, chronic diastolic heart failure, COPD, chronic hypoxic respiratory failure on continuous 3 L [place], HTN, DM2, and COVID-19 infection in November '20 prompting hospitalization at that time, chronic anemia with baseline Hgb 9-10.   Clinical Impression   Pt presents with above diagnosis. PTA pt PLOF living at [facility] requiring assistance for ADLs with use of RW or cane for functional transfers and mobility. Pt currently limited with weakness, fatigued, and instability with ADLs and IADLs. Pt will benefit from additional acute OT to address established deficits to maximize independence prior to dc. DC recommendation to SNF to improve ADL function and strength. OT will continue to follow acutely.      Follow Up Recommendations  Supervision/Assistance - 24 hour;SNF    Equipment Recommendations  None recommended by OT(defer to dc setting)    Recommendations for Other Services       Precautions / Restrictions Precautions Precautions: Fall Restrictions Weight Bearing Restrictions: No      Mobility Bed Mobility Overal bed mobility: Needs Assistance Bed Mobility: Supine to Sit;Sit to Supine;Rolling Rolling: Min assist   Supine to sit: Min guard Sit to supine: Modified independent (Device/Increase time)   General bed mobility comments: min assist for rolling for completion of trunk translation during pericare due to stool incotinence, pt with use of bedrails to perform. Min guard for safety for supine>sit, use of bedrails and increased time.  Transfers Overall transfer level: Needs assistance Equipment used: Rolling walker  (2 wheeled) Transfers: Sit to/from [employer] Sit to Stand: Min assist Stand pivot transfers: Min assist       General transfer comment: Min assist for steadying upon standing, verbal cuing for hand placement when rising. Pt able to take R lateral steps and make small pivotal steps to recliner, requiring steadying assist during transfer to recliner.    Balance Overall balance assessment: Needs assistance Sitting-balance support: No upper extremity supported;Feet supported Sitting balance-[name] Scale: Good     Standing balance support: Bilateral upper extremity supported Standing balance-[name] Scale: Poor Standing balance comment: reliant on external support in standing                           ADL either performed or assessed with clinical judgement   ADL Overall ADL's : Needs assistance/impaired Eating/Feeding: Independent;Sitting;Bed level   Grooming: Wash/dry hands;Wash/dry face;Oral care;Set up;Sitting   Upper Body Bathing: Set up;Sitting   Lower Body Bathing: Minimal assistance;Sitting/lateral leans   Upper Body Dressing : Set up;Sitting   Lower Body Dressing: Minimal assistance;Sitting/lateral leans   Toilet Transfer: Minimal assistance;Ambulation;RW Toilet Transfer Details (indicate cue type and reason): No physical assistance required for sit to stand transfer and simulated toilet transfer however Min A for safety with RW and sequencing.          Functional mobility during ADLs: Minimal assistance       Vision         Perception     Praxis      Pertinent Vitals/Pain Pain Assessment: No/denies pain     Hand Dominance Right   Extremity/Trunk Assessment Upper Extremity Assessment Upper Extremity Assessment: Overall WFL for tasks assessed(per OT)   Lower  Extremity Assessment Lower Extremity Assessment: Generalized weakness   Cervical / Trunk Assessment Cervical / Trunk Assessment: Normal   Communication  Communication Communication: HOH   Cognition Arousal/Alertness: Awake/alert Behavior During Therapy: WFL for tasks assessed/performed Overall Cognitive Status: Within Functional Limits for tasks assessed                                 General Comments: Pt answers questions appropriately, follows commands well, at times requires repeated questions/statements due to [place] Comments  Pt on 3LO2 via [place] at baseline    Exercises     Shoulder Instructions      Home Living Family/patient expects to be discharged to:: Skilled nursing facility                                 Additional Comments: prior to initial admit, pt residing in [facility], pt had d/c to SNF x1 day prior to this admission      Prior Functioning/Environment Level of Independence: Needs assistance  Gait / Transfers Assistance Needed: pt reports using RW for ambulation. ADL's / Homemaking Assistance Needed: Pt reports needing assist for dressing, bathing, meals delivered to her.   Comments: history of falls        OT Problem List: Decreased activity tolerance;Impaired balance (sitting and/or standing);Decreased safety awareness      OT Treatment/Interventions: Self-care/ADL training;Therapeutic exercise;Therapeutic activities;Patient/family education;Balance training    OT Goals(Current goals can be found in the care plan section) Acute Rehab OT Goals Patient Stated Goal: none stated OT Goal Formulation: With patient Time For Goal Achievement: 08/12/19 Potential to Achieve Goals: Fair  OT Frequency: Min 2X/week   Barriers to D/C:            Co-evaluation              AM-PAC OT "6 Clicks" Daily Activity     Outcome Measure Help from another person eating meals?: None Help from another person taking care of personal grooming?: A Little Help from another person toileting, which includes using toliet, bedpan, or urinal?: A Little Help from another person  bathing (including washing, rinsing, drying)?: A Little Help from another person to put on and taking off regular upper body clothing?: A Little Help from another person to put on and taking off regular lower body clothing?: A Little 6 Click Score: 19   End of Session Equipment Utilized During Treatment: Gait belt;Rolling walker Nurse Communication: Mobility status  Activity Tolerance: Patient tolerated treatment well Patient left: in bed;with call bell/phone within reach;with bed alarm set  OT Visit Diagnosis: Unsteadiness on feet (R26.81);Muscle weakness (generalized) (M62.81);History of falling (Z91.81)                Time: 1601-0932 OT Time Calculation (min): 14 min Charges:  OT General Charges $OT Visit: 1 Visit OT Evaluation $OT Eval Low Complexity: [place], MSOT, OTR/L  Supplemental Rehabilitation Services  [phone]   Marius Ditch 07/29/2019, 2:47 PM

## 2019-07-29 NOTE — Progress Notes (Signed)
PROGRESS NOTE  Sheila Hayes EBX:[id number] DOB: [birth year] DOA: 07/27/2019 PCP: Orvis Brill, [facility]  HPI/Recap of past 24 hours: HPI from Dr Velia Meyer Sheila Hayes is a 79 y.o. female with medical history significant for dementia, paroxysmal atrial fibrillation chronically anticoagulated on Eliquis since November '20, chronic diastolic heart failure, COPD, chronic hypoxic respiratory failure on continuous 3 L [place], HTN, DM2, and COVID-19 infection in November '20 prompting hospitalization admitted for evaluation of low hemoglobin identified as an outpatient.  In the ED, vital signs stable, on chronic home O2 3 L saturating around 100%.  Labs showed hemoglobin 7.3, last here on file was 7.7 on 05/12/2019, FOBT positive.  EDP discussed with GI.  Patient admitted for further management.    Today, met patient resting comfortably in bed, denies any new complaints.  Denies any obvious signs of bleeding.  Denies any abdominal pain, nausea/vomiting, fever/chills, chest pain, worsening shortness of breath.    Assessment/Plan: Principal Problem:   Acute lower GI bleeding Active Problems:   HTN (hypertension)   Diabetes (HCC)   Chronic diastolic CHF (congestive heart failure) (HCC)   Hypothyroidism   Acute on chronic anemia  Upper GI bleed likely 2/2 single ulcerated gastric polyp Hemoglobin trended down to 7.3 on admission FOBT positive GI on board, EGD done on 07/28/2019 showed a single gastric polyp with ulceration and hematin, resected, clips were placed.  Also noted multiple gastric polyps.  Biopsied.  GI recommended clear liquid diet, PPI to 40 mg twice daily, hold off anticoagulation for at least 48 hours or may restart Heparin GTT in 12 hours may be considered.  Await pathology results. KUB, chest x-ray post EGD unremarkable Continue PPI Monitor closely  Acute on chronic blood loss anemia Likely 2/2 above, hemoglobin 7.3 on admission Hemoglobin baseline between  8-9 Anemia panel showed iron 27, sat 7, ferritin 9, folate 10.7 Transfused 2 units of PRBC on 07/28/2019 S/P IV Feraheme on 07/29/2019, continue oral iron supplementation Continue to hold Eliquis for now Daily CBC  Paroxysmal A. Fib Currently heart rate controlled Continue to hold Lopressor for now Continue to hold Eliquis for now  Chronic diastolic HF Appears stable Echo in 2019 showed EF of 55 to 60% Continue to hold home Lasix Strict I's and O's Daily weights  Diabetes mellitus type 2 Last A1c 5.4 on 04/2019 Continue SSI, Accu-Cheks, hypoglycemic protocol Hold home glipizide, Metformin for now  History of COVID-19 pneumonia Diagnosed on 05/02/2019 Currently stable on 3 L of home O2 Afebrile, no leukocytosis Monitor closely         Malnutrition Type:      Malnutrition Characteristics:      Nutrition Interventions:       Estimated body mass index is 35.59 kg/m as calculated from the following:   Height as of this encounter: 5' 3"  (1.6 m).   Weight as of this encounter: 91.1 kg.     Code Status: DNR  Family Communication: None at bedside  Disposition Plan: Came from SNF, likely DC back to SNF once work-up and management complete in about 24 to 48 hours.   Consultants:  GI  Procedures:  EGD on 07/28/2019  Antimicrobials:  None  DVT prophylaxis: SCDs   Objective: Vitals:   07/28/19 2010 07/28/19 2110 07/29/19 0447 07/29/19 1216  BP: (!) 104/57  (!) 104/53 (!) 111/54  Pulse: 68  70 66  Resp: 18  18 20   Temp: 98.3 F (36.8 C)  98.6 F (37 C) 97.7 F (36.5 C)  TempSrc: Oral  Oral Oral  SpO2: 91% 92% 94% 97%  Weight:   91.1 kg   Height:        Intake/Output Summary (Last 24 hours) at 07/29/2019 1820 Last data filed at 07/29/2019 1800 Gross per 24 hour  Intake 200 ml  Output 1600 ml  Net -1400 ml   Filed Weights   07/28/19 0500 07/29/19 0447  Weight: 90.1 kg 91.1 kg    Exam:  General: NAD   Cardiovascular: S1, S2  present  Respiratory: CTAB  Abdomen: Soft, nontender, nondistended, bowel sounds present  Musculoskeletal: No bilateral pedal edema noted  Skin: Normal  Psychiatry: Normal mood   Data Reviewed: CBC: Recent Labs  Lab 07/27/19 1640 07/27/19 2053 07/28/19 0042 07/28/19 1935 07/29/19 0213  WBC 5.1  --  5.3  --  4.9  NEUTROABS 3.5  --   --   --  3.2  HGB 7.3* 7.3* 7.0* 9.3* 8.6*  HCT 28.2* 27.4* 26.5* 32.9* 31.3*  MCV 83.2  --  82.6  --  83.7  PLT 219  --  218  --  720   Basic Metabolic Panel: Recent Labs  Lab 07/27/19 1640 07/28/19 0042 07/29/19 0213  NA 142 142 140  K 3.6 3.9 3.5  CL 95* 97* 97*  CO2 36* 36* 35*  GLUCOSE 155* 99 88  BUN 15 13 10   CREATININE 1.00 0.87 0.78  CALCIUM 8.8* 8.8* 8.6*  MG 1.7 1.9  --    GFR: Estimated Creatinine Clearance: 63.1 mL/min (by C-G formula based on SCr of 0.78 mg/dL). Liver Function Tests: Recent Labs  Lab 07/27/19 1640 07/28/19 0042  AST 17 14*  ALT 13 11  ALKPHOS 40 37*  BILITOT 0.8 0.9  PROT 6.4* 5.8*  ALBUMIN 3.3* 3.2*   No results for input(s): LIPASE, AMYLASE in the last 168 hours. No results for input(s): AMMONIA in the last 168 hours. Coagulation Profile: Recent Labs  Lab 07/27/19 1640 07/28/19 0042  INR 1.1 1.1   Cardiac Enzymes: No results for input(s): CKTOTAL, CKMB, CKMBINDEX, TROPONINI in the last 168 hours. BNP (last 3 results) No results for input(s): PROBNP in the last 8760 hours. HbA1C: No results for input(s): HGBA1C in the last 72 hours. CBG: Recent Labs  Lab 07/29/19 0112 07/29/19 0544 07/29/19 0759 07/29/19 1212 07/29/19 1639  GLUCAP 93 87 107* 172* 86   Lipid Profile: No results for input(s): CHOL, HDL, LDLCALC, TRIG, CHOLHDL, LDLDIRECT in the last 72 hours. Thyroid Function Tests: No results for input(s): TSH, T4TOTAL, FREET4, T3FREE, THYROIDAB in the last 72 hours. Anemia Panel: Recent Labs    07/27/19 1640 07/27/19 2053  FOLATE  --  10.7  FERRITIN  --  9*  TIBC   --  403  IRON  --  27*  RETICCTPCT 1.6  --    Urine analysis:    Component Value Date/Time   COLORURINE YELLOW 05/09/2019 0519   APPEARANCEUR CLEAR 05/09/2019 0519   APPEARANCEUR Clear 07/24/2013 1152   LABSPEC >1.046 (H) 05/09/2019 0519   LABSPEC 1.010 07/24/2013 1152   PHURINE 5.0 05/09/2019 0519   GLUCOSEU >=500 (A) 05/09/2019 0519   GLUCOSEU Negative 07/24/2013 1152   HGBUR NEGATIVE 05/09/2019 0519   BILIRUBINUR NEGATIVE 05/09/2019 0519   BILIRUBINUR Negative 07/24/2013 1152   KETONESUR 5 (A) 05/09/2019 0519   PROTEINUR NEGATIVE 05/09/2019 0519   NITRITE NEGATIVE 05/09/2019 0519   LEUKOCYTESUR NEGATIVE 05/09/2019 0519   LEUKOCYTESUR Negative 07/24/2013 1152   Sepsis Labs: @LABRCNTIP (procalcitonin:4,lacticidven:4)  )  Recent Results (from the past 240 hour(s))  SARS CORONAVIRUS 2 (TAT 6-24 HRS) Nasopharyngeal Nasopharyngeal Swab     Status: None   Collection Time: 07/27/19  7:24 PM   Specimen: Nasopharyngeal Swab  Result Value Ref Range Status   SARS Coronavirus 2 NEGATIVE NEGATIVE Final    Comment: (NOTE) SARS-CoV-2 target nucleic acids are NOT DETECTED. The SARS-CoV-2 RNA is generally detectable in upper and lower respiratory specimens during the acute phase of infection. Negative results do not preclude SARS-CoV-2 infection, do not rule out co-infections with other pathogens, and should not be used as the sole basis for treatment or other patient management decisions. Negative results must be combined with clinical observations, patient history, and epidemiological information. The expected result is Negative. Fact Sheet for Patients: SugarRoll.be Fact Sheet for Healthcare Providers: https://www.woods-mathews.com/ This test is not yet approved or cleared by the [place] FDA and  has been authorized for detection and/or diagnosis of SARS-CoV-2 by FDA under an Emergency Use Authorization (EUA). This EUA will remain  in  effect (meaning this test can be used) for the duration of the COVID-19 declaration under Section 56 4(b)(1) of the Act, 21 U.S.C. section 360bbb-3(b)(1), unless the authorization is terminated or revoked sooner. Performed at [facility], [place] [address], [place], [place] [address]   MRSA PCR Screening     Status: Abnormal   Collection Time: 07/28/19  7:07 PM   Specimen: Nasal Mucosa; Nasopharyngeal  Result Value Ref Range Status   MRSA by PCR POSITIVE (A) NEGATIVE Final    Comment:        The GeneXpert MRSA Assay (FDA approved for NASAL specimens only), is one component of a comprehensive MRSA colonization surveillance program. It is not intended to diagnose MRSA infection nor to guide or monitor treatment for MRSA infections. RESULT CALLED TO, READ BACK BY AND VERIFIED WITH: Mare Ferrari, RN ON 07/28/19 @ 2147 BY LE Performed at [facility] [address] [place], [place] [address]       Studies: No results found.  Scheduled Meds: . sodium chloride   Intravenous Once  . Chlorhexidine Gluconate Cloth  6 each Topical Q0600  . ferrous sulfate  325 mg Oral Q breakfast  . insulin aspart  0-9 Units Subcutaneous Q6H  . mometasone-formoterol  2 puff Inhalation BID  . mupirocin ointment  1 application Nasal BID  . pantoprazole (PROTONIX) IV  40 mg Intravenous Q12H  . polyethylene glycol  17 g Oral Daily  . sodium chloride flush  3 mL Intravenous Q12H  . umeclidinium bromide  1 puff Inhalation Daily    Continuous Infusions: . lactated ringers 10 mL/hr at 07/28/19 1709     LOS: 1 day     Alma Friendly, MD [facility]  If 7PM-7AM, please contact night-coverage [email or web address] 07/29/2019, 6:20 PM

## 2019-07-30 LAB — BASIC METABOLIC PANEL
Anion gap: 7 (ref 5–15)
BUN: 6 mg/dL — ABNORMAL LOW (ref 8–23)
CO2: 35 mmol/L — ABNORMAL HIGH (ref 22–32)
Calcium: 8.8 mg/dL — ABNORMAL LOW (ref 8.9–10.3)
Chloride: 99 mmol/L (ref 98–111)
Creatinine, Ser: 0.76 mg/dL (ref 0.44–1.00)
GFR calc Af Amer: >60 mL/min (ref >60)
GFR calc non Af Amer: >60 mL/min (ref >60)
Glucose, Bld: 93 mg/dL (ref 70–99)
Potassium: 3.6 mmol/L (ref 3.5–5.1)
Sodium: 141 mmol/L (ref 135–145)

## 2019-07-30 LAB — CBC WITH DIFFERENTIAL/PLATELET
Abs Immature Granulocytes: 0.01 10*3/uL (ref 0.00–0.07)
Basophils Absolute: 0 10*3/uL (ref 0.0–0.1)
Basophils Relative: 1 %
Eosinophils Absolute: 0 10*3/uL (ref 0.0–0.5)
Eosinophils Relative: 1 %
HCT: 32.5 % — ABNORMAL LOW (ref 36.0–46.0)
Hemoglobin: 9.1 g/dL — ABNORMAL LOW (ref 12.0–15.0)
Immature Granulocytes: 0 %
Lymphocytes Relative: 21 %
Lymphs Abs: 0.9 10*3/uL (ref 0.7–4.0)
MCH: 23.5 pg — ABNORMAL LOW (ref 26.0–34.0)
MCHC: 28 g/dL — ABNORMAL LOW (ref 30.0–36.0)
MCV: 83.8 fL (ref 80.0–100.0)
Monocytes Absolute: 0.3 10*3/uL (ref 0.1–1.0)
Monocytes Relative: 7 %
Neutro Abs: 2.8 10*3/uL (ref 1.7–7.7)
Neutrophils Relative %: 70 %
Platelets: 193 10*3/uL (ref 150–400)
RBC: 3.88 MIL/uL (ref 3.87–5.11)
RDW: 15.9 % — ABNORMAL HIGH (ref 11.5–15.5)
WBC: 4 10*3/uL (ref 4.0–10.5)
nRBC: 0 % (ref 0.0–0.2)

## 2019-07-30 LAB — GLUCOSE, CAPILLARY
Glucose-Capillary: 88 mg/dL (ref 70–99)
Glucose-Capillary: 189 mg/dL — ABNORMAL HIGH (ref 70–99)
Glucose-Capillary: 95 mg/dL (ref 70–99)

## 2019-07-30 MED ORDER — FUROSEMIDE 20 MG PO TABS
20.0000 mg | ORAL_TABLET | Freq: Every day | ORAL | Status: DC
  Administered 2019-07-30: 11:00:00 20 mg via ORAL
  Filled 2019-07-30: qty 1

## 2019-07-30 MED ORDER — APIXABAN 5 MG PO TABS
5.0000 mg | ORAL_TABLET | Freq: Two times a day (BID) | ORAL | Status: DC
  Administered 2019-07-30: 11:00:00 5 mg via ORAL
  Filled 2019-07-30: qty 1

## 2019-07-30 MED ORDER — CALCIUM CARBONATE 1250 (500 CA) MG PO TABS: 500.0000 mg | ORAL_TABLET | Freq: Two times a day (BID) | ORAL | Status: DC

## 2019-07-30 MED ORDER — FERROUS SULFATE 325 (65 FE) MG PO TABS: 325.0000 mg | ORAL_TABLET | Freq: Every day | ORAL | 0 refills | Status: AC

## 2019-07-30 MED ORDER — PANTOPRAZOLE SODIUM 40 MG PO TBEC: 40.0000 mg | DELAYED_RELEASE_TABLET | Freq: Two times a day (BID) | ORAL | 0 refills | Status: AC

## 2019-07-30 MED ORDER — LEVOTHYROXINE SODIUM 25 MCG PO TABS
125.0000 ug | ORAL_TABLET | Freq: Every day | ORAL | Status: DC
  Administered 2019-07-30: 11:00:00 125 ug via ORAL
  Filled 2019-07-30: qty 1

## 2019-07-30 NOTE — TOC Transition Note (Addendum)
Transition of Care [facility]) - CM/SW Discharge Note   Patient Details  Name: Sheila Hayes MRN: [id number] Date of Birth: 04-May-1941  [facility] CM/SW Contact:  Coralyn Helling, LCSW Phone Number: 07/30/2019, 11:15 AM   Clinical Narrative:   Patient returning to [facility]. Patient can transport after 4pm. Patient will report to room 1206P [place]. RN report #: [phone] ask for [employer]. EMS arranged for 4:30.  Patient is pending medicaid with no payor source. [facility] agreed to take patient back as she has been there since early Jan without benefits.     Final next level of care: Skilled Nursing Facility Barriers to Discharge: No Barriers Identified   Patient Goals and CMS Choice     Choice offered to / list presented to : NA  Discharge Placement                Patient to be transferred to facility by: EMS Name of family member notified: Sister Patient and family notified of of transfer: 07/30/19  Discharge Plan and Services                                     Social Determinants of Health (SDOH) Interventions     Readmission Risk Interventions Readmission Risk Prevention Plan 09/04/2018 09/02/2018 02/28/2018  Transportation Screening Complete Complete Complete  PCP or Specialist Appt within 3-5 Days Complete Complete (No Data)  Home Care Screening - - Complete  HRI or Home Care Consult Complete Complete Complete  Social Work Consult for Recovery Care Planning/Counseling Complete Not Complete Not Complete  SW consult not completed comments - NA -  Palliative Care Screening Complete Complete Not Complete  Medication Review [employer]) Complete Complete Complete  Some recent data might be hidden

## 2019-07-30 NOTE — Progress Notes (Signed)
Made 6 attempts to call [facility] for discharge report. No answer to the phone. AVS sent with PTAR on transfer. Melton Alar, RN

## 2019-07-30 NOTE — Discharge Summary (Signed)
Discharge Summary  Sheila Hayes ZTI:[id number] DOB: 06/11/1941  PCP: [facility]  Admit date: 07/27/2019 Discharge date: 07/30/2019  Time spent: 40 mins  Recommendations for Outpatient Follow-up:  1. Follow-up with GI as scheduled 2. Follow-up with PCP  Discharge Diagnoses:  Active Hospital Problems   Diagnosis Date Noted  . Acute lower GI bleeding 07/27/2019  . Acute on chronic anemia 07/28/2019  . HTN (hypertension) 10/09/2017  . Diabetes (HCC) 10/09/2017  . Chronic diastolic CHF (congestive heart failure) (HCC) 10/09/2017  . Hypothyroidism 10/09/2017    Resolved Hospital Problems  No resolved problems to display.    Discharge Condition: Stable  Diet recommendation: Heart healthy/modified carb  Vitals:   07/30/19 0632 07/30/19 0842  BP: (!) 122/58   Pulse: 65   Resp: 18   Temp: 98.7 F (37.1 C)   SpO2: 93% 96%    History of present illness:  Sheila Hayes a 79 y.o.femalewith medical history significant fordementia, paroxysmal atrial fibrillation chronically anticoagulated on Eliquis since November '20, chronic diastolic heart failure, COPD, chronic hypoxic respiratory failure on continuous 3 L [place], HTN, DM2, and COVID-19 infection in November '20 prompting hospitalization admittedfor evaluation of low hemoglobin identified as an outpatient.  In the ED, vital signs stable, on chronic home O2 3 L saturating around 100%.  Labs showed hemoglobin 7.3, last here on file was 7.7 on 05/12/2019, FOBT positive.  EDP discussed with GI.  Patient admitted for further management.    Today, patient denies any new complaints, noted to be sleeping, but easily arousable.  Denies any abdominal pain, no obvious signs of bleeding, chest pain, worsening shortness of breath, nausea/vomiting, fever/chills.   Hospital Course:  Principal Problem:   Acute lower GI bleeding Active Problems:   HTN (hypertension)   Diabetes (HCC)   Chronic diastolic CHF  (congestive heart failure) (HCC)   Hypothyroidism   Acute on chronic anemia   Upper GI bleed likely 2/2 single ulcerated gastric polyp Hemoglobin trended down to 7.3 on admission FOBT positive GI on board, EGD done on 07/28/2019 showed a single gastric polyp with ulceration and hematin, resected, clips were placed.  Also noted multiple gastric polyps.  Biopsied.  GI recommended PPI to 40 mg twice daily, hold off anticoagulation for at least 48 hours post EGD. Await pathology results. KUB, chest x-ray post EGD unremarkable Continue PPI Follow-up with GI as scheduled Monitor CBC closely as Eliquis is restarted  Acute on chronic blood loss anemia Likely 2/2 above, hemoglobin 7.3 on admission Hemoglobin baseline between 8-9 Anemia panel showed iron 27, sat 7, ferritin 9, folate 10.7 Transfused 2 units of PRBC on 07/28/2019 S/P IV Feraheme on 07/29/2019, continue oral iron supplementation  Paroxysmal A. Fib Currently heart rate controlled Restart Lopressor Restarted Eliquis on 07/30/2019  Chronic diastolic HF Appears stable Echo in 2019 showed EF of 55 to 60% Continue home Lasix  Diabetes mellitus type 2 Last A1c 5.4 on 04/2019 Continue home glipizide, Metformin  History of COVID-19 pneumonia Diagnosed on 05/02/2019 Currently stable on 3 L of home O2 Remains afebrile, currently asymptomatic       Malnutrition Type:      Malnutrition Characteristics:      Nutrition Interventions:      Estimated body mass index is 35.59 kg/m as calculated from the following:   Height as of this encounter: 5\' 3"  (1.6 m).   Weight as of this encounter: 91.1 kg.    Procedures:  EGD on 07/28/2019  Consultations:  GI  Discharge Exam: BP (!) 122/58 (BP Location: Right Arm)   Pulse 65   Temp 98.7 F (37.1 C) (Oral)   Resp 18   Ht 5\' 3"  (1.6 m)   Wt 91.1 kg   SpO2 96%   BMI 35.59 kg/m   General: NAD Cardiovascular: S1, S2 present Respiratory: CTAB Abdomen: Soft,  nontender, nondistended, bowel sounds present  Discharge Instructions You were cared for by a hospitalist during your hospital stay. If you have any questions about your discharge medications or the care you received while you were in the hospital after you are discharged, you can call the unit and asked to speak with the hospitalist on call if the hospitalist that took care of you is not available. Once you are discharged, your primary care physician will handle any further medical issues. Please note that NO REFILLS for any discharge medications will be authorized once you are discharged, as it is imperative that you return to your primary care physician (or establish a relationship with a primary care physician if you do not have one) for your aftercare needs so that they can reassess your need for medications and monitor your lab values.  Discharge Instructions    Diet - low sodium heart healthy   Complete by: As directed    Increase activity slowly   Complete by: As directed      Allergies as of 07/30/2019   No Known Allergies     Medication List    STOP taking these medications   omeprazole 20 MG capsule Commonly known as: PRILOSEC     TAKE these medications   acetaminophen 325 MG tablet Commonly known as: TYLENOL Take 2 tablets (650 mg total) by mouth every 6 (six) hours as needed for mild pain or headache (fever >/= 101).   albuterol 108 (90 Base) MCG/ACT inhaler Commonly known as: VENTOLIN HFA Inhale 2 puffs into the lungs every 6 (six) hours as needed for wheezing or shortness of breath.   apixaban 5 MG Tabs tablet Commonly known as: ELIQUIS Take 1 tablet (5 mg total) by mouth 2 (two) times daily.   budesonide-formoterol 80-4.5 MCG/ACT inhaler Commonly known as: SYMBICORT Inhale 2 puffs into the lungs 2 (two) times daily.   calcium carbonate 1500 (600 Ca) MG Tabs tablet Commonly known as: OSCAL Take 600 mg of elemental calcium by mouth 2 (two) times daily with a  meal.   docusate sodium 100 MG capsule Commonly known as: COLACE Take 200 mg by mouth every other day.   ferrous sulfate 325 (65 FE) MG tablet Take 1 tablet (325 mg total) by mouth daily with breakfast. Start taking on: July 31, 2019   furosemide 20 MG tablet Commonly known as: LASIX Take 20 mg by mouth daily.   gabapentin 100 MG capsule Commonly known as: NEURONTIN Take 200 mg by mouth 2 (two) times daily.   insulin lispro 100 UNIT/ML injection Commonly known as: HUMALOG Inject 0-8 Units into the skin 3 (three) times daily before meals. 0-200= 0 units 201-250=2 units 251-300=4 units 301-350=6 units 351-400=8 units   levothyroxine 125 MCG tablet Commonly known as: SYNTHROID Take 125 mcg by mouth daily before breakfast.   metoprolol succinate 25 MG 24 hr tablet Commonly known as: TOPROL-XL Take 12.5 mg by mouth daily.   nicotine 14 mg/24hr patch Commonly known as: NICODERM CQ - dosed in mg/24 hours Place 14 mg onto the skin daily.   nystatin powder Generic drug: nystatin Apply topically 2 (two) times daily as  needed. For fungal growth   OXYGEN Inhale 3 L into the lungs continuous.   pantoprazole 40 MG tablet Commonly known as: Protonix Take 1 tablet (40 mg total) by mouth 2 (two) times daily before a meal.   potassium chloride 10 MEQ tablet Commonly known as: KLOR-CON Take 10 mEq by mouth daily.   rosuvastatin 40 MG tablet Commonly known as: CRESTOR Take 20 mg by mouth every evening.   tiotropium 18 MCG inhalation capsule Commonly known as: SPIRIVA Place 18 mcg into inhaler and inhale daily.      No Known Allergies Follow-up Information    [facility] Schedule an appointment as soon as possible for a visit in 1 week(s).   Specialty: Geriatric Medicine Contact information: [address] [place] [address] [phone]            The results of significant diagnostics from this hospitalization (including  imaging, microbiology, ancillary and laboratory) are listed below for reference.    Significant Diagnostic Studies: DG Chest Port 1 View  Result Date: 07/28/2019 CLINICAL DATA:  Nausea and vomiting after procedure EXAM: PORTABLE CHEST 1 VIEW COMPARISON:  05/08/2019 FINDINGS: Single frontal view of the chest demonstrates chronic enlargement of cardiac silhouette. There is chronic interstitial scarring without acute airspace disease, effusion, or pneumothorax. No acute bony abnormalities. IMPRESSION: 1. Diffuse interstitial prominence compatible with chronic scarring. No acute airspace disease. Electronically Signed   By: Sharlet Salina M.D.   On: 07/28/2019 14:41   DG Abd Portable 2V  Result Date: 07/28/2019 CLINICAL DATA:  Polyp removal, nausea and vomiting after procedure EXAM: PORTABLE ABDOMEN - 2 VIEW COMPARISON:  05/14/2016 FINDINGS: Supine and left lateral decubitus views of the abdomen are obtained. No free intraperitoneal gas. Unremarkable bowel gas pattern without signs of obstruction. Barium filled diverticula are seen within the transverse colon. Moderate lower lumbar spondylosis. No acute fractures. IMPRESSION: 1. No free intraperitoneal gas. 2. Unremarkable bowel gas pattern. Electronically Signed   By: Sharlet Salina M.D.   On: 07/28/2019 14:44    Microbiology: Recent Results (from the past 240 hour(s))  SARS CORONAVIRUS 2 (TAT 6-24 HRS) Nasopharyngeal Nasopharyngeal Swab     Status: None   Collection Time: 07/27/19  7:24 PM   Specimen: Nasopharyngeal Swab  Result Value Ref Range Status   SARS Coronavirus 2 NEGATIVE NEGATIVE Final    Comment: (NOTE) SARS-CoV-2 target nucleic acids are NOT DETECTED. The SARS-CoV-2 RNA is generally detectable in upper and lower respiratory specimens during the acute phase of infection. Negative results do not preclude SARS-CoV-2 infection, do not rule out co-infections with other pathogens, and should not be used as the sole basis for treatment or  other patient management decisions. Negative results must be combined with clinical observations, patient history, and epidemiological information. The expected result is Negative. Fact Sheet for Patients: HairSlick.no Fact Sheet for Healthcare Providers: quierodirigir.com This test is not yet approved or cleared by the [place] FDA and  has been authorized for detection and/or diagnosis of SARS-CoV-2 by FDA under an Emergency Use Authorization (EUA). This EUA will remain  in effect (meaning this test can be used) for the duration of the COVID-19 declaration under Section 56 4(b)(1) of the Act, 21 U.S.C. section 360bbb-3(b)(1), unless the authorization is terminated or revoked sooner. Performed at [facility], [address] [place], [place] [address]   MRSA PCR Screening     Status: Abnormal   Collection Time: 07/28/19  7:07 PM  Specimen: Nasal Mucosa; Nasopharyngeal  Result Value Ref Range Status   MRSA by PCR POSITIVE (A) NEGATIVE Final    Comment:        The GeneXpert MRSA Assay (FDA approved for NASAL specimens only), is one component of a comprehensive MRSA colonization surveillance program. It is not intended to diagnose MRSA infection nor to guide or monitor treatment for MRSA infections. RESULT CALLED TO, READ BACK BY AND VERIFIED WITH: Bascom Levels, RN ON 07/28/19 @ 2147 BY LE Performed at [facility], [address], [place], [place] [address]      Labs: Basic Metabolic Panel: Recent Labs  Lab 07/27/19 1640 07/28/19 0042 07/29/19 0213 07/30/19 0417  NA 142 142 140 141  K 3.6 3.9 3.5 3.6  CL 95* 97* 97* 99  CO2 36* 36* 35* 35*  GLUCOSE 155* 99 88 93  BUN 15 13 10  6*  CREATININE 1.00 0.87 0.78 0.76  CALCIUM 8.8* 8.8* 8.6* 8.8*  MG 1.7 1.9  --   --    Liver Function Tests: Recent Labs  Lab 07/27/19 1640 07/28/19 0042  AST 17 14*  ALT 13 11  ALKPHOS 40 37*  BILITOT  0.8 0.9  PROT 6.4* 5.8*  ALBUMIN 3.3* 3.2*   No results for input(s): LIPASE, AMYLASE in the last 168 hours. No results for input(s): AMMONIA in the last 168 hours. CBC: Recent Labs  Lab 07/27/19 1640 07/27/19 1640 07/27/19 2053 07/28/19 0042 07/28/19 1935 07/29/19 0213 07/30/19 0417  WBC 5.1  --   --  5.3  --  4.9 4.0  NEUTROABS 3.5  --   --   --   --  3.2 2.8  HGB 7.3*   < > 7.3* 7.0* 9.3* 8.6* 9.1*  HCT 28.2*   < > 27.4* 26.5* 32.9* 31.3* 32.5*  MCV 83.2  --   --  82.6  --  83.7 83.8  PLT 219  --   --  218  --  220 193   < > = values in this interval not displayed.   Cardiac Enzymes: No results for input(s): CKTOTAL, CKMB, CKMBINDEX, TROPONINI in the last 168 hours. BNP: BNP (last 3 results) Recent Labs    05/10/19 0315 05/11/19 0137 05/12/19 0011  BNP 85.9 136.9* 136.0*    ProBNP (last 3 results) No results for input(s): PROBNP in the last 8760 hours.  CBG: Recent Labs  Lab 07/29/19 1212 07/29/19 1639 07/29/19 2350 07/30/19 0629 07/30/19 1106  GLUCAP 172* 86 106* 88 189*       Signed:  09/27/19, MD [facility] 07/30/2019, 11:25 AM

## 2019-07-31 ENCOUNTER — Encounter: Payer: Self-pay | Admitting: *Deleted

## 2019-07-31 ENCOUNTER — Encounter: Payer: Self-pay | Admitting: Gastroenterology

## 2019-07-31 ENCOUNTER — Other Ambulatory Visit: Payer: Self-pay

## 2019-07-31 LAB — TYPE AND SCREEN
ABO/RH(D): A NEG
Antibody Screen: NEGATIVE
Unit division: 0
Unit division: 0
Unit division: 0
Unit division: 0

## 2019-07-31 LAB — BPAM RBC
Blood Product Expiration Date: 202102172359
Blood Product Expiration Date: 202103022359
Blood Product Expiration Date: 202103022359
Blood Product Expiration Date: 202103022359
ISSUE DATE / TIME: 202102051200
ISSUE DATE / TIME: 202102051430
Unit Type and Rh: 600
Unit Type and Rh: 600
Unit Type and Rh: 600
Unit Type and Rh: 600

## 2019-07-31 LAB — SURGICAL PATHOLOGY

## 2019-08-02 LAB — METHYLMALONIC ACID, SERUM: Methylmalonic Acid, Quantitative: 634 nmol/L — ABNORMAL HIGH (ref 0–378)

## 2019-11-02 IMAGING — CT CT HEAD W/O CM
3 series · 15 of 47 positions shown, 18 images · non-contrast
Comparison: Head CT without contrast 01/30/2018 and earlier.

CLINICAL DATA: 77-year-old female with altered mental status.
Hypoxia.

EXAM:
CT HEAD WITHOUT CONTRAST
TECHNIQUE: Contiguous axial images were obtained from the base of the skull
through the vertex without intravenous contrast.

[Series 2: head wo · axial · 0.47mm/px · z∈[-135,-5]mm · 9 of 32 slices shown, 12 images]
[im 3/32  brain]
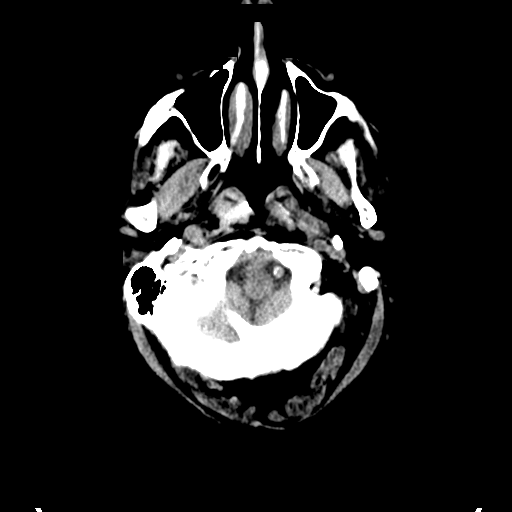
[im 3/32  bone]
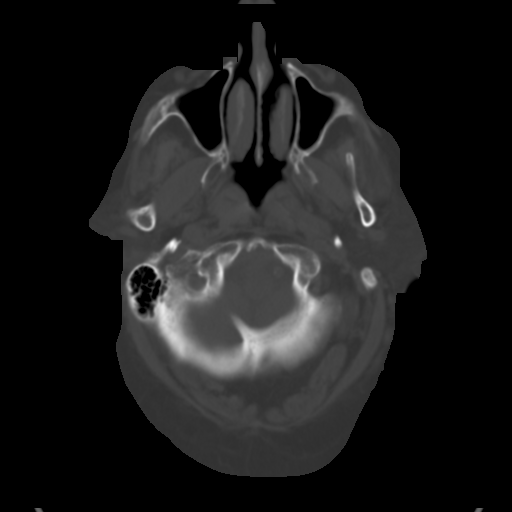
[im 6/32  brain]
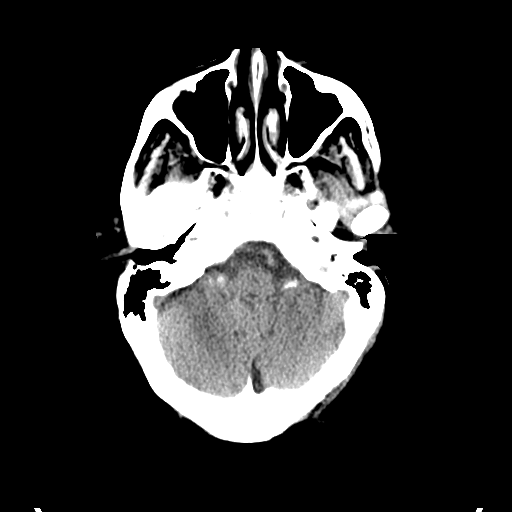
[im 9/32  brain]
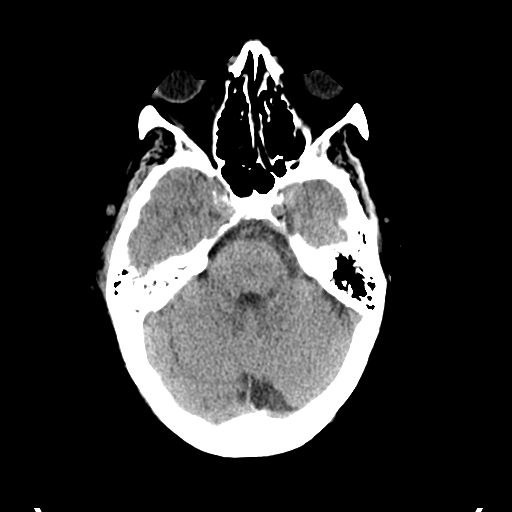
[im 12/32  brain]
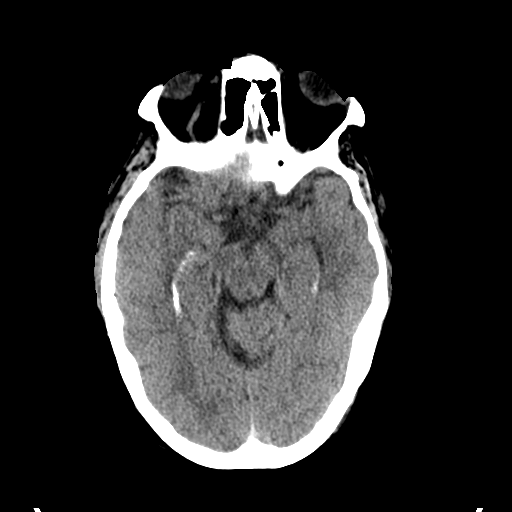
[im 17/32  brain]
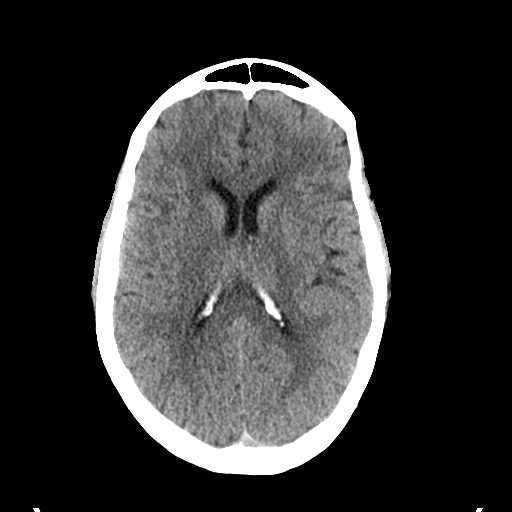
[im 17/32  bone]
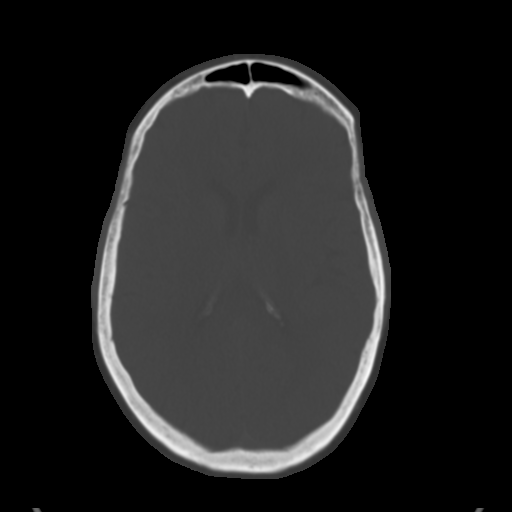
[im 20/32  brain]
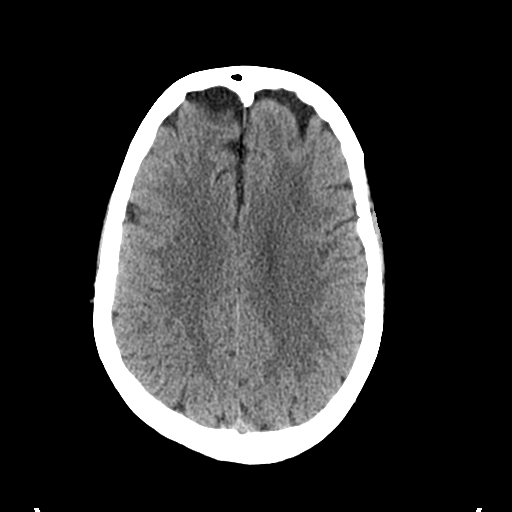
[im 23/32  brain]
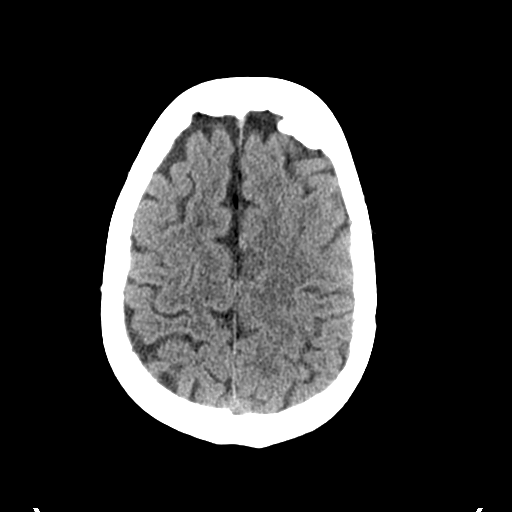
[im 26/32  brain]
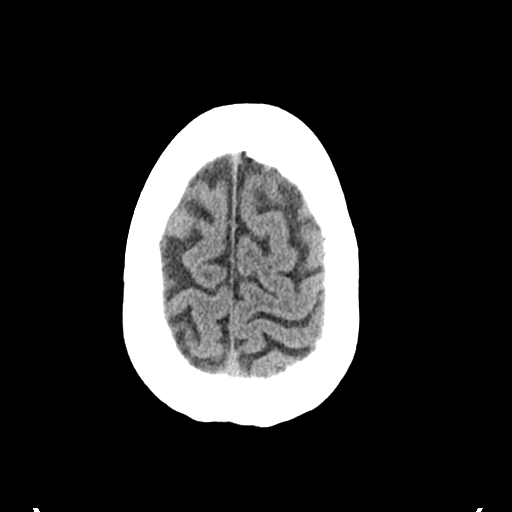
[im 29/32  brain]
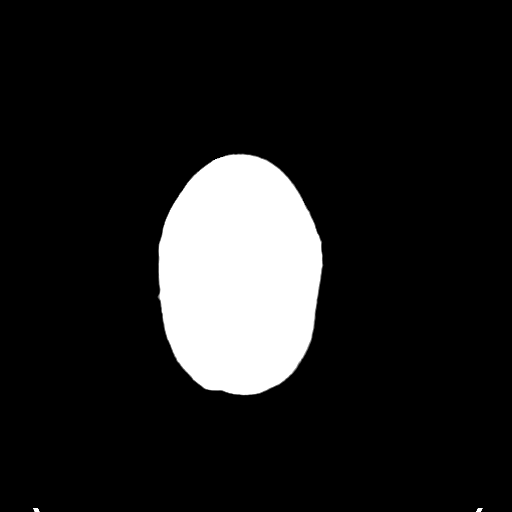
[im 29/32  bone]
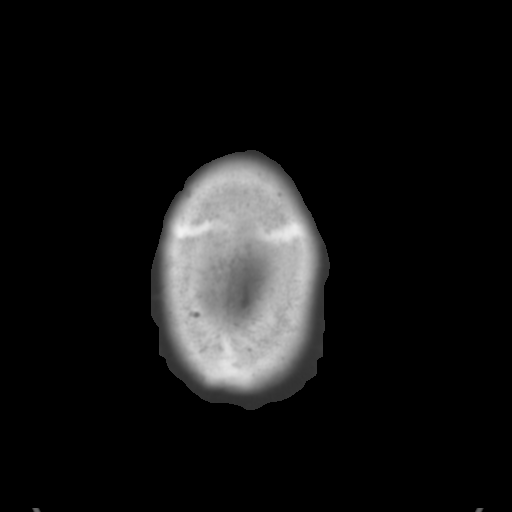

[Series 4: coronal soft tissue · coronal · 0.32mm/px · 3 of 68 slices shown]
[im 23/68  brain]
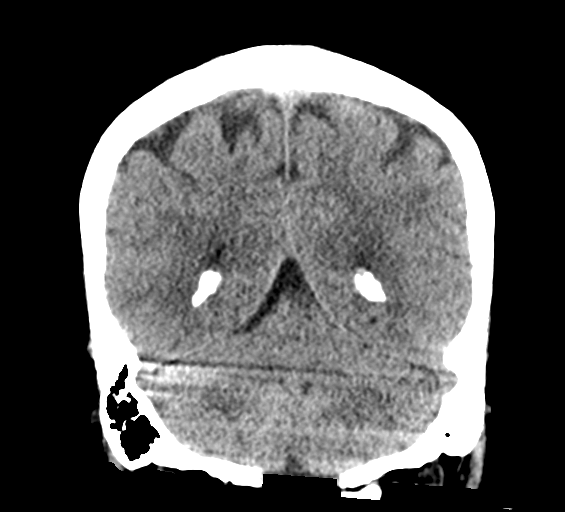
[im 30/68  brain]
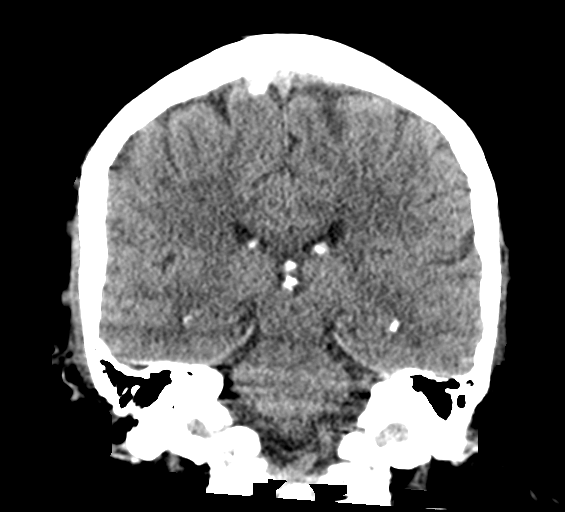
[im 38/68  brain]
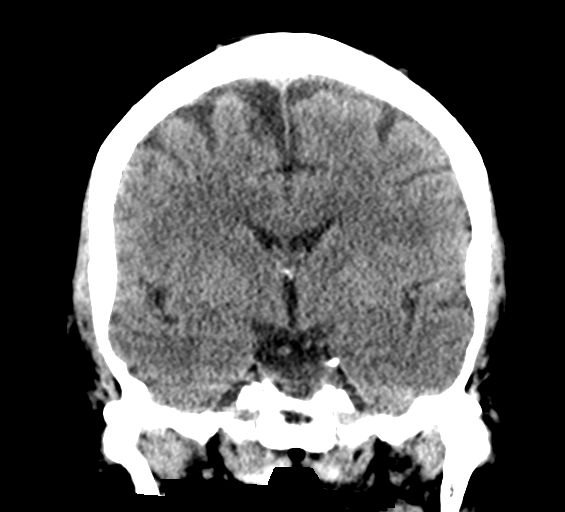

[Series 5: sagittal soft tissue · sagittal · 0.32mm/px · 3 of 52 slices shown]
[im 18/52  brain]
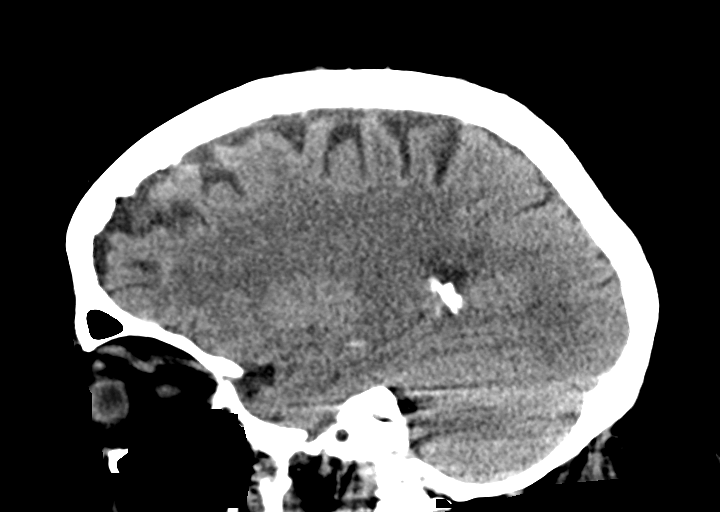
[im 26/52  brain]
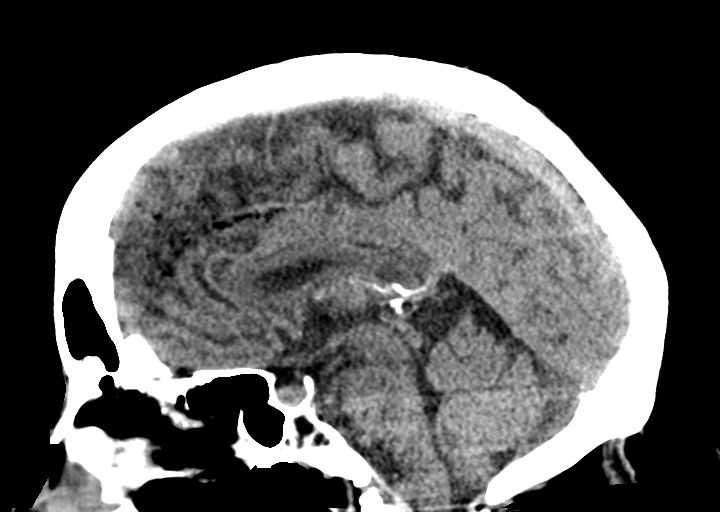
[im 35/52  brain]
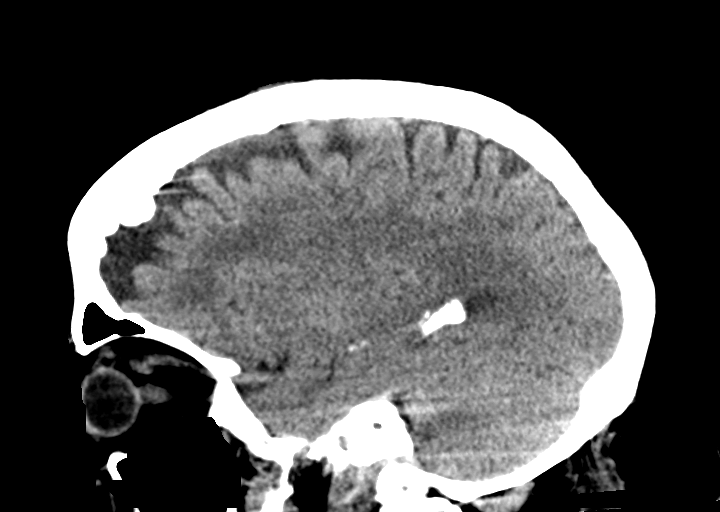

[15 of 47 positions shown; findings below may reference images not displayed]

FINDINGS: Brain: Small right vertex dural calcification versus small 8
millimeter calcified meningioma, stable since 3952. No associated
mass effect or edema. Normal for age cerebral volume and gray-white
matter differentiation.

No midline shift, ventriculomegaly, mass effect, intracranial
hemorrhage or evidence of cortically based acute infarction.

Vascular: Mild Calcified atherosclerosis at the skull base. No
suspicious intracranial vascular hyperdensity.

Skull: No acute osseous abnormality identified. Hyperostosis
frontalis (normal variant).

Sinuses/Orbits: Visualized paranasal sinuses and mastoids are stable
and well pneumatized.

Other: No acute orbit or scalp soft tissue findings.
IMPRESSION: Stable and largely normal for age noncontrast CT appearance of the
brain; unchanged possible small chronic right vertex calcified
meningioma.

## 2020-05-22 DEATH — deceased
